# Patient Record
Sex: Female | Born: 1975 | Race: White | Hispanic: No | Marital: Married | Smoking: Never smoker
Health system: Southern US, Community
[De-identification: ages and names within clinical notes are randomized; demographics above are authoritative.]

## PROBLEM LIST (undated history)

## (undated) DIAGNOSIS — K219 Gastro-esophageal reflux disease without esophagitis: Secondary | ICD-10-CM

## (undated) DIAGNOSIS — F329 Major depressive disorder, single episode, unspecified: Secondary | ICD-10-CM

## (undated) DIAGNOSIS — F32A Depression, unspecified: Secondary | ICD-10-CM

---

## 2007-04-09 ENCOUNTER — Other Ambulatory Visit: Admission: RE | Admit: 2007-04-09 | Discharge: 2007-04-09 | Payer: Self-pay | Admitting: Obstetrics and Gynecology

## 2007-07-09 ENCOUNTER — Encounter: Admission: RE | Admit: 2007-07-09 | Discharge: 2007-07-09 | Payer: Self-pay | Admitting: Obstetrics and Gynecology

## 2008-05-15 ENCOUNTER — Other Ambulatory Visit: Admission: RE | Admit: 2008-05-15 | Discharge: 2008-05-15 | Payer: Self-pay | Admitting: Obstetrics and Gynecology

## 2009-05-20 ENCOUNTER — Other Ambulatory Visit: Admission: RE | Admit: 2009-05-20 | Discharge: 2009-05-20 | Payer: Self-pay | Admitting: Obstetrics and Gynecology

## 2010-05-24 ENCOUNTER — Other Ambulatory Visit: Admission: RE | Admit: 2010-05-24 | Discharge: 2010-05-24 | Payer: Self-pay | Admitting: Obstetrics and Gynecology

## 2015-09-01 MED FILL — FETZIMA ER 120 MG CAPSULE: 120 | 30 days supply | Qty: 30 | Fill #2

## 2015-09-01 MED FILL — traZODone HCL 50 MG TABS: 50 | 30 days supply | Qty: 60 | Fill #0

## 2015-09-20 MED FILL — BUPROPION HCL XL 150 MG TAB: 150 | 30 days supply | Qty: 30 | Fill #0

## 2015-10-04 MED FILL — FETZIMA ER 120 MG CAPSULE: 120 | 30 days supply | Qty: 30 | Fill #3

## 2015-10-19 MED FILL — BUPROPION HCL XL 150 MG TAB: 150 | 30 days supply | Qty: 30 | Fill #1

## 2015-10-26 DIAGNOSIS — F331 Major depressive disorder, recurrent, moderate: Secondary | ICD-10-CM | POA: Diagnosis not present

## 2015-10-26 DIAGNOSIS — F900 Attention-deficit hyperactivity disorder, predominantly inattentive type: Secondary | ICD-10-CM | POA: Diagnosis not present

## 2015-10-26 DIAGNOSIS — F422 Mixed obsessional thoughts and acts: Secondary | ICD-10-CM | POA: Diagnosis not present

## 2015-10-29 DIAGNOSIS — E039 Hypothyroidism, unspecified: Secondary | ICD-10-CM | POA: Diagnosis not present

## 2015-11-02 MED FILL — traZODone HCL 50 MG TABS: 50 | 30 days supply | Qty: 60 | Fill #1

## 2015-11-02 MED FILL — FETZIMA ER 120 MG CAPSULE: 120 | 30 days supply | Qty: 30 | Fill #0

## 2015-11-09 DIAGNOSIS — H52223 Regular astigmatism, bilateral: Secondary | ICD-10-CM | POA: Diagnosis not present

## 2015-11-09 DIAGNOSIS — H5211 Myopia, right eye: Secondary | ICD-10-CM | POA: Diagnosis not present

## 2015-11-16 DIAGNOSIS — Z6827 Body mass index (BMI) 27.0-27.9, adult: Secondary | ICD-10-CM | POA: Diagnosis not present

## 2015-11-16 DIAGNOSIS — Z01419 Encounter for gynecological examination (general) (routine) without abnormal findings: Secondary | ICD-10-CM | POA: Diagnosis not present

## 2015-11-17 MED FILL — TRANSDERM-SCOP 1.5 MG/72HR: 1 | 9 days supply | Qty: 3 | Fill #0

## 2015-11-18 MED FILL — BUPROPION HCL XL 150 MG TAB: 150 | 30 days supply | Qty: 30 | Fill #2

## 2015-11-25 DIAGNOSIS — Z1329 Encounter for screening for other suspected endocrine disorder: Secondary | ICD-10-CM | POA: Diagnosis not present

## 2015-11-25 DIAGNOSIS — Z1321 Encounter for screening for nutritional disorder: Secondary | ICD-10-CM | POA: Diagnosis not present

## 2015-11-25 DIAGNOSIS — Z1231 Encounter for screening mammogram for malignant neoplasm of breast: Secondary | ICD-10-CM | POA: Diagnosis not present

## 2015-11-25 DIAGNOSIS — Z131 Encounter for screening for diabetes mellitus: Secondary | ICD-10-CM | POA: Diagnosis not present

## 2015-11-25 DIAGNOSIS — Z13220 Encounter for screening for lipoid disorders: Secondary | ICD-10-CM | POA: Diagnosis not present

## 2015-11-25 MED FILL — FETZIMA ER 120 MG CAPSULE: 120 | 30 days supply | Qty: 30 | Fill #1

## 2015-12-22 DIAGNOSIS — K6289 Other specified diseases of anus and rectum: Secondary | ICD-10-CM | POA: Diagnosis not present

## 2015-12-22 MED FILL — BUPROPION HCL XL 150 MG TAB: 150 | 30 days supply | Qty: 30 | Fill #3

## 2015-12-30 MED FILL — FETZIMA ER 120 MG CAPSULE: 120 | 30 days supply | Qty: 30 | Fill #2

## 2015-12-30 MED FILL — traZODone HCL 50 MG TABS: 50 | 30 days supply | Qty: 60 | Fill #2

## 2016-01-18 DIAGNOSIS — L700 Acne vulgaris: Secondary | ICD-10-CM | POA: Diagnosis not present

## 2016-01-18 DIAGNOSIS — L821 Other seborrheic keratosis: Secondary | ICD-10-CM | POA: Diagnosis not present

## 2016-01-18 MED FILL — metroNIDAZOLE 1 % GEL: 1 | 30 days supply | Qty: 60 | Fill #0

## 2016-01-18 MED FILL — AMPICILLIN TR 500 MG CAP: 500 | 30 days supply | Qty: 60 | Fill #0

## 2016-01-25 MED FILL — BUPROPION HCL XL 150 MG TAB: 150 | 30 days supply | Qty: 30 | Fill #0

## 2016-02-03 DIAGNOSIS — F900 Attention-deficit hyperactivity disorder, predominantly inattentive type: Secondary | ICD-10-CM | POA: Diagnosis not present

## 2016-02-03 DIAGNOSIS — F331 Major depressive disorder, recurrent, moderate: Secondary | ICD-10-CM | POA: Diagnosis not present

## 2016-02-03 DIAGNOSIS — F422 Mixed obsessional thoughts and acts: Secondary | ICD-10-CM | POA: Diagnosis not present

## 2016-02-16 MED FILL — AMPICILLIN TR 500 MG CAP: 500 | 30 days supply | Qty: 60 | Fill #1

## 2016-02-16 MED FILL — traZODone HCL 50 MG TABS: 50 | 30 days supply | Qty: 60 | Fill #3

## 2016-02-23 MED FILL — BUPROPION HCL XL 150 MG TAB: 150 | 30 days supply | Qty: 30 | Fill #1

## 2016-03-23 MED FILL — BUPROPION HCL XL 150 MG TAB: 150 | 30 days supply | Qty: 30 | Fill #2

## 2016-04-18 MED FILL — SCOPOLAMINE 1 MG/3 DAY PATC: 1 | 12 days supply | Qty: 4 | Fill #0

## 2016-04-19 MED FILL — traZODone HCL 50 MG TABS: 50 | 30 days supply | Qty: 60 | Fill #0

## 2016-04-19 MED FILL — BUPROPION HCL XL 150 MG TAB: 150 | 30 days supply | Qty: 30 | Fill #3

## 2016-04-19 MED FILL — FETZIMA ER 120 MG CAPSULE: 120 | 30 days supply | Qty: 30 | Fill #3

## 2016-05-02 DIAGNOSIS — L603 Nail dystrophy: Secondary | ICD-10-CM | POA: Diagnosis not present

## 2016-05-12 MED FILL — DICLOFENAC SODIUM 1% GEL: 1 | 20 days supply | Qty: 100 | Fill #0

## 2016-05-16 DIAGNOSIS — L700 Acne vulgaris: Secondary | ICD-10-CM | POA: Diagnosis not present

## 2016-05-16 DIAGNOSIS — D2371 Other benign neoplasm of skin of right lower limb, including hip: Secondary | ICD-10-CM | POA: Diagnosis not present

## 2016-05-16 DIAGNOSIS — D2372 Other benign neoplasm of skin of left lower limb, including hip: Secondary | ICD-10-CM | POA: Diagnosis not present

## 2016-05-16 DIAGNOSIS — D2261 Melanocytic nevi of right upper limb, including shoulder: Secondary | ICD-10-CM | POA: Diagnosis not present

## 2016-05-16 DIAGNOSIS — D225 Melanocytic nevi of trunk: Secondary | ICD-10-CM | POA: Diagnosis not present

## 2016-05-16 DIAGNOSIS — D2272 Melanocytic nevi of left lower limb, including hip: Secondary | ICD-10-CM | POA: Diagnosis not present

## 2016-05-16 DIAGNOSIS — L812 Freckles: Secondary | ICD-10-CM | POA: Diagnosis not present

## 2016-05-16 DIAGNOSIS — D2361 Other benign neoplasm of skin of right upper limb, including shoulder: Secondary | ICD-10-CM | POA: Diagnosis not present

## 2016-05-16 DIAGNOSIS — D2262 Melanocytic nevi of left upper limb, including shoulder: Secondary | ICD-10-CM | POA: Diagnosis not present

## 2016-05-16 MED FILL — SPIRONOLACTONE 25 MG TABLET: 25 | 37 days supply | Qty: 60 | Fill #0

## 2016-05-17 DIAGNOSIS — F331 Major depressive disorder, recurrent, moderate: Secondary | ICD-10-CM | POA: Diagnosis not present

## 2016-05-17 DIAGNOSIS — F900 Attention-deficit hyperactivity disorder, predominantly inattentive type: Secondary | ICD-10-CM | POA: Diagnosis not present

## 2016-05-17 DIAGNOSIS — F422 Mixed obsessional thoughts and acts: Secondary | ICD-10-CM | POA: Diagnosis not present

## 2016-06-19 MED FILL — traZODone HCL 50 MG TABS: 50 | 30 days supply | Qty: 60 | Fill #1

## 2016-06-19 MED FILL — SPIRONOLACTONE 25 MG TABLET: 25 | 37 days supply | Qty: 60 | Fill #1

## 2016-06-19 MED FILL — BUPROPION HCL XL 150 MG TAB: 150 | 30 days supply | Qty: 30 | Fill #0

## 2016-06-19 MED FILL — FETZIMA ER 120 MG CAPSULE: 120 | 30 days supply | Qty: 30 | Fill #0

## 2016-07-20 DIAGNOSIS — F900 Attention-deficit hyperactivity disorder, predominantly inattentive type: Secondary | ICD-10-CM | POA: Diagnosis not present

## 2016-07-20 DIAGNOSIS — F331 Major depressive disorder, recurrent, moderate: Secondary | ICD-10-CM | POA: Diagnosis not present

## 2016-07-20 DIAGNOSIS — F422 Mixed obsessional thoughts and acts: Secondary | ICD-10-CM | POA: Diagnosis not present

## 2016-07-24 MED FILL — SPIRONOLACTONE 25 MG TABLET: 25 | 37 days supply | Qty: 60 | Fill #2

## 2016-07-24 MED FILL — FETZIMA ER 120 MG CAPSULE: 120 | 30 days supply | Qty: 30 | Fill #1

## 2016-07-24 MED FILL — BUPROPION HCL XL 150 MG TAB: 150 | 30 days supply | Qty: 30 | Fill #1

## 2016-08-24 ENCOUNTER — Ambulatory Visit (INDEPENDENT_AMBULATORY_CARE_PROVIDER_SITE_OTHER): Payer: Self-pay | Admitting: Sports Medicine

## 2016-08-24 ENCOUNTER — Ambulatory Visit (INDEPENDENT_AMBULATORY_CARE_PROVIDER_SITE_OTHER): Payer: Self-pay

## 2016-08-24 ENCOUNTER — Encounter (INDEPENDENT_AMBULATORY_CARE_PROVIDER_SITE_OTHER): Payer: Self-pay

## 2016-08-24 ENCOUNTER — Encounter (INDEPENDENT_AMBULATORY_CARE_PROVIDER_SITE_OTHER): Payer: Self-pay | Admitting: Orthopaedic Surgery

## 2016-08-24 ENCOUNTER — Ambulatory Visit (INDEPENDENT_AMBULATORY_CARE_PROVIDER_SITE_OTHER): Payer: 59 | Admitting: Orthopaedic Surgery

## 2016-08-24 DIAGNOSIS — M79672 Pain in left foot: Secondary | ICD-10-CM | POA: Diagnosis not present

## 2016-08-24 DIAGNOSIS — M25521 Pain in right elbow: Secondary | ICD-10-CM | POA: Diagnosis not present

## 2016-08-24 DIAGNOSIS — M79671 Pain in right foot: Secondary | ICD-10-CM | POA: Diagnosis not present

## 2016-08-24 MED ORDER — LIDOCAINE HCL 1 % IJ SOLN
1.0000 mL | INTRAMUSCULAR | Status: AC | PRN
  Administered 2016-08-24: 1 mL

## 2016-08-24 MED ORDER — DICLOFENAC SODIUM 1 % TD GEL: 2.0000 g | Freq: Four times a day (QID) | TRANSDERMAL | 5 refills | Status: DC

## 2016-08-24 MED ORDER — METHYLPREDNISOLONE ACETATE 40 MG/ML IJ SUSP
40.0000 mg | INTRAMUSCULAR | Status: AC | PRN
  Administered 2016-08-24: 40 mg

## 2016-08-24 MED ORDER — BUPIVACAINE HCL 0.5 % IJ SOLN
1.0000 mL | INTRAMUSCULAR | Status: AC | PRN
  Administered 2016-08-24: 1 mL

## 2016-08-24 MED FILL — BUPROPION HCL XL 150 MG TAB: 150 | 30 days supply | Qty: 30 | Fill #2

## 2016-08-24 MED FILL — traZODone HCL 50 MG TABS: 50 | 30 days supply | Qty: 60 | Fill #2

## 2016-08-24 NOTE — Progress Notes (Signed)
Office Visit Note   Patient: Robin Stevenson           Date of Birth: 05-19-1976           MRN: [id number] Visit Date: 08/24/2016              Requested by: No referring provider defined for this encounter. PCP: Robin Shelling, MD   Assessment & Plan: Visit Diagnoses:  1. Pain in right elbow   2. 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 We did perform an injection today under sterile conditions. I also gave her information to read about the condition. I want her to take 4 weeks off from tennis. Also want her to look into using a smaller grip size with a racquet. Continue with ice. Wears a body helix elbow brace. In terms of her peroneal tubercles I think the only treatment that would be long lasting with be for surgical resection. I think she should try to manage it with shoe modification and with padding which she is more interested in anyways. She does not want to pursue any sort of surgical treatment. voltaren gel was also prescribed for the elbow. I will see her back as needed. Total face to face encounter time was greater than 45 minutes and over half of this time was spent in counseling and/or coordination of care.    Follow-Up Instructions: No Follow-up on file.   Orders:  Orders Placed This Encounter  Procedures  . XR Elbow 2 Views Right   Meds ordered this encounter  Medications  . diclofenac sodium (VOLTAREN) 1 % GEL    Sig: Apply 2 g topically 4 (four) times daily.    Dispense:  1 Tube    Refill:  5      Procedures: Hand/UE Inj Date/Time: 08/24/2016 9:00 PM Performed by: Leandrew Koyanagi Authorized by: Leandrew Koyanagi   Consent Given by:  Patient Timeout: prior to procedure the correct patient, procedure, and site was verified   Indications:  Pain Condition: lateral epicondylitis   Site:  R elbow Prep: patient was prepped and draped in usual sterile fashion   Needle Size:  25 G Medications:   1 mL lidocaine 1 %; 1 mL bupivacaine 0.5 %; 40 mg methylPREDNISolone acetate 40 MG/ML     Clinical Data: No additional findings.   Subjective: Chief Complaint  Patient presents with  . Right Elbow - Pain  . Right Foot - Pain  . Left Foot - Pain    The patient is a 41 year old female who is the wife of Robin Stevenson of cardiology comes in for multiple complaints. First one is right tennis elbow. She has had this problem for 2-3 years and has had recent worsening in the last 3-4 weeks. She is an avid Firefighter. She is had good relief from dry needling with physical therapy  is not had any injections. She states the pain is worse with playing tennis. She did switch to a heavier racquet recently. In terms of her feet she has prominent bilateral peroneal tubercles which do make it irritating to wear shoes. They are sore and they are slightly enlarged. It's worse on the right. The pain does not radiate. Denies any injuries. Denies any ulcers.      Review of Systems Complete review of systems is negative except for history of present illness  Objective: Vital Signs: There were no vitals taken for this  visit.  Physical Exam Well-developed nourished acute distress alert 3 nonlabored breathing normal judgments affect Ortho Exam Exam of her right elbow shows tenderness to palpation over the lateral upper condyle. Negative Tinel at radial tunnel. Radial tunnel is nontender. No focal motor or sensory deficits. Pain with resisted wrist extension. No pain with resisted middle finger extension. No medial sided elbow symptoms. Full range of motion of the elbow. Exam of bilateral feet shows prominent peroneal tubercles. Peroneal tendons are nontender. There are slightly red from mechanical irritation. Specialty Comments:  No specialty comments available.  Imaging: Xr Elbow 2 Views Right  Result Date: 08/24/2016 No acute findings    PMFS History: There are no active problems to display for  this patient.  No past medical history on file.  No family history on file.  No past surgical history on file. Social History   Occupational History  . Not on file.   Social History Main Topics  . Smoking status: Never Smoker  . Smokeless tobacco: Never Used  . Alcohol use Not on file  . Drug use: Unknown  . Sexual activity: Not on file

## 2016-08-25 MED FILL — FETZIMA ER 120 MG CAPSULE: 120 | 30 days supply | Qty: 30 | Fill #2

## 2016-08-31 ENCOUNTER — Telehealth: Payer: Self-pay | Admitting: Gastroenterology

## 2016-08-31 DIAGNOSIS — R112 Nausea with vomiting, unspecified: Secondary | ICD-10-CM

## 2016-08-31 DIAGNOSIS — R1013 Epigastric pain: Secondary | ICD-10-CM

## 2016-08-31 NOTE — Telephone Encounter (Signed)
Eustaquio Maize is a friend of mine.  She had a gastroenteritis, seems viral, last week. Last night colicy epigastric abd pains, radiated to the back, + nausea and some diaphoresis.  Mainly better by this morning but some lingering discomfort. Takes NSAIDs around her period which was about a week ago.    She's going to take H2 blocker tonight at bedtime, PPI in AM.  I'm concerned about possible biliary colic.  Sheri, She's coming to the lab tomorrow AM (friday); can you put her in for cbc, cmet, lipase, amylase.  Also [place] abd (hopefully tomorrow), she's going to remain relatively NPO in case [place] can be tomorrow.  Thanks  Her cell is [phone]

## 2016-09-01 ENCOUNTER — Other Ambulatory Visit (INDEPENDENT_AMBULATORY_CARE_PROVIDER_SITE_OTHER): Payer: 59

## 2016-09-01 ENCOUNTER — Ambulatory Visit (HOSPITAL_COMMUNITY)
Admission: RE | Admit: 2016-09-01 | Discharge: 2016-09-01 | Disposition: A | Discharge status: Home / Self Care | Payer: 59 | Source: Ambulatory Visit | Attending: Gastroenterology | Admitting: Gastroenterology

## 2016-09-01 DIAGNOSIS — R1013 Epigastric pain: Secondary | ICD-10-CM | POA: Diagnosis not present

## 2016-09-01 DIAGNOSIS — R112 Nausea with vomiting, unspecified: Secondary | ICD-10-CM | POA: Diagnosis not present

## 2016-09-01 LAB — CBC WITH DIFFERENTIAL/PLATELET
Basophils Absolute: 0 10*3/uL (ref 0.0–0.1)
Basophils Relative: 0.7 % (ref 0.0–3.0)
Eosinophils Absolute: 0.1 10*3/uL (ref 0.0–0.7)
Eosinophils Relative: 2 % (ref 0.0–5.0)
HCT: 38.9 % (ref 36.0–46.0)
Hemoglobin: 13.5 g/dL (ref 12.0–15.0)
Lymphocytes Relative: 29.2 % (ref 12.0–46.0)
Lymphs Abs: 1.5 10*3/uL (ref 0.7–4.0)
MCHC: 34.6 g/dL (ref 30.0–36.0)
MCV: 90.4 fl (ref 78.0–100.0)
Monocytes Absolute: 0.4 10*3/uL (ref 0.1–1.0)
Monocytes Relative: 7.6 % (ref 3.0–12.0)
Neutro Abs: 3.1 10*3/uL (ref 1.4–7.7)
Neutrophils Relative %: 60.5 % (ref 43.0–77.0)
Platelets: 286 10*3/uL (ref 150.0–400.0)
RBC: 4.3 Mil/uL (ref 3.87–5.11)
RDW: 11.9 % (ref 11.5–15.5)
WBC: 5.1 10*3/uL (ref 4.0–10.5)

## 2016-09-01 LAB — COMPREHENSIVE METABOLIC PANEL
ALT: 70 U/L — ABNORMAL HIGH (ref 0–35)
AST: 33 U/L (ref 0–37)
Albumin: 4.3 g/dL (ref 3.5–5.2)
Alkaline Phosphatase: 66 U/L (ref 39–117)
BUN: 14 mg/dL (ref 6–23)
CO2: 31 mEq/L (ref 19–32)
Calcium: 9.2 mg/dL (ref 8.4–10.5)
Chloride: 104 mEq/L (ref 96–112)
Creatinine, Ser: 0.86 mg/dL (ref 0.40–1.20)
GFR: 77.19 mL/min (ref >60.00)
Glucose, Bld: 90 mg/dL (ref 70–99)
Potassium: 4.4 mEq/L (ref 3.5–5.1)
Sodium: 140 mEq/L (ref 135–145)
Total Bilirubin: 0.7 mg/dL (ref 0.2–1.2)
Total Protein: 7 g/dL (ref 6.0–8.3)

## 2016-09-01 LAB — LIPASE: LIPASE: 57 U/L (ref 11.0–59.0)

## 2016-09-01 LAB — AMYLASE: Amylase: 41 U/L (ref 27–131)

## 2016-09-01 NOTE — Telephone Encounter (Signed)
Patient notified of the [place] scheduled for today at [facility] she is notified to arrive at 11:45 and remain NPO

## 2016-09-08 ENCOUNTER — Telehealth: Payer: Self-pay

## 2016-09-08 NOTE — Telephone Encounter (Signed)
Per Dr. Radford Pax, patient has no OSA but significant snoring.  Patient will be referred to Dr. Toy Cookey to evaluate for oral device.  Referral sent to fax :[phone].

## 2016-09-11 ENCOUNTER — Telehealth: Payer: Self-pay | Admitting: *Deleted

## 2016-09-11 ENCOUNTER — Other Ambulatory Visit: Payer: Self-pay

## 2016-09-11 DIAGNOSIS — R945 Abnormal results of liver function studies: Secondary | ICD-10-CM

## 2016-09-11 DIAGNOSIS — R7989 Other specified abnormal findings of blood chemistry: Secondary | ICD-10-CM

## 2016-09-11 NOTE — Telephone Encounter (Signed)
I called dr Betsey Holiday office and spoke to [place] to check on the referral from dr turner for her oral device, the office informed me they got the referral today and were waiting on the patient to call and make her appointment. 09/11/16

## 2016-09-11 NOTE — Telephone Encounter (Signed)
I called dr Betsey Holiday office and spoke to [place] to check on the referral from dr turner for her oral device, the office informed me they got the referral today and were waiting on the patient to call and make her appointment. 09/11/16

## 2016-09-27 MED FILL — OSELTAMIVIR PHOS 75 MG CAP: 75 | 5 days supply | Qty: 10 | Fill #0

## 2016-09-28 MED FILL — BUPROPION HCL XL 150 MG TAB: 150 | 30 days supply | Qty: 30 | Fill #3

## 2016-09-28 MED FILL — FETZIMA ER 120 MG CAPSULE: 120 | 30 days supply | Qty: 30 | Fill #3

## 2016-10-19 DIAGNOSIS — F422 Mixed obsessional thoughts and acts: Secondary | ICD-10-CM | POA: Diagnosis not present

## 2016-10-19 DIAGNOSIS — F331 Major depressive disorder, recurrent, moderate: Secondary | ICD-10-CM | POA: Diagnosis not present

## 2016-10-19 DIAGNOSIS — F900 Attention-deficit hyperactivity disorder, predominantly inattentive type: Secondary | ICD-10-CM | POA: Diagnosis not present

## 2016-10-26 MED FILL — BUPROPION HCL XL 150 MG TAB: 150 | 30 days supply | Qty: 30 | Fill #0

## 2016-10-26 MED FILL — FETZIMA ER 120 MG CAPSULE: 120 | 30 days supply | Qty: 30 | Fill #0

## 2016-11-02 MED FILL — traZODone HCL 50 MG TABS: 50 | 30 days supply | Qty: 60 | Fill #0

## 2016-11-27 MED FILL — BUPROPION HCL XL 150 MG TAB: 150 | 30 days supply | Qty: 30 | Fill #1

## 2016-11-27 MED FILL — FETZIMA ER 120 MG CAPSULE: 120 | 30 days supply | Qty: 30 | Fill #1

## 2016-11-30 DIAGNOSIS — Z1231 Encounter for screening mammogram for malignant neoplasm of breast: Secondary | ICD-10-CM | POA: Diagnosis not present

## 2016-11-30 DIAGNOSIS — Z6829 Body mass index (BMI) 29.0-29.9, adult: Secondary | ICD-10-CM | POA: Diagnosis not present

## 2016-11-30 DIAGNOSIS — Z01419 Encounter for gynecological examination (general) (routine) without abnormal findings: Secondary | ICD-10-CM | POA: Diagnosis not present

## 2016-11-30 MED FILL — CYCLOBENZAPRINE 5 MG TABLET: 5 | 5 days supply | Qty: 20 | Fill #0

## 2016-12-04 ENCOUNTER — Encounter (INDEPENDENT_AMBULATORY_CARE_PROVIDER_SITE_OTHER): Payer: Self-pay | Admitting: Orthopaedic Surgery

## 2016-12-04 ENCOUNTER — Ambulatory Visit (INDEPENDENT_AMBULATORY_CARE_PROVIDER_SITE_OTHER): Payer: 59 | Admitting: Orthopaedic Surgery

## 2016-12-04 DIAGNOSIS — M7711 Lateral epicondylitis, right elbow: Secondary | ICD-10-CM | POA: Insufficient documentation

## 2016-12-04 MED ORDER — METHOCARBAMOL 500 MG PO TABS
500.0000 mg | ORAL_TABLET | Freq: Four times a day (QID) | ORAL | 2 refills | Status: DC | PRN
Start: 2016-12-04 — End: 2017-04-30

## 2016-12-04 MED ORDER — METHYLPREDNISOLONE ACETATE 40 MG/ML IJ SUSP
40.0000 mg | INTRAMUSCULAR | Status: AC | PRN
  Administered 2016-12-04: 40 mg

## 2016-12-04 MED ORDER — BUPIVACAINE HCL 0.5 % IJ SOLN
1.0000 mL | INTRAMUSCULAR | Status: AC | PRN
  Administered 2016-12-04: 1 mL

## 2016-12-04 MED ORDER — LIDOCAINE HCL 1 % IJ SOLN
1.0000 mL | INTRAMUSCULAR | Status: AC | PRN
  Administered 2016-12-04: 1 mL

## 2016-12-04 MED FILL — METHOCARBAMOL 500 MG TABLET: 500 | 8 days supply | Qty: 30 | Fill #0

## 2016-12-04 NOTE — Progress Notes (Signed)
   Office Visit Note   Patient: Robin Stevenson           Date of Birth: 05/16/1976           MRN: [id number] Visit Date: 12/04/2016              Requested by: Robin Orn, MD [address] [place], [place] [address] PCP: Robin Shelling, MD   Assessment & Plan: Visit Diagnoses:  1. Lateral epicondylitis, right elbow     Plan: Repeat cortisone injection was given into the lateral upper condyle and the extensor tendon attachment. I recommended relative rest. Pennsaid sample was given today. I did give her prescription for Robaxin to see if this will be less sedating for her. Continue ice and NSAIDs especially around the time she is playing tennis. Follow-up with me as needed.  Follow-Up Instructions: Return if symptoms worsen or fail to improve.   Orders:  No orders of the defined types were placed in this encounter.  Meds ordered this encounter  Medications  . methocarbamol (ROBAXIN) 500 MG tablet    Sig: Take 1 tablet (500 mg total) by mouth every 6 (six) hours as needed for muscle spasms.    Dispense:  30 tablet    Refill:  2      Procedures: Hand/UE Inj Date/Time: 12/04/2016 4:58 PM Performed by: Robin Stevenson Authorized by: Robin Stevenson   Consent Given by:  Patient Timeout: prior to procedure the correct patient, procedure, and site was verified   Indications:  Pain Condition: lateral epicondylitis   Site:  R elbow Prep: patient was prepped and draped in usual sterile fashion   Needle Size:  25 G Medications:  1 mL lidocaine 1 %; 1 mL bupivacaine 0.5 %; 40 mg methylPREDNISolone acetate 40 MG/ML     Clinical Data: No additional findings.   Subjective: Chief Complaint  Patient presents with  . Right Elbow - Pain, Follow-up    [name] follows up today for her right elbow pain. She did get an injection about 3 months ago and has helped tremendously. She still complains of just one area of pain directly over the lateral epicondyle. She  denies any pain into her forearm anymore. She wears a body helix elbow brace while playing tennis. She does take anti-inflammatories as needed. She is also complaining of some back pain. Flexeril makes her too drowsy.    Review of Systems   Objective: Vital Signs: There were no vitals taken for this visit.  Physical Exam  Ortho Exam Right elbow exam shows point tenderness over the lateral epicondyle. Radial head is nontender. He has full pronation supination. Collateral ligaments are normal. Specialty Comments:  No specialty comments available.  Imaging: No results found.   PMFS History: Patient Active Problem List   Diagnosis Date Noted  . Lateral epicondylitis, right elbow 12/04/2016   No past medical history on file.  No family history on file.  No past surgical history on file. Social History   Occupational History  . Not on file.   Social History Main Topics  . Smoking status: Never Smoker  . Smokeless tobacco: Never Used  . Alcohol use Not on file  . Drug use: Unknown  . Sexual activity: Not on file

## 2016-12-27 ENCOUNTER — Ambulatory Visit (INDEPENDENT_AMBULATORY_CARE_PROVIDER_SITE_OTHER): Payer: 59

## 2016-12-27 ENCOUNTER — Ambulatory Visit (INDEPENDENT_AMBULATORY_CARE_PROVIDER_SITE_OTHER): Payer: 59 | Admitting: Family

## 2016-12-27 DIAGNOSIS — M25521 Pain in right elbow: Secondary | ICD-10-CM

## 2016-12-27 DIAGNOSIS — M7711 Lateral epicondylitis, right elbow: Secondary | ICD-10-CM | POA: Diagnosis not present

## 2016-12-27 MED FILL — FETZIMA ER 120 MG CAPSULE: 120 | 30 days supply | Qty: 30 | Fill #2

## 2016-12-27 MED FILL — BUPROPION HCL XL 150 MG TAB: 150 | 30 days supply | Qty: 30 | Fill #2

## 2016-12-27 MED FILL — traZODone HCL 50 MG TABS: 50 | 30 days supply | Qty: 60 | Fill #1

## 2016-12-27 NOTE — Progress Notes (Signed)
Office Visit Note   Patient: Robin Stevenson           Date of Birth: [birth year]           MRN: [id number] Visit Date: 12/27/2016              Requested by: Lavone Orn, MD [address] [place], [place] [address] PCP: Lavone Orn, MD  No chief complaint on file.     HPI: The patient is a 41 year old woman who presents today for complaining of right elbow pain. States that she has had lateral epicondylitis, tennis elbow for the last 4 years off and on. Had a steroid injection in January of 1 shoe this provided good interval relief. However did have return of her symptoms. She had a subsequent steroid injection in April. States this did not provide as much relief as the last injection. States yesterday she was playing tennis when she did a backhand. Did use both hands. States this is normally her strongest move. Him felt something pop and heard a pop in her right elbow. Has complained of weakness with weightbearing and extension of her wrist since yesterday. States she was able to finish her match.  Assessment & Plan: Visit Diagnoses:  1. Lateral epicondylitis, right elbow   2. Pain in right elbow     Plan: Him will proceed with MRI of the right shoulder. Radiographs today were reassuring. She will continue with ibuprofen as needed for pain. We will hold off on tennis until follow-up.  Follow-Up Instructions: Return for Post MRI with Dr. Sherrian Divers.   Right Hand Exam   Range of Motion   Wrist  Extension: normal Right wrist extension: resisted extension not painful.   Muscle Strength  Grip: 5/5   Other  Erythema: absent Pulse: present   Right Elbow Exam   Tenderness  The patient is experiencing tenderness in the lateral epicondyle.   Range of Motion  The patient has normal right elbow ROM.  Muscle Strength  The patient has normal right elbow strength.  Other  Erythema: absent  Comments:  No appreciable swelling.      Patient is alert,  oriented, no adenopathy, well-dressed, normal affect, normal respiratory effort.   Imaging: No results found.  Labs: No results found for: HGBA1C, ESRSEDRATE, CRP, LABURIC, REPTSTATUS, GRAMSTAIN, CULT, LABORGA  Orders:  Orders Placed This Encounter  Procedures  . XR Elbow Complete Right (3+View)  . MR Elbow Right w/ contrast   No orders of the defined types were placed in this encounter.    Procedures: No procedures performed  Clinical Data: No additional findings.  ROS:  All other systems negative, except as noted in the HPI. Review of Systems  Constitutional: Negative for chills and fever.  Musculoskeletal: Positive for arthralgias and myalgias.  Neurological: Positive for weakness.    Objective: Vital Signs: There were no vitals taken for this visit.  Specialty Comments:  No specialty comments available.  PMFS History: Patient Active Problem List   Diagnosis Date Noted  . Lateral epicondylitis, right elbow 12/04/2016   No past medical history on file.  No family history on file.  No past surgical history on file. Social History   Occupational History  . Not on file.   Social History Main Topics  . Smoking status: Never Smoker  . Smokeless tobacco: Never Used  . Alcohol use Not on file  . Drug use: Unknown  . Sexual activity: Not on file

## 2017-01-07 ENCOUNTER — Ambulatory Visit
Admission: RE | Admit: 2017-01-07 | Discharge: 2017-01-07 | Disposition: A | Discharge status: Home / Self Care | Payer: 59 | Source: Ambulatory Visit | Attending: Family | Admitting: Family

## 2017-01-07 DIAGNOSIS — M7711 Lateral epicondylitis, right elbow: Secondary | ICD-10-CM

## 2017-01-07 DIAGNOSIS — M63821 Disorders of muscle in diseases classified elsewhere, right upper arm: Secondary | ICD-10-CM | POA: Diagnosis not present

## 2017-01-07 DIAGNOSIS — R600 Localized edema: Secondary | ICD-10-CM | POA: Diagnosis not present

## 2017-01-09 ENCOUNTER — Encounter (INDEPENDENT_AMBULATORY_CARE_PROVIDER_SITE_OTHER): Payer: Self-pay | Admitting: Orthopaedic Surgery

## 2017-01-09 ENCOUNTER — Ambulatory Visit (INDEPENDENT_AMBULATORY_CARE_PROVIDER_SITE_OTHER): Payer: 59 | Admitting: Orthopaedic Surgery

## 2017-01-09 DIAGNOSIS — M7711 Lateral epicondylitis, right elbow: Secondary | ICD-10-CM | POA: Diagnosis not present

## 2017-01-09 MED ORDER — NITROGLYCERIN 0.2 MG/HR TD PT24: MEDICATED_PATCH | TRANSDERMAL | 12 refills | Status: DC

## 2017-01-09 MED FILL — NITROGLYCERIN 0.2 MG/HR PTC: 0.2 | 90 days supply | Qty: 22 | Fill #0

## 2017-01-09 NOTE — Progress Notes (Signed)
Office Visit Note   Patient: Robin Stevenson           Date of Birth: 1975/10/29           MRN: [id number] Visit Date: 01/09/2017              Requested by: Lavone Orn, MD [address] [place], [place] [address] PCP: Lavone Orn, MD   Assessment & Plan: Visit Diagnoses:  1. Lateral epicondylitis, right elbow     Plan: I counseled her about prolonged rest to get this to calm down. Her MRI shows mild tendinopathy with edema tracking along the supinator and the rest of the extensor mobile wad.  Clinically speaking she is doing much better now. We'll try nitroglycerin patches to the area to see if this will speed up healing. I do want her to rest from tennis for a full 12 weeks. She will slowly increase her activity with tennis after this time. Follow-up with me as needed. Russians encouraged and answered.  Follow-Up Instructions: Return if symptoms worsen or fail to improve.   Orders:  No orders of the defined types were placed in this encounter.  Meds ordered this encounter  Medications  . nitroGLYCERIN (NITRODUR - DOSED IN MG/24 HR) 0.2 mg/hr patch    Sig: Place 1/4 patch to affected area for 12 hours then leave off for 12 hours.    Dispense:  30 patch    Refill:  12      Procedures: No procedures performed   Clinical Data: No additional findings.   Subjective: Chief Complaint  Patient presents with  . Right Elbow - Pain    [name] comes back today to review her MRI. She states that she no longer has any pain. She did have intense pain for about 3-4 days after her injury. This is been about 3 weeks now. She is able to do ADLs just fine.    Review of Systems  Constitutional: Negative.   HENT: Negative.   Eyes: Negative.   Respiratory: Negative.   Cardiovascular: Negative.   Endocrine: Negative.   Musculoskeletal: Negative.   Neurological: Negative.   Hematological: Negative.   Psychiatric/Behavioral: Negative.   All other systems  reviewed and are negative.    Objective: Vital Signs: There were no vitals taken for this visit.  Physical Exam  Constitutional: She is oriented to person, place, and time. She appears well-developed and well-nourished.  Pulmonary/Chest: Effort normal.  Neurological: She is alert and oriented to person, place, and time.  Skin: Skin is warm. Capillary refill takes less than 2 seconds.  Psychiatric: She has a normal mood and affect. Her behavior is normal. Judgment and thought content normal.  Nursing note and vitals reviewed.   Ortho Exam Right elbow exam shows no significant tenderness of the lateral epicondyle. She has no discomfort with biceps or brachialis testing. She does not have pain with testing of the ECRB her wrist extension. Specialty Comments:  No specialty comments available.  Imaging: No results found.   PMFS History: Patient Active Problem List   Diagnosis Date Noted  . Lateral epicondylitis, right elbow 12/04/2016   No past medical history on file.  No family history on file.  No past surgical history on file. Social History   Occupational History  . Not on file.   Social History Main Topics  . Smoking status: Never Smoker  . Smokeless tobacco: Never Used  . Alcohol use Not on file  . Drug use: Unknown  .  Sexual activity: Not on file

## 2017-01-11 ENCOUNTER — Ambulatory Visit (INDEPENDENT_AMBULATORY_CARE_PROVIDER_SITE_OTHER): Payer: 59 | Admitting: Sports Medicine

## 2017-01-11 VITALS — BP 124/84 | Ht 66.0 in | Wt 180.0 lb

## 2017-01-11 DIAGNOSIS — M7711 Lateral epicondylitis, right elbow: Secondary | ICD-10-CM

## 2017-01-11 NOTE — Progress Notes (Signed)
Robin Stevenson - 41 y.o. female MRN [id number]  Date of birth: 30-May-1976  SUBJECTIVE:  Including CC & ROS.  CC: right tennis elbow   Robin Stevenson is a 41 yo right-handed female presenting with right lateral elbow pain. She reports that she has had a history of tennis elbow for the past 4-5 years. She plays tennis several times a week. She has mostly managed it with physical therapy, dry needling, and wearing an elbow brace. In January the pain becamse very severe. She saw Dr. Erlinda Hong from [facility], who did 2 cortisone shots. The first one helped relieve pain for 1-2 months, but the second one did not help improve symptoms. On May 8th, she was playing in a Environmental [employer] and felt a pop in her lateral elbow; she finished the match but the pain was excruciating. After this point, she was unable to lift anything secondary to pain. She saw Dr. Erlinda Hong, who obtained an MRI which showed swelling and tendonitis, no tear. He instructed her to rest for 12 weeks and gave her nitroglycerin patches to wear daily. She wore 1/4 nitroglycerin patch yesterday, which caused a severe headache.    ROS: No unexpected weight loss, fever, chills, swelling, instability, muscle pain, numbness/tingling, redness, otherwise see HPI   PMHx - Updated and reviewed.  Contributory factors include: Negative PSHx - Updated and reviewed.  Contributory factors include:  Negative FHx - Updated and reviewed.  Contributory factors include:  Negative Social Hx - Updated and reviewed. Contributory factors include: Negative Medications - reviewed   DATA REVIEWED: MR Elbow from 01/07/17: Mild common extensor tendinopathy without tear. Trace distal biceps tenosynovitis. Subtle edema distally in the brachialis muscle, potentially a mild strain.  PHYSICAL EXAM:  VS: BP:124/84   HT:5\' 6"  (167.6 cm)   WT:180 lb (81.6 kg)  BMI:29.1 PHYSICAL EXAM: Gen: NAD, alert, cooperative with exam, well-appearing HEENT: clear conjunctiva,  CV:   no edema, capillary refill brisk, normal rate Resp: non-labored Skin: no rashes, normal turgor  Neuro: no gross deficits.  Psych:  alert and oriented  MSK:  Right elbow: No effusion, swelling, normal to inspection. Mild TTP over lateral epicondyle. No TTP over medial epicondyle. Full ROM. Good grip strength, 5/5 strength with elbow and wrist flexion, extension. No pain with resisted wrist pronation or supination. No reproducible pain with resisted ECRB testing. Right hand, arm neurovascularly intact.  Limited MSK Ultrasound of R elbow performed. Common extensor tendon well visualized in long axis. Very small, hypoechoic area deep within the tendon along with a small spur off of the lateral epicondyle. Hypoechoic area appears to be edema and not a true tear. Findings consistent with lateral epicondylitis.  ASSESSMENT & PLAN:   Robin Stevenson is a 41 yo female with lateral elbow pain, history of lateral epicondylitis, presenting with acute lateral elbow pain. MRI and ultrasound findings consistent with lateral epicondylitis with no tear noted. Encouraging that her pain has already significantly improved with 2 weeks of rest and that she has no pain with resisted ECRB testing, which also supports the diagnosis of lateral epicondylitis without a tear.   Plan: - continue to rest (plan for 12 weeks total) - use nitroglycerin patches as tolerated (can cut them into 1/8th to see if this improves headache) - given rehabilitation exercises and handout with return to play outline - follow up in 1 month  Sherilyn Banker, MD PGY-1, [facility]  Patient seen and evaluated with the resident. I agree with the above plan of  care. She is already improving. MRI and ultrasound show no definitive tear of the common extensor tendon. Proceed with treatment as outlined above and follow-up with me in one month for reevaluation and repeat ultrasound.

## 2017-01-30 MED FILL — BUPROPION HCL XL 150 MG TAB: 150 | 30 days supply | Qty: 30 | Fill #3

## 2017-01-30 MED FILL — traZODone HCL 50 MG TABS: 50 | 30 days supply | Qty: 60 | Fill #2

## 2017-01-30 MED FILL — FETZIMA ER 120 MG CAPSULE: 120 | 30 days supply | Qty: 30 | Fill #3

## 2017-01-31 DIAGNOSIS — F900 Attention-deficit hyperactivity disorder, predominantly inattentive type: Secondary | ICD-10-CM | POA: Diagnosis not present

## 2017-01-31 DIAGNOSIS — F422 Mixed obsessional thoughts and acts: Secondary | ICD-10-CM | POA: Diagnosis not present

## 2017-01-31 DIAGNOSIS — F331 Major depressive disorder, recurrent, moderate: Secondary | ICD-10-CM | POA: Diagnosis not present

## 2017-02-20 ENCOUNTER — Ambulatory Visit: Payer: 59 | Admitting: Sports Medicine

## 2017-03-02 ENCOUNTER — Ambulatory Visit (INDEPENDENT_AMBULATORY_CARE_PROVIDER_SITE_OTHER): Payer: 59 | Admitting: Internal Medicine

## 2017-03-02 DIAGNOSIS — Z789 Other specified health status: Secondary | ICD-10-CM | POA: Diagnosis not present

## 2017-03-02 DIAGNOSIS — Z23 Encounter for immunization: Secondary | ICD-10-CM | POA: Diagnosis not present

## 2017-03-02 DIAGNOSIS — Z7189 Other specified counseling: Secondary | ICD-10-CM | POA: Diagnosis not present

## 2017-03-02 DIAGNOSIS — Z7184 Encounter for health counseling related to travel: Secondary | ICD-10-CM | POA: Insufficient documentation

## 2017-03-02 DIAGNOSIS — Z9189 Other specified personal risk factors, not elsewhere classified: Secondary | ICD-10-CM

## 2017-03-02 DIAGNOSIS — Z298 Encounter for other specified prophylactic measures: Secondary | ICD-10-CM

## 2017-03-02 MED ORDER — ACETAZOLAMIDE 125 MG PO TABS: 125.0000 mg | ORAL_TABLET | Freq: Two times a day (BID) | ORAL | 0 refills | Status: DC

## 2017-03-02 MED ORDER — CIPROFLOXACIN HCL 500 MG PO TABS: 500.0000 mg | ORAL_TABLET | Freq: Two times a day (BID) | ORAL | 0 refills | Status: DC

## 2017-03-02 MED FILL — BUPROPION HCL XL 150 MG TAB: 150 | 30 days supply | Qty: 30 | Fill #0

## 2017-03-02 MED FILL — FETZIMA ER 120 MG CAPSULE: 120 | 30 days supply | Qty: 30 | Fill #0

## 2017-03-02 MED FILL — acetaZOLAMIDE 125 MG TABS: 125 | 5 days supply | Qty: 10 | Fill #0

## 2017-03-02 MED FILL — CIPROFLOXACIN HCL 500 MG TA: 500 | 4 days supply | Qty: 8 | Fill #0

## 2017-03-02 NOTE — Progress Notes (Signed)
Subjective:   Robin Stevenson is a 41 y.o. female who presents to the Infectious Disease clinic for travel consultation. Planned departure date: March 24, 2017          Planned return date: 10 days Countries of travel: [place] Areas in country: urban   Accommodations: hotel Purpose of travel: vacation Prior travel out of [place]: yes     Objective:   Medications: reviewed    Assessment:   No contraindications to travel. none     Plan:    Issues discussed: environmental concerns, future shots, insect-borne illnesses, malaria, motion sickness, MVA safety, rabies, safe food/water, traveler's diarrhea, website/handouts for more information, what to do if ill upon return, what to do if ill while there and Yellow Fever. Immunizations recommended: Hepatitis A series, Td and Typhoid (parenteral). Malaria prophylaxis: not indicated Traveler's diarrhea prophylaxis: ciprofloxacin. Total duration of visit: 1 Hour. Total time spent on education, counseling, coordination of care: 30 Minutes.

## 2017-03-05 ENCOUNTER — Ambulatory Visit (INDEPENDENT_AMBULATORY_CARE_PROVIDER_SITE_OTHER): Payer: 59 | Admitting: Sports Medicine

## 2017-03-05 DIAGNOSIS — M7711 Lateral epicondylitis, right elbow: Secondary | ICD-10-CM

## 2017-03-05 NOTE — Progress Notes (Signed)
Subjective:    Patient ID: Robin Stevenson , female   DOB: 01-16-1976 , 41 y.o..   MRN: [id number]  HPI  Sherice Ijames is here for:  1. 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 Prior to seeing orthopedics, she had tried managing her symptoms with physical therapy, dry needling, and wearing elbow brace. The pain became so severe that in January 2018 she went to Dr. Erlinda Hong at [facility] and received 2 cortisone injections (One in January 2018 and the second in May 2018) which did not resolve the pain. She again saw Dr. Erlinda Hong and an MRI was performed of her right elbow on 01/07/2017. The MRI showed swelling and tendinitis, no tears. She was instructed to rest for 12 weeks and given nitroglycerin patches to wear daily.  Patient was then seen at our office later on in May and instructed to continue elbow rest, nitroglycerin patches and home rehabilitation exercises.  Since patient was seen last, she states she was pain free until 1 week ago when she went on vacation and was lifting heavy luggage and then the tennis elbow returned. She admits that she has been using a 1/4 or 1/8 Nitropatch every other day (nitroglycerin patches are limited as they cause her headaches sometimes).she has used Advil intermittently without relief. She admits that she has not done any of her home rehabilitation exercises. Patient has been avoiding tennis.    Review of Systems: Per history of present illness   History: Patient Active Problem List   Diagnosis Date Noted  . Travel advice encounter 03/02/2017  . Lateral epicondylitis, right elbow 12/04/2016    Medications: reviewed and updated Current Outpatient Prescriptions  Medication Sig Dispense Refill  . acetaZOLAMIDE (DIAMOX) 125 MG tablet Take 1 tablet (125 mg total) by mouth 2 (two) times daily. Start 2 days prior to ascent above 9,000 feet and continue for 2-3 days until acclimated or at descent 10 tablet 0  .  buPROPion (WELLBUTRIN XL) 150 MG 24 hr tablet   3  . ciprofloxacin (CIPRO) 500 MG tablet Take 1 tablet (500 mg total) by mouth 2 (two) times daily. Take 1-2 days until diarrhea resolves 8 tablet 0  . cyclobenzaprine (FLEXERIL) 5 MG tablet   0  . diclofenac sodium (VOLTAREN) 1 % GEL Apply 2 g topically 4 (four) times daily. 1 Tube 5  . FETZIMA 120 MG CP24   3  . methocarbamol (ROBAXIN) 500 MG tablet Take 1 tablet (500 mg total) by mouth every 6 (six) hours as needed for muscle spasms. 30 tablet 2  . nitroGLYCERIN (NITRODUR - DOSED IN MG/24 HR) 0.2 mg/hr patch Place 1/4 patch to affected area for 12 hours then leave off for 12 hours. 30 patch 12  . omeprazole (PRILOSEC) 20 MG capsule Take 20 mg by mouth daily.    Marland Kitchen spironolactone (ALDACTONE) 25 MG tablet Take 25 mg by mouth daily.    . traZODone (DESYREL) 50 MG tablet Take 50 mg by mouth at bedtime.     No current facility-administered medications for this visit.     Social Hx:  reports that she has never smoked. She has never used smokeless tobacco.   Objective:   BP 120/90   Ht 5\' 6"  (1.676 m)   Wt 180 lb (81.6 kg)   BMI 29.05 kg/m  Physical Exam  Gen: NAD, alert, cooperative with exam, well-appearing  Right Elbow exam:  Inspection; no effusion, swelling, erythema Palpation;  tender to palpation over lateral epicondyle, no tenderness on medial epicondyle Range of motion; full in all directions Strength; 5 out of 5 grip strength, 5 out of 5 extension and flexion of elbow and wrist Neurovascularly intact  Assessment & Plan:  Lateral epicondylitis, right elbow Chronic and uncontrolled. Exam showing tenderness to palpation over right lateral epicondylitis. Patient has tried conservative measures for at least 4-5 years now and continues to be symptomatic. At this time it is reasonable to refer her to orthopedics for possible surgical management. -Refer to [facility] orthopedics -Continue nitroglycerin patches and Tylenol when  necessary -Continue elbow rest and avoiding tennis -Encouraged patient to do her home rehabilitation exercises   Smitty Cords, MD [place], PGY-3  Patient seen and evaluated with the resident. I agree with the above plan of care. In short, this patient has a five-year history of intermittent lateral epicondylitis. Although conservative treatment works for a period of time she always has returning symptoms. Her symptoms are interfering with her quality of life. Although a recent MRI did not show a discrete tear through the common extensor tendon I think the chronicity of her symptoms warrant surgical evaluation for possible debridement. Therefore, I will refer her to Dr. Amedeo Plenty to discuss this in greater detail. Follow-up with me as needed.

## 2017-03-05 NOTE — Assessment & Plan Note (Addendum)
Chronic and uncontrolled. Exam showing tenderness to palpation over right lateral epicondylitis. Patient has tried conservative measures for at least 4-5 years now and continues to be symptomatic. At this time it is reasonable to refer her to orthopedics for possible surgical management. -Refer to [facility] -Continue nitroglycerin patches and Tylenol when necessary -Continue elbow rest and avoiding tennis -Encouraged patient to do her home rehabilitation exercises

## 2017-03-07 ENCOUNTER — Telehealth (INDEPENDENT_AMBULATORY_CARE_PROVIDER_SITE_OTHER): Payer: Self-pay | Admitting: Orthopaedic Surgery

## 2017-03-07 NOTE — Telephone Encounter (Signed)
I received VM from patient requesting her records be faxed to [facility]. I called her back, left msg VM, advised that we need signed release form before we can send records.

## 2017-03-09 ENCOUNTER — Telehealth (INDEPENDENT_AMBULATORY_CARE_PROVIDER_SITE_OTHER): Payer: Self-pay | Admitting: Orthopaedic Surgery

## 2017-03-09 NOTE — Telephone Encounter (Signed)
Records faxed to [facility] [phone]

## 2017-03-23 DIAGNOSIS — M25521 Pain in right elbow: Secondary | ICD-10-CM | POA: Diagnosis not present

## 2017-03-23 DIAGNOSIS — M7711 Lateral epicondylitis, right elbow: Secondary | ICD-10-CM | POA: Diagnosis not present

## 2017-04-04 MED FILL — FETZIMA ER 120 MG CAPSULE: 120 | 30 days supply | Qty: 30 | Fill #1

## 2017-04-04 MED FILL — buPROPion HCL ER (XL) 150 M: 150 | 30 days supply | Qty: 30 | Fill #1

## 2017-04-17 ENCOUNTER — Ambulatory Visit: Payer: Self-pay | Admitting: Orthopedic Surgery

## 2017-04-24 DIAGNOSIS — F331 Major depressive disorder, recurrent, moderate: Secondary | ICD-10-CM | POA: Diagnosis not present

## 2017-04-24 DIAGNOSIS — F422 Mixed obsessional thoughts and acts: Secondary | ICD-10-CM | POA: Diagnosis not present

## 2017-04-24 DIAGNOSIS — F900 Attention-deficit hyperactivity disorder, predominantly inattentive type: Secondary | ICD-10-CM | POA: Diagnosis not present

## 2017-04-24 MED FILL — traZODone HCL 50 MG TABS: 50 | 30 days supply | Qty: 60 | Fill #0

## 2017-04-30 ENCOUNTER — Encounter (HOSPITAL_BASED_OUTPATIENT_CLINIC_OR_DEPARTMENT_OTHER): Payer: Self-pay | Admitting: *Deleted

## 2017-05-01 MED FILL — buPROPion HCL ER (XL) 150 M: 150 | 30 days supply | Qty: 30 | Fill #2

## 2017-05-01 MED FILL — FETZIMA ER 120 MG CAPSULE: 120 | 30 days supply | Qty: 30 | Fill #2

## 2017-05-04 ENCOUNTER — Ambulatory Visit (HOSPITAL_BASED_OUTPATIENT_CLINIC_OR_DEPARTMENT_OTHER)
Admission: RE | Admit: 2017-05-04 | Discharge: 2017-05-04 | Disposition: A | Discharge status: Home / Self Care | Payer: 59 | Source: Ambulatory Visit | Attending: Orthopedic Surgery | Admitting: Orthopedic Surgery

## 2017-05-04 ENCOUNTER — Encounter (HOSPITAL_BASED_OUTPATIENT_CLINIC_OR_DEPARTMENT_OTHER)
Admission: RE | Disposition: A | Discharge status: Home / Self Care | Payer: Self-pay | Source: Ambulatory Visit | Attending: Orthopedic Surgery

## 2017-05-04 ENCOUNTER — Encounter (HOSPITAL_BASED_OUTPATIENT_CLINIC_OR_DEPARTMENT_OTHER): Payer: Self-pay | Admitting: Anesthesiology

## 2017-05-04 ENCOUNTER — Ambulatory Visit (HOSPITAL_BASED_OUTPATIENT_CLINIC_OR_DEPARTMENT_OTHER): Payer: 59 | Admitting: Anesthesiology

## 2017-05-04 DIAGNOSIS — Z79899 Other long term (current) drug therapy: Secondary | ICD-10-CM | POA: Insufficient documentation

## 2017-05-04 DIAGNOSIS — M7711 Lateral epicondylitis, right elbow: Secondary | ICD-10-CM | POA: Diagnosis not present

## 2017-05-04 DIAGNOSIS — M25521 Pain in right elbow: Secondary | ICD-10-CM | POA: Insufficient documentation

## 2017-05-04 DIAGNOSIS — G8929 Other chronic pain: Secondary | ICD-10-CM | POA: Diagnosis not present

## 2017-05-04 DIAGNOSIS — G8918 Other acute postprocedural pain: Secondary | ICD-10-CM | POA: Diagnosis not present

## 2017-05-04 DIAGNOSIS — K219 Gastro-esophageal reflux disease without esophagitis: Secondary | ICD-10-CM | POA: Diagnosis not present

## 2017-05-04 DIAGNOSIS — R52 Pain, unspecified: Secondary | ICD-10-CM | POA: Diagnosis not present

## 2017-05-04 DIAGNOSIS — F329 Major depressive disorder, single episode, unspecified: Secondary | ICD-10-CM | POA: Insufficient documentation

## 2017-05-04 DIAGNOSIS — M6588 Other synovitis and tenosynovitis, other site: Secondary | ICD-10-CM | POA: Diagnosis not present

## 2017-05-04 HISTORY — DX: Gastro-esophageal reflux disease without esophagitis: K21.9

## 2017-05-04 HISTORY — DX: Depression, unspecified: F32.A

## 2017-05-04 HISTORY — PX: TENNIS ELBOW RELEASE/NIRSCHEL PROCEDURE: SHX6651

## 2017-05-04 HISTORY — DX: Major depressive disorder, single episode, unspecified: F32.9

## 2017-05-04 SURGERY — TENNIS ELBOW RELEASE/NIRSCHEL PROCEDURE
Anesthesia: General | Site: Elbow | Laterality: Right

## 2017-05-04 MED ORDER — LACTATED RINGERS IV SOLN
INTRAVENOUS | Status: DC
  Administered 2017-05-04: 07:00:00 via INTRAVENOUS

## 2017-05-04 MED ORDER — PROPOFOL 10 MG/ML IV BOLUS
INTRAVENOUS | Status: DC | PRN
  Administered 2017-05-04: 200 mg via INTRAVENOUS

## 2017-05-04 MED ORDER — ARTIFICIAL TEARS OPHTHALMIC OINT
TOPICAL_OINTMENT | OPHTHALMIC | Status: AC
  Filled 2017-05-04: qty 3.5

## 2017-05-04 MED ORDER — PROPOFOL 10 MG/ML IV BOLUS
INTRAVENOUS | Status: AC
  Filled 2017-05-04: qty 40

## 2017-05-04 MED ORDER — SCOPOLAMINE 1 MG/3DAYS TD PT72
MEDICATED_PATCH | TRANSDERMAL | Status: AC
  Filled 2017-05-04: qty 1

## 2017-05-04 MED ORDER — CEFAZOLIN SODIUM-DEXTROSE 2-4 GM/100ML-% IV SOLN
2.0000 g | INTRAVENOUS | Status: AC
  Administered 2017-05-04: 2 g via INTRAVENOUS

## 2017-05-04 MED ORDER — ONDANSETRON HCL 4 MG/2ML IJ SOLN
INTRAMUSCULAR | Status: AC
  Filled 2017-05-04: qty 2

## 2017-05-04 MED ORDER — LIDOCAINE 2% (20 MG/ML) 5 ML SYRINGE
INTRAMUSCULAR | Status: AC
  Filled 2017-05-04: qty 5

## 2017-05-04 MED ORDER — FENTANYL CITRATE (PF) 100 MCG/2ML IJ SOLN
INTRAMUSCULAR | Status: DC | PRN
  Administered 2017-05-04: 50 ug via INTRAVENOUS

## 2017-05-04 MED ORDER — FENTANYL CITRATE (PF) 100 MCG/2ML IJ SOLN
INTRAMUSCULAR | Status: AC
  Filled 2017-05-04: qty 2

## 2017-05-04 MED ORDER — OXYCODONE HCL 5 MG PO TABS: 10.0000 mg | ORAL_TABLET | ORAL | 0 refills | Status: AC | PRN

## 2017-05-04 MED ORDER — LIDOCAINE 2% (20 MG/ML) 5 ML SYRINGE
INTRAMUSCULAR | Status: DC | PRN
  Administered 2017-05-04: 60 mg via INTRAVENOUS

## 2017-05-04 MED ORDER — METHOCARBAMOL 500 MG PO TABS: 500.0000 mg | ORAL_TABLET | Freq: Four times a day (QID) | ORAL | 0 refills | Status: AC | PRN

## 2017-05-04 MED ORDER — MIDAZOLAM HCL 2 MG/2ML IJ SOLN
INTRAMUSCULAR | Status: AC
  Filled 2017-05-04: qty 2

## 2017-05-04 MED ORDER — BUPIVACAINE HCL (PF) 0.5 % IJ SOLN
INTRAMUSCULAR | Status: AC
  Filled 2017-05-04: qty 30

## 2017-05-04 MED ORDER — SCOPOLAMINE 1 MG/3DAYS TD PT72
1.0000 | MEDICATED_PATCH | Freq: Once | TRANSDERMAL | Status: AC | PRN
  Administered 2017-05-04: 1 via TRANSDERMAL

## 2017-05-04 MED ORDER — MIDAZOLAM HCL 2 MG/2ML IJ SOLN
1.0000 mg | INTRAMUSCULAR | Status: DC | PRN
  Administered 2017-05-04: 2 mg via INTRAVENOUS

## 2017-05-04 MED ORDER — ONDANSETRON HCL 4 MG/2ML IJ SOLN
INTRAMUSCULAR | Status: DC | PRN
  Administered 2017-05-04: 4 mg via INTRAVENOUS

## 2017-05-04 MED ORDER — BUPIVACAINE-EPINEPHRINE (PF) 0.5% -1:200000 IJ SOLN
INTRAMUSCULAR | Status: DC | PRN
  Administered 2017-05-04: 30 mL via PERINEURAL

## 2017-05-04 MED ORDER — DEXAMETHASONE SODIUM PHOSPHATE 10 MG/ML IJ SOLN
INTRAMUSCULAR | Status: AC
  Filled 2017-05-04: qty 1

## 2017-05-04 MED ORDER — FENTANYL CITRATE (PF) 100 MCG/2ML IJ SOLN
50.0000 ug | INTRAMUSCULAR | Status: DC | PRN
  Administered 2017-05-04 (×2): 50 ug via INTRAVENOUS

## 2017-05-04 MED ORDER — DEXAMETHASONE SODIUM PHOSPHATE 4 MG/ML IJ SOLN
INTRAMUSCULAR | Status: DC | PRN
  Administered 2017-05-04: 10 mg via INTRAVENOUS

## 2017-05-04 MED ORDER — CEFAZOLIN SODIUM-DEXTROSE 2-4 GM/100ML-% IV SOLN
INTRAVENOUS | Status: AC
  Filled 2017-05-04: qty 100

## 2017-05-04 MED FILL — oxyCODONE HCL 5 MG TABS: 5 | 5 days supply | Qty: 50 | Fill #0

## 2017-05-04 MED FILL — METHOCARBAMOL 500 MG TABLET: 500 | 12 days supply | Qty: 45 | Fill #0

## 2017-05-04 SURGICAL SUPPLY — 78 items
BANDAGE ACE 3X5.8 VEL STRL LF (GAUZE/BANDAGES/DRESSINGS) ×6 IMPLANT
BANDAGE ACE 4X5 VEL STRL LF (GAUZE/BANDAGES/DRESSINGS) ×3 IMPLANT
BANDAGE ACE 6X5 VEL STRL LF (GAUZE/BANDAGES/DRESSINGS) IMPLANT
BLADE CLIPPER SURG (BLADE) IMPLANT
BLADE SURG 15 STRL LF DISP TIS (BLADE) ×3 IMPLANT
BLADE SURG 15 STRL SS (BLADE) ×6
BNDG CONFORM 3 STRL LF (GAUZE/BANDAGES/DRESSINGS) ×3 IMPLANT
BNDG GAUZE ELAST 4 BULKY (GAUZE/BANDAGES/DRESSINGS) ×3 IMPLANT
BRUSH SCRUB EZ PLAIN DRY (MISCELLANEOUS) ×3 IMPLANT
CANISTER SUCT 1200ML W/VALVE (MISCELLANEOUS) ×3 IMPLANT
CLOSURE WOUND 1/2 X4 (GAUZE/BANDAGES/DRESSINGS)
CORD BIPOLAR FORCEPS 12FT (ELECTRODE) ×3 IMPLANT
COVER BACK TABLE 60X90IN (DRAPES) ×3 IMPLANT
CUFF TOURN SGL LL 18 NRW (TOURNIQUET CUFF) ×6 IMPLANT
CUFF TOURNIQUET SINGLE 18IN (TOURNIQUET CUFF) ×3 IMPLANT
DECANTER SPIKE VIAL GLASS SM (MISCELLANEOUS) IMPLANT
DRAPE EXTREMITY T 121X128X90 (DRAPE) ×3 IMPLANT
DRAPE INCISE IOBAN 66X45 STRL (DRAPES) IMPLANT
DRAPE SURG 17X23 STRL (DRAPES) ×3 IMPLANT
DRSG EMULSION OIL 3X3 NADH (GAUZE/BANDAGES/DRESSINGS) ×3 IMPLANT
EVACUATOR 1/8 PVC DRAIN (DRAIN) IMPLANT
EVACUATOR 3/16  PVC DRAIN (DRAIN)
EVACUATOR 3/16 PVC DRAIN (DRAIN) IMPLANT
EVACUATOR SILICONE 100CC (DRAIN) IMPLANT
GAUZE SPONGE 4X4 12PLY STRL (GAUZE/BANDAGES/DRESSINGS) ×3 IMPLANT
GAUZE SPONGE 4X4 16PLY XRAY LF (GAUZE/BANDAGES/DRESSINGS) ×3 IMPLANT
GAUZE XEROFORM 1X8 LF (GAUZE/BANDAGES/DRESSINGS) IMPLANT
GAUZE XEROFORM 5X9 LF (GAUZE/BANDAGES/DRESSINGS) ×3 IMPLANT
GLOVE BIO SURGEON STRL SZ8 (GLOVE) ×3 IMPLANT
GLOVE BIOGEL M STRL SZ7.5 (GLOVE) IMPLANT
GLOVE SS BIOGEL STRL SZ 8 (GLOVE) ×2 IMPLANT
GLOVE SUPERSENSE BIOGEL SZ 8 (GLOVE) ×4
GOWN STRL REUS W/ TWL LRG LVL3 (GOWN DISPOSABLE) ×1 IMPLANT
GOWN STRL REUS W/ TWL XL LVL3 (GOWN DISPOSABLE) ×1 IMPLANT
GOWN STRL REUS W/TWL LRG LVL3 (GOWN DISPOSABLE) ×2
GOWN STRL REUS W/TWL XL LVL3 (GOWN DISPOSABLE) ×2
LOOP VESSEL MAXI BLUE (MISCELLANEOUS) IMPLANT
NEEDLE HYPO 22GX1.5 SAFETY (NEEDLE) IMPLANT
NEEDLE HYPO 25X1 1.5 SAFETY (NEEDLE) ×3 IMPLANT
NS IRRIG 1000ML POUR BTL (IV SOLUTION) ×3 IMPLANT
PACK BASIN DAY SURGERY FS (CUSTOM PROCEDURE TRAY) ×3 IMPLANT
PAD CAST 3X4 CTTN HI CHSV (CAST SUPPLIES) ×2 IMPLANT
PADDING CAST ABS 4INX4YD NS (CAST SUPPLIES) ×2
PADDING CAST ABS COTTON 4X4 ST (CAST SUPPLIES) ×1 IMPLANT
PADDING CAST COTTON 3X4 STRL (CAST SUPPLIES) ×4
PADDING CAST COTTON 6X4 STRL (CAST SUPPLIES) ×3 IMPLANT
SHEET MEDIUM DRAPE 40X70 STRL (DRAPES) ×3 IMPLANT
SPLINT FAST PLASTER 5X30 (CAST SUPPLIES)
SPLINT FIBERGLASS 4X30 (CAST SUPPLIES) ×3 IMPLANT
SPLINT PLASTER CAST FAST 5X30 (CAST SUPPLIES) IMPLANT
SPLINT PLASTER CAST XFAST 3X15 (CAST SUPPLIES) IMPLANT
SPLINT PLASTER CAST XFAST 4X15 (CAST SUPPLIES) ×1 IMPLANT
SPLINT PLASTER XTRA FAST SET 4 (CAST SUPPLIES) ×2
SPLINT PLASTER XTRA FASTSET 3X (CAST SUPPLIES)
SPONGE LAP 4X18 X RAY DECT (DISPOSABLE) IMPLANT
STOCKINETTE 4X48 STRL (DRAPES) ×3 IMPLANT
STOCKINETTE SYNTHETIC 3 UNSTER (CAST SUPPLIES) ×3 IMPLANT
STRIP CLOSURE SKIN 1/2X4 (GAUZE/BANDAGES/DRESSINGS) IMPLANT
SUCTION FRAZIER HANDLE 10FR (MISCELLANEOUS)
SUCTION TUBE FRAZIER 10FR DISP (MISCELLANEOUS) IMPLANT
SUT BONE WAX W31G (SUTURE) IMPLANT
SUT FIBERWIRE 2-0 18 17.9 3/8 (SUTURE)
SUT FIBERWIRE 3-0 18 TAPR NDL (SUTURE) ×6
SUT PROLENE 4 0 PS 2 18 (SUTURE) ×6 IMPLANT
SUT VIC AB 3-0 FS2 27 (SUTURE) IMPLANT
SUT VIC AB 4-0 P-3 18XBRD (SUTURE) IMPLANT
SUT VIC AB 4-0 P3 18 (SUTURE)
SUTURE FIBERWR 2-0 18 17.9 3/8 (SUTURE) IMPLANT
SUTURE FIBERWR 3-0 18 TAPR NDL (SUTURE) ×2 IMPLANT
SYR BULB 3OZ (MISCELLANEOUS) ×3 IMPLANT
SYR CONTROL 10ML LL (SYRINGE) ×3 IMPLANT
TAPE SURG TRANSPORE 1 IN (GAUZE/BANDAGES/DRESSINGS) ×1 IMPLANT
TAPE SURGICAL TRANSPORE 1 IN (GAUZE/BANDAGES/DRESSINGS) ×2
TOWEL OR 17X24 6PK STRL BLUE (TOWEL DISPOSABLE) ×6 IMPLANT
TOWEL OR NON WOVEN STRL DISP B (DISPOSABLE) ×3 IMPLANT
TUBE CONNECTING 20'X1/4 (TUBING)
TUBE CONNECTING 20X1/4 (TUBING) IMPLANT
UNDERPAD 30X30 (UNDERPADS AND DIAPERS) ×3 IMPLANT

## 2017-05-04 NOTE — Op Note (Signed)
See full dictation [phone]  Status post ECRB debridement with partial ostectomy and arthrotomy synovectomy  We'll see her back in 2 weeks.  Gaile Allmon M.D.

## 2017-05-04 NOTE — Anesthesia Preprocedure Evaluation (Addendum)
Anesthesia Evaluation  Patient identified by MRN, date of birth, ID band Patient awake    Reviewed: Allergy & Precautions, NPO status , Patient's Chart, lab work & pertinent test results  Airway Mallampati: II  TM Distance: >3 FB Neck ROM: Full    Dental  (+) Teeth Intact, Dental Advisory Given   Pulmonary neg pulmonary ROS,    breath sounds clear to auscultation       Cardiovascular negative cardio ROS   Rhythm:Regular Rate:Normal     Neuro/Psych PSYCHIATRIC DISORDERS Depression negative neurological ROS     GI/Hepatic Neg liver ROS, GERD  Medicated,  Endo/Other  negative endocrine ROS  Renal/GU negative Renal ROS     Musculoskeletal  (+) Arthritis ,   Abdominal   Peds  Hematology negative hematology ROS (+)   Anesthesia Other Findings Day of surgery medications reviewed with the patient.  Reproductive/Obstetrics                            Anesthesia Physical Anesthesia Plan  ASA: II  Anesthesia Plan: General   Post-op Pain Management:    Induction: Intravenous  PONV Risk Score and Plan: 4 or greater and Ondansetron, Dexamethasone, Scopolamine patch - Pre-op and Propofol infusion  Airway Management Planned: LMA  Additional Equipment:   Intra-op Plan:   Post-operative Plan: Extubation in OR  Informed Consent: I have reviewed the patients History and Physical, chart, labs and discussed the procedure including the risks, benefits and alternatives for the proposed anesthesia with the patient or authorized representative who has indicated his/her understanding and acceptance.   Dental advisory given  Plan Discussed with: CRNA  Anesthesia Plan Comments:         Anesthesia Quick Evaluation

## 2017-05-04 NOTE — Progress Notes (Signed)
Assisted Dr. Hollis with right, ultrasound guided, supraclavicular block. Side rails up, monitors on throughout procedure. See vital signs in flow sheet. Tolerated Procedure well. 

## 2017-05-04 NOTE — Anesthesia Procedure Notes (Signed)
Procedure Name: LMA Insertion Date/Time: 05/04/2017 8:46 AM Performed by: Suella Broad D Pre-anesthesia Checklist: Patient identified, Emergency Drugs available, Suction available and Patient being monitored Patient Re-evaluated:Patient Re-evaluated prior to induction Oxygen Delivery Method: Circle system utilized Preoxygenation: Pre-oxygenation with 100% oxygen Induction Type: IV induction Ventilation: Mask ventilation without difficulty LMA: LMA inserted LMA Size: 4.0 Number of attempts: 1 Airway Equipment and Method: Bite block Placement Confirmation: positive ETCO2 Tube secured with: Tape Dental Injury: Teeth and Oropharynx as per pre-operative assessment

## 2017-05-04 NOTE — Anesthesia Procedure Notes (Signed)
Anesthesia Regional Block: Supraclavicular block   Pre-Anesthetic Checklist: ,, timeout performed, Correct Patient, Correct Site, Correct Laterality, Correct Procedure, Correct Position, site marked, Risks and benefits discussed,  Surgical consent,  Pre-op evaluation,  At surgeon's request and post-op pain management  Laterality: Right  Prep: chloraprep       Needles:  Injection technique: Single-shot  Needle Type: Echogenic Needle     Needle Length: 9cm  Needle Gauge: 21     Additional Needles:   Procedures:,,,, ultrasound used (permanent image in chart),,,,  Narrative:  Start time: 05/04/2017 8:05 AM End time: 05/04/2017 8:10 AM Injection made incrementally with aspirations every 5 mL.  Performed by: Personally  Anesthesiologist: Suella Broad D  Additional Notes: Tolerated well. No issues.

## 2017-05-04 NOTE — H&P (Signed)
Arieonna Medine is an 41 y.o. female.   Chief Complaint: Right elbow pain HPI: Patient presents with chronic right elbow pain refractory to conservative management. We will plan for ECRB debridement with partial ostectomy.  Past Medical History:  Diagnosis Date  . Depression    takes Welbutrin and Fetzima  . GERD (gastroesophageal reflux disease)    takes nexium    Past Surgical History:  Procedure Laterality Date  . CESAREAN SECTION      History reviewed. No pertinent family history. Social History:  reports that she has never smoked. She has never used smokeless tobacco. She reports that she drinks about 3.0 oz of alcohol per week . She reports that she does not use drugs.  Allergies:  Allergies  Allergen Reactions  . Brewers Yeast Rash    Medications Prior to Admission  Medication Sig Dispense Refill  . buPROPion (WELLBUTRIN XL) 150 MG 24 hr tablet   3  . esomeprazole (NEXIUM) 20 MG capsule Take 20 mg by mouth once.    Marland Kitchen FETZIMA 120 MG CP24   3  . polyethylene glycol (MIRALAX / GLYCOLAX) packet Take 17 g by mouth daily.    . traZODone (DESYREL) 50 MG tablet Take 50 mg by mouth at bedtime.      No results found for this or any previous visit (from the past 48 hour(s)). No results found.  Review of Systems  Respiratory: Negative.   Cardiovascular: Negative.   Gastrointestinal: Negative.   Genitourinary: Negative.     Blood pressure (!) 160/68, pulse 90, temperature 98.4 F (36.9 C), temperature source Oral, resp. rate 18, height 5\' 6"  (1.676 m), weight 85.3 kg (188 lb), last menstrual period 04/19/2017, SpO2 100 %. Physical Exam  Right ECRB chronic pain and difficulty with wrist extension flexion and associated maneuvers. Chest positive palpatory pain response and pain on wrist extension and supination against resistance.  The patient is alert and oriented in no acute distress. The patient complains of pain in the affected upper extremity.  The patient is noted  to have a normal HEENT exam. Lung fields show equal chest expansion and no shortness of breath. Abdomen exam is nontender without distention. Lower extremity examination does not show any fracture dislocation or blood clot symptoms. Pelvis is stable and the neck and back are stable and nontender. Assessment/Plan Patient understands that surgery will proceed accordingly. We are planning surgery for your upper extremity. The risk and benefits of surgery to include risk of bleeding, infection, anesthesia,  damage to normal structures and failure of the surgery to accomplish its intended goals of relieving symptoms and restoring function have been discussed in detail. With this in mind we plan to proceed. I have specifically discussed with the patient the pre-and postoperative regime and the dos and don'ts and risk and benefits in great detail. Risk and benefits of surgery also include risk of dystrophy(CRPS), chronic nerve pain, failure of the healing process to go onto completion and other inherent risks of surgery The relavent the pathophysiology of the disease/injury process, as well as the alternatives for treatment and postoperative course of action has been discussed in great detail with the patient who desires to proceed.  We will do everything in our power to help you (the patient) restore function to the upper extremity. It is a pleasure to see this patient today.  Paulene Floor, MD 05/04/2017, 8:34 AM

## 2017-05-04 NOTE — Discharge Instructions (Signed)
We recommend that you to take vitamin C 1000 mg a day to promote healing. We also recommend that if you require  pain medicine that you take a stool softener to prevent constipation as most pain medicines will have constipation side effects. We recommend either Peri-Colace or Senokot and recommend that you also consider adding MiraLAX as well to prevent the constipation affects from pain medicine if you are required to use them. These medicines are over the counter and may be purchased at a local pharmacy. A cup of yogurt and a probiotic can also be helpful during the recovery process as the medicines can disrupt your intestinal environment. Keep bandage clean and dry.  Call for any problems.  No smoking.  Criteria for driving a car: you should be off your pain medicine for 7-8 hours, able to drive one handed(confident), thinking clearly and feeling able in your judgement to drive. Continue elevation as it will decrease swelling.  If instructed by MD move your fingers within the confines of the bandage/splint.  Use ice if instructed by your MD. Call immediately for any sudden loss of feeling in your hand/arm or change in functional abilities of the extremity.      Post Anesthesia Home Care Instructions  Activity: Get plenty of rest for the remainder of the day. A responsible individual must stay with you for 24 hours following the procedure.  For the next 24 hours, DO NOT: -Drive a car -Paediatric nurse -Drink alcoholic beverages -Take any medication unless instructed by your physician -Make any legal decisions or sign important papers.  Meals: Start with liquid foods such as gelatin or soup. Progress to regular foods as tolerated. Avoid greasy, spicy, heavy foods. If nausea and/or vomiting occur, drink only clear liquids until the nausea and/or vomiting subsides. Call your physician if vomiting continues.  Special Instructions/Symptoms: Your throat may feel dry or sore from the anesthesia  or the breathing tube placed in your throat during surgery. If this causes discomfort, gargle with warm salt water. The discomfort should disappear within 24 hours.  If you had a scopolamine patch placed behind your ear for the management of post- operative nausea and/or vomiting:  1. The medication in the patch is effective for 72 hours, after which it should be removed.  Wrap patch in a tissue and discard in the trash. Wash hands thoroughly with soap and water. 2. You may remove the patch earlier than 72 hours if you experience unpleasant side effects which may include dry mouth, dizziness or visual disturbances. 3. Avoid touching the patch. Wash your hands with soap and water after contact with the patch.        Regional Anesthesia Blocks  1. Numbness or the inability to move the "blocked" extremity may last from 3-48 hours after placement. The length of time depends on the medication injected and your individual response to the medication. If the numbness is not going away after 48 hours, call your surgeon.  2. The extremity that is blocked will need to be protected until the numbness is gone and the  Strength has returned. Because you cannot feel it, you will need to take extra care to avoid injury. Because it may be weak, you may have difficulty moving it or using it. You may not know what position it is in without looking at it while the block is in effect.  3. For blocks in the legs and feet, returning to weight bearing and walking needs to be done carefully. You  will need to wait until the numbness is entirely gone and the strength has returned. You should be able to move your leg and foot normally before you try and bear weight or walk. You will need someone to be with you when you first try to ensure you do not fall and possibly risk injury.  4. Bruising and tenderness at the needle site are common side effects and will resolve in a few days.  5. Persistent numbness or new problems  with movement should be communicated to the surgeon or the [facility] [phone] [place] [phone]).

## 2017-05-04 NOTE — Transfer of Care (Signed)
Last Vitals:  Vitals:   05/04/17 0805 05/04/17 0810  BP: 126/82 (!) 160/68  Pulse: 74 90  Resp: 16 18  Temp:    SpO2: 100% 100%    Last Pain:  Vitals:   05/04/17 0658  TempSrc: Oral         Immediate Anesthesia Transfer of Care Note  Patient: Robin Stevenson  Procedure(s) Performed: Procedure(s) (LRB): Right elbow extensor carpi radialis brevis debridement with repair reconstruction as necessary (Right)  Patient Location: PACU  Anesthesia Type: General  Level of Consciousness: awake, alert  and oriented  Airway & Oxygen Therapy: Patient Spontanous Breathing and Patient connected to face mask oxygen  Post-op Assessment: Report given to PACU RN and Post -op Vital signs reviewed and stable  Post vital signs: Reviewed and stable  Complications: No apparent anesthesia complications

## 2017-05-04 NOTE — Anesthesia Postprocedure Evaluation (Signed)
Anesthesia Post Note  Patient: Robin Stevenson  Procedure(s) Performed: Procedure(s) (LRB): Right elbow extensor carpi radialis brevis debridement with repair reconstruction as necessary (Right)     Patient location during evaluation: PACU Anesthesia Type: General Level of consciousness: awake and alert Pain management: pain level controlled Vital Signs Assessment: post-procedure vital signs reviewed and stable Respiratory status: spontaneous breathing, nonlabored ventilation, respiratory function stable and patient connected to nasal cannula oxygen Cardiovascular status: blood pressure returned to baseline and stable Postop Assessment: no apparent nausea or vomiting Anesthetic complications: no    Last Vitals:  Vitals:   05/04/17 1015 05/04/17 1030  BP: 119/68   Pulse: 97 98  Resp: 16 19  Temp:    SpO2: 93% 96%    Last Pain:  Vitals:   05/04/17 1030  TempSrc:   PainSc: 0-No pain                 Effie Berkshire

## 2017-05-07 ENCOUNTER — Encounter (HOSPITAL_BASED_OUTPATIENT_CLINIC_OR_DEPARTMENT_OTHER): Payer: Self-pay | Admitting: Orthopedic Surgery

## 2017-05-07 NOTE — Op Note (Signed)
NAME:  Robin Stevenson, Robin Stevenson                 ACCOUNT NO.:  MEDICAL RECORD NO.:  [id number]  LOCATION:                                 FACILITY:  PHYSICIAN:  Satira Anis. Varonica Siharath, M.D.     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  PROCEDURES: 1. Right elbow evaluation under anesthesia. 2. Right elbow extensor carpi radialis brevis debridement with partial     ostectomy. 3. Limited radiocapitellar arthrotomy, synovectomy about the right     elbow.  SURGEON:  Satira Anis. Amedeo Plenty, M.D.  ASSISTANT:  None.  COMPLICATIONS:  None.  ANESTHESIA:  General, preoperative block.  TOURNIQUET TIME:  Less than an hour.  DRAINS:  None.  INDICATIONS:  A 41 year old female with refractory pain secondary to ECRB tendinosis.  I have discussed the risks, benefits, options of conservative and surgical avenues of care and she desires to proceed.  OPERATION IN DETAIL:  The patient was seen by myself and Anesthesia, taken to the operative theater and underwent smooth induction of general anesthetic.  Preoperatively, she was counseled and a block was placed. In the operative theater, we performed evaluation under anesthesia.  She had a competent lateral ulnar-humeral ligament.  The patient had a pre-scrub with 2 separate Hibiclens scrubs by myself followed by 10-minute surgical Betadine scrub and paint.  Sterile field was secured.  Following this, tourniquet was inflated.  I performed an additional posterolateral pivot shift test and evaluation under anesthesia, which was stable.  At this time, I made a 1.5-inch incision off the lateral aspect of the elbow with  pre-markings made with marking pen.  Dissection was carried down.  A lateral brachial cutaneous nerve branch was carefully protected and identified at all times.  I dissected down to the interval between the ECRL and the ECU regions.  At this time, the area was opened and the degenerative ECRB tendon was identified and removed.  With sharp scalpel and tangential sweeps with a knife blade, I then very carefully and cautiously removed the portions of the degenerative tissue.  This was done to my satisfaction without difficulty.  I then performed a radiocapitellar arthrotomy.  I evaluated the radiocapitellar joint and performed a limited arthrotomy, synovectomy.  At all times, I protected the lateral ulnar-humeral ligament.  Thus, the ECRB debridement was accomplished.  Following this, I performed a partial ostectomy with AWL technique.  This prepped the bone for later neovascularization of the tissue on closure.  Following this, I irrigated copiously with liter of saline.  Following this, we then performed a pants-over-vest 3-0 FiberWire stitch to stuff the healthy ECRB tendon against the eburnated bone.  All looked well.  I did perform a final posterolateral pivot shift test to ensure the ligamentous architecture was intact and it did look quite well.  Once I had a nice seal tight closure, I then irrigated additionally with tourniquet deflated and closed the wound with  interrupted Prolene. Adaptic, Xeroform, 4 x 4's, and a standard posterior splint was applied. She will see [place] back in 2 weeks for followup.  I prescribed OxyIR p.r.n. pain and Robaxin for muscle spasm.  She will continue regular medicines at home.  Vitamin C and Peri-Colace recommended.  All questions have been encouraged and answered.  It is pleasure to see her today and participate in her care plan.  We will look forward participating in postop recovery.     Satira Anis. Amedeo Plenty, M.D.     [facility]  D:  05/04/2017   T:  05/04/2017  Job:  [id number]

## 2017-05-15 DIAGNOSIS — D2262 Melanocytic nevi of left upper limb, including shoulder: Secondary | ICD-10-CM | POA: Diagnosis not present

## 2017-05-15 DIAGNOSIS — L821 Other seborrheic keratosis: Secondary | ICD-10-CM | POA: Diagnosis not present

## 2017-05-15 DIAGNOSIS — D235 Other benign neoplasm of skin of trunk: Secondary | ICD-10-CM | POA: Diagnosis not present

## 2017-05-15 DIAGNOSIS — L905 Scar conditions and fibrosis of skin: Secondary | ICD-10-CM | POA: Diagnosis not present

## 2017-05-15 DIAGNOSIS — D2372 Other benign neoplasm of skin of left lower limb, including hip: Secondary | ICD-10-CM | POA: Diagnosis not present

## 2017-05-15 DIAGNOSIS — L820 Inflamed seborrheic keratosis: Secondary | ICD-10-CM | POA: Diagnosis not present

## 2017-05-15 DIAGNOSIS — D2272 Melanocytic nevi of left lower limb, including hip: Secondary | ICD-10-CM | POA: Diagnosis not present

## 2017-05-15 DIAGNOSIS — L812 Freckles: Secondary | ICD-10-CM | POA: Diagnosis not present

## 2017-05-18 DIAGNOSIS — M7711 Lateral epicondylitis, right elbow: Secondary | ICD-10-CM | POA: Diagnosis not present

## 2017-05-24 DIAGNOSIS — M7711 Lateral epicondylitis, right elbow: Secondary | ICD-10-CM | POA: Diagnosis not present

## 2017-05-31 DIAGNOSIS — M7711 Lateral epicondylitis, right elbow: Secondary | ICD-10-CM | POA: Diagnosis not present

## 2017-06-01 MED FILL — BUPROPION HCL XL 150 MG TAB: 150 | 30 days supply | Qty: 30 | Fill #3

## 2017-06-01 MED FILL — FETZIMA ER 120 MG CAPSULE: 120 | 30 days supply | Qty: 30 | Fill #3

## 2017-06-07 DIAGNOSIS — M7711 Lateral epicondylitis, right elbow: Secondary | ICD-10-CM | POA: Diagnosis not present

## 2017-06-15 DIAGNOSIS — M7711 Lateral epicondylitis, right elbow: Secondary | ICD-10-CM | POA: Diagnosis not present

## 2017-06-29 DIAGNOSIS — M7711 Lateral epicondylitis, right elbow: Secondary | ICD-10-CM | POA: Diagnosis not present

## 2017-06-29 MED FILL — traZODone HCL 50 MG TABS: 50 | 30 days supply | Qty: 60 | Fill #1

## 2017-07-03 DIAGNOSIS — H52223 Regular astigmatism, bilateral: Secondary | ICD-10-CM | POA: Diagnosis not present

## 2017-07-03 DIAGNOSIS — H5201 Hypermetropia, right eye: Secondary | ICD-10-CM | POA: Diagnosis not present

## 2017-07-04 MED FILL — FETZIMA ER 120 MG CAPSULE: 120 | 30 days supply | Qty: 30 | Fill #0

## 2017-07-04 MED FILL — BUPROPION HCL XL 150 MG TAB: 150 | 30 days supply | Qty: 30 | Fill #0

## 2017-07-17 DIAGNOSIS — M7711 Lateral epicondylitis, right elbow: Secondary | ICD-10-CM | POA: Diagnosis not present

## 2017-07-25 DIAGNOSIS — F900 Attention-deficit hyperactivity disorder, predominantly inattentive type: Secondary | ICD-10-CM | POA: Diagnosis not present

## 2017-07-25 DIAGNOSIS — F422 Mixed obsessional thoughts and acts: Secondary | ICD-10-CM | POA: Diagnosis not present

## 2017-07-25 DIAGNOSIS — F331 Major depressive disorder, recurrent, moderate: Secondary | ICD-10-CM | POA: Diagnosis not present

## 2017-07-27 DIAGNOSIS — M7711 Lateral epicondylitis, right elbow: Secondary | ICD-10-CM | POA: Diagnosis not present

## 2017-08-02 DIAGNOSIS — M7711 Lateral epicondylitis, right elbow: Secondary | ICD-10-CM | POA: Diagnosis not present

## 2017-08-06 MED FILL — FETZIMA ER 120 MG CAPSULE: 120 | 30 days supply | Qty: 30 | Fill #1

## 2017-08-06 MED FILL — traZODone HCL 50 MG TABS: 50 | 30 days supply | Qty: 60 | Fill #2

## 2017-08-06 MED FILL — BUPROPION HCL XL 150 MG TAB: 150 | 30 days supply | Qty: 30 | Fill #1

## 2017-09-04 DIAGNOSIS — H0102B Squamous blepharitis left eye, upper and lower eyelids: Secondary | ICD-10-CM | POA: Diagnosis not present

## 2017-09-04 DIAGNOSIS — H0102A Squamous blepharitis right eye, upper and lower eyelids: Secondary | ICD-10-CM | POA: Diagnosis not present

## 2017-09-04 DIAGNOSIS — H04123 Dry eye syndrome of bilateral lacrimal glands: Secondary | ICD-10-CM | POA: Diagnosis not present

## 2017-09-10 MED FILL — FETZIMA ER 120 MG CAPSULE: 120 | 30 days supply | Qty: 30 | Fill #2

## 2017-09-10 MED FILL — BUPROPION HCL XL 150 MG TAB: 150 | 30 days supply | Qty: 30 | Fill #2

## 2017-09-24 DIAGNOSIS — M25521 Pain in right elbow: Secondary | ICD-10-CM | POA: Diagnosis not present

## 2017-10-10 MED FILL — traZODone HCL 50 MG TABS: 50 | 30 days supply | Qty: 60 | Fill #3

## 2017-10-10 MED FILL — buPROPion HCL ER (XL) 150 M: 150 | 30 days supply | Qty: 30 | Fill #3

## 2017-10-10 MED FILL — FETZIMA ER 120 MG CAPSULE: 120 | 30 days supply | Qty: 30 | Fill #3

## 2017-10-23 DIAGNOSIS — F331 Major depressive disorder, recurrent, moderate: Secondary | ICD-10-CM | POA: Diagnosis not present

## 2017-10-23 DIAGNOSIS — F900 Attention-deficit hyperactivity disorder, predominantly inattentive type: Secondary | ICD-10-CM | POA: Diagnosis not present

## 2017-10-23 DIAGNOSIS — F422 Mixed obsessional thoughts and acts: Secondary | ICD-10-CM | POA: Diagnosis not present

## 2017-10-24 DIAGNOSIS — M25521 Pain in right elbow: Secondary | ICD-10-CM | POA: Diagnosis not present

## 2017-10-31 DIAGNOSIS — M25521 Pain in right elbow: Secondary | ICD-10-CM | POA: Diagnosis not present

## 2017-10-31 DIAGNOSIS — M6580 Other synovitis and tenosynovitis, unspecified site: Secondary | ICD-10-CM | POA: Diagnosis not present

## 2017-10-31 DIAGNOSIS — M7711 Lateral epicondylitis, right elbow: Secondary | ICD-10-CM | POA: Diagnosis not present

## 2017-11-07 MED FILL — FETZIMA ER 120 MG CAPSULE: 120 | 30 days supply | Qty: 30 | Fill #0

## 2017-11-14 DIAGNOSIS — M5412 Radiculopathy, cervical region: Secondary | ICD-10-CM | POA: Diagnosis not present

## 2017-11-27 DIAGNOSIS — M542 Cervicalgia: Secondary | ICD-10-CM | POA: Diagnosis not present

## 2017-11-28 MED FILL — WELLBUTRIN XL 150 MG TABLET: 150 | 30 days supply | Qty: 30 | Fill #0

## 2017-12-07 MED FILL — FETZIMA ER 120 MG CAPSULE: 120 | 30 days supply | Qty: 30 | Fill #1

## 2017-12-27 ENCOUNTER — Other Ambulatory Visit: Payer: Self-pay | Admitting: Obstetrics and Gynecology

## 2017-12-27 DIAGNOSIS — Z6826 Body mass index (BMI) 26.0-26.9, adult: Secondary | ICD-10-CM | POA: Diagnosis not present

## 2017-12-27 DIAGNOSIS — Z01419 Encounter for gynecological examination (general) (routine) without abnormal findings: Secondary | ICD-10-CM | POA: Diagnosis not present

## 2017-12-27 DIAGNOSIS — N644 Mastodynia: Secondary | ICD-10-CM

## 2017-12-27 MED FILL — traZODone HCL 50 MG TABS: 50 | 30 days supply | Qty: 60 | Fill #0

## 2017-12-27 MED FILL — WELLBUTRIN XL 150 MG TABLET: 150 | 30 days supply | Qty: 30 | Fill #1

## 2017-12-31 ENCOUNTER — Ambulatory Visit
Admission: RE | Admit: 2017-12-31 | Discharge: 2017-12-31 | Disposition: A | Discharge status: Home / Self Care | Payer: 59 | Source: Ambulatory Visit | Attending: Obstetrics and Gynecology | Admitting: Obstetrics and Gynecology

## 2017-12-31 ENCOUNTER — Other Ambulatory Visit: Payer: Self-pay | Admitting: Obstetrics and Gynecology

## 2017-12-31 DIAGNOSIS — N644 Mastodynia: Secondary | ICD-10-CM

## 2017-12-31 DIAGNOSIS — R922 Inconclusive mammogram: Secondary | ICD-10-CM | POA: Diagnosis not present

## 2017-12-31 DIAGNOSIS — N6310 Unspecified lump in the right breast, unspecified quadrant: Secondary | ICD-10-CM

## 2017-12-31 DIAGNOSIS — N6489 Other specified disorders of breast: Secondary | ICD-10-CM | POA: Diagnosis not present

## 2018-01-15 DIAGNOSIS — F422 Mixed obsessional thoughts and acts: Secondary | ICD-10-CM | POA: Diagnosis not present

## 2018-01-15 DIAGNOSIS — F331 Major depressive disorder, recurrent, moderate: Secondary | ICD-10-CM | POA: Diagnosis not present

## 2018-01-15 DIAGNOSIS — F900 Attention-deficit hyperactivity disorder, predominantly inattentive type: Secondary | ICD-10-CM | POA: Diagnosis not present

## 2018-01-17 MED FILL — FETZIMA ER 120 MG CAPSULE: 120 | 30 days supply | Qty: 30 | Fill #2

## 2018-01-28 MED FILL — WELLBUTRIN XL 150 MG TABLET: 150 | 30 days supply | Qty: 30 | Fill #2

## 2018-02-13 DIAGNOSIS — Z23 Encounter for immunization: Secondary | ICD-10-CM | POA: Diagnosis not present

## 2018-02-13 DIAGNOSIS — Z0000 Encounter for general adult medical examination without abnormal findings: Secondary | ICD-10-CM | POA: Diagnosis not present

## 2018-02-13 DIAGNOSIS — F325 Major depressive disorder, single episode, in full remission: Secondary | ICD-10-CM | POA: Diagnosis not present

## 2018-02-13 DIAGNOSIS — R945 Abnormal results of liver function studies: Secondary | ICD-10-CM | POA: Diagnosis not present

## 2018-02-15 MED FILL — FETZIMA ER 120 MG CAPSULE: 120 | 30 days supply | Qty: 30 | Fill #3

## 2018-02-15 MED FILL — traZODone HCL 50 MG TABS: 50 | 30 days supply | Qty: 60 | Fill #1

## 2018-03-04 MED FILL — WELLBUTRIN XL 150 MG TABLET: 150 | 30 days supply | Qty: 30 | Fill #3

## 2018-03-13 MED FILL — FETZIMA ER 120 MG CAPSULE: 120 | 30 days supply | Qty: 30 | Fill #0

## 2018-04-03 MED FILL — WELLBUTRIN XL 150 MG TABLET: 150 | 30 days supply | Qty: 30 | Fill #0

## 2018-04-04 MED FILL — traZODone HCL 50 MG TABS: 50 | 30 days supply | Qty: 60 | Fill #2

## 2018-04-23 MED FILL — FETZIMA ER 120 MG CAPSULE: 120 | 30 days supply | Qty: 30 | Fill #1

## 2018-04-24 DIAGNOSIS — F900 Attention-deficit hyperactivity disorder, predominantly inattentive type: Secondary | ICD-10-CM | POA: Diagnosis not present

## 2018-04-24 DIAGNOSIS — F422 Mixed obsessional thoughts and acts: Secondary | ICD-10-CM | POA: Diagnosis not present

## 2018-04-24 DIAGNOSIS — F331 Major depressive disorder, recurrent, moderate: Secondary | ICD-10-CM | POA: Diagnosis not present

## 2018-04-30 DIAGNOSIS — F900 Attention-deficit hyperactivity disorder, predominantly inattentive type: Secondary | ICD-10-CM | POA: Diagnosis not present

## 2018-04-30 DIAGNOSIS — F422 Mixed obsessional thoughts and acts: Secondary | ICD-10-CM | POA: Diagnosis not present

## 2018-04-30 DIAGNOSIS — F331 Major depressive disorder, recurrent, moderate: Secondary | ICD-10-CM | POA: Diagnosis not present

## 2018-05-03 MED FILL — WELLBUTRIN XL 150 MG TABLET: 150 | 30 days supply | Qty: 30 | Fill #1

## 2018-05-14 DIAGNOSIS — F422 Mixed obsessional thoughts and acts: Secondary | ICD-10-CM | POA: Diagnosis not present

## 2018-05-14 DIAGNOSIS — F900 Attention-deficit hyperactivity disorder, predominantly inattentive type: Secondary | ICD-10-CM | POA: Diagnosis not present

## 2018-05-14 DIAGNOSIS — F331 Major depressive disorder, recurrent, moderate: Secondary | ICD-10-CM | POA: Diagnosis not present

## 2018-05-16 DIAGNOSIS — L812 Freckles: Secondary | ICD-10-CM | POA: Diagnosis not present

## 2018-05-16 DIAGNOSIS — D2371 Other benign neoplasm of skin of right lower limb, including hip: Secondary | ICD-10-CM | POA: Diagnosis not present

## 2018-05-16 DIAGNOSIS — L813 Cafe au lait spots: Secondary | ICD-10-CM | POA: Diagnosis not present

## 2018-05-16 DIAGNOSIS — D2272 Melanocytic nevi of left lower limb, including hip: Secondary | ICD-10-CM | POA: Diagnosis not present

## 2018-05-16 DIAGNOSIS — D2372 Other benign neoplasm of skin of left lower limb, including hip: Secondary | ICD-10-CM | POA: Diagnosis not present

## 2018-05-16 DIAGNOSIS — D225 Melanocytic nevi of trunk: Secondary | ICD-10-CM | POA: Diagnosis not present

## 2018-05-16 DIAGNOSIS — D2271 Melanocytic nevi of right lower limb, including hip: Secondary | ICD-10-CM | POA: Diagnosis not present

## 2018-05-16 DIAGNOSIS — D2261 Melanocytic nevi of right upper limb, including shoulder: Secondary | ICD-10-CM | POA: Diagnosis not present

## 2018-05-16 DIAGNOSIS — D2262 Melanocytic nevi of left upper limb, including shoulder: Secondary | ICD-10-CM | POA: Diagnosis not present

## 2018-05-16 MED FILL — FLUOCINONIDE 0.05 % SOLN: 0.05 | 30 days supply | Qty: 60 | Fill #0

## 2018-05-21 MED FILL — FETZIMA ER 120 MG CAPSULE: 120 | 30 days supply | Qty: 30 | Fill #2

## 2018-05-22 ENCOUNTER — Ambulatory Visit: Payer: Self-pay | Admitting: Family

## 2018-05-22 DIAGNOSIS — Z23 Encounter for immunization: Secondary | ICD-10-CM

## 2018-05-22 NOTE — Progress Notes (Signed)
Pt presents here today for visit to receive in right deltoid vaccine. Allergies reviewed, vaccine given, vaccine information statement provided, tolerated well.

## 2018-05-23 DIAGNOSIS — F422 Mixed obsessional thoughts and acts: Secondary | ICD-10-CM | POA: Diagnosis not present

## 2018-05-23 DIAGNOSIS — F331 Major depressive disorder, recurrent, moderate: Secondary | ICD-10-CM | POA: Diagnosis not present

## 2018-05-23 DIAGNOSIS — F900 Attention-deficit hyperactivity disorder, predominantly inattentive type: Secondary | ICD-10-CM | POA: Diagnosis not present

## 2018-05-31 MED FILL — WELLBUTRIN XL 150 MG TABLET: 150 | 30 days supply | Qty: 30 | Fill #2

## 2018-06-05 DIAGNOSIS — F900 Attention-deficit hyperactivity disorder, predominantly inattentive type: Secondary | ICD-10-CM | POA: Diagnosis not present

## 2018-06-05 DIAGNOSIS — F331 Major depressive disorder, recurrent, moderate: Secondary | ICD-10-CM | POA: Diagnosis not present

## 2018-06-05 DIAGNOSIS — F422 Mixed obsessional thoughts and acts: Secondary | ICD-10-CM | POA: Diagnosis not present

## 2018-06-11 DIAGNOSIS — F422 Mixed obsessional thoughts and acts: Secondary | ICD-10-CM | POA: Diagnosis not present

## 2018-06-11 DIAGNOSIS — F900 Attention-deficit hyperactivity disorder, predominantly inattentive type: Secondary | ICD-10-CM | POA: Diagnosis not present

## 2018-06-11 DIAGNOSIS — F331 Major depressive disorder, recurrent, moderate: Secondary | ICD-10-CM | POA: Diagnosis not present

## 2018-06-12 DIAGNOSIS — F422 Mixed obsessional thoughts and acts: Secondary | ICD-10-CM | POA: Diagnosis not present

## 2018-06-12 DIAGNOSIS — F331 Major depressive disorder, recurrent, moderate: Secondary | ICD-10-CM | POA: Diagnosis not present

## 2018-06-12 DIAGNOSIS — F900 Attention-deficit hyperactivity disorder, predominantly inattentive type: Secondary | ICD-10-CM | POA: Diagnosis not present

## 2018-06-18 DIAGNOSIS — F422 Mixed obsessional thoughts and acts: Secondary | ICD-10-CM | POA: Diagnosis not present

## 2018-06-18 DIAGNOSIS — F331 Major depressive disorder, recurrent, moderate: Secondary | ICD-10-CM | POA: Diagnosis not present

## 2018-06-18 DIAGNOSIS — F900 Attention-deficit hyperactivity disorder, predominantly inattentive type: Secondary | ICD-10-CM | POA: Diagnosis not present

## 2018-06-18 MED FILL — FETZIMA ER 120 MG CAPSULE: 120 | 30 days supply | Qty: 30 | Fill #0

## 2018-06-20 MED FILL — traZODone HCL 50 MG TABS: 50 | 30 days supply | Qty: 60 | Fill #0

## 2018-06-25 DIAGNOSIS — F331 Major depressive disorder, recurrent, moderate: Secondary | ICD-10-CM | POA: Diagnosis not present

## 2018-06-25 DIAGNOSIS — F422 Mixed obsessional thoughts and acts: Secondary | ICD-10-CM | POA: Diagnosis not present

## 2018-06-25 DIAGNOSIS — F900 Attention-deficit hyperactivity disorder, predominantly inattentive type: Secondary | ICD-10-CM | POA: Diagnosis not present

## 2018-07-09 DIAGNOSIS — F331 Major depressive disorder, recurrent, moderate: Secondary | ICD-10-CM | POA: Diagnosis not present

## 2018-07-09 DIAGNOSIS — F900 Attention-deficit hyperactivity disorder, predominantly inattentive type: Secondary | ICD-10-CM | POA: Diagnosis not present

## 2018-07-09 DIAGNOSIS — F422 Mixed obsessional thoughts and acts: Secondary | ICD-10-CM | POA: Diagnosis not present

## 2018-07-22 MED FILL — FETZIMA ER 120 MG CAPSULE: 120 | 30 days supply | Qty: 30 | Fill #1

## 2018-07-23 DIAGNOSIS — F331 Major depressive disorder, recurrent, moderate: Secondary | ICD-10-CM | POA: Diagnosis not present

## 2018-07-23 DIAGNOSIS — F422 Mixed obsessional thoughts and acts: Secondary | ICD-10-CM | POA: Diagnosis not present

## 2018-07-23 DIAGNOSIS — F900 Attention-deficit hyperactivity disorder, predominantly inattentive type: Secondary | ICD-10-CM | POA: Diagnosis not present

## 2018-07-25 DIAGNOSIS — F422 Mixed obsessional thoughts and acts: Secondary | ICD-10-CM | POA: Diagnosis not present

## 2018-07-25 DIAGNOSIS — F331 Major depressive disorder, recurrent, moderate: Secondary | ICD-10-CM | POA: Diagnosis not present

## 2018-07-25 DIAGNOSIS — F900 Attention-deficit hyperactivity disorder, predominantly inattentive type: Secondary | ICD-10-CM | POA: Diagnosis not present

## 2018-08-06 DIAGNOSIS — F900 Attention-deficit hyperactivity disorder, predominantly inattentive type: Secondary | ICD-10-CM | POA: Diagnosis not present

## 2018-08-06 DIAGNOSIS — F422 Mixed obsessional thoughts and acts: Secondary | ICD-10-CM | POA: Diagnosis not present

## 2018-08-06 DIAGNOSIS — F331 Major depressive disorder, recurrent, moderate: Secondary | ICD-10-CM | POA: Diagnosis not present

## 2018-08-20 MED FILL — FETZIMA ER 120 MG CAPSULE: 120 | 30 days supply | Qty: 30 | Fill #2

## 2018-08-20 MED FILL — traZODone HCL 50 MG TABS: 50 | 30 days supply | Qty: 60 | Fill #1

## 2018-09-03 DIAGNOSIS — F422 Mixed obsessional thoughts and acts: Secondary | ICD-10-CM | POA: Diagnosis not present

## 2018-09-03 DIAGNOSIS — F331 Major depressive disorder, recurrent, moderate: Secondary | ICD-10-CM | POA: Diagnosis not present

## 2018-09-03 DIAGNOSIS — F900 Attention-deficit hyperactivity disorder, predominantly inattentive type: Secondary | ICD-10-CM | POA: Diagnosis not present

## 2018-09-17 DIAGNOSIS — F422 Mixed obsessional thoughts and acts: Secondary | ICD-10-CM | POA: Diagnosis not present

## 2018-09-17 DIAGNOSIS — F331 Major depressive disorder, recurrent, moderate: Secondary | ICD-10-CM | POA: Diagnosis not present

## 2018-09-17 DIAGNOSIS — F900 Attention-deficit hyperactivity disorder, predominantly inattentive type: Secondary | ICD-10-CM | POA: Diagnosis not present

## 2018-09-23 MED FILL — FETZIMA ER 120 MG CAPSULE: 120 | 30 days supply | Qty: 30 | Fill #3

## 2018-10-01 DIAGNOSIS — F422 Mixed obsessional thoughts and acts: Secondary | ICD-10-CM | POA: Diagnosis not present

## 2018-10-01 DIAGNOSIS — F331 Major depressive disorder, recurrent, moderate: Secondary | ICD-10-CM | POA: Diagnosis not present

## 2018-10-01 DIAGNOSIS — F900 Attention-deficit hyperactivity disorder, predominantly inattentive type: Secondary | ICD-10-CM | POA: Diagnosis not present

## 2018-10-17 MED FILL — traZODone HCL 50 MG TABS: 50 | 30 days supply | Qty: 60 | Fill #0 | Status: TO

## 2018-10-24 MED FILL — FETZIMA ER 120 MG CAPSULE: 120 | 30 days supply | Qty: 30 | Fill #0 | Status: TO

## 2018-10-30 DIAGNOSIS — F900 Attention-deficit hyperactivity disorder, predominantly inattentive type: Secondary | ICD-10-CM | POA: Diagnosis not present

## 2018-10-30 DIAGNOSIS — F422 Mixed obsessional thoughts and acts: Secondary | ICD-10-CM | POA: Diagnosis not present

## 2018-10-30 DIAGNOSIS — F331 Major depressive disorder, recurrent, moderate: Secondary | ICD-10-CM | POA: Diagnosis not present

## 2018-11-04 MED FILL — APLENZIN ER 174 MG TABLET: 174 | 30 days supply | Qty: 30 | Fill #0 | Status: TO

## 2018-11-12 MED FILL — traZODone HCL 50 MG TABS: 50 | 30 days supply | Qty: 60 | Fill #0

## 2018-11-20 MED FILL — FETZIMA ER 120 MG CAPSULE: 120 | 30 days supply | Qty: 30 | Fill #0

## 2018-12-10 DIAGNOSIS — F422 Mixed obsessional thoughts and acts: Secondary | ICD-10-CM | POA: Diagnosis not present

## 2018-12-10 DIAGNOSIS — F331 Major depressive disorder, recurrent, moderate: Secondary | ICD-10-CM | POA: Diagnosis not present

## 2018-12-10 DIAGNOSIS — F900 Attention-deficit hyperactivity disorder, predominantly inattentive type: Secondary | ICD-10-CM | POA: Diagnosis not present

## 2018-12-11 MED FILL — APLENZIN ER 174 MG TABLET: 174 | 30 days supply | Qty: 30 | Fill #0

## 2018-12-19 MED FILL — FETZIMA ER 120 MG CAPSULE: 120 | 30 days supply | Qty: 30 | Fill #1 | Status: TO

## 2019-01-09 MED FILL — APLENZIN ER 174 MG TABLET: 174 | 30 days supply | Qty: 30 | Fill #1 | Status: TO

## 2019-01-21 DIAGNOSIS — F422 Mixed obsessional thoughts and acts: Secondary | ICD-10-CM | POA: Diagnosis not present

## 2019-01-21 DIAGNOSIS — F900 Attention-deficit hyperactivity disorder, predominantly inattentive type: Secondary | ICD-10-CM | POA: Diagnosis not present

## 2019-01-21 DIAGNOSIS — F331 Major depressive disorder, recurrent, moderate: Secondary | ICD-10-CM | POA: Diagnosis not present

## 2019-01-21 MED FILL — FETZIMA ER 120 MG CAPSULE: 120 | 30 days supply | Qty: 30 | Fill #0

## 2019-01-28 MED FILL — traZODone HCL 50 MG TABS: 50 | 30 days supply | Qty: 60 | Fill #0

## 2019-02-11 MED FILL — APLENZIN ER 174 MG TABLET: 174 | 30 days supply | Qty: 30 | Fill #0

## 2019-02-13 DIAGNOSIS — F331 Major depressive disorder, recurrent, moderate: Secondary | ICD-10-CM | POA: Diagnosis not present

## 2019-02-13 DIAGNOSIS — F422 Mixed obsessional thoughts and acts: Secondary | ICD-10-CM | POA: Diagnosis not present

## 2019-02-13 DIAGNOSIS — F900 Attention-deficit hyperactivity disorder, predominantly inattentive type: Secondary | ICD-10-CM | POA: Diagnosis not present

## 2019-02-19 MED FILL — FETZIMA ER 120 MG CAPSULE: 120 | 30 days supply | Qty: 30 | Fill #1

## 2019-03-11 MED FILL — APLENZIN ER 174 MG TABLET: 174 | 30 days supply | Qty: 30 | Fill #0

## 2019-03-19 MED FILL — FETZIMA ER 120 MG CAPSULE: 120 | 30 days supply | Qty: 30 | Fill #2

## 2019-03-19 MED FILL — traZODone HCL 50 MG TABS: 50 | 30 days supply | Qty: 60 | Fill #1

## 2019-03-23 DIAGNOSIS — Z20828 Contact with and (suspected) exposure to other viral communicable diseases: Secondary | ICD-10-CM | POA: Diagnosis not present

## 2019-03-23 DIAGNOSIS — R51 Headache: Secondary | ICD-10-CM | POA: Diagnosis not present

## 2019-03-23 DIAGNOSIS — B349 Viral infection, unspecified: Secondary | ICD-10-CM | POA: Diagnosis not present

## 2019-03-23 DIAGNOSIS — R52 Pain, unspecified: Secondary | ICD-10-CM | POA: Diagnosis not present

## 2019-04-10 MED FILL — APLENZIN ER 174 MG TABLET: 174 | 30 days supply | Qty: 30 | Fill #1

## 2019-04-16 MED FILL — FETZIMA ER 120 MG CAPSULE: 120 | 30 days supply | Qty: 30 | Fill #0

## 2019-05-08 DIAGNOSIS — R87610 Atypical squamous cells of undetermined significance on cytologic smear of cervix (ASC-US): Secondary | ICD-10-CM | POA: Diagnosis not present

## 2019-05-08 DIAGNOSIS — R61 Generalized hyperhidrosis: Secondary | ICD-10-CM | POA: Diagnosis not present

## 2019-05-08 DIAGNOSIS — Z01419 Encounter for gynecological examination (general) (routine) without abnormal findings: Secondary | ICD-10-CM | POA: Diagnosis not present

## 2019-05-08 DIAGNOSIS — Z1231 Encounter for screening mammogram for malignant neoplasm of breast: Secondary | ICD-10-CM | POA: Diagnosis not present

## 2019-05-08 DIAGNOSIS — Z6830 Body mass index (BMI) 30.0-30.9, adult: Secondary | ICD-10-CM | POA: Diagnosis not present

## 2019-05-08 DIAGNOSIS — Z3040 Encounter for surveillance of contraceptives, unspecified: Secondary | ICD-10-CM | POA: Diagnosis not present

## 2019-05-12 MED FILL — APLENZIN ER 174 MG TABLET: 174 | 30 days supply | Qty: 30 | Fill #2

## 2019-05-14 DIAGNOSIS — F900 Attention-deficit hyperactivity disorder, predominantly inattentive type: Secondary | ICD-10-CM | POA: Diagnosis not present

## 2019-05-14 DIAGNOSIS — F422 Mixed obsessional thoughts and acts: Secondary | ICD-10-CM | POA: Diagnosis not present

## 2019-05-14 DIAGNOSIS — F331 Major depressive disorder, recurrent, moderate: Secondary | ICD-10-CM | POA: Diagnosis not present

## 2019-05-22 DIAGNOSIS — L720 Epidermal cyst: Secondary | ICD-10-CM | POA: Diagnosis not present

## 2019-05-22 DIAGNOSIS — L812 Freckles: Secondary | ICD-10-CM | POA: Diagnosis not present

## 2019-05-22 DIAGNOSIS — D2262 Melanocytic nevi of left upper limb, including shoulder: Secondary | ICD-10-CM | POA: Diagnosis not present

## 2019-05-22 DIAGNOSIS — D2261 Melanocytic nevi of right upper limb, including shoulder: Secondary | ICD-10-CM | POA: Diagnosis not present

## 2019-05-22 DIAGNOSIS — D225 Melanocytic nevi of trunk: Secondary | ICD-10-CM | POA: Diagnosis not present

## 2019-05-22 DIAGNOSIS — D2361 Other benign neoplasm of skin of right upper limb, including shoulder: Secondary | ICD-10-CM | POA: Diagnosis not present

## 2019-05-26 MED FILL — FETZIMA ER 120 MG CAPSULE: 120 | 30 days supply | Qty: 30 | Fill #1

## 2019-06-09 MED FILL — APLENZIN ER 174 MG TABLET: 174 | 30 days supply | Qty: 30 | Fill #3

## 2019-06-17 MED FILL — traZODone HCL 50 MG TABS: 50 | 30 days supply | Qty: 60 | Fill #0

## 2019-06-24 MED FILL — FETZIMA ER 120 MG CAPSULE: 120 | 30 days supply | Qty: 30 | Fill #2

## 2019-07-23 DIAGNOSIS — F900 Attention-deficit hyperactivity disorder, predominantly inattentive type: Secondary | ICD-10-CM | POA: Diagnosis not present

## 2019-07-23 DIAGNOSIS — F422 Mixed obsessional thoughts and acts: Secondary | ICD-10-CM | POA: Diagnosis not present

## 2019-07-23 DIAGNOSIS — F331 Major depressive disorder, recurrent, moderate: Secondary | ICD-10-CM | POA: Diagnosis not present

## 2019-07-30 MED FILL — APLENZIN ER 174 MG TABLET: 174 | 30 days supply | Qty: 30 | Fill #0

## 2019-07-31 MED FILL — FETZIMA ER 120 MG CAPSULE: 120 | 30 days supply | Qty: 30 | Fill #0

## 2019-08-06 DIAGNOSIS — F422 Mixed obsessional thoughts and acts: Secondary | ICD-10-CM | POA: Diagnosis not present

## 2019-08-06 DIAGNOSIS — F900 Attention-deficit hyperactivity disorder, predominantly inattentive type: Secondary | ICD-10-CM | POA: Diagnosis not present

## 2019-08-06 DIAGNOSIS — F331 Major depressive disorder, recurrent, moderate: Secondary | ICD-10-CM | POA: Diagnosis not present

## 2019-08-12 MED FILL — traZODone HCL 50 MG TABS: 50 | 30 days supply | Qty: 60 | Fill #1

## 2019-08-25 MED FILL — FETZIMA ER 120 MG CAPSULE: 120 | 30 days supply | Qty: 30 | Fill #1

## 2019-09-22 MED FILL — FETZIMA ER 120 MG CAPSULE: 120 | 30 days supply | Qty: 30 | Fill #2

## 2019-10-07 DIAGNOSIS — F422 Mixed obsessional thoughts and acts: Secondary | ICD-10-CM | POA: Diagnosis not present

## 2019-10-07 DIAGNOSIS — F331 Major depressive disorder, recurrent, moderate: Secondary | ICD-10-CM | POA: Diagnosis not present

## 2019-10-07 DIAGNOSIS — F900 Attention-deficit hyperactivity disorder, predominantly inattentive type: Secondary | ICD-10-CM | POA: Diagnosis not present

## 2019-10-08 MED FILL — traZODone HCL 50 MG TABS: 50 | 30 days supply | Qty: 60 | Fill #2

## 2019-10-24 MED FILL — FETZIMA ER 120 MG CAPSULE: 120 | 30 days supply | Qty: 30 | Fill #3

## 2019-10-25 ENCOUNTER — Ambulatory Visit: Payer: 59

## 2019-10-26 ENCOUNTER — Ambulatory Visit: Payer: 59

## 2019-10-27 MED FILL — FETZIMA ER 120 MG CAPSULE: 120 | 30 days supply | Qty: 30 | Fill #3

## 2019-11-27 MED FILL — FETZIMA ER 120 MG CAPSULE: 120 | 30 days supply | Qty: 30 | Fill #0

## 2019-12-11 ENCOUNTER — Other Ambulatory Visit (HOSPITAL_COMMUNITY): Payer: Self-pay | Admitting: Adult Health Nurse Practitioner

## 2019-12-15 MED FILL — traZODone HCL 50 MG TABS: 50 | 90 days supply | Qty: 180 | Fill #0

## 2019-12-22 DIAGNOSIS — H93A1 Pulsatile tinnitus, right ear: Secondary | ICD-10-CM | POA: Diagnosis not present

## 2019-12-26 MED FILL — FETZIMA ER 120 MG CAPSULE: 120 | 30 days supply | Qty: 30 | Fill #1

## 2020-01-15 DIAGNOSIS — M25551 Pain in right hip: Secondary | ICD-10-CM | POA: Diagnosis not present

## 2020-01-20 DIAGNOSIS — M25551 Pain in right hip: Secondary | ICD-10-CM | POA: Diagnosis not present

## 2020-01-28 DIAGNOSIS — M25551 Pain in right hip: Secondary | ICD-10-CM | POA: Diagnosis not present

## 2020-02-06 DIAGNOSIS — M25551 Pain in right hip: Secondary | ICD-10-CM | POA: Diagnosis not present

## 2020-02-24 DIAGNOSIS — F900 Attention-deficit hyperactivity disorder, predominantly inattentive type: Secondary | ICD-10-CM | POA: Diagnosis not present

## 2020-02-24 DIAGNOSIS — F422 Mixed obsessional thoughts and acts: Secondary | ICD-10-CM | POA: Diagnosis not present

## 2020-02-24 DIAGNOSIS — F331 Major depressive disorder, recurrent, moderate: Secondary | ICD-10-CM | POA: Diagnosis not present

## 2020-02-24 MED FILL — FETZIMA ER 120 MG CAPSULE: 120 | 30 days supply | Qty: 30 | Fill #1

## 2020-03-03 ENCOUNTER — Other Ambulatory Visit: Payer: Self-pay | Admitting: Physician Assistant

## 2020-03-03 DIAGNOSIS — H903 Sensorineural hearing loss, bilateral: Secondary | ICD-10-CM | POA: Diagnosis not present

## 2020-03-03 DIAGNOSIS — H93A1 Pulsatile tinnitus, right ear: Secondary | ICD-10-CM

## 2020-03-16 ENCOUNTER — Other Ambulatory Visit: Payer: Self-pay

## 2020-03-16 ENCOUNTER — Ambulatory Visit (HOSPITAL_COMMUNITY)
Admission: RE | Admit: 2020-03-16 | Discharge: 2020-03-16 | Disposition: A | Discharge status: Home / Self Care | Payer: 59 | Source: Ambulatory Visit | Attending: Physician Assistant | Admitting: Physician Assistant

## 2020-03-16 DIAGNOSIS — H93A1 Pulsatile tinnitus, right ear: Secondary | ICD-10-CM | POA: Diagnosis not present

## 2020-03-17 ENCOUNTER — Other Ambulatory Visit (HOSPITAL_COMMUNITY): Payer: Self-pay | Admitting: Physician Assistant

## 2020-03-17 DIAGNOSIS — H93A1 Pulsatile tinnitus, right ear: Secondary | ICD-10-CM

## 2020-03-24 ENCOUNTER — Ambulatory Visit (HOSPITAL_COMMUNITY): Payer: 59

## 2020-03-24 MED FILL — FETZIMA ER 120 MG CAPSULE: 120 | 30 days supply | Qty: 30 | Fill #2

## 2020-04-23 DIAGNOSIS — Z20828 Contact with and (suspected) exposure to other viral communicable diseases: Secondary | ICD-10-CM | POA: Diagnosis not present

## 2020-04-28 ENCOUNTER — Other Ambulatory Visit (HOSPITAL_COMMUNITY): Payer: Self-pay | Admitting: Adult Health Nurse Practitioner

## 2020-04-29 MED FILL — FETZIMA ER 120 MG CAPSULE: 120 | 30 days supply | Qty: 30 | Fill #0

## 2020-05-11 DIAGNOSIS — F900 Attention-deficit hyperactivity disorder, predominantly inattentive type: Secondary | ICD-10-CM | POA: Diagnosis not present

## 2020-05-11 DIAGNOSIS — F422 Mixed obsessional thoughts and acts: Secondary | ICD-10-CM | POA: Diagnosis not present

## 2020-05-11 DIAGNOSIS — F331 Major depressive disorder, recurrent, moderate: Secondary | ICD-10-CM | POA: Diagnosis not present

## 2020-05-24 DIAGNOSIS — L720 Epidermal cyst: Secondary | ICD-10-CM | POA: Diagnosis not present

## 2020-05-24 DIAGNOSIS — L812 Freckles: Secondary | ICD-10-CM | POA: Diagnosis not present

## 2020-05-24 DIAGNOSIS — D2339 Other benign neoplasm of skin of other parts of face: Secondary | ICD-10-CM | POA: Diagnosis not present

## 2020-05-24 DIAGNOSIS — D2262 Melanocytic nevi of left upper limb, including shoulder: Secondary | ICD-10-CM | POA: Diagnosis not present

## 2020-05-24 DIAGNOSIS — D2261 Melanocytic nevi of right upper limb, including shoulder: Secondary | ICD-10-CM | POA: Diagnosis not present

## 2020-05-24 DIAGNOSIS — L813 Cafe au lait spots: Secondary | ICD-10-CM | POA: Diagnosis not present

## 2020-05-24 DIAGNOSIS — D225 Melanocytic nevi of trunk: Secondary | ICD-10-CM | POA: Diagnosis not present

## 2020-05-24 DIAGNOSIS — D2272 Melanocytic nevi of left lower limb, including hip: Secondary | ICD-10-CM | POA: Diagnosis not present

## 2020-05-24 DIAGNOSIS — D2371 Other benign neoplasm of skin of right lower limb, including hip: Secondary | ICD-10-CM | POA: Diagnosis not present

## 2020-05-24 DIAGNOSIS — D2271 Melanocytic nevi of right lower limb, including hip: Secondary | ICD-10-CM | POA: Diagnosis not present

## 2020-05-24 DIAGNOSIS — D2239 Melanocytic nevi of other parts of face: Secondary | ICD-10-CM | POA: Diagnosis not present

## 2020-05-24 MED FILL — FETZIMA ER 120 MG CAPSULE: 120 | 30 days supply | Qty: 30 | Fill #1

## 2020-06-03 MED FILL — traZODone HCL 50 MG TABS: 50 | 90 days supply | Qty: 180 | Fill #1

## 2020-06-21 MED FILL — FETZIMA ER 120 MG CAPSULE: 120 | 30 days supply | Qty: 30 | Fill #2

## 2020-07-01 DIAGNOSIS — L720 Epidermal cyst: Secondary | ICD-10-CM | POA: Diagnosis not present

## 2020-07-23 ENCOUNTER — Ambulatory Visit: Payer: 59 | Attending: Internal Medicine

## 2020-07-23 DIAGNOSIS — Z23 Encounter for immunization: Secondary | ICD-10-CM

## 2020-07-23 NOTE — Progress Notes (Signed)
° °  Covid-19 Vaccination Clinic  Name:  JACKELINE GUTKNECHT    MRN: [id number] DOB: 19-Dec-1975  07/23/2020  Ms. Brining was observed post Covid-19 immunization for 15 minutes without incident. She was provided with Vaccine Information Sheet and instruction to access the V-Safe system.   Ms. Gayheart was instructed to call 911 with any severe reactions post vaccine:  Difficulty breathing   Swelling of face and throat   A fast heartbeat   A bad rash all over body   Dizziness and weakness   Immunizations Administered    Name Date Dose VIS Date Route   Pfizer COVID-19 Vaccine 07/23/2020  2:30 PM 0.3 mL 06/09/2020 Intramuscular   Manufacturer: [place]   Lot: [id number]   [place]: [id number]

## 2020-07-28 MED FILL — FETZIMA ER 120 MG CAPSULE: 120 | 30 days supply | Qty: 30 | Fill #0

## 2020-08-05 DIAGNOSIS — F331 Major depressive disorder, recurrent, moderate: Secondary | ICD-10-CM | POA: Diagnosis not present

## 2020-08-05 DIAGNOSIS — F422 Mixed obsessional thoughts and acts: Secondary | ICD-10-CM | POA: Diagnosis not present

## 2020-08-05 DIAGNOSIS — F900 Attention-deficit hyperactivity disorder, predominantly inattentive type: Secondary | ICD-10-CM | POA: Diagnosis not present

## 2020-08-23 MED FILL — FETZIMA ER 120 MG CAPSULE: 120 | 30 days supply | Qty: 30 | Fill #1

## 2020-09-13 ENCOUNTER — Other Ambulatory Visit (HOSPITAL_COMMUNITY): Payer: Self-pay | Admitting: Internal Medicine

## 2020-09-13 MED FILL — SUMAtriptan SUCCINATE 50 MG: 50 | 30 days supply | Qty: 10 | Fill #0

## 2020-09-20 MED FILL — FETZIMA ER 120 MG CAPSULE: 120 | 30 days supply | Qty: 30 | Fill #2

## 2020-10-20 MED FILL — FETZIMA ER 120 MG CAPSULE: 120 | 30 days supply | Qty: 30 | Fill #3

## 2020-11-17 ENCOUNTER — Other Ambulatory Visit (HOSPITAL_COMMUNITY): Payer: Self-pay | Admitting: Adult Health Nurse Practitioner

## 2020-11-17 MED FILL — FETZIMA ER 120 MG CAPSULE: 120 | 30 days supply | Qty: 30 | Fill #0

## 2020-11-19 DIAGNOSIS — Z01419 Encounter for gynecological examination (general) (routine) without abnormal findings: Secondary | ICD-10-CM | POA: Diagnosis not present

## 2020-11-19 DIAGNOSIS — Z6829 Body mass index (BMI) 29.0-29.9, adult: Secondary | ICD-10-CM | POA: Diagnosis not present

## 2020-11-19 DIAGNOSIS — Z1231 Encounter for screening mammogram for malignant neoplasm of breast: Secondary | ICD-10-CM | POA: Diagnosis not present

## 2020-11-29 ENCOUNTER — Other Ambulatory Visit (HOSPITAL_COMMUNITY): Payer: Self-pay

## 2020-12-01 ENCOUNTER — Other Ambulatory Visit (HOSPITAL_COMMUNITY): Payer: Self-pay

## 2020-12-02 ENCOUNTER — Other Ambulatory Visit (HOSPITAL_COMMUNITY): Payer: Self-pay

## 2020-12-02 MED ORDER — TRAZODONE HCL 50 MG PO TABS
50.0000 mg | ORAL_TABLET | Freq: Every day | ORAL | 1 refills | Status: AC
  Filled 2020-12-02 – 2020-12-14 (×2): qty 180, 90d supply, fill #0
  Filled 2021-06-06: qty 180, 90d supply, fill #1

## 2020-12-03 ENCOUNTER — Other Ambulatory Visit (HOSPITAL_COMMUNITY): Payer: Self-pay

## 2020-12-13 ENCOUNTER — Other Ambulatory Visit (HOSPITAL_COMMUNITY): Payer: Self-pay

## 2020-12-14 ENCOUNTER — Other Ambulatory Visit (HOSPITAL_COMMUNITY): Payer: Self-pay

## 2020-12-15 DIAGNOSIS — F331 Major depressive disorder, recurrent, moderate: Secondary | ICD-10-CM | POA: Diagnosis not present

## 2020-12-15 DIAGNOSIS — F422 Mixed obsessional thoughts and acts: Secondary | ICD-10-CM | POA: Diagnosis not present

## 2020-12-15 DIAGNOSIS — F900 Attention-deficit hyperactivity disorder, predominantly inattentive type: Secondary | ICD-10-CM | POA: Diagnosis not present

## 2020-12-20 ENCOUNTER — Other Ambulatory Visit (HOSPITAL_COMMUNITY): Payer: Self-pay

## 2020-12-20 MED FILL — Levomilnacipran HCl Cap ER 24HR 120 MG (Base Equivalent): ORAL | 30 days supply | Qty: 30 | Fill #0 | Status: AC

## 2020-12-21 ENCOUNTER — Other Ambulatory Visit (HOSPITAL_COMMUNITY): Payer: Self-pay

## 2020-12-21 MED ORDER — AZITHROMYCIN 500 MG PO TABS
ORAL_TABLET | Freq: Every day | ORAL | 0 refills | Status: AC
  Filled 2020-12-21: qty 4, 3d supply, fill #0

## 2020-12-21 MED ORDER — ONDANSETRON HCL 4 MG PO TABS
4.0000 mg | ORAL_TABLET | Freq: Four times a day (QID) | ORAL | 0 refills | Status: AC | PRN
  Filled 2020-12-21: qty 15, 4d supply, fill #0

## 2020-12-21 MED ORDER — ATOVAQUONE-PROGUANIL HCL 250-100 MG PO TABS
ORAL_TABLET | Freq: Every day | ORAL | 0 refills | Status: AC
  Filled 2020-12-21: qty 24, 24d supply, fill #0

## 2020-12-22 ENCOUNTER — Other Ambulatory Visit (HOSPITAL_COMMUNITY): Payer: Self-pay

## 2021-01-12 ENCOUNTER — Other Ambulatory Visit (HOSPITAL_COMMUNITY): Payer: Self-pay

## 2021-01-12 MED FILL — Sumatriptan Succinate Tab 50 MG: ORAL | 30 days supply | Qty: 10 | Fill #0 | Status: AC

## 2021-01-14 DIAGNOSIS — Z23 Encounter for immunization: Secondary | ICD-10-CM | POA: Diagnosis not present

## 2021-01-17 MED FILL — Levomilnacipran HCl Cap ER 24HR 120 MG (Base Equivalent): ORAL | 30 days supply | Qty: 30 | Fill #1 | Status: AC

## 2021-01-18 ENCOUNTER — Other Ambulatory Visit (HOSPITAL_COMMUNITY): Payer: Self-pay

## 2021-02-01 IMAGING — CT CT TEMPORAL BONES W/O CM
2 of 6 series · 12 of 40 positions shown, 15 images · non-contrast
Comparison: No pertinent prior exams are available for comparison.

CLINICAL DATA: Pulsatile tinnitus of right ear. Additional history
provided by scanning technologist: Patient reports pulsatile
tinnitus right ear for several years.

EXAM:
CT TEMPORAL BONES WITHOUT CONTRAST
TECHNIQUE: Axial and coronal plane CT imaging of the petrous temporal bones was
performed with thin-collimation image reconstruction. No intravenous
contrast was administered. Multiplanar CT image reconstructions were
also generated.

[Series 7: ax mag right · axial · 0.20mm/px · z∈[-125,-70]mm · 10 of 111 slices shown, 13 images]
[im 10/111  brain]
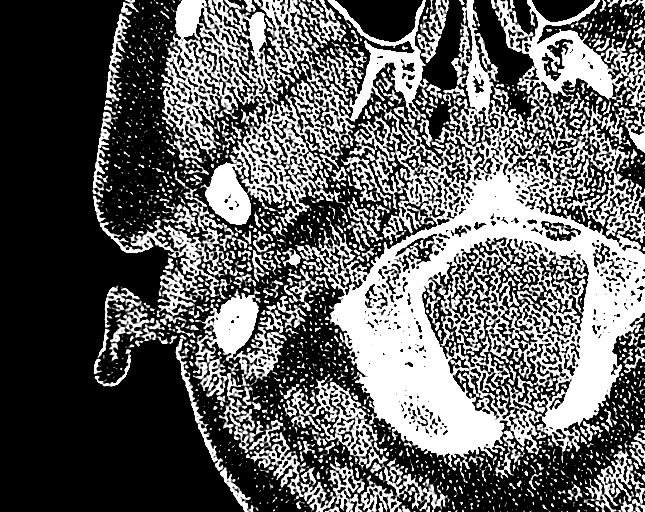
[im 10/111  bone]
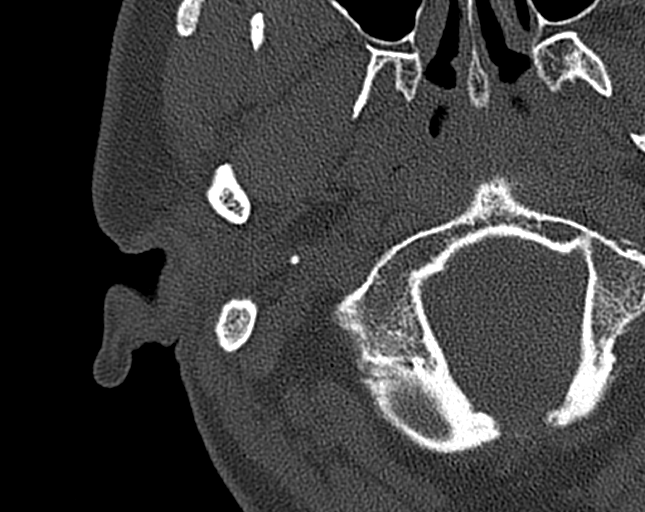
[im 19/111  bone]
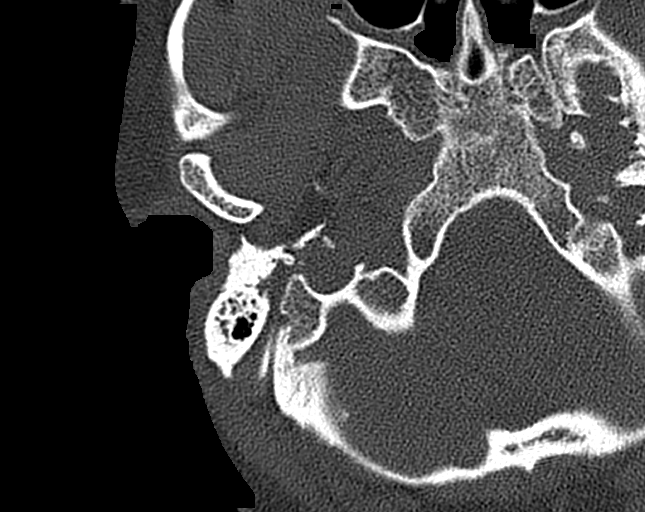
[im 28/111  bone]
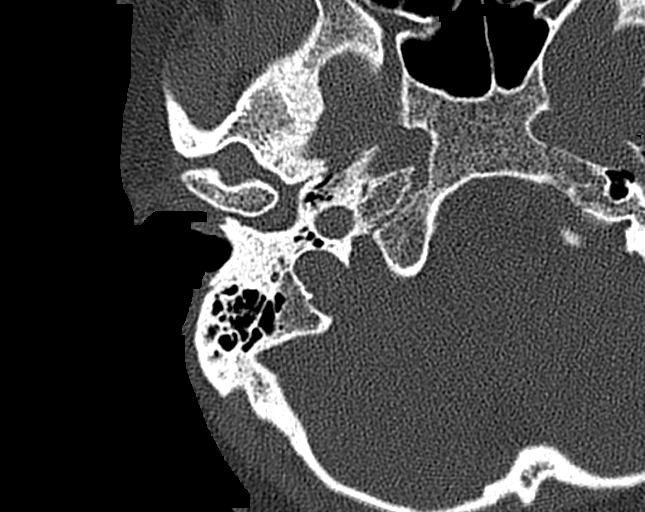
[im 37/111  bone]
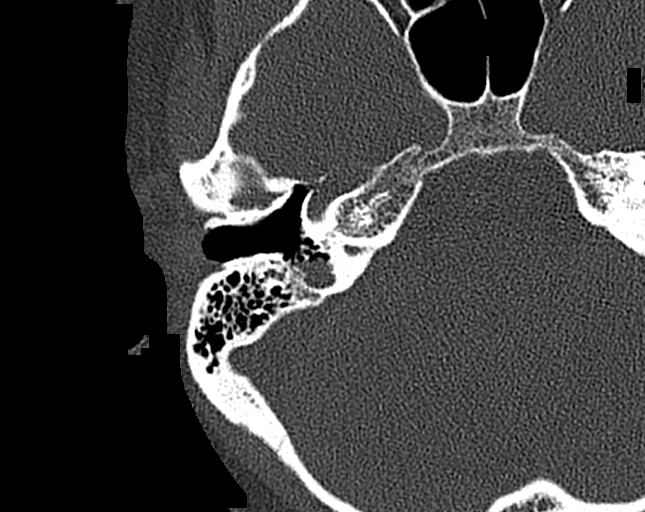
[im 46/111  brain]
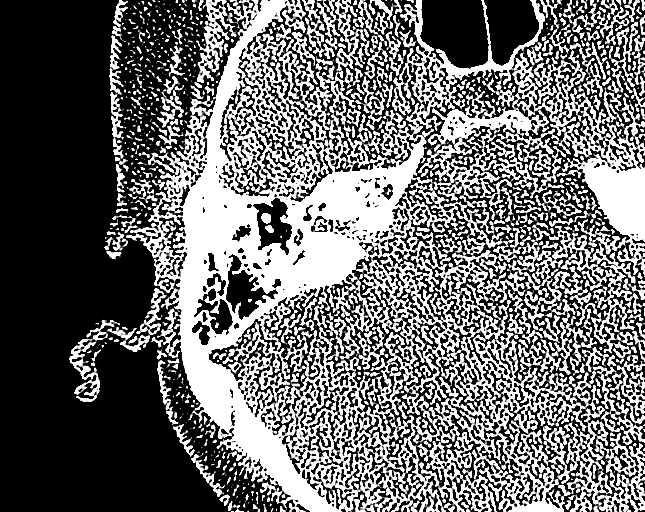
[im 46/111  bone]
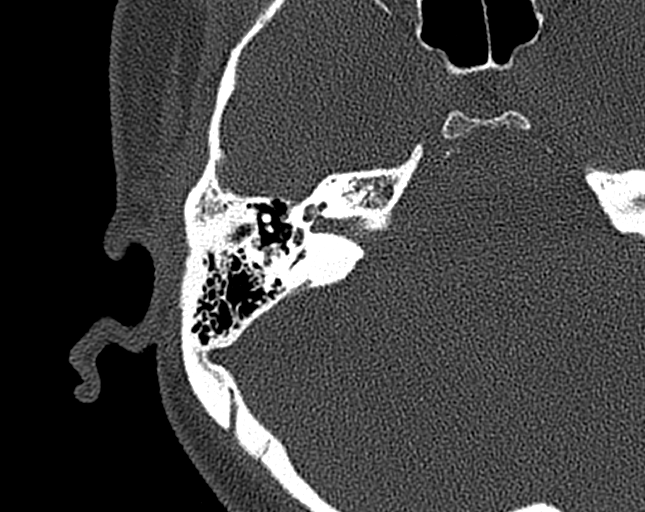
[im 65/111  bone]
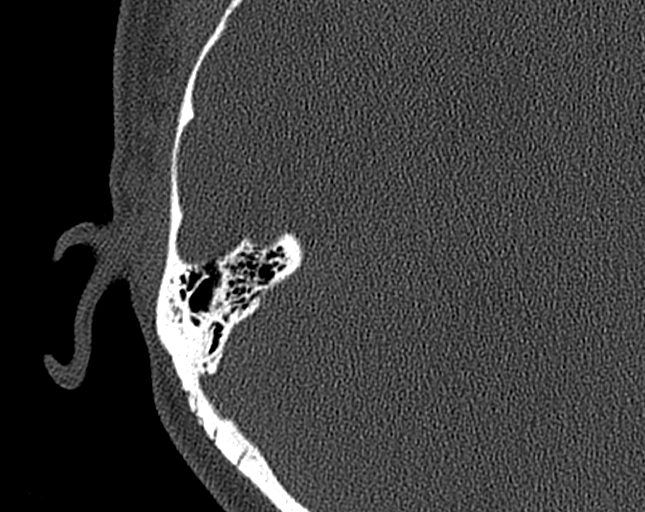
[im 74/111  bone]
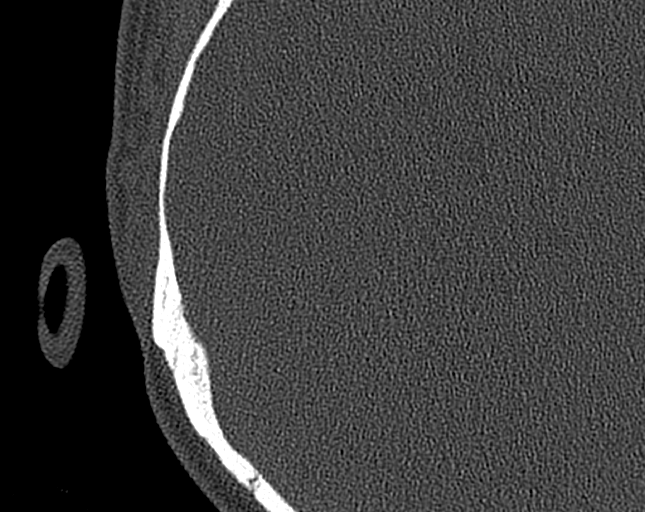
[im 83/111  bone]
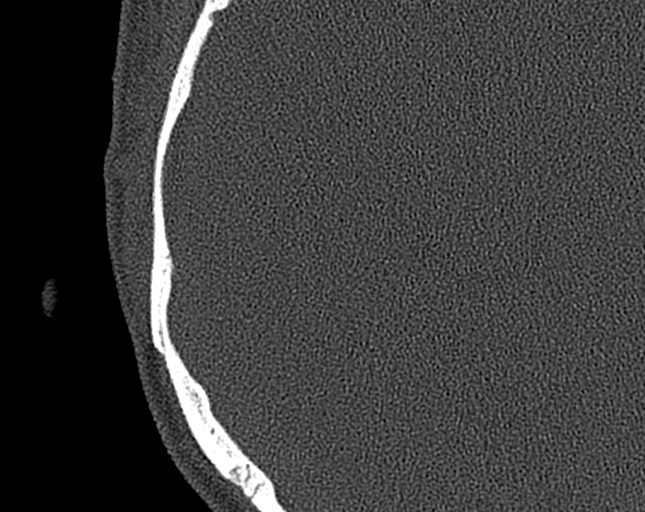
[im 92/111  brain]
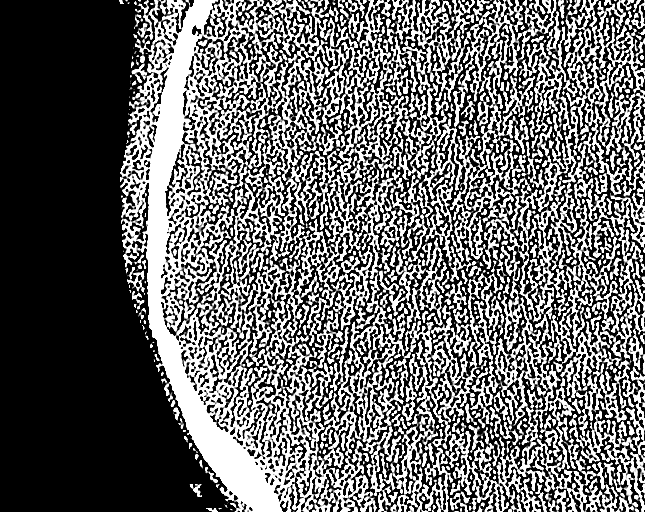
[im 92/111  bone]
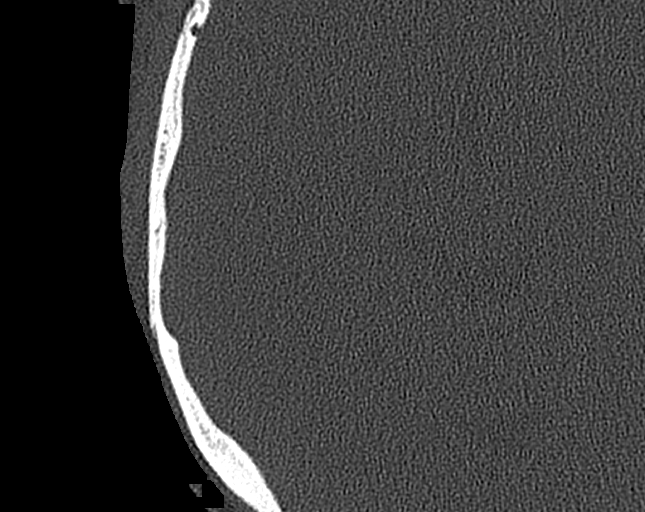
[im 101/111  bone]
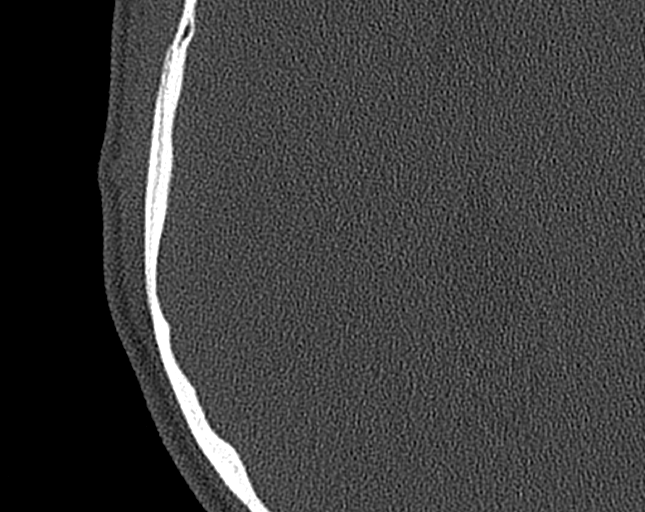

[Series 9: cor mag right · coronal · 0.16mm/px · 2 of 167 slices shown]
[im 56/167  bone]
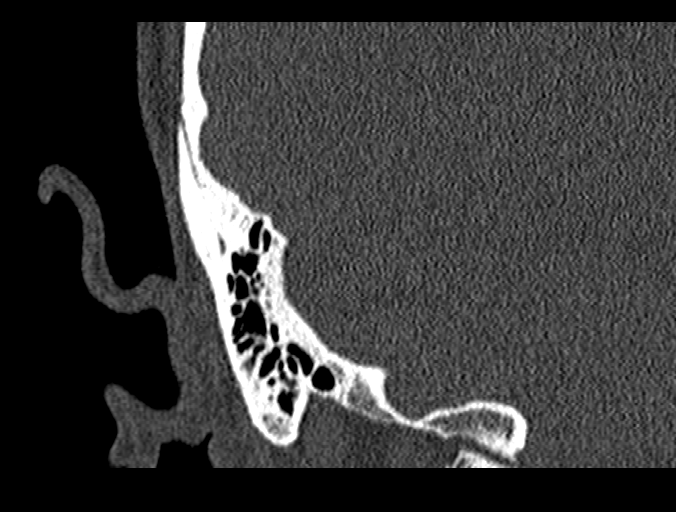
[im 111/167  bone]
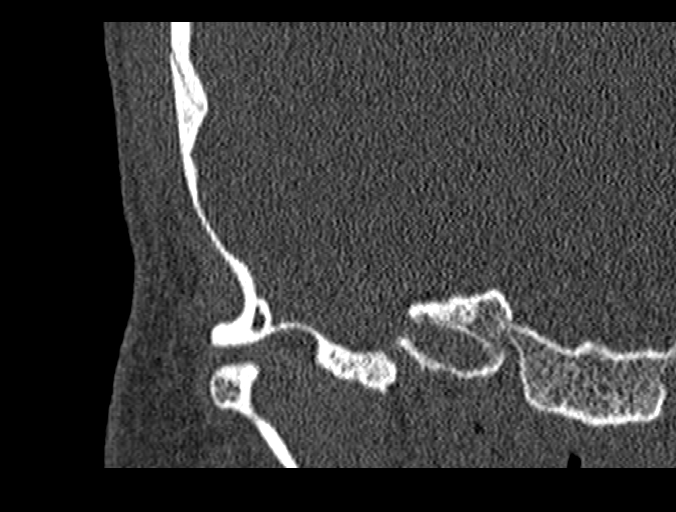

[12 of 40 positions shown; findings below may reference images not displayed]

FINDINGS: RIGHT:The external auditory canal is patent.The middle ear cavity is
well-aerated.The ossicles are unremarkable.The inner ear structures,
internal auditory and facial nerve canals are normal.The mastoid air
cells are well-aerated. The right jugular bulb is asymmetrically
high in position, extending nearly to the level of the floor of the
right IAC.

LEFT:There is a small amount of debris abutting the tympanic
membrane. The external auditory canal is otherwise patent.The middle
ear cavity is well-aerated.The ossicles are unremarkable.The inner
ear structures, internal auditory and facial nerve canals are
normal.The mastoid air cells are well-aerated.

The imaged paranasal sinuses are well aerated.
IMPRESSION: The right jugular bulb is asymmetrically higher in position,
extending nearly to the level of the floor of the internal auditory
canal.

Small amount of debris within the left external auditory canal.

Otherwise unremarkable noncontrast CT of the temporal bones, as
outlined.

## 2021-02-02 ENCOUNTER — Other Ambulatory Visit (HOSPITAL_COMMUNITY): Payer: Self-pay

## 2021-02-02 MED FILL — Levomilnacipran HCl Cap ER 24HR 120 MG (Base Equivalent): ORAL | 30 days supply | Qty: 30 | Fill #2 | Status: AC

## 2021-03-10 ENCOUNTER — Other Ambulatory Visit (HOSPITAL_COMMUNITY): Payer: Self-pay

## 2021-03-10 MED FILL — Sumatriptan Succinate Tab 50 MG: ORAL | 30 days supply | Qty: 10 | Fill #1 | Status: AC

## 2021-03-15 ENCOUNTER — Other Ambulatory Visit (HOSPITAL_COMMUNITY): Payer: Self-pay

## 2021-03-16 ENCOUNTER — Other Ambulatory Visit (HOSPITAL_COMMUNITY): Payer: Self-pay

## 2021-03-16 DIAGNOSIS — F331 Major depressive disorder, recurrent, moderate: Secondary | ICD-10-CM | POA: Diagnosis not present

## 2021-03-16 DIAGNOSIS — F422 Mixed obsessional thoughts and acts: Secondary | ICD-10-CM | POA: Diagnosis not present

## 2021-03-16 DIAGNOSIS — F900 Attention-deficit hyperactivity disorder, predominantly inattentive type: Secondary | ICD-10-CM | POA: Diagnosis not present

## 2021-03-16 MED ORDER — LEVOMILNACIPRAN HCL ER 120 MG PO CP24
1.0000 | ORAL_CAPSULE | Freq: Every day | ORAL | 1 refills | Status: DC
  Filled 2021-03-16: qty 90, 90d supply, fill #0
  Filled 2021-06-15: qty 60, 60d supply, fill #1
  Filled 2021-08-22: qty 60, 60d supply, fill #2
  Filled 2021-08-23: qty 30, 30d supply, fill #2

## 2021-04-04 ENCOUNTER — Other Ambulatory Visit (HOSPITAL_COMMUNITY): Payer: Self-pay

## 2021-04-04 DIAGNOSIS — L245 Irritant contact dermatitis due to other chemical products: Secondary | ICD-10-CM | POA: Diagnosis not present

## 2021-04-04 MED ORDER — FLUOCINONIDE 0.05 % EX OINT
TOPICAL_OINTMENT | Freq: Two times a day (BID) | CUTANEOUS | 0 refills | Status: DC
  Filled 2021-04-04: qty 30, 14d supply, fill #0

## 2021-04-27 ENCOUNTER — Other Ambulatory Visit (HOSPITAL_COMMUNITY): Payer: Self-pay

## 2021-04-27 MED FILL — Sumatriptan Succinate Tab 50 MG: ORAL | 30 days supply | Qty: 10 | Fill #2 | Status: AC

## 2021-05-17 ENCOUNTER — Other Ambulatory Visit (HOSPITAL_COMMUNITY): Payer: Self-pay

## 2021-05-17 DIAGNOSIS — R21 Rash and other nonspecific skin eruption: Secondary | ICD-10-CM | POA: Diagnosis not present

## 2021-05-17 DIAGNOSIS — B353 Tinea pedis: Secondary | ICD-10-CM | POA: Diagnosis not present

## 2021-05-17 MED ORDER — TERBINAFINE HCL 250 MG PO TABS
250.0000 mg | ORAL_TABLET | Freq: Every day | ORAL | 0 refills | Status: DC
  Filled 2021-05-17: qty 14, 14d supply, fill #0

## 2021-05-30 DIAGNOSIS — B353 Tinea pedis: Secondary | ICD-10-CM | POA: Diagnosis not present

## 2021-05-30 DIAGNOSIS — D2261 Melanocytic nevi of right upper limb, including shoulder: Secondary | ICD-10-CM | POA: Diagnosis not present

## 2021-05-30 DIAGNOSIS — D225 Melanocytic nevi of trunk: Secondary | ICD-10-CM | POA: Diagnosis not present

## 2021-05-30 DIAGNOSIS — D1801 Hemangioma of skin and subcutaneous tissue: Secondary | ICD-10-CM | POA: Diagnosis not present

## 2021-05-30 DIAGNOSIS — L812 Freckles: Secondary | ICD-10-CM | POA: Diagnosis not present

## 2021-05-30 DIAGNOSIS — D2271 Melanocytic nevi of right lower limb, including hip: Secondary | ICD-10-CM | POA: Diagnosis not present

## 2021-05-30 DIAGNOSIS — D2262 Melanocytic nevi of left upper limb, including shoulder: Secondary | ICD-10-CM | POA: Diagnosis not present

## 2021-05-30 DIAGNOSIS — D2272 Melanocytic nevi of left lower limb, including hip: Secondary | ICD-10-CM | POA: Diagnosis not present

## 2021-05-30 DIAGNOSIS — L910 Hypertrophic scar: Secondary | ICD-10-CM | POA: Diagnosis not present

## 2021-06-06 ENCOUNTER — Other Ambulatory Visit (HOSPITAL_COMMUNITY): Payer: Self-pay

## 2021-06-15 ENCOUNTER — Other Ambulatory Visit (HOSPITAL_COMMUNITY): Payer: Self-pay

## 2021-06-15 DIAGNOSIS — F331 Major depressive disorder, recurrent, moderate: Secondary | ICD-10-CM | POA: Diagnosis not present

## 2021-06-15 DIAGNOSIS — F900 Attention-deficit hyperactivity disorder, predominantly inattentive type: Secondary | ICD-10-CM | POA: Diagnosis not present

## 2021-06-15 DIAGNOSIS — F422 Mixed obsessional thoughts and acts: Secondary | ICD-10-CM | POA: Diagnosis not present

## 2021-06-15 MED FILL — Sumatriptan Succinate Tab 50 MG: ORAL | 30 days supply | Qty: 10 | Fill #3 | Status: AC

## 2021-06-16 ENCOUNTER — Other Ambulatory Visit (HOSPITAL_COMMUNITY): Payer: Self-pay

## 2021-06-17 ENCOUNTER — Other Ambulatory Visit (HOSPITAL_COMMUNITY): Payer: Self-pay

## 2021-06-17 MED ORDER — FLUOCINONIDE 0.05 % EX OINT
TOPICAL_OINTMENT | CUTANEOUS | 0 refills | Status: AC
  Filled 2021-06-17: qty 30, 14d supply, fill #0

## 2021-06-20 ENCOUNTER — Other Ambulatory Visit (HOSPITAL_COMMUNITY): Payer: Self-pay

## 2021-06-21 HISTORY — PX: REDUCTION MAMMAPLASTY: SUR839

## 2021-06-23 ENCOUNTER — Other Ambulatory Visit (HOSPITAL_COMMUNITY): Payer: Self-pay

## 2021-06-23 MED ORDER — ONDANSETRON HCL 4 MG PO TABS
4.0000 mg | ORAL_TABLET | Freq: Four times a day (QID) | ORAL | 0 refills | Status: AC | PRN
  Filled 2021-06-23: qty 30, 5d supply, fill #0

## 2021-06-23 MED ORDER — OXYCODONE HCL 5 MG PO TABS
5.0000 mg | ORAL_TABLET | ORAL | 0 refills | Status: AC
  Filled 2021-06-23: qty 30, 5d supply, fill #0

## 2021-06-23 MED ORDER — CELECOXIB 200 MG PO CAPS
ORAL_CAPSULE | ORAL | 0 refills | Status: AC
  Filled 2021-06-23: qty 1, 1d supply, fill #0

## 2021-06-23 MED ORDER — SULFAMETHOXAZOLE-TRIMETHOPRIM 800-160 MG PO TABS
1.0000 | ORAL_TABLET | Freq: Two times a day (BID) | ORAL | 0 refills | Status: AC
  Filled 2021-06-23: qty 10, 5d supply, fill #0

## 2021-06-23 MED ORDER — GABAPENTIN 300 MG PO CAPS
300.0000 mg | ORAL_CAPSULE | Freq: Three times a day (TID) | ORAL | 0 refills | Status: AC
  Filled 2021-06-23: qty 21, 7d supply, fill #0

## 2021-07-08 DIAGNOSIS — N6092 Unspecified benign mammary dysplasia of left breast: Secondary | ICD-10-CM | POA: Diagnosis not present

## 2021-07-08 DIAGNOSIS — N6031 Fibrosclerosis of right breast: Secondary | ICD-10-CM | POA: Diagnosis not present

## 2021-07-08 DIAGNOSIS — N6082 Other benign mammary dysplasias of left breast: Secondary | ICD-10-CM | POA: Diagnosis not present

## 2021-07-08 DIAGNOSIS — N6032 Fibrosclerosis of left breast: Secondary | ICD-10-CM | POA: Diagnosis not present

## 2021-07-24 MED FILL — Sumatriptan Succinate Tab 50 MG: ORAL | 30 days supply | Qty: 10 | Fill #4 | Status: AC

## 2021-07-25 ENCOUNTER — Other Ambulatory Visit (HOSPITAL_COMMUNITY): Payer: Self-pay

## 2021-07-28 ENCOUNTER — Other Ambulatory Visit (HOSPITAL_COMMUNITY): Payer: Self-pay

## 2021-07-28 MED ORDER — TERBINAFINE HCL 250 MG PO TABS
250.0000 mg | ORAL_TABLET | Freq: Every day | ORAL | 0 refills | Status: AC
  Filled 2021-07-28: qty 14, 14d supply, fill #0

## 2021-08-23 ENCOUNTER — Other Ambulatory Visit (HOSPITAL_COMMUNITY): Payer: Self-pay

## 2021-09-07 ENCOUNTER — Other Ambulatory Visit (HOSPITAL_COMMUNITY): Payer: Self-pay

## 2021-09-07 DIAGNOSIS — B353 Tinea pedis: Secondary | ICD-10-CM | POA: Diagnosis not present

## 2021-09-07 MED ORDER — KETOCONAZOLE 2 % EX CREA
TOPICAL_CREAM | CUTANEOUS | 0 refills | Status: AC
  Filled 2021-09-07: qty 30, 30d supply, fill #0

## 2021-09-20 DIAGNOSIS — F422 Mixed obsessional thoughts and acts: Secondary | ICD-10-CM | POA: Diagnosis not present

## 2021-09-20 DIAGNOSIS — F900 Attention-deficit hyperactivity disorder, predominantly inattentive type: Secondary | ICD-10-CM | POA: Diagnosis not present

## 2021-09-20 DIAGNOSIS — F331 Major depressive disorder, recurrent, moderate: Secondary | ICD-10-CM | POA: Diagnosis not present

## 2021-09-21 ENCOUNTER — Other Ambulatory Visit (HOSPITAL_COMMUNITY): Payer: Self-pay

## 2021-09-22 ENCOUNTER — Other Ambulatory Visit (HOSPITAL_COMMUNITY): Payer: Self-pay

## 2021-09-22 MED ORDER — FETZIMA 120 MG PO CP24
1.0000 | ORAL_CAPSULE | Freq: Every day | ORAL | 2 refills | Status: DC
  Filled 2021-09-22: qty 90, 90d supply, fill #0
  Filled 2021-12-21: qty 90, 90d supply, fill #1
  Filled 2022-03-22: qty 90, 90d supply, fill #2

## 2021-09-23 ENCOUNTER — Other Ambulatory Visit: Payer: Self-pay | Admitting: Internal Medicine

## 2021-09-23 ENCOUNTER — Other Ambulatory Visit (HOSPITAL_COMMUNITY): Payer: Self-pay

## 2021-09-23 DIAGNOSIS — F325 Major depressive disorder, single episode, in full remission: Secondary | ICD-10-CM | POA: Diagnosis not present

## 2021-09-23 DIAGNOSIS — Z6829 Body mass index (BMI) 29.0-29.9, adult: Secondary | ICD-10-CM | POA: Diagnosis not present

## 2021-09-23 DIAGNOSIS — Z8249 Family history of ischemic heart disease and other diseases of the circulatory system: Secondary | ICD-10-CM | POA: Diagnosis not present

## 2021-09-23 DIAGNOSIS — E785 Hyperlipidemia, unspecified: Secondary | ICD-10-CM | POA: Diagnosis not present

## 2021-09-23 MED ORDER — ATORVASTATIN CALCIUM 20 MG PO TABS
20.0000 mg | ORAL_TABLET | Freq: Every day | ORAL | 1 refills | Status: DC
  Filled 2021-09-23: qty 30, 30d supply, fill #0
  Filled 2021-10-18: qty 30, 30d supply, fill #1

## 2021-09-30 ENCOUNTER — Other Ambulatory Visit (HOSPITAL_COMMUNITY): Payer: Self-pay

## 2021-09-30 MED ORDER — WEGOVY 0.25 MG/0.5ML ~~LOC~~ SOAJ
0.2500 mg | SUBCUTANEOUS | 0 refills | Status: DC
  Filled 2021-09-30: qty 2, 28d supply, fill #0

## 2021-10-03 ENCOUNTER — Other Ambulatory Visit (HOSPITAL_COMMUNITY): Payer: Self-pay

## 2021-10-05 ENCOUNTER — Other Ambulatory Visit (HOSPITAL_COMMUNITY): Payer: Self-pay

## 2021-10-05 MED ORDER — SUMATRIPTAN SUCCINATE 50 MG PO TABS
ORAL_TABLET | ORAL | 5 refills | Status: AC
  Filled 2021-10-05: qty 10, 30d supply, fill #0

## 2021-10-13 ENCOUNTER — Other Ambulatory Visit (HOSPITAL_COMMUNITY): Payer: Self-pay

## 2021-10-18 ENCOUNTER — Other Ambulatory Visit (HOSPITAL_COMMUNITY): Payer: Self-pay

## 2021-10-19 ENCOUNTER — Other Ambulatory Visit (HOSPITAL_COMMUNITY): Payer: Self-pay

## 2021-10-19 MED ORDER — SUMATRIPTAN SUCCINATE 50 MG PO TABS
50.0000 mg | ORAL_TABLET | ORAL | 5 refills | Status: AC
  Filled 2021-10-19: qty 10, 30d supply, fill #0
  Filled 2021-11-20: qty 10, 30d supply, fill #1

## 2021-10-24 ENCOUNTER — Other Ambulatory Visit (HOSPITAL_COMMUNITY): Payer: Self-pay

## 2021-10-25 ENCOUNTER — Other Ambulatory Visit (HOSPITAL_COMMUNITY): Payer: Self-pay

## 2021-10-25 MED ORDER — WEGOVY 0.5 MG/0.5ML ~~LOC~~ SOAJ
0.5000 mg | SUBCUTANEOUS | 0 refills | Status: DC
  Filled 2021-10-25: qty 2, 28d supply, fill #0

## 2021-10-31 ENCOUNTER — Ambulatory Visit
Admission: RE | Admit: 2021-10-31 | Discharge: 2021-10-31 | Disposition: A | Discharge status: Home / Self Care | Payer: No Typology Code available for payment source | Source: Ambulatory Visit | Attending: Internal Medicine | Admitting: Internal Medicine

## 2021-10-31 DIAGNOSIS — E785 Hyperlipidemia, unspecified: Secondary | ICD-10-CM

## 2021-11-16 ENCOUNTER — Other Ambulatory Visit (HOSPITAL_COMMUNITY): Payer: Self-pay

## 2021-11-16 MED ORDER — WEGOVY 0.5 MG/0.5ML ~~LOC~~ SOAJ
0.5000 mg | SUBCUTANEOUS | 0 refills | Status: AC
  Filled 2021-11-16: qty 2, 28d supply, fill #0

## 2021-11-17 ENCOUNTER — Other Ambulatory Visit (HOSPITAL_COMMUNITY): Payer: Self-pay

## 2021-11-17 MED ORDER — ATORVASTATIN CALCIUM 20 MG PO TABS
20.0000 mg | ORAL_TABLET | Freq: Every day | ORAL | 0 refills | Status: DC
  Filled 2021-11-17: qty 30, 30d supply, fill #0

## 2021-11-21 ENCOUNTER — Other Ambulatory Visit (HOSPITAL_COMMUNITY): Payer: Self-pay

## 2021-12-06 DIAGNOSIS — F331 Major depressive disorder, recurrent, moderate: Secondary | ICD-10-CM | POA: Diagnosis not present

## 2021-12-06 DIAGNOSIS — F422 Mixed obsessional thoughts and acts: Secondary | ICD-10-CM | POA: Diagnosis not present

## 2021-12-06 DIAGNOSIS — F900 Attention-deficit hyperactivity disorder, predominantly inattentive type: Secondary | ICD-10-CM | POA: Diagnosis not present

## 2021-12-13 ENCOUNTER — Other Ambulatory Visit (HOSPITAL_COMMUNITY): Payer: Self-pay

## 2021-12-13 MED ORDER — TRAZODONE HCL 50 MG PO TABS
50.0000 mg | ORAL_TABLET | Freq: Every day | ORAL | 1 refills | Status: DC
  Filled 2021-12-13: qty 180, 90d supply, fill #0
  Filled 2022-05-14: qty 180, 90d supply, fill #1

## 2021-12-13 MED ORDER — WEGOVY 1 MG/0.5ML ~~LOC~~ SOAJ
1.0000 mg | SUBCUTANEOUS | 0 refills | Status: AC
  Filled 2021-12-13: qty 2, 28d supply, fill #0

## 2021-12-14 ENCOUNTER — Other Ambulatory Visit (HOSPITAL_COMMUNITY): Payer: Self-pay

## 2021-12-15 ENCOUNTER — Other Ambulatory Visit (HOSPITAL_COMMUNITY): Payer: Self-pay

## 2021-12-20 ENCOUNTER — Other Ambulatory Visit (HOSPITAL_COMMUNITY): Payer: Self-pay

## 2021-12-20 DIAGNOSIS — Z1231 Encounter for screening mammogram for malignant neoplasm of breast: Secondary | ICD-10-CM | POA: Diagnosis not present

## 2021-12-20 DIAGNOSIS — F325 Major depressive disorder, single episode, in full remission: Secondary | ICD-10-CM | POA: Diagnosis not present

## 2021-12-20 DIAGNOSIS — Z1211 Encounter for screening for malignant neoplasm of colon: Secondary | ICD-10-CM | POA: Diagnosis not present

## 2021-12-20 DIAGNOSIS — G43909 Migraine, unspecified, not intractable, without status migrainosus: Secondary | ICD-10-CM | POA: Diagnosis not present

## 2021-12-20 DIAGNOSIS — Z0000 Encounter for general adult medical examination without abnormal findings: Secondary | ICD-10-CM | POA: Diagnosis not present

## 2021-12-20 DIAGNOSIS — E785 Hyperlipidemia, unspecified: Secondary | ICD-10-CM | POA: Diagnosis not present

## 2021-12-20 MED ORDER — SUMATRIPTAN SUCCINATE 50 MG PO TABS
50.0000 mg | ORAL_TABLET | ORAL | 6 refills | Status: DC
  Filled 2021-12-20: qty 9, 30d supply, fill #0
  Filled 2022-02-22: qty 9, 30d supply, fill #1
  Filled 2022-03-20: qty 9, 30d supply, fill #2
  Filled 2022-04-05 – 2022-05-14 (×2): qty 9, 30d supply, fill #3
  Filled 2022-07-07: qty 9, 30d supply, fill #4
  Filled 2022-08-29: qty 9, 30d supply, fill #5
  Filled 2022-10-02: qty 9, 30d supply, fill #6

## 2021-12-20 MED ORDER — ATORVASTATIN CALCIUM 20 MG PO TABS
20.0000 mg | ORAL_TABLET | Freq: Every day | ORAL | 5 refills | Status: DC
  Filled 2021-12-20: qty 30, 30d supply, fill #0
  Filled 2022-02-05: qty 30, 30d supply, fill #1
  Filled 2022-03-06: qty 30, 30d supply, fill #2
  Filled 2022-04-05: qty 30, 30d supply, fill #3
  Filled 2022-05-03: qty 30, 30d supply, fill #4
  Filled 2022-06-05: qty 30, 30d supply, fill #5

## 2021-12-21 ENCOUNTER — Other Ambulatory Visit (HOSPITAL_COMMUNITY): Payer: Self-pay

## 2022-01-06 DIAGNOSIS — Z1231 Encounter for screening mammogram for malignant neoplasm of breast: Secondary | ICD-10-CM | POA: Diagnosis not present

## 2022-01-06 DIAGNOSIS — Z01419 Encounter for gynecological examination (general) (routine) without abnormal findings: Secondary | ICD-10-CM | POA: Diagnosis not present

## 2022-01-06 DIAGNOSIS — Z6827 Body mass index (BMI) 27.0-27.9, adult: Secondary | ICD-10-CM | POA: Diagnosis not present

## 2022-01-11 ENCOUNTER — Other Ambulatory Visit (HOSPITAL_COMMUNITY): Payer: Self-pay

## 2022-01-11 MED ORDER — WEGOVY 1.7 MG/0.75ML ~~LOC~~ SOAJ
1.7000 mg | SUBCUTANEOUS | 0 refills | Status: DC
  Filled 2022-01-11 – 2022-01-18 (×2): qty 3, 28d supply, fill #0

## 2022-01-18 ENCOUNTER — Other Ambulatory Visit (HOSPITAL_COMMUNITY): Payer: Self-pay

## 2022-02-06 ENCOUNTER — Other Ambulatory Visit (HOSPITAL_COMMUNITY): Payer: Self-pay

## 2022-02-07 ENCOUNTER — Other Ambulatory Visit (HOSPITAL_COMMUNITY): Payer: Self-pay

## 2022-02-13 ENCOUNTER — Other Ambulatory Visit (HOSPITAL_COMMUNITY): Payer: Self-pay

## 2022-02-14 ENCOUNTER — Other Ambulatory Visit (HOSPITAL_COMMUNITY): Payer: Self-pay

## 2022-02-14 MED ORDER — WEGOVY 2.4 MG/0.75ML ~~LOC~~ SOAJ
2.4000 mg | SUBCUTANEOUS | 0 refills | Status: DC
  Filled 2022-02-14: qty 3, 28d supply, fill #0

## 2022-02-22 ENCOUNTER — Other Ambulatory Visit (HOSPITAL_COMMUNITY): Payer: Self-pay

## 2022-03-06 ENCOUNTER — Other Ambulatory Visit (HOSPITAL_COMMUNITY): Payer: Self-pay

## 2022-03-13 ENCOUNTER — Other Ambulatory Visit (HOSPITAL_COMMUNITY): Payer: Self-pay

## 2022-03-13 MED ORDER — WEGOVY 2.4 MG/0.75ML ~~LOC~~ SOAJ
2.4000 mg | SUBCUTANEOUS | 0 refills | Status: DC
  Filled 2022-03-13: qty 3, 28d supply, fill #0

## 2022-03-20 ENCOUNTER — Other Ambulatory Visit (HOSPITAL_COMMUNITY): Payer: Self-pay

## 2022-03-23 ENCOUNTER — Other Ambulatory Visit (HOSPITAL_COMMUNITY): Payer: Self-pay

## 2022-03-28 DIAGNOSIS — F900 Attention-deficit hyperactivity disorder, predominantly inattentive type: Secondary | ICD-10-CM | POA: Diagnosis not present

## 2022-03-28 DIAGNOSIS — F422 Mixed obsessional thoughts and acts: Secondary | ICD-10-CM | POA: Diagnosis not present

## 2022-03-28 DIAGNOSIS — F331 Major depressive disorder, recurrent, moderate: Secondary | ICD-10-CM | POA: Diagnosis not present

## 2022-04-05 ENCOUNTER — Other Ambulatory Visit (HOSPITAL_COMMUNITY): Payer: Self-pay

## 2022-04-06 ENCOUNTER — Other Ambulatory Visit (HOSPITAL_COMMUNITY): Payer: Self-pay

## 2022-04-06 MED ORDER — WEGOVY 2.4 MG/0.75ML ~~LOC~~ SOAJ
2.4000 mg | SUBCUTANEOUS | 0 refills | Status: DC
  Filled 2022-04-06: qty 3, 28d supply, fill #0

## 2022-05-03 ENCOUNTER — Other Ambulatory Visit (HOSPITAL_COMMUNITY): Payer: Self-pay

## 2022-05-04 ENCOUNTER — Other Ambulatory Visit (HOSPITAL_COMMUNITY): Payer: Self-pay

## 2022-05-04 MED ORDER — WEGOVY 2.4 MG/0.75ML ~~LOC~~ SOAJ
2.4000 mg | SUBCUTANEOUS | 0 refills | Status: AC
  Filled 2022-05-04: qty 3, 28d supply, fill #0

## 2022-05-10 ENCOUNTER — Other Ambulatory Visit (HOSPITAL_COMMUNITY): Payer: Self-pay

## 2022-05-15 ENCOUNTER — Other Ambulatory Visit (HOSPITAL_COMMUNITY): Payer: Self-pay

## 2022-05-30 DIAGNOSIS — D2262 Melanocytic nevi of left upper limb, including shoulder: Secondary | ICD-10-CM | POA: Diagnosis not present

## 2022-05-30 DIAGNOSIS — D225 Melanocytic nevi of trunk: Secondary | ICD-10-CM | POA: Diagnosis not present

## 2022-05-30 DIAGNOSIS — D2372 Other benign neoplasm of skin of left lower limb, including hip: Secondary | ICD-10-CM | POA: Diagnosis not present

## 2022-05-30 DIAGNOSIS — D485 Neoplasm of uncertain behavior of skin: Secondary | ICD-10-CM | POA: Diagnosis not present

## 2022-05-30 DIAGNOSIS — D2261 Melanocytic nevi of right upper limb, including shoulder: Secondary | ICD-10-CM | POA: Diagnosis not present

## 2022-05-30 DIAGNOSIS — L812 Freckles: Secondary | ICD-10-CM | POA: Diagnosis not present

## 2022-05-30 DIAGNOSIS — L720 Epidermal cyst: Secondary | ICD-10-CM | POA: Diagnosis not present

## 2022-06-01 ENCOUNTER — Other Ambulatory Visit (HOSPITAL_COMMUNITY): Payer: Self-pay

## 2022-06-01 MED ORDER — WEGOVY 2.4 MG/0.75ML ~~LOC~~ SOAJ
2.4000 mg | SUBCUTANEOUS | 0 refills | Status: AC
  Filled 2022-06-01 – 2022-06-07 (×2): qty 3, 28d supply, fill #0

## 2022-06-05 ENCOUNTER — Other Ambulatory Visit (HOSPITAL_COMMUNITY): Payer: Self-pay

## 2022-06-07 ENCOUNTER — Other Ambulatory Visit (HOSPITAL_COMMUNITY): Payer: Self-pay

## 2022-06-21 ENCOUNTER — Other Ambulatory Visit (HOSPITAL_COMMUNITY): Payer: Self-pay

## 2022-06-21 MED ORDER — LEVOMILNACIPRAN HCL ER 120 MG PO CP24
1.0000 | ORAL_CAPSULE | Freq: Every day | ORAL | 1 refills | Status: DC
  Filled 2022-06-21: qty 90, 90d supply, fill #0
  Filled 2022-09-15 – 2022-09-19 (×2): qty 90, 90d supply, fill #1

## 2022-06-23 ENCOUNTER — Other Ambulatory Visit (HOSPITAL_COMMUNITY): Payer: Self-pay

## 2022-06-28 ENCOUNTER — Other Ambulatory Visit (HOSPITAL_COMMUNITY): Payer: Self-pay

## 2022-06-28 MED ORDER — WEGOVY 2.4 MG/0.75ML ~~LOC~~ SOAJ
2.4000 mg | SUBCUTANEOUS | 0 refills | Status: AC
  Filled 2022-06-28 – 2022-07-04 (×2): qty 3, 28d supply, fill #0

## 2022-07-02 ENCOUNTER — Other Ambulatory Visit (HOSPITAL_COMMUNITY): Payer: Self-pay

## 2022-07-03 ENCOUNTER — Other Ambulatory Visit (HOSPITAL_COMMUNITY): Payer: Self-pay

## 2022-07-03 MED ORDER — ATORVASTATIN CALCIUM 20 MG PO TABS
20.0000 mg | ORAL_TABLET | Freq: Every day | ORAL | 5 refills | Status: DC
  Filled 2022-07-03: qty 30, 30d supply, fill #0
  Filled 2022-08-03: qty 30, 30d supply, fill #1
  Filled 2022-09-01: qty 30, 30d supply, fill #2
  Filled 2022-10-02: qty 30, 30d supply, fill #3
  Filled 2022-11-07: qty 30, 30d supply, fill #4
  Filled 2022-12-12: qty 30, 30d supply, fill #5

## 2022-07-04 ENCOUNTER — Other Ambulatory Visit (HOSPITAL_COMMUNITY): Payer: Self-pay

## 2022-07-06 DIAGNOSIS — F900 Attention-deficit hyperactivity disorder, predominantly inattentive type: Secondary | ICD-10-CM | POA: Diagnosis not present

## 2022-07-06 DIAGNOSIS — F331 Major depressive disorder, recurrent, moderate: Secondary | ICD-10-CM | POA: Diagnosis not present

## 2022-07-06 DIAGNOSIS — F422 Mixed obsessional thoughts and acts: Secondary | ICD-10-CM | POA: Diagnosis not present

## 2022-07-07 ENCOUNTER — Other Ambulatory Visit (HOSPITAL_COMMUNITY): Payer: Self-pay

## 2022-07-31 ENCOUNTER — Other Ambulatory Visit (HOSPITAL_COMMUNITY): Payer: Self-pay

## 2022-07-31 MED ORDER — WEGOVY 2.4 MG/0.75ML ~~LOC~~ SOAJ
2.4000 mg | SUBCUTANEOUS | 0 refills | Status: AC
  Filled 2022-07-31: qty 3, 28d supply, fill #0

## 2022-08-04 ENCOUNTER — Other Ambulatory Visit (HOSPITAL_COMMUNITY): Payer: Self-pay

## 2022-08-22 ENCOUNTER — Other Ambulatory Visit (HOSPITAL_COMMUNITY): Payer: Self-pay

## 2022-08-22 MED ORDER — ALPRAZOLAM 0.5 MG PO TABS
ORAL_TABLET | ORAL | 0 refills | Status: DC
  Filled 2022-08-22: qty 3, 2d supply, fill #0

## 2022-08-24 ENCOUNTER — Other Ambulatory Visit (HOSPITAL_COMMUNITY): Payer: Self-pay

## 2022-08-24 MED ORDER — METFORMIN HCL ER 500 MG PO TB24
500.0000 mg | ORAL_TABLET | Freq: Every day | ORAL | 0 refills | Status: AC
  Filled 2022-08-24: qty 30, 30d supply, fill #0

## 2022-08-25 ENCOUNTER — Other Ambulatory Visit (HOSPITAL_COMMUNITY): Payer: Self-pay

## 2022-08-28 ENCOUNTER — Other Ambulatory Visit (HOSPITAL_COMMUNITY): Payer: Self-pay

## 2022-08-28 MED ORDER — WEGOVY 2.4 MG/0.75ML ~~LOC~~ SOAJ
2.4000 mg | SUBCUTANEOUS | 0 refills | Status: AC
  Filled 2022-08-28: qty 3, 28d supply, fill #0

## 2022-08-29 ENCOUNTER — Other Ambulatory Visit: Payer: Self-pay

## 2022-08-29 ENCOUNTER — Other Ambulatory Visit (HOSPITAL_COMMUNITY): Payer: Self-pay

## 2022-09-01 ENCOUNTER — Other Ambulatory Visit (HOSPITAL_COMMUNITY): Payer: Self-pay

## 2022-09-15 ENCOUNTER — Other Ambulatory Visit (HOSPITAL_COMMUNITY): Payer: Self-pay

## 2022-09-18 ENCOUNTER — Other Ambulatory Visit (HOSPITAL_COMMUNITY): Payer: Self-pay

## 2022-09-18 MED ORDER — PEG 3350-KCL-NA BICARB-NACL 420 G PO SOLR
420.0000 g | Freq: Every evening | ORAL | 0 refills | Status: AC
  Filled 2022-09-18: qty 4000, 1d supply, fill #0

## 2022-09-19 ENCOUNTER — Other Ambulatory Visit (HOSPITAL_COMMUNITY): Payer: Self-pay

## 2022-09-21 DIAGNOSIS — K644 Residual hemorrhoidal skin tags: Secondary | ICD-10-CM | POA: Diagnosis not present

## 2022-09-21 DIAGNOSIS — K648 Other hemorrhoids: Secondary | ICD-10-CM | POA: Diagnosis not present

## 2022-09-21 DIAGNOSIS — K635 Polyp of colon: Secondary | ICD-10-CM | POA: Diagnosis not present

## 2022-09-21 DIAGNOSIS — K5730 Diverticulosis of large intestine without perforation or abscess without bleeding: Secondary | ICD-10-CM | POA: Diagnosis not present

## 2022-09-21 DIAGNOSIS — Z1211 Encounter for screening for malignant neoplasm of colon: Secondary | ICD-10-CM | POA: Diagnosis not present

## 2022-09-25 ENCOUNTER — Other Ambulatory Visit (HOSPITAL_COMMUNITY): Payer: Self-pay

## 2022-09-25 MED ORDER — WEGOVY 2.4 MG/0.75ML ~~LOC~~ SOAJ
2.4000 mg | SUBCUTANEOUS | 0 refills | Status: AC
  Filled 2022-09-25: qty 3, 28d supply, fill #0

## 2022-10-11 DIAGNOSIS — F422 Mixed obsessional thoughts and acts: Secondary | ICD-10-CM | POA: Diagnosis not present

## 2022-10-11 DIAGNOSIS — F900 Attention-deficit hyperactivity disorder, predominantly inattentive type: Secondary | ICD-10-CM | POA: Diagnosis not present

## 2022-10-11 DIAGNOSIS — F331 Major depressive disorder, recurrent, moderate: Secondary | ICD-10-CM | POA: Diagnosis not present

## 2022-10-19 ENCOUNTER — Other Ambulatory Visit: Payer: Self-pay

## 2022-10-19 ENCOUNTER — Other Ambulatory Visit (HOSPITAL_COMMUNITY): Payer: Self-pay

## 2022-10-19 MED ORDER — APLENZIN 174 MG PO TB24
174.0000 mg | ORAL_TABLET | Freq: Every day | ORAL | 3 refills | Status: DC
  Filled 2022-10-19: qty 30, 30d supply, fill #0

## 2022-10-20 ENCOUNTER — Other Ambulatory Visit (HOSPITAL_COMMUNITY): Payer: Self-pay

## 2022-10-23 ENCOUNTER — Other Ambulatory Visit (HOSPITAL_COMMUNITY): Payer: Self-pay

## 2022-10-23 MED ORDER — WEGOVY 2.4 MG/0.75ML ~~LOC~~ SOAJ
2.4000 mg | SUBCUTANEOUS | 0 refills | Status: AC
  Filled 2022-10-23: qty 3, 28d supply, fill #0

## 2022-10-25 ENCOUNTER — Other Ambulatory Visit (HOSPITAL_COMMUNITY): Payer: Self-pay

## 2022-10-26 ENCOUNTER — Other Ambulatory Visit (HOSPITAL_COMMUNITY): Payer: Self-pay

## 2022-10-26 MED ORDER — BUPROPION HCL ER (XL) 150 MG PO TB24
150.0000 mg | ORAL_TABLET | ORAL | 3 refills | Status: DC
  Filled 2022-10-26: qty 30, 30d supply, fill #0
  Filled 2022-11-29: qty 30, 30d supply, fill #1
  Filled 2022-12-27: qty 30, 30d supply, fill #2
  Filled 2023-02-03: qty 30, 30d supply, fill #3

## 2022-11-07 ENCOUNTER — Other Ambulatory Visit: Payer: Self-pay

## 2022-11-07 ENCOUNTER — Other Ambulatory Visit (HOSPITAL_COMMUNITY): Payer: Self-pay

## 2022-11-08 ENCOUNTER — Other Ambulatory Visit (HOSPITAL_COMMUNITY): Payer: Self-pay

## 2022-11-08 MED ORDER — SUMATRIPTAN SUCCINATE 50 MG PO TABS
50.0000 mg | ORAL_TABLET | ORAL | 6 refills | Status: AC
  Filled 2022-11-08: qty 9, 30d supply, fill #0
  Filled 2023-01-09: qty 9, 30d supply, fill #1
  Filled 2023-02-09: qty 9, 30d supply, fill #2
  Filled 2023-05-13: qty 9, 30d supply, fill #3
  Filled 2023-07-11: qty 9, 30d supply, fill #4
  Filled 2023-09-05: qty 9, 30d supply, fill #5

## 2022-11-22 ENCOUNTER — Other Ambulatory Visit (HOSPITAL_COMMUNITY): Payer: Self-pay

## 2022-11-22 MED ORDER — METFORMIN HCL 500 MG PO TABS
500.0000 mg | ORAL_TABLET | Freq: Every day | ORAL | 1 refills | Status: AC
  Filled 2022-11-22: qty 90, 90d supply, fill #0

## 2022-12-11 ENCOUNTER — Other Ambulatory Visit (HOSPITAL_COMMUNITY): Payer: Self-pay

## 2022-12-12 ENCOUNTER — Other Ambulatory Visit (HOSPITAL_COMMUNITY): Payer: Self-pay

## 2022-12-12 ENCOUNTER — Other Ambulatory Visit: Payer: Self-pay

## 2022-12-14 ENCOUNTER — Other Ambulatory Visit (HOSPITAL_COMMUNITY): Payer: Self-pay

## 2022-12-14 MED ORDER — TRAZODONE HCL 50 MG PO TABS
50.0000 mg | ORAL_TABLET | Freq: Every day | ORAL | 2 refills | Status: DC
  Filled 2022-12-14: qty 180, 90d supply, fill #0
  Filled 2023-06-11: qty 180, 90d supply, fill #1
  Filled 2023-12-13: qty 180, 90d supply, fill #2

## 2022-12-21 ENCOUNTER — Other Ambulatory Visit (HOSPITAL_COMMUNITY): Payer: Self-pay

## 2022-12-26 ENCOUNTER — Other Ambulatory Visit (HOSPITAL_COMMUNITY): Payer: Self-pay

## 2022-12-27 ENCOUNTER — Other Ambulatory Visit (HOSPITAL_COMMUNITY): Payer: Self-pay

## 2022-12-27 MED ORDER — FETZIMA 120 MG PO CP24
1.0000 | ORAL_CAPSULE | Freq: Every day | ORAL | 3 refills | Status: DC
  Filled 2022-12-27: qty 90, 90d supply, fill #0
  Filled 2023-03-20: qty 90, 90d supply, fill #1
  Filled 2023-06-20 – 2023-06-22 (×2): qty 90, 90d supply, fill #2
  Filled 2023-09-11: qty 90, 90d supply, fill #3

## 2023-01-04 ENCOUNTER — Other Ambulatory Visit (HOSPITAL_COMMUNITY): Payer: Self-pay

## 2023-01-04 MED ORDER — ATORVASTATIN CALCIUM 20 MG PO TABS
20.0000 mg | ORAL_TABLET | Freq: Every day | ORAL | 5 refills | Status: DC
  Filled 2023-01-04 – 2023-01-09 (×2): qty 30, 30d supply, fill #0
  Filled 2023-02-09: qty 30, 30d supply, fill #1
  Filled 2023-03-12: qty 30, 30d supply, fill #2
  Filled 2023-04-11: qty 30, 30d supply, fill #3
  Filled 2023-05-13: qty 30, 30d supply, fill #4
  Filled 2023-06-20: qty 30, 30d supply, fill #5

## 2023-01-09 ENCOUNTER — Other Ambulatory Visit (HOSPITAL_COMMUNITY): Payer: Self-pay

## 2023-01-19 ENCOUNTER — Encounter: Payer: Self-pay | Admitting: Adult Health

## 2023-01-19 ENCOUNTER — Ambulatory Visit (INDEPENDENT_AMBULATORY_CARE_PROVIDER_SITE_OTHER): Payer: Commercial Managed Care - PPO | Admitting: Adult Health

## 2023-01-19 VITALS — BP 143/90 | HR 90 | Ht 66.0 in | Wt 153.0 lb

## 2023-01-19 DIAGNOSIS — F331 Major depressive disorder, recurrent, moderate: Secondary | ICD-10-CM

## 2023-01-19 DIAGNOSIS — F411 Generalized anxiety disorder: Secondary | ICD-10-CM

## 2023-01-19 NOTE — Progress Notes (Signed)
[facility] MD/PA/NP Initial Note  01/19/2023 5:11 PM Robin Stevenson  MRN:  [id number]  Chief Complaint:   HPI:   Patient seen today for initial psychiatric evaluation.   Describes mood today as "ok". Pleasant. Denies tearfulness. Mood symptoms - reports depression and irritability - "feeling blah". Denies anxiety. Denies panic attacks. Reports worry, rumination, and over thinking. Denies obsessive thoughts and acts. Denies irrational thoughts and fears. Reports feeling a little lost with her daughter leaving for college in the fall. Mood is lower. Stating "I would like to feel better". Previously seen at [facility] for the past 15 years - [name]. Has been on current medication for 3 years and feels like it has been somewhat helpful, but would like to consider other options with current mood symptoms.  Stable interest and motivation. Taking medications as prescribed. Reports some concerns about daughter leaving for college in the fall.  Energy levels stable. Active, walking 2 to 3 miles a day. Enjoys some usual interests and activities. Married. Lives with husband of 23 years - daughter. Spending time with family. Appetite adequate. Weight loss - 30 pounds with wegovy. Sleeps well most nights. Averages 6 hours - 5 solid then wakes up for 2 hours and then falls back to sleep for an 1 hour. Focus and concentration stable. Completing tasks. Managing aspects of household.  Denies SI or HI.  Denies AH or VH. Denies self harm. Denies substance use.  Previous medication trials:  Prozac, Lexapro, Zoloft, Wellbutrin, Pristiq, Fetzima x 3 years, and  Aplenzin,   Visit Diagnosis:    ICD-10-CM   1. Major depressive disorder, recurrent episode, moderate (HCC)  F33.1     2. Generalized anxiety disorder  F41.1       Past Psychiatric History: Denies psychiatric hospitalization. Reports ECT 10 years.   Past Medical History:  Past Medical History:  Diagnosis Date    Depression    takes Welbutrin and Fetzima   GERD (gastroesophageal reflux disease)    takes nexium    Past Surgical History:  Procedure Laterality Date   CESAREAN SECTION     TENNIS ELBOW RELEASE/NIRSCHEL PROCEDURE Right 05/04/2017   Procedure: Right elbow extensor carpi radialis brevis debridement with repair reconstruction as necessary;  Surgeon: Dominica Severin, MD;  Location: [facility];  Service: Orthopedics;  Laterality: Right;  90 mins    Family Psychiatric History: Reports family history of mental illness - mother.   Family History: No family history on file.  Social History:  Social History   Socioeconomic History   Marital status: Married    Spouse name: Not on file   Number of children: Not on file   Years of education: Not on file   Highest education level: Not on file  Occupational History   Not on file  Tobacco Use   Smoking status: Never   Smokeless tobacco: Never  Substance and Sexual Activity   Alcohol use: Yes    Alcohol/week: 5.0 standard drinks of alcohol    Types: 5 Glasses of wine per week   Drug use: No   Sexual activity: Yes  Other Topics Concern   Not on file  Social History Narrative   Not on file   Social Determinants of Health   Financial Resource Strain: Not on file  Food Insecurity: Not on file  Transportation Needs: Not on file  Physical Activity: Not on file  Stress: Not on file  Social Connections: Not on file    Allergies:  Allergies  Allergen Reactions   Brewers Yeast Rash    Metabolic Disorder Labs: No results found for: "HGBA1C", "MPG" No results found for: "PROLACTIN" No results found for: "CHOL", "TRIG", "HDL", "CHOLHDL", "VLDL", "LDLCALC" No results found for: "TSH"  Therapeutic Level Labs: No results found for: "LITHIUM" No results found for: "VALPROATE" No results found for: "CBMZ"  Current Medications: Current Outpatient Medications  Medication Sig Dispense Refill   ALPRAZolam (XANAX) 0.5  MG tablet Take 1 tablet at bedtime the night before procedure. Take 1 tablet 1 hour before arrival. Bring third tablet with you to procedure. 3 tablet 0   atorvastatin (LIPITOR) 20 MG tablet Take 1 tablet (20 mg total) by mouth daily. 30 tablet 5   atovaquone-proguanil (MALARONE) 250-100 MG TABS tablet Take 1 tablet by mouth once daily begin 2 days prearrival,take daily during stay & continue for 7 days posttravel for malaria prevention 24 tablet 0   azithromycin (ZITHROMAX) 500 MG tablet For non bloody diarrhea,take 2 tabs on day 1, if resolved,stop med,If diarrhea persists 1 tab on day 2 & 3. For bloody diarrhea,2 tabs on day 1 & 1 tab on day 2 & 3 4 tablet 0   buPROPion (WELLBUTRIN XL) 150 MG 24 hr tablet   3   buPROPion (WELLBUTRIN XL) 150 MG 24 hr tablet Take 1 tablet (150 mg total) by mouth every morning. 30 tablet 3   BuPROPion HBr (APLENZIN) 174 MG TB24 Take 1 tablet (174 mg total) by mouth daily. 30 tablet 3   celecoxib (CELEBREX) 200 MG capsule Take 1 capsule by mouth single dose as directed. Take 1 hour prior to arrival for surgery 1 capsule 0   esomeprazole (NEXIUM) 20 MG capsule Take 20 mg by mouth once.     FETZIMA 120 MG CP24   3   fluocinonide ointment (LIDEX) 0.05 % Apply as directed to skin twice a day 30 g 0   gabapentin (NEURONTIN) 300 MG capsule Take 1 capsule (300 mg total) by mouth 3 (three) times daily. Take first dose 1 hour before arrival for surgery. 21 capsule 0   ketoconazole (NIZORAL) 2 % cream Apply a small amount to affected area on left foot/toe once a day 30 g 0   Levomilnacipran HCl ER (FETZIMA) 120 MG CP24 Take 1 capsule by mouth daily. 90 capsule 3   metFORMIN (GLUCOPHAGE) 500 MG tablet Take 1 tablet (500 mg total) by mouth daily. 90 tablet 1   metFORMIN (GLUCOPHAGE-XR) 500 MG 24 hr tablet Take 1 tablet (500 mg total) by mouth daily. Please do not fill if not picked up within 30 days. 30 tablet 0   methocarbamol (ROBAXIN) 500 MG tablet Take 1 tablet (500 mg total)  by mouth every 6 (six) hours as needed for muscle spasms. (Patient not taking: Reported on 05/22/2018) 45 tablet 0   ondansetron (ZOFRAN) 4 MG tablet Take 1 tablet (4 mg total) by mouth every 6 (six) hours for nausea. TAKE 4 tablets 15 minutes prior to arrival for surgery 30 tablet 0   ondansetron (ZOFRAN) 4 MG tablet Take 1 tablet  by mouth every 6 hours as needed for nausea 15 tablet 0   oxyCODONE (OXY IR/ROXICODONE) 5 MG immediate release tablet Take 1-2 tablet by mouth every six to eight hours as needed for Post Op Pain. DO NOT EXCEED  6 tablets in 24 hours. 30 tablet 0   polyethylene glycol (MIRALAX / GLYCOLAX) packet Take 17 g by mouth daily.     polyethylene glycol-electrolytes (NULYTELY)  420 g solution Take 4,000 mLs by mouth Nightly. 4000 mL 0   Semaglutide-Weight Management (WEGOVY) 2.4 MG/0.75ML SOAJ Inject 2.4 mg into the skin once a week for weight loss. 3 mL 0   sulfamethoxazole-trimethoprim (BACTRIM DS) 800-160 MG tablet Take 1 tablet by mouth 2 (two) times daily as directed. Start the day after surgery. 10 tablet 0   SUMAtriptan (IMITREX) 50 MG tablet TAKE 1 TABLET BY MOUTH AT ONSET OF HEADACHE, MAY REPEAT 1 DOSE AFTER 2 HOURS IF NEEDED 10 tablet 5   SUMAtriptan (IMITREX) 50 MG tablet Take 1 tablet by mouth at onset of headache, may repeat same dose once 2 hours later if needed 10 tablet 5   SUMAtriptan (IMITREX) 50 MG tablet Take 1 tablet (50 mg total) by mouth at the onset of headache. May repeat 1 dose after 2 hours if needed. 10 tablet 5   SUMAtriptan (IMITREX) 50 MG tablet Take 1 tablet (50 mg total) by mouth at onset of headache,may repeat 1 dose after 2 hours if needed, 2 to 3 times a week 9 tablet 6   terbinafine (LAMISIL) 250 MG tablet Take 1 tablet (250 mg total) by mouth daily for Tinea 14 tablet 0   traZODone (DESYREL) 50 MG tablet Take 50 mg by mouth at bedtime.     traZODone (DESYREL) 50 MG tablet TAKE 1 TO 2 TABLETS BY MOUTH AT BEDTIME 180 tablet 1   traZODone (DESYREL)  50 MG tablet Take 1-2 tablets (50-100 mg total) by mouth at bedtime. 180 tablet 1   traZODone (DESYREL) 50 MG tablet Take 1-2 tablets (50-100 mg total) by mouth at bedtime. 180 tablet 2   WEGOVY 0.5 MG/0.5ML SOAJ Inject 0.5 mg into the skin once a week for weight loss. 2 mL 0   WEGOVY 1 MG/0.5ML SOAJ Inject 1 mg into the skin once a week for weight loss 2 mL 0   WEGOVY 2.4 MG/0.75ML SOAJ Inject 2.4 mg into the skin once a week for weight loss. 3 mL 0   WEGOVY 2.4 MG/0.75ML SOAJ Inject 2.4 mg into the skin once a week fro weight loss 3 mL 0   WEGOVY 2.4 MG/0.75ML SOAJ Inject 2.4 mg into the skin once a week for weight loss 3 mL 0   WEGOVY 2.4 MG/0.75ML SOAJ Inject 2.4 mg,sub-q, into the skin once a week for weight loss 3 mL 0   WEGOVY 2.4 MG/0.75ML SOAJ Inject 2.4 mg into the skin once a week for weight loss. 3 mL 0   WEGOVY 2.4 MG/0.75ML SOAJ Inject 2.4 mg into the skin once a week for weight loss. 3 mL 0   WEGOVY 2.4 MG/0.75ML SOAJ Inject 2.4 mg into the skin once a week for weight loss 3 mL 0   No current facility-administered medications for this visit.    Medication Side Effects: none  Orders placed this visit:  No orders of the defined types were placed in this encounter.   Psychiatric Specialty Exam:  Review of Systems  Musculoskeletal:  Negative for gait problem.  Neurological:  Negative for tremors.  Psychiatric/Behavioral:         Please refer to HPI    There were no vitals taken for this visit.There is no height or weight on file to calculate BMI.  General Appearance: Casual and Neat  Eye Contact:  Good  Speech:  Clear and Coherent and Normal Rate  Volume:  Normal  Mood:  Depressed  Affect:  Appropriate and Congruent  Thought Process:  Coherent and Descriptions of Associations: Loose  Orientation:  Full (Time, Place, and Person)  Thought Content: Logical   Suicidal Thoughts:  No  Homicidal Thoughts:  No  Memory:  WNL  Judgement:  Good  Insight:  Good  Psychomotor  Activity:  Normal  Concentration:  Concentration: Good and Attention Span: Good  Recall:  Good  Fund of Knowledge: Good  Language: Good  Assets:  Communication Skills Desire for Improvement Financial Resources/Insurance Housing Intimacy Leisure Time Physical Health Resilience Social Support Talents/Skills Transportation Vocational/Educational  ADL's:  Intact  Cognition: WNL  Prognosis:  Good   Screenings:   Receiving Psychotherapy: No   Treatment Plan/Recommendations:  Plan:  Discussed Gene Sight testing and will initiate process before making further medication changes.    Wellbutrin XL 150mg  every morning Fetzima 120mg  daily  Patient seen for 30 minutes and time spent discussing treatment options.  RTC 2 weeks  Patient advised to contact office with any questions, adverse effects, or acute worsening in signs and symptoms.   Dorothyann Gibbs, NP

## 2023-01-24 ENCOUNTER — Other Ambulatory Visit (HOSPITAL_COMMUNITY): Payer: Self-pay

## 2023-01-24 MED ORDER — METFORMIN HCL 500 MG PO TABS
500.0000 mg | ORAL_TABLET | Freq: Two times a day (BID) | ORAL | 1 refills | Status: AC
  Filled 2023-01-24 – 2023-01-26 (×2): qty 180, 90d supply, fill #0
  Filled 2023-04-21: qty 180, 90d supply, fill #1

## 2023-01-25 ENCOUNTER — Other Ambulatory Visit (HOSPITAL_COMMUNITY): Payer: Self-pay

## 2023-01-26 ENCOUNTER — Other Ambulatory Visit (HOSPITAL_COMMUNITY): Payer: Self-pay

## 2023-02-05 ENCOUNTER — Encounter: Payer: Self-pay | Admitting: Adult Health

## 2023-02-05 ENCOUNTER — Ambulatory Visit (INDEPENDENT_AMBULATORY_CARE_PROVIDER_SITE_OTHER): Payer: Commercial Managed Care - PPO | Admitting: Adult Health

## 2023-02-05 DIAGNOSIS — F331 Major depressive disorder, recurrent, moderate: Secondary | ICD-10-CM | POA: Diagnosis not present

## 2023-02-05 DIAGNOSIS — F411 Generalized anxiety disorder: Secondary | ICD-10-CM

## 2023-02-05 MED ORDER — CARIPRAZINE HCL 1.5 MG PO CAPS: 1.5000 mg | ORAL_CAPSULE | Freq: Every day | ORAL | 0 refills | Status: AC

## 2023-02-05 NOTE — Progress Notes (Signed)
Robin Stevenson [id number] [birth year] 47 y.o.  Subjective:   Patient ID:  Robin Stevenson is a 47 y.o. (DOB [birth year]) female.  Chief Complaint: No chief complaint on file.   HPI [name] presents to the office today for follow-up of MDD and GAD.  Previously seen at [facility] for the past 15 years - [name].    Describes mood today as "ok". Pleasant. Denies tearfulness. Mood symptoms - reports depression and irritability. Reports anxiety. Denies panic attacks. Reports worry, rumination, and over thinking. Denies obsessive thoughts and acts. Denies irrational thoughts and fears. Mood is lower. Stating "I'm feeling about the same". Willing to consider other options with recent gene sight testing. Varying interest and motivation. Taking other medications as prescribed.   Energy levels stable. Active, walking 2 to 3 miles a day. Enjoys some usual interests and activities. Married. Lives with husband of 23 years - daughter. Spending time with family. Appetite adequate. Weight stable - 30 pounds with wegovy. Sleeps well most nights. Averages 6 hours - 5 solid then wakes up for 2 hours and then falls back to sleep for an 1 hour. Focus and concentration stable. Completing tasks. Managing aspects of household.  Denies SI or HI.  Denies AH or VH. Denies self harm. Denies substance use.  Previous medication trials:  Prozac, Lexapro, Zoloft, Wellbutrin, Pristiq, Fetzima x 3 years, and  Aplenzin,     Review of Systems:  Review of Systems  Musculoskeletal:  Negative for gait problem.  Neurological:  Negative for tremors.  Psychiatric/Behavioral:         Please refer to HPI    Medications: I have reviewed the patient's current medications.  Current Outpatient Medications  Medication Sig Dispense Refill   cariprazine (VRAYLAR) 1.5 MG capsule Take 1 capsule (1.5 mg total) by mouth daily. 28 capsule 0   ALPRAZolam (XANAX) 0.5 MG tablet Take 1 tablet at  bedtime the night before procedure. Take 1 tablet 1 hour before arrival. Bring third tablet with you to procedure. 3 tablet 0   atorvastatin (LIPITOR) 20 MG tablet Take 1 tablet (20 mg total) by mouth daily. 30 tablet 5   atovaquone-proguanil (MALARONE) 250-100 MG TABS tablet Take 1 tablet by mouth once daily begin 2 days prearrival,take daily during stay & continue for 7 days posttravel for malaria prevention 24 tablet 0   azithromycin (ZITHROMAX) 500 MG tablet For non bloody diarrhea,take 2 tabs on day 1, if resolved,stop med,If diarrhea persists 1 tab on day 2 & 3. For bloody diarrhea,2 tabs on day 1 & 1 tab on day 2 & 3 4 tablet 0   buPROPion (WELLBUTRIN XL) 150 MG 24 hr tablet   3   buPROPion (WELLBUTRIN XL) 150 MG 24 hr tablet Take 1 tablet (150 mg total) by mouth every morning. 30 tablet 3   celecoxib (CELEBREX) 200 MG capsule Take 1 capsule by mouth single dose as directed. Take 1 hour prior to arrival for surgery 1 capsule 0   esomeprazole (NEXIUM) 20 MG capsule Take 20 mg by mouth once.     FETZIMA 120 MG CP24   3   fluocinonide ointment (LIDEX) 0.05 % Apply as directed to skin twice a day 30 g 0   gabapentin (NEURONTIN) 300 MG capsule Take 1 capsule (300 mg total) by mouth 3 (three) times daily. Take first dose 1 hour before arrival for surgery. 21 capsule 0   ketoconazole (NIZORAL) 2 % cream Apply a small amount to affected area  on left foot/toe once a day 30 g 0   Levomilnacipran HCl ER (FETZIMA) 120 MG CP24 Take 1 capsule by mouth daily. 90 capsule 3   metFORMIN (GLUCOPHAGE) 500 MG tablet Take 1 tablet (500 mg total) by mouth daily. 90 tablet 1   metFORMIN (GLUCOPHAGE) 500 MG tablet Take 1 tablet (500 mg total) by mouth 2 (two) times daily. 180 tablet 1   metFORMIN (GLUCOPHAGE-XR) 500 MG 24 hr tablet Take 1 tablet (500 mg total) by mouth daily. Please do not fill if not picked up within 30 days. 30 tablet 0   methocarbamol (ROBAXIN) 500 MG tablet Take 1 tablet (500 mg total) by mouth  every 6 (six) hours as needed for muscle spasms. (Patient not taking: Reported on 05/22/2018) 45 tablet 0   ondansetron (ZOFRAN) 4 MG tablet Take 1 tablet (4 mg total) by mouth every 6 (six) hours for nausea. TAKE 4 tablets 15 minutes prior to arrival for surgery 30 tablet 0   ondansetron (ZOFRAN) 4 MG tablet Take 1 tablet  by mouth every 6 hours as needed for nausea 15 tablet 0   oxyCODONE (OXY IR/ROXICODONE) 5 MG immediate release tablet Take 1-2 tablet by mouth every six to eight hours as needed for Post Op Pain. DO NOT EXCEED  6 tablets in 24 hours. 30 tablet 0   polyethylene glycol (MIRALAX / GLYCOLAX) packet Take 17 g by mouth daily.     polyethylene glycol-electrolytes (NULYTELY) 420 g solution Take 4,000 mLs by mouth Nightly. 4000 mL 0   Semaglutide-Weight Management (WEGOVY) 2.4 MG/0.75ML SOAJ Inject 2.4 mg into the skin once a week for weight loss. 3 mL 0   sulfamethoxazole-trimethoprim (BACTRIM DS) 800-160 MG tablet Take 1 tablet by mouth 2 (two) times daily as directed. Start the day after surgery. 10 tablet 0   SUMAtriptan (IMITREX) 50 MG tablet TAKE 1 TABLET BY MOUTH AT ONSET OF HEADACHE, MAY REPEAT 1 DOSE AFTER 2 HOURS IF NEEDED 10 tablet 5   SUMAtriptan (IMITREX) 50 MG tablet Take 1 tablet by mouth at onset of headache, may repeat same dose once 2 hours later if needed 10 tablet 5   SUMAtriptan (IMITREX) 50 MG tablet Take 1 tablet (50 mg total) by mouth at the onset of headache. May repeat 1 dose after 2 hours if needed. 10 tablet 5   SUMAtriptan (IMITREX) 50 MG tablet Take 1 tablet (50 mg total) by mouth at onset of headache,may repeat 1 dose after 2 hours if needed, 2 to 3 times a week 9 tablet 6   terbinafine (LAMISIL) 250 MG tablet Take 1 tablet (250 mg total) by mouth daily for Tinea 14 tablet 0   traZODone (DESYREL) 50 MG tablet Take 50 mg by mouth at bedtime.     traZODone (DESYREL) 50 MG tablet TAKE 1 TO 2 TABLETS BY MOUTH AT BEDTIME 180 tablet 1   traZODone (DESYREL) 50 MG  tablet Take 1-2 tablets (50-100 mg total) by mouth at bedtime. 180 tablet 1   traZODone (DESYREL) 50 MG tablet Take 1-2 tablets (50-100 mg total) by mouth at bedtime. 180 tablet 2   WEGOVY 0.5 MG/0.5ML SOAJ Inject 0.5 mg into the skin once a week for weight loss. 2 mL 0   WEGOVY 1 MG/0.5ML SOAJ Inject 1 mg into the skin once a week for weight loss 2 mL 0   WEGOVY 2.4 MG/0.75ML SOAJ Inject 2.4 mg into the skin once a week for weight loss. 3 mL 0   WEGOVY  2.4 MG/0.75ML SOAJ Inject 2.4 mg into the skin once a week fro weight loss 3 mL 0   WEGOVY 2.4 MG/0.75ML SOAJ Inject 2.4 mg into the skin once a week for weight loss 3 mL 0   WEGOVY 2.4 MG/0.75ML SOAJ Inject 2.4 mg,sub-q, into the skin once a week for weight loss 3 mL 0   WEGOVY 2.4 MG/0.75ML SOAJ Inject 2.4 mg into the skin once a week for weight loss. 3 mL 0   WEGOVY 2.4 MG/0.75ML SOAJ Inject 2.4 mg into the skin once a week for weight loss. 3 mL 0   WEGOVY 2.4 MG/0.75ML SOAJ Inject 2.4 mg into the skin once a week for weight loss 3 mL 0   No current facility-administered medications for this visit.    Medication Side Effects: None  Allergies:  Allergies  Allergen Reactions   Brewers Yeast Rash    Past Medical History:  Diagnosis Date   Depression    takes Welbutrin and Fetzima   GERD (gastroesophageal reflux disease)    takes nexium    Past Medical History, Surgical history, Social history, and Family history were reviewed and updated as appropriate.   Please see review of systems for further details on the patient's review from today.   Objective:   Physical Exam:  There were no vitals taken for this visit.  Physical Exam Constitutional:      General: She is not in acute distress. Musculoskeletal:        General: No deformity.  Neurological:     Mental Status: She is alert and oriented to person, place, and time.     Coordination: Coordination normal.  Psychiatric:        Attention and Perception: Attention and  perception normal. She does not perceive auditory or visual hallucinations.        Mood and Affect: Mood normal. Mood is not anxious or depressed. Affect is not labile, blunt, angry or inappropriate.        Speech: Speech normal.        Behavior: Behavior normal.        Thought Content: Thought content normal. Thought content is not paranoid or delusional. Thought content does not include homicidal or suicidal ideation. Thought content does not include homicidal or suicidal plan.        Cognition and Memory: Cognition and memory normal.        Judgment: Judgment normal.     Comments: Insight intact     Lab Review:     Component Value Date/Time   NA 140 09/01/2016 0852   K 4.4 09/01/2016 0852   CL 104 09/01/2016 0852   CO2 31 09/01/2016 0852   GLUCOSE 90 09/01/2016 0852   BUN 14 09/01/2016 0852   CREATININE 0.86 09/01/2016 0852   CALCIUM 9.2 09/01/2016 0852   PROT 7.0 09/01/2016 0852   ALBUMIN 4.3 09/01/2016 0852   AST 33 09/01/2016 0852   ALT 70 (H) 09/01/2016 0852   ALKPHOS 66 09/01/2016 0852   BILITOT 0.7 09/01/2016 0852       Component Value Date/Time   WBC 5.1 09/01/2016 0852   RBC 4.30 09/01/2016 0852   HGB 13.5 09/01/2016 0852   HCT 38.9 09/01/2016 0852   PLT 286.0 09/01/2016 0852   MCV 90.4 09/01/2016 0852   MCHC 34.6 09/01/2016 0852   RDW 11.9 09/01/2016 0852   LYMPHSABS 1.5 09/01/2016 0852   MONOABS 0.4 09/01/2016 0852   EOSABS 0.1 09/01/2016 0852   BASOSABS 0.0  09/01/2016 0852    No results found for: "POCLITH", "LITHIUM"   No results found for: "PHENYTOIN", "PHENOBARB", "VALPROATE", "CBMZ"   .res Assessment: Plan:    Plan:  Discussed Gene Sight testing findings - copy given to patient.   Wellbutrin XL 150mg  every morning Fetzima 120mg  daily  Add Vraylar 1.5mg  daily for depression - samples given.   RTC 4 weeks  Patient advised to contact office with any questions, adverse effects, or acute worsening in signs and symptoms.   Diagnoses and  all orders for this visit:  Major depressive disorder, recurrent episode, moderate (HCC) -     cariprazine (VRAYLAR) 1.5 MG capsule; Take 1 capsule (1.5 mg total) by mouth daily.  Generalized anxiety disorder     Please see After Visit Summary for patient specific instructions.  Future Appointments  Date Time Provider Department Center  03/05/2023  1:20 PM Allanah Mcfarland, Thereasa Solo, NP [facility] None    No orders of the defined types were placed in this encounter.   -------------------------------

## 2023-03-05 ENCOUNTER — Encounter: Payer: Self-pay | Admitting: Adult Health

## 2023-03-05 ENCOUNTER — Ambulatory Visit (INDEPENDENT_AMBULATORY_CARE_PROVIDER_SITE_OTHER): Payer: Commercial Managed Care - PPO | Admitting: Adult Health

## 2023-03-05 ENCOUNTER — Other Ambulatory Visit: Payer: Self-pay | Admitting: Adult Health

## 2023-03-05 ENCOUNTER — Other Ambulatory Visit: Payer: Self-pay

## 2023-03-05 ENCOUNTER — Other Ambulatory Visit (HOSPITAL_COMMUNITY): Payer: Self-pay

## 2023-03-05 DIAGNOSIS — F411 Generalized anxiety disorder: Secondary | ICD-10-CM

## 2023-03-05 DIAGNOSIS — F331 Major depressive disorder, recurrent, moderate: Secondary | ICD-10-CM | POA: Diagnosis not present

## 2023-03-05 MED ORDER — BUPROPION HCL ER (XL) 150 MG PO TB24
150.0000 mg | ORAL_TABLET | ORAL | 0 refills | Status: DC
Start: 2023-03-05 — End: 2023-08-29
  Filled 2023-05-30: qty 90, 90d supply, fill #0

## 2023-03-05 MED ORDER — BUPROPION HCL ER (XL) 150 MG PO TB24
150.0000 mg | ORAL_TABLET | ORAL | 0 refills | Status: DC
Start: 2023-03-05 — End: 2023-03-05
  Filled 2023-03-05: qty 90, 90d supply, fill #0

## 2023-03-05 MED ORDER — BUPROPION HCL ER (XL) 150 MG PO TB24
150.0000 mg | ORAL_TABLET | ORAL | 0 refills | Status: DC
Start: 2023-03-05 — End: 2023-03-05

## 2023-03-05 NOTE — Progress Notes (Signed)
[name] [id number] 1975-11-16 47 y.o.  Subjective:   Patient ID:  Robin Stevenson is a 48 y.o. (DOB 1976-04-18) female.  Chief Complaint: No chief complaint on file.   HPI Robin Stevenson presents to the office today for follow-up of MDD and GAD.  Previously seen at [facility] for the past 15 years - Robin Stevenson.    Describes mood today as "ok". Pleasant. Denies tearfulness. Mood symptoms - reports depression, anxiety and irritability. Denies panic attacks. Reports worry, rumination, and over thinking. Denies obsessive thoughts and acts. Denies irrational thoughts and fears. Mood is lower. Stating "I'm not feeling any better". Willing to consider other options with recent gene sight testing. Varying interest and motivation. Taking other medications as prescribed.   Energy levels stable. Active, walking 2 to 3 miles a day. Enjoys some usual interests and activities. Married. Lives with husband of 23 years - daughter. Spending time with family. Appetite adequate. Weight stable - 30 pounds with wegovy. Sleeps well most nights. Averages 6 to 8 hours. Focus and concentration stable. Completing tasks. Managing aspects of household.  Denies SI or HI.  Denies AH or VH. Denies self harm. Denies substance use.  Previous medication trials:  Prozac, Lexapro, Zoloft, Wellbutrin, Pristiq, Fetzima x 3 years, and  Aplenzin,    Review of Systems:  Review of Systems  Musculoskeletal:  Negative for gait problem.  Neurological:  Negative for tremors.  Psychiatric/Behavioral:         Please refer to HPI    Medications: I have reviewed the patient's current medications.  Current Outpatient Medications  Medication Sig Dispense Refill   ALPRAZolam (XANAX) 0.5 MG tablet Take 1 tablet at bedtime the night before procedure. Take 1 tablet 1 hour before arrival. Bring third tablet with you to procedure. 3 tablet 0   atorvastatin (LIPITOR) 20 MG tablet Take 1 tablet (20  mg total) by mouth daily. 30 tablet 5   atovaquone-proguanil (MALARONE) 250-100 MG TABS tablet Take 1 tablet by mouth once daily begin 2 days prearrival,take daily during stay & continue for 7 days posttravel for malaria prevention 24 tablet 0   azithromycin (ZITHROMAX) 500 MG tablet For non bloody diarrhea,take 2 tabs on day 1, if resolved,stop med,If diarrhea persists 1 tab on day 2 & 3. For bloody diarrhea,2 tabs on day 1 & 1 tab on day 2 & 3 4 tablet 0   buPROPion (WELLBUTRIN XL) 150 MG 24 hr tablet   3   buPROPion (WELLBUTRIN XL) 150 MG 24 hr tablet Take 1 tablet (150 mg total) by mouth every morning. 90 tablet 0   cariprazine (VRAYLAR) 1.5 MG capsule Take 1 capsule (1.5 mg total) by mouth daily. 28 capsule 0   celecoxib (CELEBREX) 200 MG capsule Take 1 capsule by mouth single dose as directed. Take 1 hour prior to arrival for surgery 1 capsule 0   esomeprazole (NEXIUM) 20 MG capsule Take 20 mg by mouth once.     FETZIMA 120 MG CP24   3   fluocinonide ointment (LIDEX) 0.05 % Apply as directed to skin twice a day 30 g 0   gabapentin (NEURONTIN) 300 MG capsule Take 1 capsule (300 mg total) by mouth 3 (three) times daily. Take first dose 1 hour before arrival for surgery. 21 capsule 0   ketoconazole (NIZORAL) 2 % cream Apply a small amount to affected area on left foot/toe once a day 30 g 0   Levomilnacipran HCl ER (FETZIMA) 120 MG CP24 Take  1 capsule by mouth daily. 90 capsule 3   metFORMIN (GLUCOPHAGE) 500 MG tablet Take 1 tablet (500 mg total) by mouth daily. 90 tablet 1   metFORMIN (GLUCOPHAGE) 500 MG tablet Take 1 tablet (500 mg total) by mouth 2 (two) times daily. 180 tablet 1   metFORMIN (GLUCOPHAGE-XR) 500 MG 24 hr tablet Take 1 tablet (500 mg total) by mouth daily. Please do not fill if not picked up within 30 days. 30 tablet 0   methocarbamol (ROBAXIN) 500 MG tablet Take 1 tablet (500 mg total) by mouth every 6 (six) hours as needed for muscle spasms. (Patient not taking: Reported on  05/22/2018) 45 tablet 0   ondansetron (ZOFRAN) 4 MG tablet Take 1 tablet (4 mg total) by mouth every 6 (six) hours for nausea. TAKE 4 tablets 15 minutes prior to arrival for surgery 30 tablet 0   ondansetron (ZOFRAN) 4 MG tablet Take 1 tablet  by mouth every 6 hours as needed for nausea 15 tablet 0   oxyCODONE (OXY IR/ROXICODONE) 5 MG immediate release tablet Take 1-2 tablet by mouth every six to eight hours as needed for Post Op Pain. DO NOT EXCEED  6 tablets in 24 hours. 30 tablet 0   polyethylene glycol (MIRALAX / GLYCOLAX) packet Take 17 g by mouth daily.     polyethylene glycol-electrolytes (NULYTELY) 420 g solution Take 4,000 mLs by mouth Nightly. 4000 mL 0   Semaglutide-Weight Management (WEGOVY) 2.4 MG/0.75ML SOAJ Inject 2.4 mg into the skin once a week for weight loss. 3 mL 0   sulfamethoxazole-trimethoprim (BACTRIM DS) 800-160 MG tablet Take 1 tablet by mouth 2 (two) times daily as directed. Start the day after surgery. 10 tablet 0   SUMAtriptan (IMITREX) 50 MG tablet TAKE 1 TABLET BY MOUTH AT ONSET OF HEADACHE, MAY REPEAT 1 DOSE AFTER 2 HOURS IF NEEDED 10 tablet 5   SUMAtriptan (IMITREX) 50 MG tablet Take 1 tablet by mouth at onset of headache, may repeat same dose once 2 hours later if needed 10 tablet 5   SUMAtriptan (IMITREX) 50 MG tablet Take 1 tablet (50 mg total) by mouth at the onset of headache. May repeat 1 dose after 2 hours if needed. 10 tablet 5   SUMAtriptan (IMITREX) 50 MG tablet Take 1 tablet (50 mg total) by mouth at onset of headache,may repeat 1 dose after 2 hours if needed, 2 to 3 times a week 9 tablet 6   terbinafine (LAMISIL) 250 MG tablet Take 1 tablet (250 mg total) by mouth daily for Tinea 14 tablet 0   traZODone (DESYREL) 50 MG tablet Take 50 mg by mouth at bedtime.     traZODone (DESYREL) 50 MG tablet TAKE 1 TO 2 TABLETS BY MOUTH AT BEDTIME 180 tablet 1   traZODone (DESYREL) 50 MG tablet Take 1-2 tablets (50-100 mg total) by mouth at bedtime. 180 tablet 1    traZODone (DESYREL) 50 MG tablet Take 1-2 tablets (50-100 mg total) by mouth at bedtime. 180 tablet 2   WEGOVY 0.5 MG/0.5ML SOAJ Inject 0.5 mg into the skin once a week for weight loss. 2 mL 0   WEGOVY 1 MG/0.5ML SOAJ Inject 1 mg into the skin once a week for weight loss 2 mL 0   WEGOVY 2.4 MG/0.75ML SOAJ Inject 2.4 mg into the skin once a week for weight loss. 3 mL 0   WEGOVY 2.4 MG/0.75ML SOAJ Inject 2.4 mg into the skin once a week fro weight loss 3 mL 0  WEGOVY 2.4 MG/0.75ML SOAJ Inject 2.4 mg into the skin once a week for weight loss 3 mL 0   WEGOVY 2.4 MG/0.75ML SOAJ Inject 2.4 mg,sub-q, into the skin once a week for weight loss 3 mL 0   WEGOVY 2.4 MG/0.75ML SOAJ Inject 2.4 mg into the skin once a week for weight loss. 3 mL 0   WEGOVY 2.4 MG/0.75ML SOAJ Inject 2.4 mg into the skin once a week for weight loss. 3 mL 0   WEGOVY 2.4 MG/0.75ML SOAJ Inject 2.4 mg into the skin once a week for weight loss 3 mL 0   No current facility-administered medications for this visit.    Medication Side Effects: None  Allergies:  Allergies  Allergen Reactions   Brewers Yeast Rash    Past Medical History:  Diagnosis Date   Depression    takes Welbutrin and Fetzima   GERD (gastroesophageal reflux disease)    takes nexium    Past Medical History, Surgical history, Social history, and Family history were reviewed and updated as appropriate.   Please see review of systems for further details on the patient's review from today.   Objective:   Physical Exam:  There were no vitals taken for this visit.  Physical Exam Constitutional:      General: She is not in acute distress. Musculoskeletal:        General: No deformity.  Neurological:     Mental Status: She is alert and oriented to person, place, and time.     Coordination: Coordination normal.  Psychiatric:        Attention and Perception: Attention and perception normal. She does not perceive auditory or visual hallucinations.         Mood and Affect: Affect is not labile, blunt, angry or inappropriate.        Speech: Speech normal.        Behavior: Behavior normal.        Thought Content: Thought content normal. Thought content is not paranoid or delusional. Thought content does not include homicidal or suicidal ideation. Thought content does not include homicidal or suicidal plan.        Cognition and Memory: Cognition and memory normal.        Judgment: Judgment normal.     Comments: Insight intact     Lab Review:     Component Value Date/Time   NA 140 09/01/2016 0852   K 4.4 09/01/2016 0852   CL 104 09/01/2016 0852   CO2 31 09/01/2016 0852   GLUCOSE 90 09/01/2016 0852   BUN 14 09/01/2016 0852   CREATININE 0.86 09/01/2016 0852   CALCIUM 9.2 09/01/2016 0852   PROT 7.0 09/01/2016 0852   ALBUMIN 4.3 09/01/2016 0852   AST 33 09/01/2016 0852   ALT 70 (H) 09/01/2016 0852   ALKPHOS 66 09/01/2016 0852   BILITOT 0.7 09/01/2016 0852       Component Value Date/Time   WBC 5.1 09/01/2016 0852   RBC 4.30 09/01/2016 0852   HGB 13.5 09/01/2016 0852   HCT 38.9 09/01/2016 0852   PLT 286.0 09/01/2016 0852   MCV 90.4 09/01/2016 0852   MCHC 34.6 09/01/2016 0852   RDW 11.9 09/01/2016 0852   LYMPHSABS 1.5 09/01/2016 0852   MONOABS 0.4 09/01/2016 0852   EOSABS 0.1 09/01/2016 0852   BASOSABS 0.0 09/01/2016 0852    No results found for: "POCLITH", "LITHIUM"   No results found for: "PHENYTOIN", "PHENOBARB", "VALPROATE", "CBMZ"   .res Assessment: Plan:  Plan:  Discussed Gene Sight testing findings - copy given to patient.   Wellbutrin XL 150mg  every morning Fetzima 120mg  daily  D/C Vraylar 1.5mg  daily for depression - samples given.   Add Rexulti 0.5mg  daily - samples given.  RTC 4 weeks  Patient advised to contact office with any questions, adverse effects, or acute worsening in signs and symptoms.  Discussed potential metabolic side effects associated with atypical antipsychotics, as well as  potential risk for movement side effects. Advised pt to contact office if movement side effects occur.    Diagnoses and all orders for this visit:  Major depressive disorder, recurrent episode, moderate (HCC) -     buPROPion (WELLBUTRIN XL) 150 MG 24 hr tablet; Take 1 tablet (150 mg total) by mouth every morning.     Please see After Visit Summary for patient specific instructions.  Future Appointments  Date Time Provider Department Center  04/02/2023 10:40 AM Deliliah Spranger, Thereasa Solo, NP [facility] None    No orders of the defined types were placed in this encounter.   -------------------------------

## 2023-03-20 ENCOUNTER — Other Ambulatory Visit (HOSPITAL_COMMUNITY): Payer: Self-pay

## 2023-03-20 DIAGNOSIS — Z01419 Encounter for gynecological examination (general) (routine) without abnormal findings: Secondary | ICD-10-CM | POA: Diagnosis not present

## 2023-03-20 DIAGNOSIS — Z6825 Body mass index (BMI) 25.0-25.9, adult: Secondary | ICD-10-CM | POA: Diagnosis not present

## 2023-03-20 DIAGNOSIS — Z1231 Encounter for screening mammogram for malignant neoplasm of breast: Secondary | ICD-10-CM | POA: Diagnosis not present

## 2023-03-26 ENCOUNTER — Other Ambulatory Visit: Payer: Self-pay | Admitting: Obstetrics and Gynecology

## 2023-03-26 DIAGNOSIS — R928 Other abnormal and inconclusive findings on diagnostic imaging of breast: Secondary | ICD-10-CM

## 2023-04-02 ENCOUNTER — Ambulatory Visit (INDEPENDENT_AMBULATORY_CARE_PROVIDER_SITE_OTHER): Payer: Commercial Managed Care - PPO | Admitting: Adult Health

## 2023-04-02 ENCOUNTER — Encounter: Payer: Self-pay | Admitting: Adult Health

## 2023-04-02 DIAGNOSIS — F339 Major depressive disorder, recurrent, unspecified: Secondary | ICD-10-CM | POA: Diagnosis not present

## 2023-04-02 NOTE — Progress Notes (Signed)
Robin Stevenson [id number] [birth year] 47 y.o.  Subjective:   Patient ID:  Robin Stevenson is a 48 y.o. (DOB [birth year]) female.  Chief Complaint: No chief complaint on file.   HPI Robin Stevenson presents to the office today for follow-up of MDD and GAD.  Previously seen at [facility] for the past 15 years - Robin Stevenson.    Describes mood today as "ok". Pleasant. Denies tearfulness. Mood symptoms - reports depression, anxiety and irritability. Denies panic attacks. Reports worry, rumination, and over thinking. Denies obsessive thoughts and acts. Denies irrational thoughts and fears. Mood remains lower. Stating "I'm about the same". Does not feel like the addition of Rexulti was helpful for mood symptoms. Willing to consider other options. Varying interest and motivation. Taking other medications as prescribed.   Energy levels stable. Active, walking 2 to 3 miles a day. Enjoys some usual interests and activities. Married. Lives with husband of 23 years - daughter. Daughter attending [employer] this fall. Spending time with family. Appetite adequate. Weight gain. Sleeps well most nights. Averages 6 hours. Focus and concentration stable. Completing tasks. Managing aspects of household.  Denies SI or HI.  Denies AH or VH. Denies self harm. Denies substance use.  Previous medication trials:  Prozac, Lexapro, Zoloft, Wellbutrin, Pristiq, Fetzima x 3 years, and  Aplenzin,    Review of Systems:  Review of Systems  Musculoskeletal:  Negative for gait problem.  Neurological:  Negative for tremors.  Psychiatric/Behavioral:         Please refer to HPI    Medications: I have reviewed the patient's current medications.  Current Outpatient Medications  Medication Sig Dispense Refill   ALPRAZolam (XANAX) 0.5 MG tablet Take 1 tablet at bedtime the night before procedure. Take 1 tablet 1 hour before arrival. Bring third tablet with you to procedure. 3 tablet 0   atorvastatin  (LIPITOR) 20 MG tablet Take 1 tablet (20 mg total) by mouth daily. 30 tablet 5   atovaquone-proguanil (MALARONE) 250-100 MG TABS tablet Take 1 tablet by mouth once daily begin 2 days prearrival,take daily during stay & continue for 7 days posttravel for malaria prevention 24 tablet 0   azithromycin (ZITHROMAX) 500 MG tablet For non bloody diarrhea,take 2 tabs on day 1, if resolved,stop med,If diarrhea persists 1 tab on day 2 & 3. For bloody diarrhea,2 tabs on day 1 & 1 tab on day 2 & 3 4 tablet 0   buPROPion (WELLBUTRIN XL) 150 MG 24 hr tablet   3   buPROPion (WELLBUTRIN XL) 150 MG 24 hr tablet Take 1 tablet (150 mg total) by mouth every morning. 90 tablet 0   cariprazine (VRAYLAR) 1.5 MG capsule Take 1 capsule (1.5 mg total) by mouth daily. 28 capsule 0   celecoxib (CELEBREX) 200 MG capsule Take 1 capsule by mouth single dose as directed. Take 1 hour prior to arrival for surgery 1 capsule 0   esomeprazole (NEXIUM) 20 MG capsule Take 20 mg by mouth once.     FETZIMA 120 MG CP24   3   fluocinonide ointment (LIDEX) 0.05 % Apply as directed to skin twice a day 30 g 0   gabapentin (NEURONTIN) 300 MG capsule Take 1 capsule (300 mg total) by mouth 3 (three) times daily. Take first dose 1 hour before arrival for surgery. 21 capsule 0   ketoconazole (NIZORAL) 2 % cream Apply a small amount to affected area on left foot/toe once a day 30 g 0   Levomilnacipran HCl ER (  FETZIMA) 120 MG CP24 Take 1 capsule by mouth daily. 90 capsule 3   metFORMIN (GLUCOPHAGE) 500 MG tablet Take 1 tablet (500 mg total) by mouth daily. 90 tablet 1   metFORMIN (GLUCOPHAGE) 500 MG tablet Take 1 tablet (500 mg total) by mouth 2 (two) times daily. 180 tablet 1   metFORMIN (GLUCOPHAGE-XR) 500 MG 24 hr tablet Take 1 tablet (500 mg total) by mouth daily. Please do not fill if not picked up within 30 days. 30 tablet 0   methocarbamol (ROBAXIN) 500 MG tablet Take 1 tablet (500 mg total) by mouth every 6 (six) hours as needed for muscle  spasms. (Patient not taking: Reported on 05/22/2018) 45 tablet 0   ondansetron (ZOFRAN) 4 MG tablet Take 1 tablet (4 mg total) by mouth every 6 (six) hours for nausea. TAKE 4 tablets 15 minutes prior to arrival for surgery 30 tablet 0   ondansetron (ZOFRAN) 4 MG tablet Take 1 tablet  by mouth every 6 hours as needed for nausea 15 tablet 0   oxyCODONE (OXY IR/ROXICODONE) 5 MG immediate release tablet Take 1-2 tablet by mouth every six to eight hours as needed for Post Op Pain. DO NOT EXCEED  6 tablets in 24 hours. 30 tablet 0   polyethylene glycol (MIRALAX / GLYCOLAX) packet Take 17 g by mouth daily.     polyethylene glycol-electrolytes (NULYTELY) 420 g solution Take 4,000 mLs by mouth Nightly. 4000 mL 0   Semaglutide-Weight Management (WEGOVY) 2.4 MG/0.75ML SOAJ Inject 2.4 mg into the skin once a week for weight loss. 3 mL 0   sulfamethoxazole-trimethoprim (BACTRIM DS) 800-160 MG tablet Take 1 tablet by mouth 2 (two) times daily as directed. Start the day after surgery. 10 tablet 0   SUMAtriptan (IMITREX) 50 MG tablet TAKE 1 TABLET BY MOUTH AT ONSET OF HEADACHE, MAY REPEAT 1 DOSE AFTER 2 HOURS IF NEEDED 10 tablet 5   SUMAtriptan (IMITREX) 50 MG tablet Take 1 tablet by mouth at onset of headache, may repeat same dose once 2 hours later if needed 10 tablet 5   SUMAtriptan (IMITREX) 50 MG tablet Take 1 tablet (50 mg total) by mouth at the onset of headache. May repeat 1 dose after 2 hours if needed. 10 tablet 5   SUMAtriptan (IMITREX) 50 MG tablet Take 1 tablet (50 mg total) by mouth at onset of headache,may repeat 1 dose after 2 hours if needed, 2 to 3 times a week 9 tablet 6   terbinafine (LAMISIL) 250 MG tablet Take 1 tablet (250 mg total) by mouth daily for Tinea 14 tablet 0   traZODone (DESYREL) 50 MG tablet Take 50 mg by mouth at bedtime.     traZODone (DESYREL) 50 MG tablet TAKE 1 TO 2 TABLETS BY MOUTH AT BEDTIME 180 tablet 1   traZODone (DESYREL) 50 MG tablet Take 1-2 tablets (50-100 mg total) by  mouth at bedtime. 180 tablet 1   traZODone (DESYREL) 50 MG tablet Take 1-2 tablets (50-100 mg total) by mouth at bedtime. 180 tablet 2   WEGOVY 0.5 MG/0.5ML SOAJ Inject 0.5 mg into the skin once a week for weight loss. 2 mL 0   WEGOVY 1 MG/0.5ML SOAJ Inject 1 mg into the skin once a week for weight loss 2 mL 0   WEGOVY 2.4 MG/0.75ML SOAJ Inject 2.4 mg into the skin once a week for weight loss. 3 mL 0   WEGOVY 2.4 MG/0.75ML SOAJ Inject 2.4 mg into the skin once a week fro weight  loss 3 mL 0   WEGOVY 2.4 MG/0.75ML SOAJ Inject 2.4 mg into the skin once a week for weight loss 3 mL 0   WEGOVY 2.4 MG/0.75ML SOAJ Inject 2.4 mg,sub-q, into the skin once a week for weight loss 3 mL 0   WEGOVY 2.4 MG/0.75ML SOAJ Inject 2.4 mg into the skin once a week for weight loss. 3 mL 0   WEGOVY 2.4 MG/0.75ML SOAJ Inject 2.4 mg into the skin once a week for weight loss. 3 mL 0   WEGOVY 2.4 MG/0.75ML SOAJ Inject 2.4 mg into the skin once a week for weight loss 3 mL 0   No current facility-administered medications for this visit.    Medication Side Effects: None  Allergies:  Allergies  Allergen Reactions   Brewers Yeast Rash    Past Medical History:  Diagnosis Date   Depression    takes Welbutrin and Fetzima   GERD (gastroesophageal reflux disease)    takes nexium    Past Medical History, Surgical history, Social history, and Family history were reviewed and updated as appropriate.   Please see review of systems for further details on the patient's review from today.   Objective:   Physical Exam:  There were no vitals taken for this visit.  Physical Exam Constitutional:      General: She is not in acute distress. Musculoskeletal:        General: No deformity.  Neurological:     Mental Status: She is alert and oriented to person, place, and time.     Coordination: Coordination normal.  Psychiatric:        Attention and Perception: Attention and perception normal. She does not perceive  auditory or visual hallucinations.        Mood and Affect: Mood is depressed. Affect is not labile, blunt, angry or inappropriate.        Speech: Speech normal.        Behavior: Behavior normal.        Thought Content: Thought content normal. Thought content is not paranoid or delusional. Thought content does not include homicidal or suicidal ideation. Thought content does not include homicidal or suicidal plan.        Cognition and Memory: Cognition and memory normal.        Judgment: Judgment normal.     Comments: Insight intact     Lab Review:     Component Value Date/Time   NA 140 09/01/2016 0852   K 4.4 09/01/2016 0852   CL 104 09/01/2016 0852   CO2 31 09/01/2016 0852   GLUCOSE 90 09/01/2016 0852   BUN 14 09/01/2016 0852   CREATININE 0.86 09/01/2016 0852   CALCIUM 9.2 09/01/2016 0852   PROT 7.0 09/01/2016 0852   ALBUMIN 4.3 09/01/2016 0852   AST 33 09/01/2016 0852   ALT 70 (H) 09/01/2016 0852   ALKPHOS 66 09/01/2016 0852   BILITOT 0.7 09/01/2016 0852       Component Value Date/Time   WBC 5.1 09/01/2016 0852   RBC 4.30 09/01/2016 0852   HGB 13.5 09/01/2016 0852   HCT 38.9 09/01/2016 0852   PLT 286.0 09/01/2016 0852   MCV 90.4 09/01/2016 0852   MCHC 34.6 09/01/2016 0852   RDW 11.9 09/01/2016 0852   LYMPHSABS 1.5 09/01/2016 0852   MONOABS 0.4 09/01/2016 0852   EOSABS 0.1 09/01/2016 0852   BASOSABS 0.0 09/01/2016 0852    No results found for: "POCLITH", "LITHIUM"   No results found for: "PHENYTOIN", "PHENOBARB", "  VALPROATE", "CBMZ"   .res Assessment: Plan:    Plan:  Discussed Gene Sight testing findings - copy given to patient.   Wellbutrin XL 150mg  every morning Fetzima 120mg  daily  D/C Rexulti 0.5mg  daily   Consider Spravato - information given - [name] Jones to call with follow up.  RTC 4 weeks  Patient advised to contact office with any questions, adverse effects, or acute worsening in signs and symptoms.  Discussed potential metabolic side  effects associated with atypical antipsychotics, as well as potential risk for movement side effects. Advised pt to contact office if movement side effects occur.    There are no diagnoses linked to this encounter.   Please see After Visit Summary for patient specific instructions.  Future Appointments  Date Time Provider Department Center  04/02/2023 10:40 AM Leo Fray, Thereasa Solo, NP CP-[facility] None  04/04/2023  1:30 PM [facility]    No orders of the defined types were placed in this encounter.   -------------------------------

## 2023-04-04 ENCOUNTER — Other Ambulatory Visit: Payer: Self-pay | Admitting: Obstetrics and Gynecology

## 2023-04-04 ENCOUNTER — Ambulatory Visit
Admission: RE | Admit: 2023-04-04 | Discharge: 2023-04-04 | Disposition: A | Discharge status: Home / Self Care | Payer: Commercial Managed Care - PPO | Source: Ambulatory Visit | Attending: Obstetrics and Gynecology | Admitting: Obstetrics and Gynecology

## 2023-04-04 DIAGNOSIS — R928 Other abnormal and inconclusive findings on diagnostic imaging of breast: Secondary | ICD-10-CM

## 2023-04-04 DIAGNOSIS — R921 Mammographic calcification found on diagnostic imaging of breast: Secondary | ICD-10-CM | POA: Diagnosis not present

## 2023-04-16 DIAGNOSIS — E785 Hyperlipidemia, unspecified: Secondary | ICD-10-CM | POA: Diagnosis not present

## 2023-04-16 DIAGNOSIS — Z23 Encounter for immunization: Secondary | ICD-10-CM | POA: Diagnosis not present

## 2023-04-16 DIAGNOSIS — Z1231 Encounter for screening mammogram for malignant neoplasm of breast: Secondary | ICD-10-CM | POA: Diagnosis not present

## 2023-04-16 DIAGNOSIS — F325 Major depressive disorder, single episode, in full remission: Secondary | ICD-10-CM | POA: Diagnosis not present

## 2023-04-16 DIAGNOSIS — Z0000 Encounter for general adult medical examination without abnormal findings: Secondary | ICD-10-CM | POA: Diagnosis not present

## 2023-04-16 DIAGNOSIS — M259 Joint disorder, unspecified: Secondary | ICD-10-CM | POA: Diagnosis not present

## 2023-04-16 DIAGNOSIS — G43909 Migraine, unspecified, not intractable, without status migrainosus: Secondary | ICD-10-CM | POA: Diagnosis not present

## 2023-04-16 DIAGNOSIS — Z1211 Encounter for screening for malignant neoplasm of colon: Secondary | ICD-10-CM | POA: Diagnosis not present

## 2023-04-17 ENCOUNTER — Ambulatory Visit
Admission: RE | Admit: 2023-04-17 | Discharge: 2023-04-17 | Disposition: A | Discharge status: Home / Self Care | Payer: Commercial Managed Care - PPO | Source: Ambulatory Visit | Attending: Family Medicine | Admitting: Family Medicine

## 2023-04-17 ENCOUNTER — Other Ambulatory Visit: Payer: Self-pay | Admitting: Family Medicine

## 2023-04-17 DIAGNOSIS — M799 Soft tissue disorder, unspecified: Secondary | ICD-10-CM | POA: Diagnosis not present

## 2023-04-17 DIAGNOSIS — M259 Joint disorder, unspecified: Secondary | ICD-10-CM

## 2023-04-26 DIAGNOSIS — E785 Hyperlipidemia, unspecified: Secondary | ICD-10-CM | POA: Diagnosis not present

## 2023-04-26 DIAGNOSIS — Z0000 Encounter for general adult medical examination without abnormal findings: Secondary | ICD-10-CM | POA: Diagnosis not present

## 2023-05-30 ENCOUNTER — Other Ambulatory Visit (HOSPITAL_COMMUNITY): Payer: Self-pay

## 2023-06-11 ENCOUNTER — Telehealth: Payer: Self-pay | Admitting: Adult Health

## 2023-06-11 NOTE — Telephone Encounter (Signed)
Pt called and said that she was getting trazadone from a different doctor. Now she wants  to get it from [facility]. It is 50 mg. [facility]

## 2023-06-11 NOTE — Telephone Encounter (Signed)
From what I see in Epic patient has a RF available. She will contact the pharmacy.

## 2023-06-14 ENCOUNTER — Other Ambulatory Visit: Payer: Self-pay

## 2023-06-14 DIAGNOSIS — F339 Major depressive disorder, recurrent, unspecified: Secondary | ICD-10-CM

## 2023-06-14 MED ORDER — SPRAVATO (56 MG DOSE) 28 MG/DEVICE NA SOPK: 56.0000 mg | PACK | Freq: Once | NASAL | 0 refills | Status: AC

## 2023-06-14 NOTE — Telephone Encounter (Signed)
Pt has completed paperwork for Spravato treatment, will initiate a prior authorization for Spravato 56 mg and 84 mg with [employer]. Order will be sent to [facility] to process. Pending review

## 2023-06-15 ENCOUNTER — Other Ambulatory Visit: Payer: Self-pay

## 2023-06-17 ENCOUNTER — Telehealth: Payer: Self-pay

## 2023-06-17 NOTE — Telephone Encounter (Signed)
Prior Authorization initiated for Spravato 56 mg with [employer], approval received effective 06/16/2023-07/16/2023 (12 sprays per 28 days effective 07/14/2023-09/12/2023)   Will submit a PA for 84 mg to make sure that dose is included in this approval.

## 2023-06-18 ENCOUNTER — Other Ambulatory Visit (HOSPITAL_COMMUNITY): Payer: Self-pay

## 2023-06-18 ENCOUNTER — Other Ambulatory Visit: Payer: Self-pay

## 2023-06-18 MED ORDER — SPRAVATO (56 MG DOSE) 28 MG/DEVICE NA SOPK
PACK | NASAL | 0 refills | Status: DC
Start: 2023-06-18 — End: 2023-06-25
  Filled 2023-06-18: qty 2, fill #0
  Filled 2023-06-19: qty 2, 2d supply, fill #0

## 2023-06-18 NOTE — Progress Notes (Signed)
Enrollment encounter after receipt of new Spravato Rx. I will reach out to provider office once medication is on hand to schedule initial fill and contact patient for copayment card.

## 2023-06-18 NOTE — Telephone Encounter (Signed)
Rx was sent to [facility] but they were waiting for [facility] to release the medication so they are unable to fill Rx for Spravato until released.  [facility] Pharmacist contacted nurse about Spravato Rx and they now can order Spravato, I tried explaining the regimen of treatments and the importance of not missing doses and how the medication would be delivered to the office. She asked for the first Spravato 56 mg Rx be sent to [facility]. Rx sent per Almira Coaster.

## 2023-06-19 ENCOUNTER — Other Ambulatory Visit: Payer: Self-pay

## 2023-06-19 ENCOUNTER — Other Ambulatory Visit (HOSPITAL_COMMUNITY): Payer: Self-pay

## 2023-06-19 MED ORDER — SPRAVATO (84 MG DOSE) 28 MG/DEVICE NA SOPK
PACK | NASAL | 5 refills | Status: DC
Start: 2023-06-19 — End: 2023-07-10
  Filled 2023-06-19: qty 3, fill #0
  Filled 2023-06-19: qty 3, 3d supply, fill #0

## 2023-06-19 NOTE — Progress Notes (Signed)
Pharmacy Patient Advocate Encounter   Received notification from Patient Pharmacy that prior authorization for Spravato is required/requested.   Insurance verification completed.   The patient is insured through [facility] .   Per test claim: PA required; PA submitted to [facility] via CoverMyMeds Key/confirmation #/EOC [id number] Status is pending

## 2023-06-20 ENCOUNTER — Other Ambulatory Visit: Payer: Self-pay

## 2023-06-21 ENCOUNTER — Other Ambulatory Visit: Payer: Self-pay

## 2023-06-21 ENCOUNTER — Other Ambulatory Visit (HOSPITAL_COMMUNITY): Payer: Self-pay

## 2023-06-21 NOTE — Progress Notes (Signed)
Specialty Pharmacy Initial Fill Coordination Note  Robin Stevenson is a 47 y.o. female contacted today regarding refills of specialty medication(s) Esketamine Hcl   Patient requested Courier to Provider Office   Delivery date: 06/21/23   Verified address: [facility] ATTN [name] [address] [place], [place] [address]   Medication will be filled on 06/21/23.   Patient is aware of $20 copayment.    Healthcare setting REMS ID: [id number]  06/21/23 dispense authorization code [facility]

## 2023-06-22 ENCOUNTER — Other Ambulatory Visit (HOSPITAL_COMMUNITY): Payer: Self-pay

## 2023-06-22 ENCOUNTER — Encounter: Payer: Self-pay | Admitting: Adult Health

## 2023-06-22 ENCOUNTER — Other Ambulatory Visit: Payer: Self-pay

## 2023-06-25 ENCOUNTER — Other Ambulatory Visit: Payer: Self-pay

## 2023-06-25 ENCOUNTER — Ambulatory Visit: Payer: Commercial Managed Care - PPO | Admitting: Adult Health

## 2023-06-25 ENCOUNTER — Other Ambulatory Visit (HOSPITAL_COMMUNITY): Payer: Self-pay

## 2023-06-25 ENCOUNTER — Ambulatory Visit: Payer: Commercial Managed Care - PPO

## 2023-06-25 VITALS — BP 134/88 | HR 63

## 2023-06-25 DIAGNOSIS — F339 Major depressive disorder, recurrent, unspecified: Secondary | ICD-10-CM

## 2023-06-25 MED ORDER — SPRAVATO (56 MG DOSE) 28 MG/DEVICE NA SOPK
PACK | NASAL | 0 refills | Status: DC
Start: 2023-06-25 — End: 2023-07-03
  Filled 2023-06-25: qty 2, fill #0
  Filled 2023-06-29: qty 2, 2d supply, fill #0

## 2023-06-25 NOTE — Progress Notes (Signed)
Robin Stevenson [id number] 1976-02-04 47 y.o.  Subjective:   Patient ID:  Robin Stevenson is a 47 y.o. (DOB 1976-03-14) female.  Chief Complaint: No chief complaint on file.   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   Spravato treatment    Patient was administered Spravato 56 mg intranasally today. Patient was observed by provider throughout [facility] treatment. The patient experienced the typical dissociation which gradually resolved over the 2-hour period of observation. There were minor untoward symptoms reported to include nausea and vomiting. She did not feel sleepy, decreased feeling of sensitivity (numbness), lack of energy, increased blood pressure, feeling happy or very excited, or headache. She denied feeling disconnected from themself, their thoughts, feelings and things around them.  Blood pressures remained within normal ranges at the 40-minute and 2-hour follow-up intervals. By the time the 2-hour observation period was met the patient was alert and oriented and able to exit without assistance. Patient willing to continue Spravato administration for the treatment of resistant depression. See nursing note for further details.   Review of Systems:  Review of Systems  Musculoskeletal:  Negative for gait problem.  Neurological:  Negative for tremors.  Psychiatric/Behavioral:         Please refer to HPI    Medications: I have reviewed the patient's current medications.  Current Outpatient Medications  Medication Sig Dispense Refill   ALPRAZolam (XANAX) 0.5 MG tablet Take 1 tablet at bedtime the night before procedure. Take 1 tablet 1 hour before arrival. Bring third tablet with you to procedure. 3 tablet 0   atorvastatin (LIPITOR) 20 MG tablet Take 1 tablet (20 mg total) by mouth daily. 30 tablet 5   atovaquone-proguanil (MALARONE) 250-100 MG TABS tablet Take 1 tablet by mouth once daily begin 2 days  prearrival,take daily during stay & continue for 7 days posttravel for malaria prevention 24 tablet 0   azithromycin (ZITHROMAX) 500 MG tablet For non bloody diarrhea,take 2 tabs on day 1, if resolved,stop med,If diarrhea persists 1 tab on day 2 & 3. For bloody diarrhea,2 tabs on day 1 & 1 tab on day 2 & 3 4 tablet 0   buPROPion (WELLBUTRIN XL) 150 MG 24 hr tablet   3   buPROPion (WELLBUTRIN XL) 150 MG 24 hr tablet Take 1 tablet (150 mg total) by mouth every morning. 90 tablet 0   cariprazine (VRAYLAR) 1.5 MG capsule Take 1 capsule (1.5 mg total) by mouth daily. 28 capsule 0   celecoxib (CELEBREX) 200 MG capsule Take 1 capsule by mouth single dose as directed. Take 1 hour prior to arrival for surgery 1 capsule 0   Esketamine HCl, 56 MG Dose, (SPRAVATO, 56 MG DOSE,) 28 MG/DEVICE SOPK Dispense X 2 (28mg ) devices, administer on day 1 intranasally as directed 2 each 0   Esketamine HCl, 84 MG Dose, (SPRAVATO, 84 MG DOSE,) 28 MG/DEVICE SOPK Dispense X 3 (28 mg) devices for treatments twice per week administer intranasally 3 each 5   esomeprazole (NEXIUM) 20 MG capsule Take 20 mg by mouth once.     FETZIMA 120 MG CP24   3   fluocinonide ointment (LIDEX) 0.05 % Apply as directed to skin twice a day 30 g 0   gabapentin (NEURONTIN) 300 MG capsule Take 1 capsule (300 mg total) by mouth 3 (three) times daily. Take first dose 1 hour before arrival for surgery. 21 capsule 0   ketoconazole (NIZORAL) 2 % cream Apply  a small amount to affected area on left foot/toe once a day 30 g 0   Levomilnacipran HCl ER (FETZIMA) 120 MG CP24 Take 1 capsule by mouth daily. 90 capsule 3   metFORMIN (GLUCOPHAGE) 500 MG tablet Take 1 tablet (500 mg total) by mouth daily. 90 tablet 1   metFORMIN (GLUCOPHAGE) 500 MG tablet Take 1 tablet (500 mg total) by mouth 2 (two) times daily. 180 tablet 1   metFORMIN (GLUCOPHAGE-XR) 500 MG 24 hr tablet Take 1 tablet (500 mg total) by mouth daily. Please do not fill if not picked up within 30 days.  30 tablet 0   methocarbamol (ROBAXIN) 500 MG tablet Take 1 tablet (500 mg total) by mouth every 6 (six) hours as needed for muscle spasms. (Patient not taking: Reported on 05/22/2018) 45 tablet 0   ondansetron (ZOFRAN) 4 MG tablet Take 1 tablet (4 mg total) by mouth every 6 (six) hours for nausea. TAKE 4 tablets 15 minutes prior to arrival for surgery 30 tablet 0   ondansetron (ZOFRAN) 4 MG tablet Take 1 tablet  by mouth every 6 hours as needed for nausea 15 tablet 0   oxyCODONE (OXY IR/ROXICODONE) 5 MG immediate release tablet Take 1-2 tablet by mouth every six to eight hours as needed for Post Op Pain. DO NOT EXCEED  6 tablets in 24 hours. 30 tablet 0   polyethylene glycol (MIRALAX / GLYCOLAX) packet Take 17 g by mouth daily.     polyethylene glycol-electrolytes (NULYTELY) 420 g solution Take 4,000 mLs by mouth Nightly. 4000 mL 0   Semaglutide-Weight Management (WEGOVY) 2.4 MG/0.75ML SOAJ Inject 2.4 mg into the skin once a week for weight loss. 3 mL 0   sulfamethoxazole-trimethoprim (BACTRIM DS) 800-160 MG tablet Take 1 tablet by mouth 2 (two) times daily as directed. Start the day after surgery. 10 tablet 0   SUMAtriptan (IMITREX) 50 MG tablet TAKE 1 TABLET BY MOUTH AT ONSET OF HEADACHE, MAY REPEAT 1 DOSE AFTER 2 HOURS IF NEEDED 10 tablet 5   SUMAtriptan (IMITREX) 50 MG tablet Take 1 tablet by mouth at onset of headache, may repeat same dose once 2 hours later if needed 10 tablet 5   SUMAtriptan (IMITREX) 50 MG tablet Take 1 tablet (50 mg total) by mouth at the onset of headache. May repeat 1 dose after 2 hours if needed. 10 tablet 5   SUMAtriptan (IMITREX) 50 MG tablet Take 1 tablet (50 mg total) by mouth at onset of headache,may repeat 1 dose after 2 hours if needed, 2 to 3 times a week 9 tablet 6   terbinafine (LAMISIL) 250 MG tablet Take 1 tablet (250 mg total) by mouth daily for Tinea 14 tablet 0   traZODone (DESYREL) 50 MG tablet Take 50 mg by mouth at bedtime.     traZODone (DESYREL) 50 MG  tablet TAKE 1 TO 2 TABLETS BY MOUTH AT BEDTIME 180 tablet 1   traZODone (DESYREL) 50 MG tablet Take 1-2 tablets (50-100 mg total) by mouth at bedtime. 180 tablet 1   traZODone (DESYREL) 50 MG tablet Take 1-2 tablets (50-100 mg total) by mouth at bedtime. 180 tablet 2   WEGOVY 0.5 MG/0.5ML SOAJ Inject 0.5 mg into the skin once a week for weight loss. 2 mL 0   WEGOVY 1 MG/0.5ML SOAJ Inject 1 mg into the skin once a week for weight loss 2 mL 0   WEGOVY 2.4 MG/0.75ML SOAJ Inject 2.4 mg into the skin once a week for weight loss.  3 mL 0   WEGOVY 2.4 MG/0.75ML SOAJ Inject 2.4 mg into the skin once a week fro weight loss 3 mL 0   WEGOVY 2.4 MG/0.75ML SOAJ Inject 2.4 mg into the skin once a week for weight loss 3 mL 0   WEGOVY 2.4 MG/0.75ML SOAJ Inject 2.4 mg,sub-q, into the skin once a week for weight loss 3 mL 0   WEGOVY 2.4 MG/0.75ML SOAJ Inject 2.4 mg into the skin once a week for weight loss. 3 mL 0   WEGOVY 2.4 MG/0.75ML SOAJ Inject 2.4 mg into the skin once a week for weight loss. 3 mL 0   WEGOVY 2.4 MG/0.75ML SOAJ Inject 2.4 mg into the skin once a week for weight loss 3 mL 0   No current facility-administered medications for this visit.    Medication Side Effects: None  Allergies:  Allergies  Allergen Reactions   Brewers Yeast Rash    Past Medical History:  Diagnosis Date   Depression    takes Welbutrin and Fetzima   GERD (gastroesophageal reflux disease)    takes nexium    Past Medical History, Surgical history, Social history, and Family history were reviewed and updated as appropriate.   Please see review of systems for further details on the patient's review from today.   Objective:   Physical Exam:  There were no vitals taken for this visit.  Physical Exam Constitutional:      General: She is not in acute distress. Musculoskeletal:        General: No deformity.  Neurological:     Mental Status: She is alert and oriented to person, place, and time.      Coordination: Coordination normal.  Psychiatric:        Attention and Perception: Attention and perception normal. She does not perceive auditory or visual hallucinations.        Mood and Affect: Affect is not labile, blunt, angry or inappropriate.        Speech: Speech normal.        Behavior: Behavior normal.        Thought Content: Thought content normal. Thought content is not paranoid or delusional. Thought content does not include homicidal or suicidal ideation. Thought content does not include homicidal or suicidal plan.        Cognition and Memory: Cognition and memory normal.        Judgment: Judgment normal.     Comments: Insight intact     Lab Review:     Component Value Date/Time   NA 140 09/01/2016 0852   K 4.4 09/01/2016 0852   CL 104 09/01/2016 0852   CO2 31 09/01/2016 0852   GLUCOSE 90 09/01/2016 0852   BUN 14 09/01/2016 0852   CREATININE 0.86 09/01/2016 0852   CALCIUM 9.2 09/01/2016 0852   PROT 7.0 09/01/2016 0852   ALBUMIN 4.3 09/01/2016 0852   AST 33 09/01/2016 0852   ALT 70 (H) 09/01/2016 0852   ALKPHOS 66 09/01/2016 0852   BILITOT 0.7 09/01/2016 0852       Component Value Date/Time   WBC 5.1 09/01/2016 0852   RBC 4.30 09/01/2016 0852   HGB 13.5 09/01/2016 0852   HCT 38.9 09/01/2016 0852   PLT 286.0 09/01/2016 0852   MCV 90.4 09/01/2016 0852   MCHC 34.6 09/01/2016 0852   RDW 11.9 09/01/2016 0852   LYMPHSABS 1.5 09/01/2016 0852   MONOABS 0.4 09/01/2016 0852   EOSABS 0.1 09/01/2016 0852   BASOSABS 0.0 09/01/2016 2130  No results found for: "POCLITH", "LITHIUM"   No results found for: "PHENYTOIN", "PHENOBARB", "VALPROATE", "CBMZ"   .res Assessment: Plan:    Recommendations/Plan   Continue Spravato treatment.  RTC in 2 days.   Patient advised to contact office with any questions, adverse effects, or acute worsening in signs and symptoms.  Diagnoses and all orders for this visit:  Recurrent major depression resistant to treatment  [facility])     Please see After Visit Summary for patient specific instructions.  Future Appointments  Date Time Provider Department Center  10/08/2023  1:50 PM [facility]    No orders of the defined types were placed in this encounter.   -------------------------------

## 2023-06-26 ENCOUNTER — Other Ambulatory Visit: Payer: Self-pay

## 2023-06-26 NOTE — Progress Notes (Signed)
Clinical Intervention Note  Clinical Intervention Notes: [name] reached out to pharmacy to advise that patient had potential adverse reaction to first 56mg  dose. Patient experienced nausea/vomiting which may or may not have been in relation to medication as per [name] this is not a side effect that they normally experience. Patient will not proceed with 84mg  dose on 11/6 as scheduled but will instead be receiving 56mg  dose again on 11/7 and will pre-medicate with ondansetron and meclizine. [name] and I discussed using 56mg  dose out of the additional 84mg  pack that the office has on hand to minimize potential waste if the patient does not tolerate next dose. She will contact me after Thursday's appointment to discuss how dose was tolerated and coordinate plan for following week if patient intends to continue.   Clinical Intervention Outcomes: Prevention of an adverse drug event   Otto Herb Specialty Pharmacist

## 2023-06-26 NOTE — Progress Notes (Signed)
NURSES NOTE:   Pt arrived for her 1st Spravato Treatment for treatment resistant depression, the starting dose is 56 mg (2 of the 28 mg nasal sprays) as tolerated and no side effects or complaints her dose will increase to 84 mg at next treatment. Pt sees [name], NP and she will follow her throughout her treatments and follow ups. Explained to pt and her sister how the treatments would be scheduled and answered any questions she had today. She was given a practice nasal trainer to try a few times before given the one with medication. She verbalized understanding. Pt's Spravato is ordered through [facility].  Spravato medication is stored at treatment center per REMS/FDA guidelines. The medication is required to be locked behind two doors per REMS/FDA protocol. Medication is also disposed of properly after each use per regulations. All documentation for REMS is completed and submitted per FDA/REMS requirements.          Began taking patient's vital signs at 10:30 AM 128/87, pulse 70, Pulse Ox 99%. Instructed patient to blow her nose if needed then recline back to a 45 degree angle if that was more comfortable for her. Gave patient first dose 28 mg nasal spray, administered in each nostril as directed and observed by nurse, waited 5 more minutes for the second dose. After both doses given pt did not complain of any nausea/vomiting, pt did have a drink to help with the taste of Spravato it gives the metallic taste. Prior to her 40 minute check her sister came out of the room to report pt was nauseated and vomiting, in room pt standing up holding trash can and vomiting quite a bit. Pt then sat down, recommend she try a Zofran 4 mg which she did and it did help. Vital Signs at 11:15 AM, 138/90, pulse 62. Pulse ox 96%. Pt reports doing fine, no complaints voiced  Explained she would be monitored for a total time of 120 minutes. Pt had her sister in the room most of the  treatment except at the end. Almira Coaster was aware of pt's N/V and given Zofran. After discussion about next treatment, it was decided pt would take a Meclizine 25 mg and Zofran 4 mg prior to coming to her treatment to see if that would help. Pt will also be getting another 56 mg Spravato since it was not tolerated too well the first day. Discharge vitals were taken at 12:16 PM 134/88, P 63, Pulse Ox 96%. Recommend she go home and sleep or just relax on the couch. No driving, no intense activities. Verbalized understanding. Pt. will be receiving 2 treatments per week for 4 weeks as recommended. Nurse was with pt a total of 60 minutes for clinical assessment. Pt is scheduled this Thursday, November 7th. Pt instructed to call office with any problems or questions prior to next treatment.      LOT 23MG 500 FEB 2027

## 2023-06-27 ENCOUNTER — Other Ambulatory Visit: Payer: Self-pay

## 2023-06-28 ENCOUNTER — Ambulatory Visit: Payer: Commercial Managed Care - PPO

## 2023-06-28 ENCOUNTER — Ambulatory Visit (INDEPENDENT_AMBULATORY_CARE_PROVIDER_SITE_OTHER): Payer: Commercial Managed Care - PPO | Admitting: Adult Health

## 2023-06-28 ENCOUNTER — Other Ambulatory Visit: Payer: Self-pay

## 2023-06-28 ENCOUNTER — Other Ambulatory Visit (HOSPITAL_COMMUNITY): Payer: Self-pay

## 2023-06-28 VITALS — BP 133/82 | HR 71

## 2023-06-28 DIAGNOSIS — F339 Major depressive disorder, recurrent, unspecified: Secondary | ICD-10-CM

## 2023-06-28 MED ORDER — ONDANSETRON 4 MG PO TBDP
4.0000 mg | ORAL_TABLET | Freq: Three times a day (TID) | ORAL | 1 refills | Status: DC | PRN
  Filled 2023-06-28: qty 20, 7d supply, fill #0
  Filled 2023-09-11: qty 20, 7d supply, fill #1

## 2023-06-28 MED ORDER — MECLIZINE HCL 25 MG PO TABS
25.0000 mg | ORAL_TABLET | ORAL | 0 refills | Status: DC
  Filled 2023-06-28: qty 60, 60d supply, fill #0
  Filled 2023-06-28: qty 30, fill #0

## 2023-06-28 NOTE — Progress Notes (Signed)
NURSES NOTE:   Pt arrived for her #2 Spravato Treatment for treatment resistant depression, the starting dose is 56 mg (2 of the 28 mg nasal sprays) as tolerated due to pt getting sick for the first treatment today's dose will be 56 mg again. I reached out to [facility] and they instructed me to use the box already sent over of the 84 mg but only use 2 devices then we would see how she does and proceed from there.  Pt sees [name], NP and she will follow her throughout her treatments and follow ups. Pt voiced no complaints or concerns after leaving on Monday after her treatment, the Zofran helped her symptoms and she felt fine later in the day. Pt's Spravato is ordered through [facility].  Spravato medication is stored at treatment center per REMS/FDA guidelines. The medication is required to be locked behind two doors per REMS/FDA protocol. Medication is also disposed of properly after each use per regulations. All documentation for REMS is completed and submitted per FDA/REMS requirements.          Pt was brought today by her husband, Loraine Leriche. Pt did take Zofran 4 mg and Meclizine 25 mg prior to arriving at office. Began taking patient's vital signs at 11:50 AM 130/79, pulse 70, Pulse Ox 99%. Instructed patient to blow her nose if needed then recline back to a 45 degree angle if that was more comfortable for her. Gave patient first dose 28 mg nasal spray, administered in each nostril as directed and observed by nurse, waited 5 more minutes for the second dose. After both doses given pt did not complain of any nausea/vomiting, pt did have a drink to help with the taste of Spravato it gives the metallic taste. Checked on pt and she reports feeling okay so far, her 40 minute check was at 12:30 PM, Vital signs were 136/85, pulse 69. Pulse ox 96%. Pt reports doing fine, no complaints voiced  Explained she would be monitored for a total time of 120 minutes.   Discharge vitals were taken at 2:00 PM 133/82, P 71, Pulse Ox 96%. Pt met with Rene Kocher to discuss her treatment, it was decided we would continue 56 mg for now. I will update [facility]. Recommend she go home and sleep or just relax on the couch. No driving, no intense activities. Verbalized understanding. Pt did much better today, taking the Meclizine and Zofran an hour prior to her arrival helped. Pt. will be receiving 2 treatments per week for 4 weeks as recommended. Nurse was with pt a total of 60 minutes for clinical assessment. Pt is scheduled this coming Tuesday, November 12th. Pt instructed to call office with any problems or questions prior to next treatment.      LOT [id number] FEB 2027

## 2023-06-28 NOTE — Progress Notes (Signed)
Robin Stevenson [id number] 22-Aug-1975 47 y.o.  Subjective:   Patient ID:  Robin Stevenson is a 47 y.o. (DOB Jul 17, 1976) female.  Chief Complaint: No chief complaint on file.   HPI Robin Stevenson presents to the office today for follow-up of treatment resistant depression (TRD).  Spravato treatment    Patient was administered Spravato 56 mg intranasally today. Patient was observed by provider throughout [facility] treatment. The patient experienced the typical dissociation which gradually resolved over the 2-hour period of observation. There were no complications. Specifically, the patient did not have any untoward side effects - feeling disconnected from themself, their thoughts, feelings and things around them, dizziness, nausea, feeling sleepy, decreased feeling of sensitivity (numbness) spinning sensation, feeling anxious, lack of energy, increased blood pressure, feeling happy or very excited, or headache. Blood pressures remained within normal ranges at the 40-minute and 2-hour follow-up intervals. By the time the 2-hour observation period was met the patient was alert and oriented and able to exit without assistance. Patient willing to continue Spravato administration for the treatment of resistant depression. See nursing note for further details.  Review of Systems:  Review of Systems  Musculoskeletal:  Negative for gait problem.  Neurological:  Negative for tremors.  Psychiatric/Behavioral:         Please refer to HPI    Medications: I have reviewed the patient's current medications.  Current Outpatient Medications  Medication Sig Dispense Refill   ALPRAZolam (XANAX) 0.5 MG tablet Take 1 tablet at bedtime the night before procedure. Take 1 tablet 1 hour before arrival. Bring third tablet with you to procedure. 3 tablet 0   atorvastatin (LIPITOR) 20 MG tablet Take 1 tablet (20 mg total) by mouth daily. 30 tablet 5   atovaquone-proguanil (MALARONE) 250-100 MG TABS tablet  Take 1 tablet by mouth once daily begin 2 days prearrival,take daily during stay & continue for 7 days posttravel for malaria prevention 24 tablet 0   azithromycin (ZITHROMAX) 500 MG tablet For non bloody diarrhea,take 2 tabs on day 1, if resolved,stop med,If diarrhea persists 1 tab on day 2 & 3. For bloody diarrhea,2 tabs on day 1 & 1 tab on day 2 & 3 4 tablet 0   buPROPion (WELLBUTRIN XL) 150 MG 24 hr tablet   3   buPROPion (WELLBUTRIN XL) 150 MG 24 hr tablet Take 1 tablet (150 mg total) by mouth every morning. 90 tablet 0   cariprazine (VRAYLAR) 1.5 MG capsule Take 1 capsule (1.5 mg total) by mouth daily. 28 capsule 0   celecoxib (CELEBREX) 200 MG capsule Take 1 capsule by mouth single dose as directed. Take 1 hour prior to arrival for surgery 1 capsule 0   Esketamine HCl, 56 MG Dose, (SPRAVATO, 56 MG DOSE,) 28 MG/DEVICE SOPK Dispense X 2 (28mg ) devices, administer on day 1 intranasally as directed 2 each 0   Esketamine HCl, 84 MG Dose, (SPRAVATO, 84 MG DOSE,) 28 MG/DEVICE SOPK Dispense X 3 (28 mg) devices for treatments twice per week administer intranasally 3 each 5   esomeprazole (NEXIUM) 20 MG capsule Take 20 mg by mouth once.     FETZIMA 120 MG CP24   3   fluocinonide ointment (LIDEX) 0.05 % Apply as directed to skin twice a day 30 g 0   gabapentin (NEURONTIN) 300 MG capsule Take 1 capsule (300 mg total) by mouth 3 (three) times daily. Take first dose 1 hour before arrival for surgery. 21 capsule 0   ketoconazole (NIZORAL) 2 % cream  Apply a small amount to affected area on left foot/toe once a day 30 g 0   Levomilnacipran HCl ER (FETZIMA) 120 MG CP24 Take 1 capsule by mouth daily. 90 capsule 3   meclizine (ANTIVERT) 25 MG tablet Take 1 tablet (25 mg total) by mouth as directed prior to treatment. 60 tablet 0   metFORMIN (GLUCOPHAGE) 500 MG tablet Take 1 tablet (500 mg total) by mouth daily. 90 tablet 1   metFORMIN (GLUCOPHAGE) 500 MG tablet Take 1 tablet (500 mg total) by mouth 2 (two) times  daily. 180 tablet 1   metFORMIN (GLUCOPHAGE-XR) 500 MG 24 hr tablet Take 1 tablet (500 mg total) by mouth daily. Please do not fill if not picked up within 30 days. 30 tablet 0   methocarbamol (ROBAXIN) 500 MG tablet Take 1 tablet (500 mg total) by mouth every 6 (six) hours as needed for muscle spasms. (Patient not taking: Reported on 05/22/2018) 45 tablet 0   ondansetron (ZOFRAN) 4 MG tablet Take 1 tablet (4 mg total) by mouth every 6 (six) hours for nausea. TAKE 4 tablets 15 minutes prior to arrival for surgery 30 tablet 0   ondansetron (ZOFRAN) 4 MG tablet Take 1 tablet  by mouth every 6 hours as needed for nausea 15 tablet 0   ondansetron (ZOFRAN-ODT) 4 MG disintegrating tablet Dissolve 1 tablet (4 mg total) by mouth every 8 (eight) hours as needed for nausea or vomiting. 20 tablet 1   oxyCODONE (OXY IR/ROXICODONE) 5 MG immediate release tablet Take 1-2 tablet by mouth every six to eight hours as needed for Post Op Pain. DO NOT EXCEED  6 tablets in 24 hours. 30 tablet 0   polyethylene glycol (MIRALAX / GLYCOLAX) packet Take 17 g by mouth daily.     polyethylene glycol-electrolytes (NULYTELY) 420 g solution Take 4,000 mLs by mouth Nightly. 4000 mL 0   Semaglutide-Weight Management (WEGOVY) 2.4 MG/0.75ML SOAJ Inject 2.4 mg into the skin once a week for weight loss. 3 mL 0   sulfamethoxazole-trimethoprim (BACTRIM DS) 800-160 MG tablet Take 1 tablet by mouth 2 (two) times daily as directed. Start the day after surgery. 10 tablet 0   SUMAtriptan (IMITREX) 50 MG tablet TAKE 1 TABLET BY MOUTH AT ONSET OF HEADACHE, MAY REPEAT 1 DOSE AFTER 2 HOURS IF NEEDED 10 tablet 5   SUMAtriptan (IMITREX) 50 MG tablet Take 1 tablet by mouth at onset of headache, may repeat same dose once 2 hours later if needed 10 tablet 5   SUMAtriptan (IMITREX) 50 MG tablet Take 1 tablet (50 mg total) by mouth at the onset of headache. May repeat 1 dose after 2 hours if needed. 10 tablet 5   SUMAtriptan (IMITREX) 50 MG tablet Take 1  tablet (50 mg total) by mouth at onset of headache,may repeat 1 dose after 2 hours if needed, 2 to 3 times a week 9 tablet 6   terbinafine (LAMISIL) 250 MG tablet Take 1 tablet (250 mg total) by mouth daily for Tinea 14 tablet 0   traZODone (DESYREL) 50 MG tablet Take 50 mg by mouth at bedtime.     traZODone (DESYREL) 50 MG tablet TAKE 1 TO 2 TABLETS BY MOUTH AT BEDTIME 180 tablet 1   traZODone (DESYREL) 50 MG tablet Take 1-2 tablets (50-100 mg total) by mouth at bedtime. 180 tablet 1   traZODone (DESYREL) 50 MG tablet Take 1-2 tablets (50-100 mg total) by mouth at bedtime. 180 tablet 2   WEGOVY 0.5 MG/0.5ML SOAJ Inject  0.5 mg into the skin once a week for weight loss. 2 mL 0   WEGOVY 1 MG/0.5ML SOAJ Inject 1 mg into the skin once a week for weight loss 2 mL 0   WEGOVY 2.4 MG/0.75ML SOAJ Inject 2.4 mg into the skin once a week for weight loss. 3 mL 0   WEGOVY 2.4 MG/0.75ML SOAJ Inject 2.4 mg into the skin once a week fro weight loss 3 mL 0   WEGOVY 2.4 MG/0.75ML SOAJ Inject 2.4 mg into the skin once a week for weight loss 3 mL 0   WEGOVY 2.4 MG/0.75ML SOAJ Inject 2.4 mg,sub-q, into the skin once a week for weight loss 3 mL 0   WEGOVY 2.4 MG/0.75ML SOAJ Inject 2.4 mg into the skin once a week for weight loss. 3 mL 0   WEGOVY 2.4 MG/0.75ML SOAJ Inject 2.4 mg into the skin once a week for weight loss. 3 mL 0   WEGOVY 2.4 MG/0.75ML SOAJ Inject 2.4 mg into the skin once a week for weight loss 3 mL 0   No current facility-administered medications for this visit.    Medication Side Effects: None  Allergies:  Allergies  Allergen Reactions   Brewers Yeast Rash    Past Medical History:  Diagnosis Date   Depression    takes Welbutrin and Fetzima   GERD (gastroesophageal reflux disease)    takes nexium    Past Medical History, Surgical history, Social history, and Family history were reviewed and updated as appropriate.   Please see review of systems for further details on the patient's  review from today.   Objective:   Physical Exam:  There were no vitals taken for this visit.  Physical Exam Constitutional:      General: She is not in acute distress. Musculoskeletal:        General: No deformity.  Neurological:     Mental Status: She is alert and oriented to person, place, and time.     Coordination: Coordination normal.  Psychiatric:        Attention and Perception: Attention and perception normal. She does not perceive auditory or visual hallucinations.        Mood and Affect: Affect is not labile, blunt, angry or inappropriate.        Speech: Speech normal.        Behavior: Behavior normal.        Thought Content: Thought content normal. Thought content is not paranoid or delusional. Thought content does not include homicidal or suicidal ideation. Thought content does not include homicidal or suicidal plan.        Cognition and Memory: Cognition and memory normal.        Judgment: Judgment normal.     Comments: Insight intact     Lab Review:     Component Value Date/Time   NA 140 09/01/2016 0852   K 4.4 09/01/2016 0852   CL 104 09/01/2016 0852   CO2 31 09/01/2016 0852   GLUCOSE 90 09/01/2016 0852   BUN 14 09/01/2016 0852   CREATININE 0.86 09/01/2016 0852   CALCIUM 9.2 09/01/2016 0852   PROT 7.0 09/01/2016 0852   ALBUMIN 4.3 09/01/2016 0852   AST 33 09/01/2016 0852   ALT 70 (H) 09/01/2016 0852   ALKPHOS 66 09/01/2016 0852   BILITOT 0.7 09/01/2016 0852       Component Value Date/Time   WBC 5.1 09/01/2016 0852   RBC 4.30 09/01/2016 0852   HGB 13.5 09/01/2016 4098  HCT 38.9 09/01/2016 0852   PLT 286.0 09/01/2016 0852   MCV 90.4 09/01/2016 0852   MCHC 34.6 09/01/2016 0852   RDW 11.9 09/01/2016 0852   LYMPHSABS 1.5 09/01/2016 0852   MONOABS 0.4 09/01/2016 0852   EOSABS 0.1 09/01/2016 0852   BASOSABS 0.0 09/01/2016 0852    No results found for: "POCLITH", "LITHIUM"   No results found for: "PHENYTOIN", "PHENOBARB", "VALPROATE", "CBMZ"    .res Assessment: Plan:   Recommendations/Plan   Continue Spravato treatment.   Patient advised to contact office with any questions, adverse effects, or acute worsening in signs and symptoms. There are no diagnoses linked to this encounter.   Please see After Visit Summary for patient specific instructions.  Future Appointments  Date Time Provider Department Center  10/08/2023  1:50 PM [facility]    No orders of the defined types were placed in this encounter.   -------------------------------

## 2023-06-29 ENCOUNTER — Encounter: Payer: Self-pay | Admitting: Adult Health

## 2023-06-29 ENCOUNTER — Other Ambulatory Visit: Payer: Self-pay

## 2023-06-29 NOTE — Progress Notes (Signed)
Specialty Pharmacy Refill Coordination Note  Robin Stevenson is a 47 y.o. female contacted today regarding refills of specialty medication(s) Esketamine Hcl   Patient requested Courier to Provider Office   Delivery date: 07/02/23   Verified address: [facility] ATTN [name] [address] [place], [place] [address]   Medication will be filled on 07/02/23.   Spoke with [name] who states patient tolerated 56mg  dose on 11/7 with the anti-nausea regimen. She will try 56mg  again on 11/12 and [name] will follow up with me to let me know what to send for Thursday 11/14 appt.

## 2023-07-02 ENCOUNTER — Other Ambulatory Visit: Payer: Self-pay

## 2023-07-02 DIAGNOSIS — D2372 Other benign neoplasm of skin of left lower limb, including hip: Secondary | ICD-10-CM | POA: Diagnosis not present

## 2023-07-02 DIAGNOSIS — L2989 Other pruritus: Secondary | ICD-10-CM | POA: Diagnosis not present

## 2023-07-02 DIAGNOSIS — D2272 Melanocytic nevi of left lower limb, including hip: Secondary | ICD-10-CM | POA: Diagnosis not present

## 2023-07-02 DIAGNOSIS — D225 Melanocytic nevi of trunk: Secondary | ICD-10-CM | POA: Diagnosis not present

## 2023-07-02 DIAGNOSIS — D2262 Melanocytic nevi of left upper limb, including shoulder: Secondary | ICD-10-CM | POA: Diagnosis not present

## 2023-07-02 DIAGNOSIS — D2261 Melanocytic nevi of right upper limb, including shoulder: Secondary | ICD-10-CM | POA: Diagnosis not present

## 2023-07-02 DIAGNOSIS — L820 Inflamed seborrheic keratosis: Secondary | ICD-10-CM | POA: Diagnosis not present

## 2023-07-02 DIAGNOSIS — L821 Other seborrheic keratosis: Secondary | ICD-10-CM | POA: Diagnosis not present

## 2023-07-02 NOTE — Progress Notes (Signed)
07/02/23 [employer] Authorization Code [id number]

## 2023-07-03 ENCOUNTER — Other Ambulatory Visit: Payer: Self-pay

## 2023-07-03 ENCOUNTER — Ambulatory Visit (INDEPENDENT_AMBULATORY_CARE_PROVIDER_SITE_OTHER): Payer: Commercial Managed Care - PPO | Admitting: Adult Health

## 2023-07-03 ENCOUNTER — Encounter: Payer: Self-pay | Admitting: Adult Health

## 2023-07-03 ENCOUNTER — Other Ambulatory Visit (HOSPITAL_COMMUNITY): Payer: Self-pay

## 2023-07-03 ENCOUNTER — Ambulatory Visit: Payer: Commercial Managed Care - PPO

## 2023-07-03 VITALS — BP 138/86 | HR 76

## 2023-07-03 DIAGNOSIS — F339 Major depressive disorder, recurrent, unspecified: Secondary | ICD-10-CM | POA: Diagnosis not present

## 2023-07-03 MED ORDER — SPRAVATO (56 MG DOSE) 28 MG/DEVICE NA SOPK
PACK | NASAL | 0 refills | Status: DC
Start: 2023-07-03 — End: 2024-04-29
  Filled 2023-07-03: qty 4, 4d supply, fill #0

## 2023-07-03 MED ORDER — SPRAVATO (56 MG DOSE) 28 MG/DEVICE NA SOPK
PACK | NASAL | 1 refills | Status: DC
  Filled 2023-07-03: qty 2, fill #0

## 2023-07-03 NOTE — Progress Notes (Signed)
Robin Stevenson [id number] 07-17-1976 47 y.o.  Subjective:   Patient ID:  Robin Stevenson is a 47 y.o. (DOB 1976/04/07) female.  Chief Complaint: No chief complaint on file.   HPI Robin Stevenson presents to the office today for follow-up of treatment resistant depression (TRD).  Spravato treatment    Patient was administered Spravato 56 mg intranasally today. Patient was observed by provider throughout [facility] treatment. The patient experienced the typical dissociation which gradually resolved over the 2-hour period of observation. There were no complications. Specifically, the patient did not have any untoward side effects - feeling disconnected from themself, their thoughts, feelings and things around them, dizziness, nausea, feeling sleepy, decreased feeling of sensitivity (numbness) spinning sensation, feeling anxious, lack of energy, increased blood pressure, feeling happy or very excited, or headache. Blood pressures remained within normal ranges at the 40-minute and 2-hour follow-up intervals. By the time the 2-hour observation period was met the patient was alert and oriented and able to exit without assistance. Patient willing to continue Spravato administration for the treatment of resistant depression. See nursing note for further details.      Review of Systems:  Review of Systems  Musculoskeletal:  Negative for gait problem.  Neurological:  Negative for tremors.  Psychiatric/Behavioral:         Please refer to HPI    Medications: I have reviewed the patient's current medications.  Current Outpatient Medications  Medication Sig Dispense Refill   ALPRAZolam (XANAX) 0.5 MG tablet Take 1 tablet at bedtime the night before procedure. Take 1 tablet 1 hour before arrival. Bring third tablet with you to procedure. 3 tablet 0   atorvastatin (LIPITOR) 20 MG tablet Take 1 tablet (20 mg total) by mouth daily. 30 tablet 5   atovaquone-proguanil (MALARONE) 250-100 MG TABS  tablet Take 1 tablet by mouth once daily begin 2 days prearrival,take daily during stay & continue for 7 days posttravel for malaria prevention 24 tablet 0   azithromycin (ZITHROMAX) 500 MG tablet For non bloody diarrhea,take 2 tabs on day 1, if resolved,stop med,If diarrhea persists 1 tab on day 2 & 3. For bloody diarrhea,2 tabs on day 1 & 1 tab on day 2 & 3 4 tablet 0   buPROPion (WELLBUTRIN XL) 150 MG 24 hr tablet   3   buPROPion (WELLBUTRIN XL) 150 MG 24 hr tablet Take 1 tablet (150 mg total) by mouth every morning. 90 tablet 0   cariprazine (VRAYLAR) 1.5 MG capsule Take 1 capsule (1.5 mg total) by mouth daily. 28 capsule 0   celecoxib (CELEBREX) 200 MG capsule Take 1 capsule by mouth single dose as directed. Take 1 hour prior to arrival for surgery 1 capsule 0   Esketamine HCl, 56 MG Dose, (SPRAVATO, 56 MG DOSE,) 28 MG/DEVICE SOPK Dispense X 2 (28mg ) devices, administer on day 1 intranasally as directed 2 each 0   Esketamine HCl, 84 MG Dose, (SPRAVATO, 84 MG DOSE,) 28 MG/DEVICE SOPK Dispense X 3 (28 mg) devices for treatments twice per week administer intranasally 3 each 5   esomeprazole (NEXIUM) 20 MG capsule Take 20 mg by mouth once.     FETZIMA 120 MG CP24   3   fluocinonide ointment (LIDEX) 0.05 % Apply as directed to skin twice a day 30 g 0   gabapentin (NEURONTIN) 300 MG capsule Take 1 capsule (300 mg total) by mouth 3 (three) times daily. Take first dose 1 hour before arrival for surgery. 21 capsule 0   ketoconazole (  NIZORAL) 2 % cream Apply a small amount to affected area on left foot/toe once a day 30 g 0   Levomilnacipran HCl ER (FETZIMA) 120 MG CP24 Take 1 capsule by mouth daily. 90 capsule 3   meclizine (ANTIVERT) 25 MG tablet Take 1 tablet (25 mg total) by mouth as directed prior to treatment. 60 tablet 0   metFORMIN (GLUCOPHAGE) 500 MG tablet Take 1 tablet (500 mg total) by mouth daily. 90 tablet 1   metFORMIN (GLUCOPHAGE) 500 MG tablet Take 1 tablet (500 mg total) by mouth 2 (two)  times daily. 180 tablet 1   metFORMIN (GLUCOPHAGE-XR) 500 MG 24 hr tablet Take 1 tablet (500 mg total) by mouth daily. Please do not fill if not picked up within 30 days. 30 tablet 0   methocarbamol (ROBAXIN) 500 MG tablet Take 1 tablet (500 mg total) by mouth every 6 (six) hours as needed for muscle spasms. (Patient not taking: Reported on 05/22/2018) 45 tablet 0   ondansetron (ZOFRAN) 4 MG tablet Take 1 tablet (4 mg total) by mouth every 6 (six) hours for nausea. TAKE 4 tablets 15 minutes prior to arrival for surgery 30 tablet 0   ondansetron (ZOFRAN) 4 MG tablet Take 1 tablet  by mouth every 6 hours as needed for nausea 15 tablet 0   ondansetron (ZOFRAN-ODT) 4 MG disintegrating tablet Dissolve 1 tablet (4 mg total) by mouth every 8 (eight) hours as needed for nausea or vomiting. 20 tablet 1   oxyCODONE (OXY IR/ROXICODONE) 5 MG immediate release tablet Take 1-2 tablet by mouth every six to eight hours as needed for Post Op Pain. DO NOT EXCEED  6 tablets in 24 hours. 30 tablet 0   polyethylene glycol (MIRALAX / GLYCOLAX) packet Take 17 g by mouth daily.     polyethylene glycol-electrolytes (NULYTELY) 420 g solution Take 4,000 mLs by mouth Nightly. 4000 mL 0   Semaglutide-Weight Management (WEGOVY) 2.4 MG/0.75ML SOAJ Inject 2.4 mg into the skin once a week for weight loss. 3 mL 0   sulfamethoxazole-trimethoprim (BACTRIM DS) 800-160 MG tablet Take 1 tablet by mouth 2 (two) times daily as directed. Start the day after surgery. 10 tablet 0   SUMAtriptan (IMITREX) 50 MG tablet TAKE 1 TABLET BY MOUTH AT ONSET OF HEADACHE, MAY REPEAT 1 DOSE AFTER 2 HOURS IF NEEDED 10 tablet 5   SUMAtriptan (IMITREX) 50 MG tablet Take 1 tablet by mouth at onset of headache, may repeat same dose once 2 hours later if needed 10 tablet 5   SUMAtriptan (IMITREX) 50 MG tablet Take 1 tablet (50 mg total) by mouth at the onset of headache. May repeat 1 dose after 2 hours if needed. 10 tablet 5   SUMAtriptan (IMITREX) 50 MG tablet  Take 1 tablet (50 mg total) by mouth at onset of headache,may repeat 1 dose after 2 hours if needed, 2 to 3 times a week 9 tablet 6   terbinafine (LAMISIL) 250 MG tablet Take 1 tablet (250 mg total) by mouth daily for Tinea 14 tablet 0   traZODone (DESYREL) 50 MG tablet Take 50 mg by mouth at bedtime.     traZODone (DESYREL) 50 MG tablet TAKE 1 TO 2 TABLETS BY MOUTH AT BEDTIME 180 tablet 1   traZODone (DESYREL) 50 MG tablet Take 1-2 tablets (50-100 mg total) by mouth at bedtime. 180 tablet 1   traZODone (DESYREL) 50 MG tablet Take 1-2 tablets (50-100 mg total) by mouth at bedtime. 180 tablet 2   WEGOVY  0.5 MG/0.5ML SOAJ Inject 0.5 mg into the skin once a week for weight loss. 2 mL 0   WEGOVY 1 MG/0.5ML SOAJ Inject 1 mg into the skin once a week for weight loss 2 mL 0   WEGOVY 2.4 MG/0.75ML SOAJ Inject 2.4 mg into the skin once a week for weight loss. 3 mL 0   WEGOVY 2.4 MG/0.75ML SOAJ Inject 2.4 mg into the skin once a week fro weight loss 3 mL 0   WEGOVY 2.4 MG/0.75ML SOAJ Inject 2.4 mg into the skin once a week for weight loss 3 mL 0   WEGOVY 2.4 MG/0.75ML SOAJ Inject 2.4 mg,sub-q, into the skin once a week for weight loss 3 mL 0   WEGOVY 2.4 MG/0.75ML SOAJ Inject 2.4 mg into the skin once a week for weight loss. 3 mL 0   WEGOVY 2.4 MG/0.75ML SOAJ Inject 2.4 mg into the skin once a week for weight loss. 3 mL 0   WEGOVY 2.4 MG/0.75ML SOAJ Inject 2.4 mg into the skin once a week for weight loss 3 mL 0   No current facility-administered medications for this visit.    Medication Side Effects: None  Allergies:  Allergies  Allergen Reactions   Brewers Yeast Rash    Past Medical History:  Diagnosis Date   Depression    takes Welbutrin and Fetzima   GERD (gastroesophageal reflux disease)    takes nexium    Past Medical History, Surgical history, Social history, and Family history were reviewed and updated as appropriate.   Please see review of systems for further details on the  patient's review from today.   Objective:   Physical Exam:  There were no vitals taken for this visit.  Physical Exam Constitutional:      General: She is not in acute distress. Musculoskeletal:        General: No deformity.  Neurological:     Mental Status: She is alert and oriented to person, place, and time.     Coordination: Coordination normal.  Psychiatric:        Attention and Perception: Attention and perception normal. She does not perceive auditory or visual hallucinations.        Mood and Affect: Mood is depressed. Affect is not labile, blunt, angry or inappropriate.        Speech: Speech normal.        Behavior: Behavior normal.        Thought Content: Thought content normal. Thought content is not paranoid or delusional. Thought content does not include homicidal or suicidal ideation. Thought content does not include homicidal or suicidal plan.        Cognition and Memory: Cognition and memory normal.        Judgment: Judgment normal.     Comments: Insight intact     Lab Review:     Component Value Date/Time   NA 140 09/01/2016 0852   K 4.4 09/01/2016 0852   CL 104 09/01/2016 0852   CO2 31 09/01/2016 0852   GLUCOSE 90 09/01/2016 0852   BUN 14 09/01/2016 0852   CREATININE 0.86 09/01/2016 0852   CALCIUM 9.2 09/01/2016 0852   PROT 7.0 09/01/2016 0852   ALBUMIN 4.3 09/01/2016 0852   AST 33 09/01/2016 0852   ALT 70 (H) 09/01/2016 0852   ALKPHOS 66 09/01/2016 0852   BILITOT 0.7 09/01/2016 0852       Component Value Date/Time   WBC 5.1 09/01/2016 0852   RBC 4.30 09/01/2016 0865  HGB 13.5 09/01/2016 0852   HCT 38.9 09/01/2016 0852   PLT 286.0 09/01/2016 0852   MCV 90.4 09/01/2016 0852   MCHC 34.6 09/01/2016 0852   RDW 11.9 09/01/2016 0852   LYMPHSABS 1.5 09/01/2016 0852   MONOABS 0.4 09/01/2016 0852   EOSABS 0.1 09/01/2016 0852   BASOSABS 0.0 09/01/2016 0852    No results found for: "POCLITH", "LITHIUM"   No results found for: "PHENYTOIN",  "PHENOBARB", "VALPROATE", "CBMZ"   .res Assessment: Plan:    Recommendations/Plan   Continue Spravato treatment.   Patient advised to contact office with any questions, adverse effects, or acute worsening in signs and symptoms.  Diagnoses and all orders for this visit:  Recurrent major depression resistant to treatment [facility])     Please see After Visit Summary for patient specific instructions.  Future Appointments  Date Time Provider Department Center  07/05/2023  1:00 PM CP-NURSE [facility] None  07/05/2023  1:00 PM Donasia Wimes, Thereasa Solo, NP [facility] None  10/08/2023  1:50 PM [facility] 2 [facility]    No orders of the defined types were placed in this encounter.   -------------------------------

## 2023-07-03 NOTE — Progress Notes (Signed)
NURSES NOTE:   Pt arrived for her #3 Spravato Treatment for treatment resistant depression, the starting dose is 56 mg (2 of the 28 mg nasal sprays) as tolerated due to pt getting sick for the first treatment today's dose will be 56 mg again. I reached out to [facility] and they instructed me to use the box already opened that had 1 device still in there. I used that and a new box of 56 mg Spravato again only using 1 device. Pt sees [name], NP and she will follow her throughout her treatments and follow ups. Pt voiced no complaints or concerns after leaving on Monday after her treatment, the Zofran helped her symptoms and she felt fine later in the day. Pt's Spravato is ordered through [facility].  Spravato medication is stored at treatment center per REMS/FDA guidelines. The medication is required to be locked behind two doors per REMS/FDA protocol. Medication is also disposed of properly after each use per regulations. All documentation for REMS is completed and submitted per FDA/REMS requirements.          Pt directed to treatment room, started vitals at 9:00 AM, 124/76, pulse 80. Pt did take Zofran 4 mg and Meclizine 25 mg prior to arriving at office. Instructed patient to blow her nose if needed then recline back to a 45 degree angle if that was more comfortable for her. Gave patient first dose 28 mg nasal spray, administered in each nostril as directed and observed by nurse, waited 5 more minutes for the second dose. After both doses given pt did not complain of any nausea/vomiting, pt did have a drink to help with the taste of Spravato it gives the metallic taste. Checked on pt and she reports feeling okay so far, her 40 minute check was at 9:48 AM, Vital signs were 132/84, pulse 74. Pulse ox 96%. Pt reports doing fine, no complaints voiced  Explained she would be monitored for a total time of 120 minutes.  Discharge vitals were taken at 11:15 AM  138/86, P 76, Pulse Ox 96%. Pt met with Rene Kocher to discuss her treatment, it was decided we would continue 56 mg for now. I will update [facility]. Recommend she go home and sleep or just relax on the couch. No driving, no intense activities. Verbalized understanding. Pt did much better today as well as her 2nd treatment, taking the Meclizine and Zofran an hour prior to her arrival helped. Pt. will be receiving 2 treatments per week for 4 weeks as recommended. Nurse was with pt a total of 60 minutes for clinical assessment. Pt is scheduled this coming Thursday, November 14th. Pt instructed to call office with any problems or questions prior to next treatment.      LOT [id number] one device from the 84 mg kit/box FEB 2027  LOT [id number] one device from the 56 mg kit/box FEB 2027

## 2023-07-03 NOTE — Progress Notes (Signed)
Specialty Pharmacy Refill Coordination Note  Robin Stevenson is a 47 y.o. female assessed today regarding refills of clinic administered specialty medication(s) Esketamine Hcl   Clinic requested Courier to Provider Office   Delivery date: 07/04/23   Verified address: [facility] ATTN [name] [address] [place], [place] [address]   Medication will be filled on 07/04/23.   Spoke with [name] and patient tolerated dose of 56mg  again today. Plan is to administer 56mg  again on Thursday 11/14 and then increase to 84mg  as tolerated next week. Will send 2 - 56mg  kits tomorrow to use one on 11/14 and the other with the remaining 28mg  they have on hand for total 84mg  next week. [name] will let us know how patient tolerates 84mg  and how to proceed.

## 2023-07-04 ENCOUNTER — Other Ambulatory Visit: Payer: Self-pay

## 2023-07-04 NOTE — Progress Notes (Signed)
07/04/23 fill REMS authorization code: [id number]

## 2023-07-05 ENCOUNTER — Ambulatory Visit: Payer: Commercial Managed Care - PPO | Admitting: Adult Health

## 2023-07-05 ENCOUNTER — Ambulatory Visit: Payer: Commercial Managed Care - PPO

## 2023-07-05 VITALS — BP 136/90 | HR 71

## 2023-07-05 DIAGNOSIS — F339 Major depressive disorder, recurrent, unspecified: Secondary | ICD-10-CM

## 2023-07-06 ENCOUNTER — Encounter: Payer: Self-pay | Admitting: Adult Health

## 2023-07-06 NOTE — Progress Notes (Signed)
Robin Stevenson [id number] Mar 03, 1976 47 y.o.  Subjective:   Patient ID:  Robin Stevenson is a 47 y.o. (DOB [birth year]) female.  Chief Complaint: No chief complaint on file.   HPI Robin Stevenson presents to the office today for follow-up of treatment resistant depression (TRD).  Spravato treatment    Patient was administered Spravato 56 mg intranasally today. Patient was observed by provider throughout [facility] treatment. The patient experienced the typical dissociation which gradually resolved over the 2-hour period of observation. There were no complications. Specifically, the patient did not have any untoward side effects - feeling disconnected from themself, their thoughts, feelings and things around them, dizziness, nausea, feeling sleepy, decreased feeling of sensitivity (numbness) spinning sensation, feeling anxious, lack of energy, increased blood pressure, feeling happy or very excited, or headache. Blood pressures remained within normal ranges at the 40-minute and 2-hour follow-up intervals. By the time the 2-hour observation period was met the patient was alert and oriented and able to exit without assistance. Patient willing to continue Spravato administration for the treatment of resistant depression. See nursing note for further details.      Review of Systems:  Review of Systems  Musculoskeletal:  Negative for gait problem.  Neurological:  Negative for tremors.  Psychiatric/Behavioral:         Please refer to HPI    Medications: I have reviewed the patient's current medications.  Current Outpatient Medications  Medication Sig Dispense Refill   ALPRAZolam (XANAX) 0.5 MG tablet Take 1 tablet at bedtime the night before procedure. Take 1 tablet 1 hour before arrival. Bring third tablet with you to procedure. 3 tablet 0   atorvastatin (LIPITOR) 20 MG tablet Take 1 tablet (20 mg total) by mouth daily. 30 tablet 5   atovaquone-proguanil (MALARONE) 250-100 MG TABS  tablet Take 1 tablet by mouth once daily begin 2 days prearrival,take daily during stay & continue for 7 days posttravel for malaria prevention 24 tablet 0   azithromycin (ZITHROMAX) 500 MG tablet For non bloody diarrhea,take 2 tabs on day 1, if resolved,stop med,If diarrhea persists 1 tab on day 2 & 3. For bloody diarrhea,2 tabs on day 1 & 1 tab on day 2 & 3 4 tablet 0   buPROPion (WELLBUTRIN XL) 150 MG 24 hr tablet   3   buPROPion (WELLBUTRIN XL) 150 MG 24 hr tablet Take 1 tablet (150 mg total) by mouth every morning. 90 tablet 0   cariprazine (VRAYLAR) 1.5 MG capsule Take 1 capsule (1.5 mg total) by mouth daily. 28 capsule 0   celecoxib (CELEBREX) 200 MG capsule Take 1 capsule by mouth single dose as directed. Take 1 hour prior to arrival for surgery 1 capsule 0   Esketamine HCl, 56 MG Dose, (SPRAVATO, 56 MG DOSE,) 28 MG/DEVICE SOPK Dispense X 2 (28mg ) devices, administer on day 1 intranasally as directed 4 each 0   Esketamine HCl, 84 MG Dose, (SPRAVATO, 84 MG DOSE,) 28 MG/DEVICE SOPK Dispense X 3 (28 mg) devices for treatments twice per week administer intranasally 3 each 5   esomeprazole (NEXIUM) 20 MG capsule Take 20 mg by mouth once.     FETZIMA 120 MG CP24   3   fluocinonide ointment (LIDEX) 0.05 % Apply as directed to skin twice a day 30 g 0   gabapentin (NEURONTIN) 300 MG capsule Take 1 capsule (300 mg total) by mouth 3 (three) times daily. Take first dose 1 hour before arrival for surgery. 21 capsule 0   ketoconazole (  NIZORAL) 2 % cream Apply a small amount to affected area on left foot/toe once a day 30 g 0   Levomilnacipran HCl ER (FETZIMA) 120 MG CP24 Take 1 capsule by mouth daily. 90 capsule 3   meclizine (ANTIVERT) 25 MG tablet Take 1 tablet (25 mg total) by mouth as directed prior to treatment. 60 tablet 0   metFORMIN (GLUCOPHAGE) 500 MG tablet Take 1 tablet (500 mg total) by mouth daily. 90 tablet 1   metFORMIN (GLUCOPHAGE) 500 MG tablet Take 1 tablet (500 mg total) by mouth 2 (two)  times daily. 180 tablet 1   metFORMIN (GLUCOPHAGE-XR) 500 MG 24 hr tablet Take 1 tablet (500 mg total) by mouth daily. Please do not fill if not picked up within 30 days. 30 tablet 0   methocarbamol (ROBAXIN) 500 MG tablet Take 1 tablet (500 mg total) by mouth every 6 (six) hours as needed for muscle spasms. (Patient not taking: Reported on 05/22/2018) 45 tablet 0   ondansetron (ZOFRAN) 4 MG tablet Take 1 tablet (4 mg total) by mouth every 6 (six) hours for nausea. TAKE 4 tablets 15 minutes prior to arrival for surgery 30 tablet 0   ondansetron (ZOFRAN) 4 MG tablet Take 1 tablet  by mouth every 6 hours as needed for nausea 15 tablet 0   ondansetron (ZOFRAN-ODT) 4 MG disintegrating tablet Dissolve 1 tablet (4 mg total) by mouth every 8 (eight) hours as needed for nausea or vomiting. 20 tablet 1   oxyCODONE (OXY IR/ROXICODONE) 5 MG immediate release tablet Take 1-2 tablet by mouth every six to eight hours as needed for Post Op Pain. DO NOT EXCEED  6 tablets in 24 hours. 30 tablet 0   polyethylene glycol (MIRALAX / GLYCOLAX) packet Take 17 g by mouth daily.     polyethylene glycol-electrolytes (NULYTELY) 420 g solution Take 4,000 mLs by mouth Nightly. 4000 mL 0   Semaglutide-Weight Management (WEGOVY) 2.4 MG/0.75ML SOAJ Inject 2.4 mg into the skin once a week for weight loss. 3 mL 0   sulfamethoxazole-trimethoprim (BACTRIM DS) 800-160 MG tablet Take 1 tablet by mouth 2 (two) times daily as directed. Start the day after surgery. 10 tablet 0   SUMAtriptan (IMITREX) 50 MG tablet TAKE 1 TABLET BY MOUTH AT ONSET OF HEADACHE, MAY REPEAT 1 DOSE AFTER 2 HOURS IF NEEDED 10 tablet 5   SUMAtriptan (IMITREX) 50 MG tablet Take 1 tablet by mouth at onset of headache, may repeat same dose once 2 hours later if needed 10 tablet 5   SUMAtriptan (IMITREX) 50 MG tablet Take 1 tablet (50 mg total) by mouth at the onset of headache. May repeat 1 dose after 2 hours if needed. 10 tablet 5   SUMAtriptan (IMITREX) 50 MG tablet  Take 1 tablet (50 mg total) by mouth at onset of headache,may repeat 1 dose after 2 hours if needed, 2 to 3 times a week 9 tablet 6   terbinafine (LAMISIL) 250 MG tablet Take 1 tablet (250 mg total) by mouth daily for Tinea 14 tablet 0   traZODone (DESYREL) 50 MG tablet Take 50 mg by mouth at bedtime.     traZODone (DESYREL) 50 MG tablet TAKE 1 TO 2 TABLETS BY MOUTH AT BEDTIME 180 tablet 1   traZODone (DESYREL) 50 MG tablet Take 1-2 tablets (50-100 mg total) by mouth at bedtime. 180 tablet 1   traZODone (DESYREL) 50 MG tablet Take 1-2 tablets (50-100 mg total) by mouth at bedtime. 180 tablet 2   WEGOVY  0.5 MG/0.5ML SOAJ Inject 0.5 mg into the skin once a week for weight loss. 2 mL 0   WEGOVY 1 MG/0.5ML SOAJ Inject 1 mg into the skin once a week for weight loss 2 mL 0   WEGOVY 2.4 MG/0.75ML SOAJ Inject 2.4 mg into the skin once a week for weight loss. 3 mL 0   WEGOVY 2.4 MG/0.75ML SOAJ Inject 2.4 mg into the skin once a week fro weight loss 3 mL 0   WEGOVY 2.4 MG/0.75ML SOAJ Inject 2.4 mg into the skin once a week for weight loss 3 mL 0   WEGOVY 2.4 MG/0.75ML SOAJ Inject 2.4 mg,sub-q, into the skin once a week for weight loss 3 mL 0   WEGOVY 2.4 MG/0.75ML SOAJ Inject 2.4 mg into the skin once a week for weight loss. 3 mL 0   WEGOVY 2.4 MG/0.75ML SOAJ Inject 2.4 mg into the skin once a week for weight loss. 3 mL 0   WEGOVY 2.4 MG/0.75ML SOAJ Inject 2.4 mg into the skin once a week for weight loss 3 mL 0   No current facility-administered medications for this visit.    Medication Side Effects: None  Allergies:  Allergies  Allergen Reactions   Brewers Yeast Rash    Past Medical History:  Diagnosis Date   Depression    takes Welbutrin and Fetzima   GERD (gastroesophageal reflux disease)    takes nexium    Past Medical History, Surgical history, Social history, and Family history were reviewed and updated as appropriate.   Please see review of systems for further details on the  patient's review from today.   Objective:   Physical Exam:  There were no vitals taken for this visit.  Physical Exam Constitutional:      General: She is not in acute distress. Musculoskeletal:        General: No deformity.  Neurological:     Mental Status: She is alert and oriented to person, place, and time.     Coordination: Coordination normal.  Psychiatric:        Attention and Perception: Attention and perception normal. She does not perceive auditory or visual hallucinations.        Mood and Affect: Mood is depressed. Affect is not labile, blunt, angry or inappropriate.        Speech: Speech normal.        Behavior: Behavior normal.        Thought Content: Thought content normal. Thought content is not paranoid or delusional. Thought content does not include homicidal or suicidal ideation. Thought content does not include homicidal or suicidal plan.        Cognition and Memory: Cognition and memory normal.        Judgment: Judgment normal.     Comments: Insight intact     Lab Review:     Component Value Date/Time   NA 140 09/01/2016 0852   K 4.4 09/01/2016 0852   CL 104 09/01/2016 0852   CO2 31 09/01/2016 0852   GLUCOSE 90 09/01/2016 0852   BUN 14 09/01/2016 0852   CREATININE 0.86 09/01/2016 0852   CALCIUM 9.2 09/01/2016 0852   PROT 7.0 09/01/2016 0852   ALBUMIN 4.3 09/01/2016 0852   AST 33 09/01/2016 0852   ALT 70 (H) 09/01/2016 0852   ALKPHOS 66 09/01/2016 0852   BILITOT 0.7 09/01/2016 0852       Component Value Date/Time   WBC 5.1 09/01/2016 0852   RBC 4.30 09/01/2016 7829  HGB 13.5 09/01/2016 0852   HCT 38.9 09/01/2016 0852   PLT 286.0 09/01/2016 0852   MCV 90.4 09/01/2016 0852   MCHC 34.6 09/01/2016 0852   RDW 11.9 09/01/2016 0852   LYMPHSABS 1.5 09/01/2016 0852   MONOABS 0.4 09/01/2016 0852   EOSABS 0.1 09/01/2016 0852   BASOSABS 0.0 09/01/2016 0852    No results found for: "POCLITH", "LITHIUM"   No results found for: "PHENYTOIN",  "PHENOBARB", "VALPROATE", "CBMZ"   .res Assessment: Plan:    Recommendations/Plan   Continue Spravato treatment.   Patient advised to contact office with any questions, adverse effects, or acute worsening in signs and symptoms.  There are no diagnoses linked to this encounter.   Please see After Visit Summary for patient specific instructions.  Future Appointments  Date Time Provider Department Center  10/08/2023  1:50 PM [facility] 2 [facility]    No orders of the defined types were placed in this encounter.   -------------------------------

## 2023-07-09 NOTE — Progress Notes (Signed)
NURSES NOTE:   Pt arrived for her #4 Spravato Treatment for treatment resistant depression, the starting dose is 56 mg (2 of the 28 mg nasal sprays) as tolerated due to pt getting sick for the first treatment today's dose will be 56 mg again. I reached out to [facility] and they instructed me to use the box already opened that had 1 device still in there. I used that and a new box of 56 mg Spravato again only using 1 device. Pt sees [name], NP and she will follow her throughout her treatments and follow ups. Pt voiced no complaints or concerns after leaving on Monday after her treatment, the Zofran helped her symptoms and she felt fine later in the day. Pt's Spravato is ordered through [facility].  Spravato medication is stored at treatment center per REMS/FDA guidelines. The medication is required to be locked behind two doors per REMS/FDA protocol. Medication is also disposed of properly after each use per regulations. All documentation for REMS is completed and submitted per FDA/REMS requirements.          Pt directed to treatment room, started vitals at 1:12 PM, 130/84, pulse 76. Pt did take Zofran 4 mg and Meclizine 25 mg prior to arriving at office. Instructed patient to blow her nose if needed then recline back to a 45 degree angle if that was more comfortable for her. Gave patient first dose 28 mg nasal spray, administered in each nostril as directed and observed by nurse, waited 5 more minutes for the second dose. After both doses given pt did not complain of any nausea/vomiting, pt did have a drink to help with the taste of Spravato it gives the metallic taste. Checked on pt and she reports feeling okay so far, her 40 minute check was at 1:50 PM, Vital signs were 134/91, pulse 78. Pulse ox 96%. Pt reports doing fine, no complaints voiced  Explained she would be monitored for a total time of 120 minutes.  Discharge vitals were taken at 3:05 PM  136/90, P 71, Pulse Ox 96%. Pt met with Rene Kocher to discuss her treatment, it was decided we would increase her next treatment to 84 mg next week. I will update [facility]. Recommend she go home and sleep or just relax on the couch. No driving, no intense activities. Verbalized understanding. Pt did much better today as well as her 2nd treatment, taking the Meclizine and Zofran an hour prior to her arrival helped. Pt. will be receiving 2 treatments per week for 4 weeks as recommended. Nurse was with pt a total of 60 minutes for clinical assessment. Pt is scheduled next Tuesday, November 19th. Pt instructed to call office with any problems or questions prior to next treatment.      LOT [id number] FEB 2027

## 2023-07-10 ENCOUNTER — Ambulatory Visit: Payer: Commercial Managed Care - PPO

## 2023-07-10 ENCOUNTER — Other Ambulatory Visit: Payer: Self-pay

## 2023-07-10 ENCOUNTER — Encounter: Payer: Self-pay | Admitting: Adult Health

## 2023-07-10 ENCOUNTER — Ambulatory Visit: Payer: Commercial Managed Care - PPO | Admitting: Adult Health

## 2023-07-10 VITALS — BP 135/88 | HR 79

## 2023-07-10 DIAGNOSIS — F339 Major depressive disorder, recurrent, unspecified: Secondary | ICD-10-CM

## 2023-07-10 MED ORDER — SPRAVATO (84 MG DOSE) 28 MG/DEVICE NA SOPK
PACK | NASAL | 0 refills | Status: DC
Start: 2023-07-10 — End: 2023-07-18
  Filled 2023-07-10: qty 9, 9d supply, fill #0

## 2023-07-10 NOTE — Progress Notes (Signed)
Specialty Pharmacy Refill Coordination Note  Robin Stevenson is a 47 y.o. female contacted today regarding refills of specialty medication(s) Esketamine Hcl   Patient requested Courier to Provider Office   Delivery date: 07/11/23   Verified address: [facility] ATTN [name] [address] [place], [place] [address]   Medication will be filled on 07/11/23.   Per [name], patient now tolerating 84mg . She will be receiving 84mg  on Thursday 11/21 and next week appointments are on Monday 11/25 and Wednesday 11/27. Will send 3 kits on 11/20 to cover doses before holiday. Gloris [facility] will be in touch on Wednesday 11/27 with plan for following week as their office is closed on Friday 11/29.

## 2023-07-10 NOTE — Progress Notes (Signed)
NURSES NOTE:   Pt arrived for her #5 Spravato Treatment for treatment resistant depression, the starting dose was 56 mg (2 of the 28 mg nasal sprays) pt stayed at that dose for the first 4 treatments and also takes Zofran and Meclizine prior to arrival at office. Pt received 84 mg (3 of the 28 mg nasal sprays) total today for the first time and did also premedicate. [facility] had been advised on her treatments and doses needed. After today's treatment will contact speciality pharmacy again on how to proceed after today. Pt sees [name], NP and she will follow her throughout her treatments and follow ups.  Pt's Spravato is ordered through [facility].  Spravato medication is stored at treatment center per REMS/FDA guidelines. The medication is required to be locked behind two doors per REMS/FDA protocol. Medication is also disposed of properly after each use per regulations. All documentation for REMS is completed and submitted per FDA/REMS requirements.          Pt directed to treatment room, started vitals at 9:00 AM, 130/72, pulse 82. Pt did take Zofran 4 mg and Meclizine 25 mg prior to arriving at office. Instructed patient to blow her nose if needed then recline back to a 45 degree angle if that was more comfortable for her. Gave patient first dose 28 mg nasal spray, administered in each nostril as directed and observed by nurse, waited 5 more minutes for the second and third doses. After all doses given pt did not complain of any nausea/vomiting, pt did have a drink and some mints to help with the taste of Spravato it gives the metallic taste. Checked on pt and she reports feeling okay so far, her 40 minute check was at 9:55 AM, Vital signs were 132/84, pulse 75. Pulse ox 96%. Pt reports doing fine, no complaints voiced  Explained she would be monitored for a total time of 120 minutes.  Discharge vitals were taken at 11:00 AM 135/88, P 79, Pulse Ox 96%.  Pt met with Rene Kocher to discuss her treatment, it was decided we would continue the maintenance dose of 84mg  for the next treatment and there after unless any issues arise. I will update [facility]. Recommend she go home and sleep or just relax on the couch. No driving, no intense activities. Verbalized understanding. Pt reports she didn't feel overly medicated by adding the extra dose, no dissociation noted today. Pt. will be receiving 2 treatments per week for 4 weeks as recommended. Nurse was with pt a total of 60 minutes for clinical assessment. Pt is scheduled Thursday, November 21st. Pt instructed to call office with any problems or questions prior to next treatment.      LOT [id number] FEB 2027

## 2023-07-10 NOTE — Progress Notes (Signed)
Robin Stevenson [id number] March 01, 1976 47 y.o.  Subjective:   Patient ID:  Robin Stevenson is a 47 y.o. (DOB 02/09/1976) female.  Chief Complaint: No chief complaint on file.   HPI Robin Stevenson presents to the office today for follow-up of treatment resistant depression (TRD).  Spravato treatment    Patient was administered Spravato 84 mg intranasally today. Patient was observed by provider throughout [facility] treatment. The patient experienced the typical dissociation which gradually resolved over the 2-hour period of observation. There were no complications. Specifically, the patient did not have any untoward side effects - feeling disconnected from themself, their thoughts, feelings and things around them, dizziness, nausea, feeling sleepy, decreased feeling of sensitivity (numbness) spinning sensation, feeling anxious, lack of energy, increased blood pressure, feeling happy or very excited, or headache. Blood pressures remained within normal ranges at the 40-minute and 2-hour follow-up intervals. By the time the 2-hour observation period was met the patient was alert and oriented and able to exit without assistance. Patient willing to continue Spravato administration for the treatment of resistant depression. See nursing note for further details.       Review of Systems:  Review of Systems  Musculoskeletal:  Negative for gait problem.  Neurological:  Negative for tremors.  Psychiatric/Behavioral:         Please refer to HPI    Medications: I have reviewed the patient's current medications.  Current Outpatient Medications  Medication Sig Dispense Refill   ALPRAZolam (XANAX) 0.5 MG tablet Take 1 tablet at bedtime the night before procedure. Take 1 tablet 1 hour before arrival. Bring third tablet with you to procedure. 3 tablet 0   atorvastatin (LIPITOR) 20 MG tablet Take 1 tablet (20 mg total) by mouth daily. 30 tablet 5   atovaquone-proguanil (MALARONE) 250-100 MG  TABS tablet Take 1 tablet by mouth once daily begin 2 days prearrival,take daily during stay & continue for 7 days posttravel for malaria prevention 24 tablet 0   azithromycin (ZITHROMAX) 500 MG tablet For non bloody diarrhea,take 2 tabs on day 1, if resolved,stop med,If diarrhea persists 1 tab on day 2 & 3. For bloody diarrhea,2 tabs on day 1 & 1 tab on day 2 & 3 4 tablet 0   buPROPion (WELLBUTRIN XL) 150 MG 24 hr tablet   3   buPROPion (WELLBUTRIN XL) 150 MG 24 hr tablet Take 1 tablet (150 mg total) by mouth every morning. 90 tablet 0   cariprazine (VRAYLAR) 1.5 MG capsule Take 1 capsule (1.5 mg total) by mouth daily. 28 capsule 0   celecoxib (CELEBREX) 200 MG capsule Take 1 capsule by mouth single dose as directed. Take 1 hour prior to arrival for surgery 1 capsule 0   Esketamine HCl, 56 MG Dose, (SPRAVATO, 56 MG DOSE,) 28 MG/DEVICE SOPK Dispense X 2 (28mg ) devices, administer on day 1 intranasally as directed 4 each 0   Esketamine HCl, 84 MG Dose, (SPRAVATO, 84 MG DOSE,) 28 MG/DEVICE SOPK Dispense X 3 (28 mg) devices for treatments twice per week administer intranasally 3 each 5   esomeprazole (NEXIUM) 20 MG capsule Take 20 mg by mouth once.     FETZIMA 120 MG CP24   3   fluocinonide ointment (LIDEX) 0.05 % Apply as directed to skin twice a day 30 g 0   gabapentin (NEURONTIN) 300 MG capsule Take 1 capsule (300 mg total) by mouth 3 (three) times daily. Take first dose 1 hour before arrival for surgery. 21 capsule 0  ketoconazole (NIZORAL) 2 % cream Apply a small amount to affected area on left foot/toe once a day 30 g 0   Levomilnacipran HCl ER (FETZIMA) 120 MG CP24 Take 1 capsule by mouth daily. 90 capsule 3   meclizine (ANTIVERT) 25 MG tablet Take 1 tablet (25 mg total) by mouth as directed prior to treatment. 60 tablet 0   metFORMIN (GLUCOPHAGE) 500 MG tablet Take 1 tablet (500 mg total) by mouth daily. 90 tablet 1   metFORMIN (GLUCOPHAGE) 500 MG tablet Take 1 tablet (500 mg total) by mouth 2  (two) times daily. 180 tablet 1   metFORMIN (GLUCOPHAGE-XR) 500 MG 24 hr tablet Take 1 tablet (500 mg total) by mouth daily. Please do not fill if not picked up within 30 days. 30 tablet 0   methocarbamol (ROBAXIN) 500 MG tablet Take 1 tablet (500 mg total) by mouth every 6 (six) hours as needed for muscle spasms. (Patient not taking: Reported on 05/22/2018) 45 tablet 0   ondansetron (ZOFRAN) 4 MG tablet Take 1 tablet (4 mg total) by mouth every 6 (six) hours for nausea. TAKE 4 tablets 15 minutes prior to arrival for surgery 30 tablet 0   ondansetron (ZOFRAN) 4 MG tablet Take 1 tablet  by mouth every 6 hours as needed for nausea 15 tablet 0   ondansetron (ZOFRAN-ODT) 4 MG disintegrating tablet Dissolve 1 tablet (4 mg total) by mouth every 8 (eight) hours as needed for nausea or vomiting. 20 tablet 1   oxyCODONE (OXY IR/ROXICODONE) 5 MG immediate release tablet Take 1-2 tablet by mouth every six to eight hours as needed for Post Op Pain. DO NOT EXCEED  6 tablets in 24 hours. 30 tablet 0   polyethylene glycol (MIRALAX / GLYCOLAX) packet Take 17 g by mouth daily.     polyethylene glycol-electrolytes (NULYTELY) 420 g solution Take 4,000 mLs by mouth Nightly. 4000 mL 0   Semaglutide-Weight Management (WEGOVY) 2.4 MG/0.75ML SOAJ Inject 2.4 mg into the skin once a week for weight loss. 3 mL 0   sulfamethoxazole-trimethoprim (BACTRIM DS) 800-160 MG tablet Take 1 tablet by mouth 2 (two) times daily as directed. Start the day after surgery. 10 tablet 0   SUMAtriptan (IMITREX) 50 MG tablet TAKE 1 TABLET BY MOUTH AT ONSET OF HEADACHE, MAY REPEAT 1 DOSE AFTER 2 HOURS IF NEEDED 10 tablet 5   SUMAtriptan (IMITREX) 50 MG tablet Take 1 tablet by mouth at onset of headache, may repeat same dose once 2 hours later if needed 10 tablet 5   SUMAtriptan (IMITREX) 50 MG tablet Take 1 tablet (50 mg total) by mouth at the onset of headache. May repeat 1 dose after 2 hours if needed. 10 tablet 5   SUMAtriptan (IMITREX) 50 MG  tablet Take 1 tablet (50 mg total) by mouth at onset of headache,may repeat 1 dose after 2 hours if needed, 2 to 3 times a week 9 tablet 6   terbinafine (LAMISIL) 250 MG tablet Take 1 tablet (250 mg total) by mouth daily for Tinea 14 tablet 0   traZODone (DESYREL) 50 MG tablet Take 50 mg by mouth at bedtime.     traZODone (DESYREL) 50 MG tablet TAKE 1 TO 2 TABLETS BY MOUTH AT BEDTIME 180 tablet 1   traZODone (DESYREL) 50 MG tablet Take 1-2 tablets (50-100 mg total) by mouth at bedtime. 180 tablet 1   traZODone (DESYREL) 50 MG tablet Take 1-2 tablets (50-100 mg total) by mouth at bedtime. 180 tablet 2  WEGOVY 0.5 MG/0.5ML SOAJ Inject 0.5 mg into the skin once a week for weight loss. 2 mL 0   WEGOVY 1 MG/0.5ML SOAJ Inject 1 mg into the skin once a week for weight loss 2 mL 0   WEGOVY 2.4 MG/0.75ML SOAJ Inject 2.4 mg into the skin once a week for weight loss. 3 mL 0   WEGOVY 2.4 MG/0.75ML SOAJ Inject 2.4 mg into the skin once a week fro weight loss 3 mL 0   WEGOVY 2.4 MG/0.75ML SOAJ Inject 2.4 mg into the skin once a week for weight loss 3 mL 0   WEGOVY 2.4 MG/0.75ML SOAJ Inject 2.4 mg,sub-q, into the skin once a week for weight loss 3 mL 0   WEGOVY 2.4 MG/0.75ML SOAJ Inject 2.4 mg into the skin once a week for weight loss. 3 mL 0   WEGOVY 2.4 MG/0.75ML SOAJ Inject 2.4 mg into the skin once a week for weight loss. 3 mL 0   WEGOVY 2.4 MG/0.75ML SOAJ Inject 2.4 mg into the skin once a week for weight loss 3 mL 0   No current facility-administered medications for this visit.    Medication Side Effects: None  Allergies:  Allergies  Allergen Reactions   Brewers Yeast Rash    Past Medical History:  Diagnosis Date   Depression    takes Welbutrin and Fetzima   GERD (gastroesophageal reflux disease)    takes nexium    Past Medical History, Surgical history, Social history, and Family history were reviewed and updated as appropriate.   Please see review of systems for further details on the  patient's review from today.   Objective:   Physical Exam:  There were no vitals taken for this visit.  Physical Exam Constitutional:      General: She is not in acute distress. Musculoskeletal:        General: No deformity.  Neurological:     Mental Status: She is alert and oriented to person, place, and time.     Coordination: Coordination normal.  Psychiatric:        Attention and Perception: Attention and perception normal. She does not perceive auditory or visual hallucinations.        Mood and Affect: Mood is depressed. Affect is not labile, blunt, angry or inappropriate.        Speech: Speech normal.        Behavior: Behavior normal.        Thought Content: Thought content normal. Thought content is not paranoid or delusional. Thought content does not include homicidal or suicidal ideation. Thought content does not include homicidal or suicidal plan.        Cognition and Memory: Cognition and memory normal.        Judgment: Judgment normal.     Comments: Insight intact     Lab Review:     Component Value Date/Time   NA 140 09/01/2016 0852   K 4.4 09/01/2016 0852   CL 104 09/01/2016 0852   CO2 31 09/01/2016 0852   GLUCOSE 90 09/01/2016 0852   BUN 14 09/01/2016 0852   CREATININE 0.86 09/01/2016 0852   CALCIUM 9.2 09/01/2016 0852   PROT 7.0 09/01/2016 0852   ALBUMIN 4.3 09/01/2016 0852   AST 33 09/01/2016 0852   ALT 70 (H) 09/01/2016 0852   ALKPHOS 66 09/01/2016 0852   BILITOT 0.7 09/01/2016 0852       Component Value Date/Time   WBC 5.1 09/01/2016 0852   RBC 4.30 09/01/2016  0852   HGB 13.5 09/01/2016 0852   HCT 38.9 09/01/2016 0852   PLT 286.0 09/01/2016 0852   MCV 90.4 09/01/2016 0852   MCHC 34.6 09/01/2016 0852   RDW 11.9 09/01/2016 0852   LYMPHSABS 1.5 09/01/2016 0852   MONOABS 0.4 09/01/2016 0852   EOSABS 0.1 09/01/2016 0852   BASOSABS 0.0 09/01/2016 0852    No results found for: "POCLITH", "LITHIUM"   No results found for: "PHENYTOIN",  "PHENOBARB", "VALPROATE", "CBMZ"   .res Assessment: Plan:    Recommendations/Plan   Continue Spravato treatment.   Patient advised to contact office with any questions, adverse effects, or acute worsening in signs and symptoms.  Diagnoses and all orders for this visit:  Recurrent major depression resistant to treatment [facility])     Please see After Visit Summary for patient specific instructions.  Future Appointments  Date Time Provider Department Center  07/12/2023  9:00 AM CP-NURSE CP-[facility] None  07/12/2023  9:00 AM Gulianna Hornsby, Thereasa Solo, NP CP-CP None  10/08/2023  1:50 PM [facility]    No orders of the defined types were placed in this encounter.   -------------------------------

## 2023-07-11 ENCOUNTER — Other Ambulatory Visit (HOSPITAL_COMMUNITY): Payer: Self-pay

## 2023-07-11 ENCOUNTER — Other Ambulatory Visit: Payer: Self-pay

## 2023-07-11 NOTE — Progress Notes (Signed)
Spravato prescription filled 07/11/23 to be same-day couriered and signed upon delivery. Spravato REMS completed. Activation Code: [email or web address].

## 2023-07-12 ENCOUNTER — Ambulatory Visit (INDEPENDENT_AMBULATORY_CARE_PROVIDER_SITE_OTHER): Payer: Commercial Managed Care - PPO | Admitting: Adult Health

## 2023-07-12 ENCOUNTER — Ambulatory Visit: Payer: Commercial Managed Care - PPO

## 2023-07-12 ENCOUNTER — Encounter: Payer: Self-pay | Admitting: Adult Health

## 2023-07-12 VITALS — BP 129/84 | HR 76

## 2023-07-12 DIAGNOSIS — F339 Major depressive disorder, recurrent, unspecified: Secondary | ICD-10-CM

## 2023-07-12 NOTE — Progress Notes (Signed)
NURSES NOTE:   Pt arrived for her #6 Spravato Treatment for treatment resistant depression, the starting dose was 56 mg (2 of the 28 mg nasal sprays) pt stayed at that dose for the first 4 treatments and also takes Zofran and Meclizine prior to arrival at office. Pt received 84 mg (3 of the 28 mg nasal sprays) total again today and did also premedicate. [facility] had been advised on her treatments and doses needed. Spoke with pt's pharmacy after her treatment on Tuesday and 3 84 mg dose boxes were delivered yesterday. Pt sees [name], NP and she will follow her throughout her treatments and follow ups.  Pt's Spravato is ordered through [facility].  Spravato medication is stored at treatment center per REMS/FDA guidelines. The medication is required to be locked behind two doors per REMS/FDA protocol. Medication is also disposed of properly after each use per regulations. All documentation for REMS is completed and submitted per FDA/REMS requirements.          Pt directed to treatment room, started vitals at 9:00 AM, 132/78, pulse 83. Pt did take Zofran 4 mg and Meclizine 25 mg prior to arriving at office. Instructed patient to blow her nose if needed then recline back to a 45 degree angle if that was more comfortable for her. Gave patient first dose 28 mg nasal spray, administered in each nostril as directed and observed by nurse, waited 5 more minutes for the second and third doses. After all doses given pt did not complain of any nausea/vomiting, pt did have a drink and some mints to help with the taste of Spravato it gives the metallic taste. Checked on pt and she reports feeling okay so far, her 40 minute check was at 9:45 AM, Vital signs were 139/88, pulse 78. Pulse ox 96%. Pt reports doing fine, no complaints voiced  Explained she would be monitored for a total time of 120 minutes.  Discharge vitals were taken at 11:00 AM 135/88, P 79, Pulse Ox 96%.  Pt met with Rene Kocher to discuss her treatment, it was decided we would continue the maintenance dose of 84mg  for the next treatment and there after unless any issues arise. Pt advised to relax the rest of the day. No driving, no intense activities. Verbalized understanding. Pt reports she did fine today, no dissociation noted today. Pt. will be receiving 2 treatments per week for 4 weeks as recommended. Nurse was with pt a total of 60 minutes for clinical assessment. Pt is scheduled next Monday and Wednesday due to the Thanksgiving Holiday.  Pt instructed to call office with any problems or questions prior to next treatment.      LOT [id number] APR 2027

## 2023-07-12 NOTE — Progress Notes (Signed)
Robin Stevenson [id number] [birth year] 47 y.o.  Subjective:   Patient ID:  Robin Stevenson is a 47 y.o. (DOB 1976-03-21) female.  Chief Complaint: No chief complaint on file.   HPI Robin Stevenson presents to the office today for follow-up of treatment resistant depression (TRD).  Spravato treatment    Patient was administered Spravato 84 mg intranasally today. Patient was observed by provider throughout [facility] treatment. The patient experienced the typical dissociation which gradually resolved over the 2-hour period of observation. There were no complications. Specifically, the patient did not have any untoward side effects - feeling disconnected from themself, their thoughts, feelings and things around them, dizziness, nausea, feeling sleepy, decreased feeling of sensitivity (numbness) spinning sensation, feeling anxious, lack of energy, increased blood pressure, feeling happy or very excited, or headache. Blood pressures remained within normal ranges at the 40-minute and 2-hour follow-up intervals. By the time the 2-hour observation period was met the patient was alert and oriented and able to exit without assistance. Patient willing to continue Spravato administration for the treatment of resistant depression. See nursing note for further details.       Review of Systems:  Review of Systems  Musculoskeletal:  Negative for gait problem.  Neurological:  Negative for tremors.  Psychiatric/Behavioral:         Please refer to HPI    Medications: I have reviewed the patient's current medications.  Current Outpatient Medications  Medication Sig Dispense Refill   ALPRAZolam (XANAX) 0.5 MG tablet Take 1 tablet at bedtime the night before procedure. Take 1 tablet 1 hour before arrival. Bring third tablet with you to procedure. 3 tablet 0   atorvastatin (LIPITOR) 20 MG tablet Take 1 tablet (20 mg total) by mouth daily. 30 tablet 5   atovaquone-proguanil (MALARONE) 250-100 MG  TABS tablet Take 1 tablet by mouth once daily begin 2 days prearrival,take daily during stay & continue for 7 days posttravel for malaria prevention 24 tablet 0   azithromycin (ZITHROMAX) 500 MG tablet For non bloody diarrhea,take 2 tabs on day 1, if resolved,stop med,If diarrhea persists 1 tab on day 2 & 3. For bloody diarrhea,2 tabs on day 1 & 1 tab on day 2 & 3 4 tablet 0   buPROPion (WELLBUTRIN XL) 150 MG 24 hr tablet   3   buPROPion (WELLBUTRIN XL) 150 MG 24 hr tablet Take 1 tablet (150 mg total) by mouth every morning. 90 tablet 0   cariprazine (VRAYLAR) 1.5 MG capsule Take 1 capsule (1.5 mg total) by mouth daily. 28 capsule 0   celecoxib (CELEBREX) 200 MG capsule Take 1 capsule by mouth single dose as directed. Take 1 hour prior to arrival for surgery 1 capsule 0   Esketamine HCl, 56 MG Dose, (SPRAVATO, 56 MG DOSE,) 28 MG/DEVICE SOPK Dispense X 2 (28mg ) devices, administer on day 1 intranasally as directed 4 each 0   Esketamine HCl, 84 MG Dose, (SPRAVATO, 84 MG DOSE,) 28 MG/DEVICE SOPK Dispense X 3 (28 mg) devices for treatments twice per week administer intranasally 9 each 0   esomeprazole (NEXIUM) 20 MG capsule Take 20 mg by mouth once.     FETZIMA 120 MG CP24   3   fluocinonide ointment (LIDEX) 0.05 % Apply as directed to skin twice a day 30 g 0   gabapentin (NEURONTIN) 300 MG capsule Take 1 capsule (300 mg total) by mouth 3 (three) times daily. Take first dose 1 hour before arrival for surgery. 21 capsule 0  ketoconazole (NIZORAL) 2 % cream Apply a small amount to affected area on left foot/toe once a day 30 g 0   Levomilnacipran HCl ER (FETZIMA) 120 MG CP24 Take 1 capsule by mouth daily. 90 capsule 3   meclizine (ANTIVERT) 25 MG tablet Take 1 tablet (25 mg total) by mouth as directed prior to treatment. 60 tablet 0   metFORMIN (GLUCOPHAGE) 500 MG tablet Take 1 tablet (500 mg total) by mouth daily. 90 tablet 1   metFORMIN (GLUCOPHAGE) 500 MG tablet Take 1 tablet (500 mg total) by mouth 2  (two) times daily. 180 tablet 1   metFORMIN (GLUCOPHAGE-XR) 500 MG 24 hr tablet Take 1 tablet (500 mg total) by mouth daily. Please do not fill if not picked up within 30 days. 30 tablet 0   methocarbamol (ROBAXIN) 500 MG tablet Take 1 tablet (500 mg total) by mouth every 6 (six) hours as needed for muscle spasms. (Patient not taking: Reported on 05/22/2018) 45 tablet 0   ondansetron (ZOFRAN) 4 MG tablet Take 1 tablet (4 mg total) by mouth every 6 (six) hours for nausea. TAKE 4 tablets 15 minutes prior to arrival for surgery 30 tablet 0   ondansetron (ZOFRAN) 4 MG tablet Take 1 tablet  by mouth every 6 hours as needed for nausea 15 tablet 0   ondansetron (ZOFRAN-ODT) 4 MG disintegrating tablet Dissolve 1 tablet (4 mg total) by mouth every 8 (eight) hours as needed for nausea or vomiting. 20 tablet 1   oxyCODONE (OXY IR/ROXICODONE) 5 MG immediate release tablet Take 1-2 tablet by mouth every six to eight hours as needed for Post Op Pain. DO NOT EXCEED  6 tablets in 24 hours. 30 tablet 0   polyethylene glycol (MIRALAX / GLYCOLAX) packet Take 17 g by mouth daily.     polyethylene glycol-electrolytes (NULYTELY) 420 g solution Take 4,000 mLs by mouth Nightly. 4000 mL 0   Semaglutide-Weight Management (WEGOVY) 2.4 MG/0.75ML SOAJ Inject 2.4 mg into the skin once a week for weight loss. 3 mL 0   sulfamethoxazole-trimethoprim (BACTRIM DS) 800-160 MG tablet Take 1 tablet by mouth 2 (two) times daily as directed. Start the day after surgery. 10 tablet 0   SUMAtriptan (IMITREX) 50 MG tablet TAKE 1 TABLET BY MOUTH AT ONSET OF HEADACHE, MAY REPEAT 1 DOSE AFTER 2 HOURS IF NEEDED 10 tablet 5   SUMAtriptan (IMITREX) 50 MG tablet Take 1 tablet by mouth at onset of headache, may repeat same dose once 2 hours later if needed 10 tablet 5   SUMAtriptan (IMITREX) 50 MG tablet Take 1 tablet (50 mg total) by mouth at the onset of headache. May repeat 1 dose after 2 hours if needed. 10 tablet 5   SUMAtriptan (IMITREX) 50 MG  tablet Take 1 tablet (50 mg total) by mouth at onset of headache,may repeat 1 dose after 2 hours if needed, 2 to 3 times a week 9 tablet 6   terbinafine (LAMISIL) 250 MG tablet Take 1 tablet (250 mg total) by mouth daily for Tinea 14 tablet 0   traZODone (DESYREL) 50 MG tablet Take 50 mg by mouth at bedtime.     traZODone (DESYREL) 50 MG tablet TAKE 1 TO 2 TABLETS BY MOUTH AT BEDTIME 180 tablet 1   traZODone (DESYREL) 50 MG tablet Take 1-2 tablets (50-100 mg total) by mouth at bedtime. 180 tablet 1   traZODone (DESYREL) 50 MG tablet Take 1-2 tablets (50-100 mg total) by mouth at bedtime. 180 tablet 2  WEGOVY 0.5 MG/0.5ML SOAJ Inject 0.5 mg into the skin once a week for weight loss. 2 mL 0   WEGOVY 1 MG/0.5ML SOAJ Inject 1 mg into the skin once a week for weight loss 2 mL 0   WEGOVY 2.4 MG/0.75ML SOAJ Inject 2.4 mg into the skin once a week for weight loss. 3 mL 0   WEGOVY 2.4 MG/0.75ML SOAJ Inject 2.4 mg into the skin once a week fro weight loss 3 mL 0   WEGOVY 2.4 MG/0.75ML SOAJ Inject 2.4 mg into the skin once a week for weight loss 3 mL 0   WEGOVY 2.4 MG/0.75ML SOAJ Inject 2.4 mg,sub-q, into the skin once a week for weight loss 3 mL 0   WEGOVY 2.4 MG/0.75ML SOAJ Inject 2.4 mg into the skin once a week for weight loss. 3 mL 0   WEGOVY 2.4 MG/0.75ML SOAJ Inject 2.4 mg into the skin once a week for weight loss. 3 mL 0   WEGOVY 2.4 MG/0.75ML SOAJ Inject 2.4 mg into the skin once a week for weight loss 3 mL 0   No current facility-administered medications for this visit.    Medication Side Effects: None  Allergies:  Allergies  Allergen Reactions   Brewers Yeast Rash    Past Medical History:  Diagnosis Date   Depression    takes Welbutrin and Fetzima   GERD (gastroesophageal reflux disease)    takes nexium    Past Medical History, Surgical history, Social history, and Family history were reviewed and updated as appropriate.   Please see review of systems for further details on the  patient's review from today.   Objective:   Physical Exam:  There were no vitals taken for this visit.  Physical Exam Constitutional:      General: She is not in acute distress. Musculoskeletal:        General: No deformity.  Neurological:     Mental Status: She is alert and oriented to person, place, and time.     Coordination: Coordination normal.  Psychiatric:        Attention and Perception: Attention and perception normal. She does not perceive auditory or visual hallucinations.        Mood and Affect: Affect is not labile, blunt, angry or inappropriate.        Speech: Speech normal.        Behavior: Behavior normal.        Thought Content: Thought content normal. Thought content is not paranoid or delusional. Thought content does not include homicidal or suicidal ideation. Thought content does not include homicidal or suicidal plan.        Cognition and Memory: Cognition and memory normal.        Judgment: Judgment normal.     Comments: Insight intact     Lab Review:     Component Value Date/Time   NA 140 09/01/2016 0852   K 4.4 09/01/2016 0852   CL 104 09/01/2016 0852   CO2 31 09/01/2016 0852   GLUCOSE 90 09/01/2016 0852   BUN 14 09/01/2016 0852   CREATININE 0.86 09/01/2016 0852   CALCIUM 9.2 09/01/2016 0852   PROT 7.0 09/01/2016 0852   ALBUMIN 4.3 09/01/2016 0852   AST 33 09/01/2016 0852   ALT 70 (H) 09/01/2016 0852   ALKPHOS 66 09/01/2016 0852   BILITOT 0.7 09/01/2016 0852       Component Value Date/Time   WBC 5.1 09/01/2016 0852   RBC 4.30 09/01/2016 4098  HGB 13.5 09/01/2016 0852   HCT 38.9 09/01/2016 0852   PLT 286.0 09/01/2016 0852   MCV 90.4 09/01/2016 0852   MCHC 34.6 09/01/2016 0852   RDW 11.9 09/01/2016 0852   LYMPHSABS 1.5 09/01/2016 0852   MONOABS 0.4 09/01/2016 0852   EOSABS 0.1 09/01/2016 0852   BASOSABS 0.0 09/01/2016 0852    No results found for: "POCLITH", "LITHIUM"   No results found for: "PHENYTOIN", "PHENOBARB", "VALPROATE",  "CBMZ"   .res Assessment: Plan:    Recommendations/Plan   Continue Spravato treatment.   Patient advised to contact office with any questions, adverse effects, or acute worsening in signs and symptoms.  There are no diagnoses linked to this encounter.   Please see After Visit Summary for patient specific instructions.  Future Appointments  Date Time Provider Department Center  07/16/2023  9:00 AM CP-NURSE CP-CP None  07/16/2023  9:00 AM Julyana Woolverton, Thereasa Solo, NP CP-CP None  07/18/2023 10:00 AM CP-NURSE CP-CP None  07/18/2023 10:00 AM Prosperity Darrough, Thereasa Solo, NP CP-CP None  10/08/2023  1:50 PM [facility] DI[facility] TOMO 2 [facility]    No orders of the defined types were placed in this encounter.   -------------------------------

## 2023-07-13 ENCOUNTER — Other Ambulatory Visit (HOSPITAL_COMMUNITY): Payer: Self-pay

## 2023-07-16 ENCOUNTER — Encounter: Payer: Self-pay | Admitting: Adult Health

## 2023-07-16 ENCOUNTER — Ambulatory Visit: Payer: Commercial Managed Care - PPO | Admitting: Adult Health

## 2023-07-16 ENCOUNTER — Other Ambulatory Visit (HOSPITAL_COMMUNITY): Payer: Self-pay

## 2023-07-16 ENCOUNTER — Ambulatory Visit: Payer: Commercial Managed Care - PPO

## 2023-07-16 VITALS — BP 133/72 | HR 69

## 2023-07-16 DIAGNOSIS — F339 Major depressive disorder, recurrent, unspecified: Secondary | ICD-10-CM

## 2023-07-16 MED ORDER — ATORVASTATIN CALCIUM 20 MG PO TABS
20.0000 mg | ORAL_TABLET | Freq: Every day | ORAL | 3 refills | Status: DC
  Filled 2023-07-16: qty 90, 90d supply, fill #0
  Filled 2023-10-25: qty 90, 90d supply, fill #1
  Filled 2024-01-22: qty 90, 90d supply, fill #2
  Filled 2024-04-22: qty 90, 90d supply, fill #3

## 2023-07-16 NOTE — Progress Notes (Signed)
Robin Stevenson [id number] 07-26-1976 47 y.o.  Subjective:   Patient ID:  Robin Stevenson is a 47 y.o. (DOB 1975/12/06) female.  Chief Complaint: No chief complaint on file.   HPI Robin Stevenson presents to the office today for follow-up of treatment resistant depression (TRD).  Spravato treatment    Patient was administered Spravato 84 mg intranasally today. Patient was observed by provider throughout [facility] treatment. The patient experienced the typical dissociation which gradually resolved over the 2-hour period of observation. There were no complications. Specifically, the patient did not have any untoward side effects - feeling disconnected from themself, their thoughts, feelings and things around them, dizziness, nausea, feeling sleepy, decreased feeling of sensitivity (numbness) spinning sensation, feeling anxious, lack of energy, increased blood pressure, feeling happy or very excited, or headache. Blood pressures remained within normal ranges at the 40-minute and 2-hour follow-up intervals. By the time the 2-hour observation period was met the patient was alert and oriented and able to exit without assistance. Patient willing to continue Spravato administration for the treatment of resistant depression. See nursing note for further details.       Review of Systems:  Review of Systems  Musculoskeletal:  Negative for gait problem.  Neurological:  Negative for tremors.  Psychiatric/Behavioral:         Please refer to HPI    Medications: I have reviewed the patient's current medications.  Current Outpatient Medications  Medication Sig Dispense Refill   ALPRAZolam (XANAX) 0.5 MG tablet Take 1 tablet at bedtime the night before procedure. Take 1 tablet 1 hour before arrival. Bring third tablet with you to procedure. 3 tablet 0   atorvastatin (LIPITOR) 20 MG tablet Take 1 tablet (20 mg total) by mouth daily. 90 tablet 3   atovaquone-proguanil (MALARONE) 250-100 MG  TABS tablet Take 1 tablet by mouth once daily begin 2 days prearrival,take daily during stay & continue for 7 days posttravel for malaria prevention 24 tablet 0   azithromycin (ZITHROMAX) 500 MG tablet For non bloody diarrhea,take 2 tabs on day 1, if resolved,stop med,If diarrhea persists 1 tab on day 2 & 3. For bloody diarrhea,2 tabs on day 1 & 1 tab on day 2 & 3 4 tablet 0   buPROPion (WELLBUTRIN XL) 150 MG 24 hr tablet   3   buPROPion (WELLBUTRIN XL) 150 MG 24 hr tablet Take 1 tablet (150 mg total) by mouth every morning. 90 tablet 0   cariprazine (VRAYLAR) 1.5 MG capsule Take 1 capsule (1.5 mg total) by mouth daily. 28 capsule 0   celecoxib (CELEBREX) 200 MG capsule Take 1 capsule by mouth single dose as directed. Take 1 hour prior to arrival for surgery 1 capsule 0   Esketamine HCl, 56 MG Dose, (SPRAVATO, 56 MG DOSE,) 28 MG/DEVICE SOPK Dispense X 2 (28mg ) devices, administer on day 1 intranasally as directed 4 each 0   Esketamine HCl, 84 MG Dose, (SPRAVATO, 84 MG DOSE,) 28 MG/DEVICE SOPK Dispense X 3 (28 mg) devices for treatments twice per week administer intranasally 9 each 0   esomeprazole (NEXIUM) 20 MG capsule Take 20 mg by mouth once.     FETZIMA 120 MG CP24   3   fluocinonide ointment (LIDEX) 0.05 % Apply as directed to skin twice a day 30 g 0   gabapentin (NEURONTIN) 300 MG capsule Take 1 capsule (300 mg total) by mouth 3 (three) times daily. Take first dose 1 hour before arrival for surgery. 21 capsule 0  ketoconazole (NIZORAL) 2 % cream Apply a small amount to affected area on left foot/toe once a day 30 g 0   Levomilnacipran HCl ER (FETZIMA) 120 MG CP24 Take 1 capsule by mouth daily. 90 capsule 3   meclizine (ANTIVERT) 25 MG tablet Take 1 tablet (25 mg total) by mouth as directed prior to treatment. 60 tablet 0   metFORMIN (GLUCOPHAGE) 500 MG tablet Take 1 tablet (500 mg total) by mouth daily. 90 tablet 1   metFORMIN (GLUCOPHAGE) 500 MG tablet Take 1 tablet (500 mg total) by mouth 2  (two) times daily. 180 tablet 1   metFORMIN (GLUCOPHAGE-XR) 500 MG 24 hr tablet Take 1 tablet (500 mg total) by mouth daily. Please do not fill if not picked up within 30 days. 30 tablet 0   methocarbamol (ROBAXIN) 500 MG tablet Take 1 tablet (500 mg total) by mouth every 6 (six) hours as needed for muscle spasms. (Patient not taking: Reported on 05/22/2018) 45 tablet 0   ondansetron (ZOFRAN) 4 MG tablet Take 1 tablet (4 mg total) by mouth every 6 (six) hours for nausea. TAKE 4 tablets 15 minutes prior to arrival for surgery 30 tablet 0   ondansetron (ZOFRAN) 4 MG tablet Take 1 tablet  by mouth every 6 hours as needed for nausea 15 tablet 0   ondansetron (ZOFRAN-ODT) 4 MG disintegrating tablet Dissolve 1 tablet (4 mg total) by mouth every 8 (eight) hours as needed for nausea or vomiting. 20 tablet 1   oxyCODONE (OXY IR/ROXICODONE) 5 MG immediate release tablet Take 1-2 tablet by mouth every six to eight hours as needed for Post Op Pain. DO NOT EXCEED  6 tablets in 24 hours. 30 tablet 0   polyethylene glycol (MIRALAX / GLYCOLAX) packet Take 17 g by mouth daily.     polyethylene glycol-electrolytes (NULYTELY) 420 g solution Take 4,000 mLs by mouth Nightly. 4000 mL 0   Semaglutide-Weight Management (WEGOVY) 2.4 MG/0.75ML SOAJ Inject 2.4 mg into the skin once a week for weight loss. 3 mL 0   sulfamethoxazole-trimethoprim (BACTRIM DS) 800-160 MG tablet Take 1 tablet by mouth 2 (two) times daily as directed. Start the day after surgery. 10 tablet 0   SUMAtriptan (IMITREX) 50 MG tablet TAKE 1 TABLET BY MOUTH AT ONSET OF HEADACHE, MAY REPEAT 1 DOSE AFTER 2 HOURS IF NEEDED 10 tablet 5   SUMAtriptan (IMITREX) 50 MG tablet Take 1 tablet by mouth at onset of headache, may repeat same dose once 2 hours later if needed 10 tablet 5   SUMAtriptan (IMITREX) 50 MG tablet Take 1 tablet (50 mg total) by mouth at the onset of headache. May repeat 1 dose after 2 hours if needed. 10 tablet 5   SUMAtriptan (IMITREX) 50 MG  tablet Take 1 tablet (50 mg total) by mouth at onset of headache,may repeat 1 dose after 2 hours if needed, 2 to 3 times a week 9 tablet 6   terbinafine (LAMISIL) 250 MG tablet Take 1 tablet (250 mg total) by mouth daily for Tinea 14 tablet 0   traZODone (DESYREL) 50 MG tablet Take 50 mg by mouth at bedtime.     traZODone (DESYREL) 50 MG tablet TAKE 1 TO 2 TABLETS BY MOUTH AT BEDTIME 180 tablet 1   traZODone (DESYREL) 50 MG tablet Take 1-2 tablets (50-100 mg total) by mouth at bedtime. 180 tablet 1   traZODone (DESYREL) 50 MG tablet Take 1-2 tablets (50-100 mg total) by mouth at bedtime. 180 tablet 2  WEGOVY 0.5 MG/0.5ML SOAJ Inject 0.5 mg into the skin once a week for weight loss. 2 mL 0   WEGOVY 1 MG/0.5ML SOAJ Inject 1 mg into the skin once a week for weight loss 2 mL 0   WEGOVY 2.4 MG/0.75ML SOAJ Inject 2.4 mg into the skin once a week for weight loss. 3 mL 0   WEGOVY 2.4 MG/0.75ML SOAJ Inject 2.4 mg into the skin once a week fro weight loss 3 mL 0   WEGOVY 2.4 MG/0.75ML SOAJ Inject 2.4 mg into the skin once a week for weight loss 3 mL 0   WEGOVY 2.4 MG/0.75ML SOAJ Inject 2.4 mg,sub-q, into the skin once a week for weight loss 3 mL 0   WEGOVY 2.4 MG/0.75ML SOAJ Inject 2.4 mg into the skin once a week for weight loss. 3 mL 0   WEGOVY 2.4 MG/0.75ML SOAJ Inject 2.4 mg into the skin once a week for weight loss. 3 mL 0   WEGOVY 2.4 MG/0.75ML SOAJ Inject 2.4 mg into the skin once a week for weight loss 3 mL 0   No current facility-administered medications for this visit.    Medication Side Effects: None  Allergies:  Allergies  Allergen Reactions   Brewers Yeast Rash    Past Medical History:  Diagnosis Date   Depression    takes Welbutrin and Fetzima   GERD (gastroesophageal reflux disease)    takes nexium    Past Medical History, Surgical history, Social history, and Family history were reviewed and updated as appropriate.   Please see review of systems for further details on the  patient's review from today.   Objective:   Physical Exam:  There were no vitals taken for this visit.  Physical Exam Constitutional:      General: She is not in acute distress. Musculoskeletal:        General: No deformity.  Neurological:     Mental Status: She is alert and oriented to person, place, and time.     Coordination: Coordination normal.  Psychiatric:        Attention and Perception: Attention and perception normal. She does not perceive auditory or visual hallucinations.        Mood and Affect: Affect is not labile, blunt, angry or inappropriate.        Speech: Speech normal.        Behavior: Behavior normal.        Thought Content: Thought content normal. Thought content is not paranoid or delusional. Thought content does not include homicidal or suicidal ideation. Thought content does not include homicidal or suicidal plan.        Cognition and Memory: Cognition and memory normal.        Judgment: Judgment normal.     Comments: Insight intact     Lab Review:     Component Value Date/Time   NA 140 09/01/2016 0852   K 4.4 09/01/2016 0852   CL 104 09/01/2016 0852   CO2 31 09/01/2016 0852   GLUCOSE 90 09/01/2016 0852   BUN 14 09/01/2016 0852   CREATININE 0.86 09/01/2016 0852   CALCIUM 9.2 09/01/2016 0852   PROT 7.0 09/01/2016 0852   ALBUMIN 4.3 09/01/2016 0852   AST 33 09/01/2016 0852   ALT 70 (H) 09/01/2016 0852   ALKPHOS 66 09/01/2016 0852   BILITOT 0.7 09/01/2016 0852       Component Value Date/Time   WBC 5.1 09/01/2016 0852   RBC 4.30 09/01/2016 3664  HGB 13.5 09/01/2016 0852   HCT 38.9 09/01/2016 0852   PLT 286.0 09/01/2016 0852   MCV 90.4 09/01/2016 0852   MCHC 34.6 09/01/2016 0852   RDW 11.9 09/01/2016 0852   LYMPHSABS 1.5 09/01/2016 0852   MONOABS 0.4 09/01/2016 0852   EOSABS 0.1 09/01/2016 0852   BASOSABS 0.0 09/01/2016 0852    No results found for: "POCLITH", "LITHIUM"   No results found for: "PHENYTOIN", "PHENOBARB", "VALPROATE",  "CBMZ"   .res Assessment: Plan:    Recommendations/Plan   Continue Spravato treatment.   Patient advised to contact office with any questions, adverse effects, or acute worsening in signs and symptoms. Diagnoses and all orders for this visit:  Recurrent major depression resistant to treatment [facility])     Please see After Visit Summary for patient specific instructions.  Future Appointments  Date Time Provider Department Center  07/18/2023 10:00 AM CP-NURSE [facility] None  07/18/2023 10:00 AM Braylee Lal, Thereasa Solo, NP CP-[facility] None  10/08/2023  1:50 PM [facility]    No orders of the defined types were placed in this encounter.   -------------------------------

## 2023-07-16 NOTE — Progress Notes (Signed)
NURSES NOTE:   Pt arrived for her #7 Spravato Treatment for treatment resistant depression, the starting dose was 56 mg (2 of the 28 mg nasal sprays) pt stayed at that dose for the first 4 treatments and also takes Zofran and Meclizine prior to arrival at office. Pt received 84 mg (3 of the 28 mg nasal sprays) total again today and did also premedicate. [facility] had been advised on her treatments and doses needed. Spoke with pt's pharmacy after her treatment on Tuesday and 3 84 mg dose boxes were delivered yesterday. Pt sees [name], NP and she will follow her throughout her treatments and follow ups.  Pt's Spravato is ordered through [facility].  Spravato medication is stored at treatment center per REMS/FDA guidelines. The medication is required to be locked behind two doors per REMS/FDA protocol. Medication is also disposed of properly after each use per regulations. All documentation for REMS is completed and submitted per FDA/REMS requirements.          Pt directed to treatment room, started vitals at 9:012 AM, 137/78, pulse 74. Pt did take Zofran 4 mg and Meclizine 25 mg prior to arriving at office. Instructed patient to blow her nose if needed then recline back to a 45 degree angle if that was more comfortable for her. Gave patient first dose 28 mg nasal spray, administered in each nostril as directed and observed by nurse, waited 5 more minutes for the second and third doses. After all doses given pt did not complain of any nausea/vomiting, pt did have a drink and some mints to help with the taste of Spravato it gives the metallic taste. Checked on pt and she reports feeling okay so far, her 40 minute check was at 10:05 AM, Vital signs were 127/80, pulse 71. Pulse ox 96%. Pt reports doing fine, no complaints voiced  Explained she would be monitored for a total time of 120 minutes.  Discharge vitals were taken at 11:06 AM 133/72, P 69, Pulse Ox 96%.  Pt met with Rene Kocher to discuss her treatment, it was decided we would continue the maintenance dose of 84mg  for the next treatment and there after unless any issues arise. Pt advised to relax the rest of the day. No driving, no intense activities. Verbalized understanding. Pt reports she did fine today, no dissociation noted today. Pt. will be receiving 2 treatments per week for 4 weeks as recommended. Nurse was with pt a total of 60 minutes for clinical assessment. Pt is scheduled again on Wednesday due to the Thanksgiving Holiday.  Pt instructed to call office with any problems or questions prior to next treatment.      LOT [id number] APR 2027

## 2023-07-17 ENCOUNTER — Other Ambulatory Visit: Payer: Self-pay

## 2023-07-18 ENCOUNTER — Other Ambulatory Visit: Payer: Self-pay

## 2023-07-18 ENCOUNTER — Encounter: Payer: Self-pay | Admitting: Adult Health

## 2023-07-18 ENCOUNTER — Ambulatory Visit: Payer: Commercial Managed Care - PPO | Admitting: Adult Health

## 2023-07-18 ENCOUNTER — Ambulatory Visit: Payer: Commercial Managed Care - PPO

## 2023-07-18 VITALS — BP 130/82 | HR 71

## 2023-07-18 DIAGNOSIS — F339 Major depressive disorder, recurrent, unspecified: Secondary | ICD-10-CM

## 2023-07-18 MED ORDER — SPRAVATO (84 MG DOSE) 28 MG/DEVICE NA SOPK
PACK | NASAL | 0 refills | Status: DC
Start: 2023-07-18 — End: 2023-07-27
  Filled 2023-07-18: qty 6, 7d supply, fill #0

## 2023-07-18 NOTE — Progress Notes (Signed)
Robin Stevenson [id number] 11/04/1975 47 y.o.  Subjective:   Patient ID:  Robin Stevenson is a 47 y.o. (DOB [birth year]) female.  Chief Complaint: No chief complaint on file.   HPI Robin Stevenson presents to the office today for follow-up of treatment resistant depression (TRD).  Spravato treatment    Patient was administered Spravato 84 mg intranasally today. Patient was observed by provider throughout [facility] treatment. The patient experienced the typical dissociation which gradually resolved over the 2-hour period of observation. There were no complications. Specifically, the patient did not have any untoward side effects - feeling disconnected from themself, their thoughts, feelings and things around them, dizziness, nausea, feeling sleepy, decreased feeling of sensitivity (numbness) spinning sensation, feeling anxious, lack of energy, increased blood pressure, feeling happy or very excited, or headache. Blood pressures remained within normal ranges at the 40-minute and 2-hour follow-up intervals. By the time the 2-hour observation period was met the patient was alert and oriented and able to exit without assistance. Patient willing to continue Spravato administration for the treatment of resistant depression. See nursing note for further details.       Review of Systems:  Review of Systems  Musculoskeletal:  Negative for gait problem.  Neurological:  Negative for tremors.  Psychiatric/Behavioral:         Please refer to HPI    Medications: I have reviewed the patient's current medications.  Current Outpatient Medications  Medication Sig Dispense Refill   ALPRAZolam (XANAX) 0.5 MG tablet Take 1 tablet at bedtime the night before procedure. Take 1 tablet 1 hour before arrival. Bring third tablet with you to procedure. 3 tablet 0   atorvastatin (LIPITOR) 20 MG tablet Take 1 tablet (20 mg total) by mouth daily. 90 tablet 3   atovaquone-proguanil (MALARONE) 250-100 MG  TABS tablet Take 1 tablet by mouth once daily begin 2 days prearrival,take daily during stay & continue for 7 days posttravel for malaria prevention 24 tablet 0   azithromycin (ZITHROMAX) 500 MG tablet For non bloody diarrhea,take 2 tabs on day 1, if resolved,stop med,If diarrhea persists 1 tab on day 2 & 3. For bloody diarrhea,2 tabs on day 1 & 1 tab on day 2 & 3 4 tablet 0   buPROPion (WELLBUTRIN XL) 150 MG 24 hr tablet   3   buPROPion (WELLBUTRIN XL) 150 MG 24 hr tablet Take 1 tablet (150 mg total) by mouth every morning. 90 tablet 0   cariprazine (VRAYLAR) 1.5 MG capsule Take 1 capsule (1.5 mg total) by mouth daily. 28 capsule 0   celecoxib (CELEBREX) 200 MG capsule Take 1 capsule by mouth single dose as directed. Take 1 hour prior to arrival for surgery 1 capsule 0   Esketamine HCl, 56 MG Dose, (SPRAVATO, 56 MG DOSE,) 28 MG/DEVICE SOPK Dispense X 2 (28mg ) devices, administer on day 1 intranasally as directed 4 each 0   Esketamine HCl, 84 MG Dose, (SPRAVATO, 84 MG DOSE,) 28 MG/DEVICE SOPK Dispense X 3 (28 mg) devices for treatments twice per week administer intranasally 9 each 0   esomeprazole (NEXIUM) 20 MG capsule Take 20 mg by mouth once.     FETZIMA 120 MG CP24   3   fluocinonide ointment (LIDEX) 0.05 % Apply as directed to skin twice a day 30 g 0   gabapentin (NEURONTIN) 300 MG capsule Take 1 capsule (300 mg total) by mouth 3 (three) times daily. Take first dose 1 hour before arrival for surgery. 21 capsule 0  ketoconazole (NIZORAL) 2 % cream Apply a small amount to affected area on left foot/toe once a day 30 g 0   Levomilnacipran HCl ER (FETZIMA) 120 MG CP24 Take 1 capsule by mouth daily. 90 capsule 3   meclizine (ANTIVERT) 25 MG tablet Take 1 tablet (25 mg total) by mouth as directed prior to treatment. 60 tablet 0   metFORMIN (GLUCOPHAGE) 500 MG tablet Take 1 tablet (500 mg total) by mouth daily. 90 tablet 1   metFORMIN (GLUCOPHAGE) 500 MG tablet Take 1 tablet (500 mg total) by mouth 2  (two) times daily. 180 tablet 1   metFORMIN (GLUCOPHAGE-XR) 500 MG 24 hr tablet Take 1 tablet (500 mg total) by mouth daily. Please do not fill if not picked up within 30 days. 30 tablet 0   methocarbamol (ROBAXIN) 500 MG tablet Take 1 tablet (500 mg total) by mouth every 6 (six) hours as needed for muscle spasms. (Patient not taking: Reported on 05/22/2018) 45 tablet 0   ondansetron (ZOFRAN) 4 MG tablet Take 1 tablet (4 mg total) by mouth every 6 (six) hours for nausea. TAKE 4 tablets 15 minutes prior to arrival for surgery 30 tablet 0   ondansetron (ZOFRAN) 4 MG tablet Take 1 tablet  by mouth every 6 hours as needed for nausea 15 tablet 0   ondansetron (ZOFRAN-ODT) 4 MG disintegrating tablet Dissolve 1 tablet (4 mg total) by mouth every 8 (eight) hours as needed for nausea or vomiting. 20 tablet 1   oxyCODONE (OXY IR/ROXICODONE) 5 MG immediate release tablet Take 1-2 tablet by mouth every six to eight hours as needed for Post Op Pain. DO NOT EXCEED  6 tablets in 24 hours. 30 tablet 0   polyethylene glycol (MIRALAX / GLYCOLAX) packet Take 17 g by mouth daily.     polyethylene glycol-electrolytes (NULYTELY) 420 g solution Take 4,000 mLs by mouth Nightly. 4000 mL 0   Semaglutide-Weight Management (WEGOVY) 2.4 MG/0.75ML SOAJ Inject 2.4 mg into the skin once a week for weight loss. 3 mL 0   sulfamethoxazole-trimethoprim (BACTRIM DS) 800-160 MG tablet Take 1 tablet by mouth 2 (two) times daily as directed. Start the day after surgery. 10 tablet 0   SUMAtriptan (IMITREX) 50 MG tablet TAKE 1 TABLET BY MOUTH AT ONSET OF HEADACHE, MAY REPEAT 1 DOSE AFTER 2 HOURS IF NEEDED 10 tablet 5   SUMAtriptan (IMITREX) 50 MG tablet Take 1 tablet by mouth at onset of headache, may repeat same dose once 2 hours later if needed 10 tablet 5   SUMAtriptan (IMITREX) 50 MG tablet Take 1 tablet (50 mg total) by mouth at the onset of headache. May repeat 1 dose after 2 hours if needed. 10 tablet 5   SUMAtriptan (IMITREX) 50 MG  tablet Take 1 tablet (50 mg total) by mouth at onset of headache,may repeat 1 dose after 2 hours if needed, 2 to 3 times a week 9 tablet 6   terbinafine (LAMISIL) 250 MG tablet Take 1 tablet (250 mg total) by mouth daily for Tinea 14 tablet 0   traZODone (DESYREL) 50 MG tablet Take 50 mg by mouth at bedtime.     traZODone (DESYREL) 50 MG tablet TAKE 1 TO 2 TABLETS BY MOUTH AT BEDTIME 180 tablet 1   traZODone (DESYREL) 50 MG tablet Take 1-2 tablets (50-100 mg total) by mouth at bedtime. 180 tablet 1   traZODone (DESYREL) 50 MG tablet Take 1-2 tablets (50-100 mg total) by mouth at bedtime. 180 tablet 2  WEGOVY 0.5 MG/0.5ML SOAJ Inject 0.5 mg into the skin once a week for weight loss. 2 mL 0   WEGOVY 1 MG/0.5ML SOAJ Inject 1 mg into the skin once a week for weight loss 2 mL 0   WEGOVY 2.4 MG/0.75ML SOAJ Inject 2.4 mg into the skin once a week for weight loss. 3 mL 0   WEGOVY 2.4 MG/0.75ML SOAJ Inject 2.4 mg into the skin once a week fro weight loss 3 mL 0   WEGOVY 2.4 MG/0.75ML SOAJ Inject 2.4 mg into the skin once a week for weight loss 3 mL 0   WEGOVY 2.4 MG/0.75ML SOAJ Inject 2.4 mg,sub-q, into the skin once a week for weight loss 3 mL 0   WEGOVY 2.4 MG/0.75ML SOAJ Inject 2.4 mg into the skin once a week for weight loss. 3 mL 0   WEGOVY 2.4 MG/0.75ML SOAJ Inject 2.4 mg into the skin once a week for weight loss. 3 mL 0   WEGOVY 2.4 MG/0.75ML SOAJ Inject 2.4 mg into the skin once a week for weight loss 3 mL 0   No current facility-administered medications for this visit.    Medication Side Effects: None  Allergies:  Allergies  Allergen Reactions   Brewers Yeast Rash    Past Medical History:  Diagnosis Date   Depression    takes Welbutrin and Fetzima   GERD (gastroesophageal reflux disease)    takes nexium    Past Medical History, Surgical history, Social history, and Family history were reviewed and updated as appropriate.   Please see review of systems for further details on the  patient's review from today.   Objective:   Physical Exam:  There were no vitals taken for this visit.  Physical Exam Constitutional:      General: She is not in acute distress. Musculoskeletal:        General: No deformity.  Neurological:     Mental Status: She is alert and oriented to person, place, and time.     Coordination: Coordination normal.  Psychiatric:        Attention and Perception: Attention and perception normal. She does not perceive auditory or visual hallucinations.        Mood and Affect: Affect is not labile, blunt, angry or inappropriate.        Speech: Speech normal.        Behavior: Behavior normal.        Thought Content: Thought content normal. Thought content is not paranoid or delusional. Thought content does not include homicidal or suicidal ideation. Thought content does not include homicidal or suicidal plan.        Cognition and Memory: Cognition and memory normal.        Judgment: Judgment normal.     Comments: Insight intact     Lab Review:     Component Value Date/Time   NA 140 09/01/2016 0852   K 4.4 09/01/2016 0852   CL 104 09/01/2016 0852   CO2 31 09/01/2016 0852   GLUCOSE 90 09/01/2016 0852   BUN 14 09/01/2016 0852   CREATININE 0.86 09/01/2016 0852   CALCIUM 9.2 09/01/2016 0852   PROT 7.0 09/01/2016 0852   ALBUMIN 4.3 09/01/2016 0852   AST 33 09/01/2016 0852   ALT 70 (H) 09/01/2016 0852   ALKPHOS 66 09/01/2016 0852   BILITOT 0.7 09/01/2016 0852       Component Value Date/Time   WBC 5.1 09/01/2016 0852   RBC 4.30 09/01/2016 8295  HGB 13.5 09/01/2016 0852   HCT 38.9 09/01/2016 0852   PLT 286.0 09/01/2016 0852   MCV 90.4 09/01/2016 0852   MCHC 34.6 09/01/2016 0852   RDW 11.9 09/01/2016 0852   LYMPHSABS 1.5 09/01/2016 0852   MONOABS 0.4 09/01/2016 0852   EOSABS 0.1 09/01/2016 0852   BASOSABS 0.0 09/01/2016 0852    No results found for: "POCLITH", "LITHIUM"   No results found for: "PHENYTOIN", "PHENOBARB", "VALPROATE",  "CBMZ"   .res Assessment: Plan:    Recommendations/Plan   Continue Spravato treatment.   Patient advised to contact office with any questions, adverse effects, or acute worsening in signs and symptoms.  There are no diagnoses linked to this encounter.   Please see After Visit Summary for patient specific instructions.  Future Appointments  Date Time Provider Department Center  10/08/2023  1:50 PM [facility]    No orders of the defined types were placed in this encounter.   -------------------------------

## 2023-07-18 NOTE — Progress Notes (Signed)
NURSES NOTE:   Pt arrived for her #8 Spravato Treatment for treatment resistant depression, the starting dose was 56 mg (2 of the 28 mg nasal sprays) pt stayed at that dose for the first 4 treatments and also takes Zofran and Meclizine prior to arrival at office. Pt received 84 mg (3 of the 28 mg nasal sprays) total again today and did also premedicate. [facility] had been advised on her treatments and doses needed. Pt sees [name], NP and she will follow her throughout her treatments and follow ups.  Pt's Spravato is ordered through [facility].  Spravato medication is stored at treatment center per REMS/FDA guidelines. The medication is required to be locked behind two doors per REMS/FDA protocol. Medication is also disposed of properly after each use per regulations. All documentation for REMS is completed and submitted per FDA/REMS requirements.          Pt directed to treatment room, started vitals at 10:08 AM, 133/74, pulse 82. Pt did take Zofran 4 mg and Meclizine 25 mg prior to arriving at office. Instructed patient to blow her nose if needed then recline back to a 45 degree angle if that was more comfortable for her. Gave patient first dose 28 mg nasal spray, administered in each nostril as directed and observed by nurse, waited 5 more minutes for the second and third doses. After all doses given pt did not complain of any nausea/vomiting, pt did have a drink and some mints to help with the taste of Spravato it gives the metallic taste. Checked on pt and she was sitting up in the chair with the trash can next to her and reports she started vomiting. She reports getting really hot and took off her blanket and jacket and then got sick, asked if she wanted more Zofran but she thinks it's passed now and will be okay. Pt reports thinking about her parents and worried about them then suddenly this occurred. Her 40 minute check was at 10:55 AM, Vital signs  were 137/88, pulse 71. Pulse ox 96%. Checked on pt again and she reports she is doing better. [name], NP was notified of her getting sick.  Pt aware she would be monitored for a total time of 120 minutes.  Discharge vitals were taken at 12:00 PM 130/82, P 71, Pulse Ox 96%. Pt met with Rene Kocher to discuss her treatment, it was decided we would continue the maintenance dose of 84mg  for the next treatment and there after unless any issues arise. Pt advised to relax the rest of the day. No driving, no intense activities. Verbalized understanding. Pt reports she did fine today, no dissociation noted today. Pt. will be receiving 2 treatments per week for 4 weeks as recommended. Nurse was with pt a total of 60 minutes for clinical assessment. Pt is scheduled again next Tuesday and Wednesday, the first week of December.  Pt instructed to call office with any problems or questions prior to next treatment.      LOT [id number] APR 2027

## 2023-07-18 NOTE — Progress Notes (Signed)
Specialty Pharmacy Refill Coordination Note  Robin Stevenson is a 46 y.o. female who's nurse, Gloris Manchester was contacted today regarding refills of specialty medication(s) Esketamine Hcl   Patient requested Courier to Provider Office   Delivery date: 07/23/23   Verified address: [facility]   ATTN [name]  [address]   Medication will be filled on 07/23/23.    Per Gloris Manchester, Patient completed Spravato 84mg  on 11/21, 11/25, & 11/27 with no complications. [name] reports patient premedicates with Zofran & Meclizine. [name] reported patient did vomit on 11/27, however, feels it was unrelated to Spravato. Patient reported feeling hot prior to getting sick.   Patient has appointments for Spravato 84mg  on Tuesday, 12/3 and Thursday, 12/5. Spravato prescription written o n 07/18/23 to complete 7 day therapy. Spravato will be couriered same day from [facility] Pharm[facility] on Monday, 07/23/23 with Signature Required. Gloris Manchester will call pharmacy on Thursday 12/5 to report how patient's therapy is going to plan for the following week.   Refill Coordination Outreach pended for 12/5 for follow up with Specialty Pharmacy.

## 2023-07-20 ENCOUNTER — Other Ambulatory Visit: Payer: Self-pay

## 2023-07-21 ENCOUNTER — Other Ambulatory Visit (HOSPITAL_COMMUNITY): Payer: Self-pay

## 2023-07-23 ENCOUNTER — Other Ambulatory Visit: Payer: Self-pay

## 2023-07-23 NOTE — Progress Notes (Signed)
07/23/23 REMS Authorization code: [id number]

## 2023-07-24 ENCOUNTER — Ambulatory Visit: Payer: Commercial Managed Care - PPO

## 2023-07-24 ENCOUNTER — Ambulatory Visit: Payer: Commercial Managed Care - PPO | Admitting: Adult Health

## 2023-07-24 ENCOUNTER — Encounter: Payer: Self-pay | Admitting: Adult Health

## 2023-07-24 VITALS — BP 137/81 | HR 71

## 2023-07-24 DIAGNOSIS — F339 Major depressive disorder, recurrent, unspecified: Secondary | ICD-10-CM

## 2023-07-24 NOTE — Progress Notes (Signed)
Robin Stevenson [id number] 01-20-1976 47 y.o.  Subjective:   Patient ID:  Robin Stevenson is a 46 y.o. (DOB 07-21-1976) female.  Chief Complaint: No chief complaint on file.   HPI Robin Stevenson presents to the office today for follow-up of treatment resistant depression (TRD).  Spravato treatment    Patient was administered Spravato 84 mg intranasally today. Patient was observed by provider throughout [facility] treatment. The patient experienced the typical dissociation which gradually resolved over the 2-hour period of observation. There were no complications. Specifically, the patient did not have any untoward side effects - feeling disconnected from themself, their thoughts, feelings and things around them, dizziness, nausea, feeling sleepy, decreased feeling of sensitivity (numbness) spinning sensation, feeling anxious, lack of energy, increased blood pressure, feeling happy or very excited, or headache. Blood pressures remained within normal ranges at the 40-minute and 2-hour follow-up intervals. By the time the 2-hour observation period was met the patient was alert and oriented and able to exit without assistance. Patient willing to continue Spravato administration for the treatment of resistant depression. See nursing note for further details.        Review of Systems:  Review of Systems  Musculoskeletal:  Negative for gait problem.  Neurological:  Negative for tremors.  Psychiatric/Behavioral:         Please refer to HPI    Medications: I have reviewed the patient's current medications.  Current Outpatient Medications  Medication Sig Dispense Refill   ALPRAZolam (XANAX) 0.5 MG tablet Take 1 tablet at bedtime the night before procedure. Take 1 tablet 1 hour before arrival. Bring third tablet with you to procedure. 3 tablet 0   atorvastatin (LIPITOR) 20 MG tablet Take 1 tablet (20 mg total) by mouth daily. 90 tablet 3   atovaquone-proguanil (MALARONE) 250-100 MG  TABS tablet Take 1 tablet by mouth once daily begin 2 days prearrival,take daily during stay & continue for 7 days posttravel for malaria prevention 24 tablet 0   azithromycin (ZITHROMAX) 500 MG tablet For non bloody diarrhea,take 2 tabs on day 1, if resolved,stop med,If diarrhea persists 1 tab on day 2 & 3. For bloody diarrhea,2 tabs on day 1 & 1 tab on day 2 & 3 4 tablet 0   buPROPion (WELLBUTRIN XL) 150 MG 24 hr tablet   3   buPROPion (WELLBUTRIN XL) 150 MG 24 hr tablet Take 1 tablet (150 mg total) by mouth every morning. 90 tablet 0   cariprazine (VRAYLAR) 1.5 MG capsule Take 1 capsule (1.5 mg total) by mouth daily. 28 capsule 0   celecoxib (CELEBREX) 200 MG capsule Take 1 capsule by mouth single dose as directed. Take 1 hour prior to arrival for surgery 1 capsule 0   Esketamine HCl, 56 MG Dose, (SPRAVATO, 56 MG DOSE,) 28 MG/DEVICE SOPK Dispense X 2 (28mg ) devices, administer on day 1 intranasally as directed 4 each 0   Esketamine HCl, 84 MG Dose, (SPRAVATO, 84 MG DOSE,) 28 MG/DEVICE SOPK Dispense X 3 (28 mg) devices for treatments twice per week administer intranasally 6 each 0   esomeprazole (NEXIUM) 20 MG capsule Take 20 mg by mouth once.     FETZIMA 120 MG CP24   3   fluocinonide ointment (LIDEX) 0.05 % Apply as directed to skin twice a day 30 g 0   gabapentin (NEURONTIN) 300 MG capsule Take 1 capsule (300 mg total) by mouth 3 (three) times daily. Take first dose 1 hour before arrival for surgery. 21 capsule 0  ketoconazole (NIZORAL) 2 % cream Apply a small amount to affected area on left foot/toe once a day 30 g 0   Levomilnacipran HCl ER (FETZIMA) 120 MG CP24 Take 1 capsule by mouth daily. 90 capsule 3   meclizine (ANTIVERT) 25 MG tablet Take 1 tablet (25 mg total) by mouth as directed prior to treatment. 60 tablet 0   metFORMIN (GLUCOPHAGE) 500 MG tablet Take 1 tablet (500 mg total) by mouth daily. 90 tablet 1   metFORMIN (GLUCOPHAGE) 500 MG tablet Take 1 tablet (500 mg total) by mouth 2  (two) times daily. 180 tablet 1   metFORMIN (GLUCOPHAGE-XR) 500 MG 24 hr tablet Take 1 tablet (500 mg total) by mouth daily. Please do not fill if not picked up within 30 days. 30 tablet 0   methocarbamol (ROBAXIN) 500 MG tablet Take 1 tablet (500 mg total) by mouth every 6 (six) hours as needed for muscle spasms. (Patient not taking: Reported on 05/22/2018) 45 tablet 0   ondansetron (ZOFRAN) 4 MG tablet Take 1 tablet (4 mg total) by mouth every 6 (six) hours for nausea. TAKE 4 tablets 15 minutes prior to arrival for surgery 30 tablet 0   ondansetron (ZOFRAN) 4 MG tablet Take 1 tablet  by mouth every 6 hours as needed for nausea 15 tablet 0   ondansetron (ZOFRAN-ODT) 4 MG disintegrating tablet Dissolve 1 tablet (4 mg total) by mouth every 8 (eight) hours as needed for nausea or vomiting. 20 tablet 1   oxyCODONE (OXY IR/ROXICODONE) 5 MG immediate release tablet Take 1-2 tablet by mouth every six to eight hours as needed for Post Op Pain. DO NOT EXCEED  6 tablets in 24 hours. 30 tablet 0   polyethylene glycol (MIRALAX / GLYCOLAX) packet Take 17 g by mouth daily.     polyethylene glycol-electrolytes (NULYTELY) 420 g solution Take 4,000 mLs by mouth Nightly. 4000 mL 0   Semaglutide-Weight Management (WEGOVY) 2.4 MG/0.75ML SOAJ Inject 2.4 mg into the skin once a week for weight loss. 3 mL 0   sulfamethoxazole-trimethoprim (BACTRIM DS) 800-160 MG tablet Take 1 tablet by mouth 2 (two) times daily as directed. Start the day after surgery. 10 tablet 0   SUMAtriptan (IMITREX) 50 MG tablet TAKE 1 TABLET BY MOUTH AT ONSET OF HEADACHE, MAY REPEAT 1 DOSE AFTER 2 HOURS IF NEEDED 10 tablet 5   SUMAtriptan (IMITREX) 50 MG tablet Take 1 tablet by mouth at onset of headache, may repeat same dose once 2 hours later if needed 10 tablet 5   SUMAtriptan (IMITREX) 50 MG tablet Take 1 tablet (50 mg total) by mouth at the onset of headache. May repeat 1 dose after 2 hours if needed. 10 tablet 5   SUMAtriptan (IMITREX) 50 MG  tablet Take 1 tablet (50 mg total) by mouth at onset of headache,may repeat 1 dose after 2 hours if needed, 2 to 3 times a week 9 tablet 6   terbinafine (LAMISIL) 250 MG tablet Take 1 tablet (250 mg total) by mouth daily for Tinea 14 tablet 0   traZODone (DESYREL) 50 MG tablet Take 50 mg by mouth at bedtime.     traZODone (DESYREL) 50 MG tablet TAKE 1 TO 2 TABLETS BY MOUTH AT BEDTIME 180 tablet 1   traZODone (DESYREL) 50 MG tablet Take 1-2 tablets (50-100 mg total) by mouth at bedtime. 180 tablet 1   traZODone (DESYREL) 50 MG tablet Take 1-2 tablets (50-100 mg total) by mouth at bedtime. 180 tablet 2  WEGOVY 0.5 MG/0.5ML SOAJ Inject 0.5 mg into the skin once a week for weight loss. 2 mL 0   WEGOVY 1 MG/0.5ML SOAJ Inject 1 mg into the skin once a week for weight loss 2 mL 0   WEGOVY 2.4 MG/0.75ML SOAJ Inject 2.4 mg into the skin once a week for weight loss. 3 mL 0   WEGOVY 2.4 MG/0.75ML SOAJ Inject 2.4 mg into the skin once a week fro weight loss 3 mL 0   WEGOVY 2.4 MG/0.75ML SOAJ Inject 2.4 mg into the skin once a week for weight loss 3 mL 0   WEGOVY 2.4 MG/0.75ML SOAJ Inject 2.4 mg,sub-q, into the skin once a week for weight loss 3 mL 0   WEGOVY 2.4 MG/0.75ML SOAJ Inject 2.4 mg into the skin once a week for weight loss. 3 mL 0   WEGOVY 2.4 MG/0.75ML SOAJ Inject 2.4 mg into the skin once a week for weight loss. 3 mL 0   WEGOVY 2.4 MG/0.75ML SOAJ Inject 2.4 mg into the skin once a week for weight loss 3 mL 0   No current facility-administered medications for this visit.    Medication Side Effects: None  Allergies:  Allergies  Allergen Reactions   Brewers Yeast Rash    Past Medical History:  Diagnosis Date   Depression    takes Welbutrin and Fetzima   GERD (gastroesophageal reflux disease)    takes nexium    Past Medical History, Surgical history, Social history, and Family history were reviewed and updated as appropriate.   Please see review of systems for further details on the  patient's review from today.   Objective:   Physical Exam:  There were no vitals taken for this visit.  Physical Exam Constitutional:      General: She is not in acute distress. Musculoskeletal:        General: No deformity.  Neurological:     Mental Status: She is alert and oriented to person, place, and time.     Coordination: Coordination normal.  Psychiatric:        Attention and Perception: Attention and perception normal. She does not perceive auditory or visual hallucinations.        Mood and Affect: Mood is depressed. Affect is not labile, blunt, angry or inappropriate.        Speech: Speech normal.        Behavior: Behavior normal.        Thought Content: Thought content normal. Thought content is not paranoid or delusional. Thought content does not include homicidal or suicidal ideation. Thought content does not include homicidal or suicidal plan.        Cognition and Memory: Cognition and memory normal.        Judgment: Judgment normal.     Comments: Insight intact     Lab Review:     Component Value Date/Time   NA 140 09/01/2016 0852   K 4.4 09/01/2016 0852   CL 104 09/01/2016 0852   CO2 31 09/01/2016 0852   GLUCOSE 90 09/01/2016 0852   BUN 14 09/01/2016 0852   CREATININE 0.86 09/01/2016 0852   CALCIUM 9.2 09/01/2016 0852   PROT 7.0 09/01/2016 0852   ALBUMIN 4.3 09/01/2016 0852   AST 33 09/01/2016 0852   ALT 70 (H) 09/01/2016 0852   ALKPHOS 66 09/01/2016 0852   BILITOT 0.7 09/01/2016 0852       Component Value Date/Time   WBC 5.1 09/01/2016 0852   RBC 4.30 09/01/2016  0852   HGB 13.5 09/01/2016 0852   HCT 38.9 09/01/2016 0852   PLT 286.0 09/01/2016 0852   MCV 90.4 09/01/2016 0852   MCHC 34.6 09/01/2016 0852   RDW 11.9 09/01/2016 0852   LYMPHSABS 1.5 09/01/2016 0852   MONOABS 0.4 09/01/2016 0852   EOSABS 0.1 09/01/2016 0852   BASOSABS 0.0 09/01/2016 0852    No results found for: "POCLITH", "LITHIUM"   No results found for: "PHENYTOIN",  "PHENOBARB", "VALPROATE", "CBMZ"   .res Assessment: Plan:    Recommendations/Plan   Continue Spravato treatment.   Patient advised to contact office with any questions, adverse effects, or acute worsening in signs and symptoms.  Diagnoses and all orders for this visit:  Recurrent major depression resistant to treatment [facility])     Please see After Visit Summary for patient specific instructions.  Future Appointments  Date Time Provider Department Center  07/26/2023  1:00 PM CP-NURSE [facility] None  07/26/2023  1:00 PM Lynette Topete, Thereasa Solo, NP CP-CP None  10/08/2023  1:50 PM [facility] DIAG [facility] 2 [facility]    No orders of the defined types were placed in this encounter.   -------------------------------

## 2023-07-24 NOTE — Progress Notes (Signed)
NURSES NOTE:   Pt arrived for her #9 Spravato Treatment for treatment resistant depression, the starting dose was 56 mg (2 of the 28 mg nasal sprays) pt stayed at that dose for the first 4 treatments and also takes Zofran and Meclizine prior to arrival at office. Pt received 84 mg (3 of the 28 mg nasal sprays) total again today and did also premedicate. [facility] had been advised on her treatments and doses needed. Pt sees [name], NP and she will follow her throughout her treatments and follow ups.  Pt's Spravato is ordered through [facility].  Spravato medication is stored at treatment center per REMS/FDA guidelines. The medication is required to be locked behind two doors per REMS/FDA protocol. Medication is also disposed of properly after each use per regulations. All documentation for REMS is completed and submitted per FDA/REMS requirements.          Pt directed to treatment room, started vitals at 9:08 AM, 127/72, pulse 85. Pt did take Zofran 4 mg and Meclizine 25 mg prior to arriving at office. Instructed patient to blow her nose if needed then recline back to a 45 degree angle if that was more comfortable for her. Gave patient first dose 28 mg nasal spray, administered in each nostril as directed and observed by nurse, waited 5 more minutes for the second and third doses. After all doses given pt did not complain of any nausea/vomiting, pt did have a drink and some mints to help with the taste of Spravato it gives the metallic taste. Her 40 minute check was at 9:55 AM, Vital signs were 134/82, pulse 71. Pulse ox 96%. Pt aware she would be monitored for a total time of 120 minutes.  Discharge vitals were taken at 11:00 PM 137/81, P 71, Pulse Ox 96%. Pt met with Rene Kocher to discuss her treatment, it was decided we would continue the maintenance dose of 84mg  for the next treatment and there after unless any issues arise. Pt advised to relax the rest of  the day. No driving, no intense activities. Verbalized understanding. Pt reports she did fine today, no dissociation noted today. Pt. will be receiving 2 treatments per week for 4 weeks as recommended. Nurse was with pt a total of 60 minutes for clinical assessment. Pt is scheduled again this Thursday, December 5th. Pt instructed to call office with any problems or questions prior to next treatment.      LOT [id number] APR 2027

## 2023-07-25 ENCOUNTER — Other Ambulatory Visit (HOSPITAL_COMMUNITY): Payer: Self-pay

## 2023-07-25 MED ORDER — METFORMIN HCL 500 MG PO TABS
500.0000 mg | ORAL_TABLET | Freq: Two times a day (BID) | ORAL | 1 refills | Status: AC
  Filled 2023-07-25: qty 180, 90d supply, fill #0

## 2023-07-26 ENCOUNTER — Ambulatory Visit: Payer: Commercial Managed Care - PPO

## 2023-07-26 ENCOUNTER — Encounter: Payer: Self-pay | Admitting: Adult Health

## 2023-07-26 ENCOUNTER — Ambulatory Visit: Payer: Commercial Managed Care - PPO | Admitting: Adult Health

## 2023-07-26 VITALS — BP 127/86 | HR 98

## 2023-07-26 DIAGNOSIS — F339 Major depressive disorder, recurrent, unspecified: Secondary | ICD-10-CM | POA: Diagnosis not present

## 2023-07-26 NOTE — Progress Notes (Signed)
Robin Stevenson [id number] [birth year] 47 y.o.  Subjective:   Patient ID:  Robin Stevenson is a 47 y.o. (DOB 10-07-1975) female.  Chief Complaint: No chief complaint on file.   HPI [name] presents to the office today for follow-up of treatment resistant depression (TRD).   Spravato treatment    Patient was administered Spravato 84 mg intranasally today. Patient was observed by provider throughout [facility] treatment. The patient experienced the typical dissociation which gradually resolved over the 2-hour period of observation. There were minor untoward symptoms reported to include nausea and vomiting. She did not feel sleepy, decreased feeling of sensitivity (numbness), lack of energy, increased blood pressure, feeling happy or very excited, or headache. She denied feeling disconnected from themself, their thoughts, feelings and things around them. Blood pressures remained within normal ranges at the 40-minute and 2-hour follow-up intervals. By the time the 2-hour observation period was met the patient was alert and oriented and able to exit without assistance. Patient willing to continue Spravato administration for the treatment of resistant depression. See nursing note for further details.        Review of Systems:  Review of Systems  Musculoskeletal:  Negative for gait problem.  Neurological:  Negative for tremors.  Psychiatric/Behavioral:         Please refer to HPI    Medications: I have reviewed the patient's current medications.  Current Outpatient Medications  Medication Sig Dispense Refill   ALPRAZolam (XANAX) 0.5 MG tablet Take 1 tablet at bedtime the night before procedure. Take 1 tablet 1 hour before arrival. Bring third tablet with you to procedure. 3 tablet 0   atorvastatin (LIPITOR) 20 MG tablet Take 1 tablet (20 mg total) by mouth daily. 90 tablet 3   atovaquone-proguanil (MALARONE) 250-100 MG TABS tablet Take 1 tablet by mouth once daily begin 2  days prearrival,take daily during stay & continue for 7 days posttravel for malaria prevention 24 tablet 0   azithromycin (ZITHROMAX) 500 MG tablet For non bloody diarrhea,take 2 tabs on day 1, if resolved,stop med,If diarrhea persists 1 tab on day 2 & 3. For bloody diarrhea,2 tabs on day 1 & 1 tab on day 2 & 3 4 tablet 0   buPROPion (WELLBUTRIN XL) 150 MG 24 hr tablet   3   buPROPion (WELLBUTRIN XL) 150 MG 24 hr tablet Take 1 tablet (150 mg total) by mouth every morning. 90 tablet 0   cariprazine (VRAYLAR) 1.5 MG capsule Take 1 capsule (1.5 mg total) by mouth daily. 28 capsule 0   celecoxib (CELEBREX) 200 MG capsule Take 1 capsule by mouth single dose as directed. Take 1 hour prior to arrival for surgery 1 capsule 0   Esketamine HCl, 56 MG Dose, (SPRAVATO, 56 MG DOSE,) 28 MG/DEVICE SOPK Dispense X 2 (28mg ) devices, administer on day 1 intranasally as directed 4 each 0   Esketamine HCl, 84 MG Dose, (SPRAVATO, 84 MG DOSE,) 28 MG/DEVICE SOPK Dispense X 3 (28 mg) devices for treatments twice per week administer intranasally 6 each 0   esomeprazole (NEXIUM) 20 MG capsule Take 20 mg by mouth once.     FETZIMA 120 MG CP24   3   fluocinonide ointment (LIDEX) 0.05 % Apply as directed to skin twice a day 30 g 0   gabapentin (NEURONTIN) 300 MG capsule Take 1 capsule (300 mg total) by mouth 3 (three) times daily. Take first dose 1 hour before arrival for surgery. 21 capsule 0   ketoconazole (NIZORAL) 2 %  cream Apply a small amount to affected area on left foot/toe once a day 30 g 0   Levomilnacipran HCl ER (FETZIMA) 120 MG CP24 Take 1 capsule by mouth daily. 90 capsule 3   meclizine (ANTIVERT) 25 MG tablet Take 1 tablet (25 mg total) by mouth as directed prior to treatment. 60 tablet 0   metFORMIN (GLUCOPHAGE) 500 MG tablet Take 1 tablet (500 mg total) by mouth daily. 90 tablet 1   metFORMIN (GLUCOPHAGE) 500 MG tablet Take 1 tablet (500 mg total) by mouth 2 (two) times daily. 180 tablet 1   metFORMIN  (GLUCOPHAGE) 500 MG tablet Take 1 tablet (500 mg total) by mouth 2 (two) times daily. 90 tablet 1   metFORMIN (GLUCOPHAGE-XR) 500 MG 24 hr tablet Take 1 tablet (500 mg total) by mouth daily. Please do not fill if not picked up within 30 days. 30 tablet 0   methocarbamol (ROBAXIN) 500 MG tablet Take 1 tablet (500 mg total) by mouth every 6 (six) hours as needed for muscle spasms. (Patient not taking: Reported on 05/22/2018) 45 tablet 0   ondansetron (ZOFRAN) 4 MG tablet Take 1 tablet (4 mg total) by mouth every 6 (six) hours for nausea. TAKE 4 tablets 15 minutes prior to arrival for surgery 30 tablet 0   ondansetron (ZOFRAN) 4 MG tablet Take 1 tablet  by mouth every 6 hours as needed for nausea 15 tablet 0   ondansetron (ZOFRAN-ODT) 4 MG disintegrating tablet Dissolve 1 tablet (4 mg total) by mouth every 8 (eight) hours as needed for nausea or vomiting. 20 tablet 1   oxyCODONE (OXY IR/ROXICODONE) 5 MG immediate release tablet Take 1-2 tablet by mouth every six to eight hours as needed for Post Op Pain. DO NOT EXCEED  6 tablets in 24 hours. 30 tablet 0   polyethylene glycol (MIRALAX / GLYCOLAX) packet Take 17 g by mouth daily.     polyethylene glycol-electrolytes (NULYTELY) 420 g solution Take 4,000 mLs by mouth Nightly. 4000 mL 0   Semaglutide-Weight Management (WEGOVY) 2.4 MG/0.75ML SOAJ Inject 2.4 mg into the skin once a week for weight loss. 3 mL 0   sulfamethoxazole-trimethoprim (BACTRIM DS) 800-160 MG tablet Take 1 tablet by mouth 2 (two) times daily as directed. Start the day after surgery. 10 tablet 0   SUMAtriptan (IMITREX) 50 MG tablet TAKE 1 TABLET BY MOUTH AT ONSET OF HEADACHE, MAY REPEAT 1 DOSE AFTER 2 HOURS IF NEEDED 10 tablet 5   SUMAtriptan (IMITREX) 50 MG tablet Take 1 tablet by mouth at onset of headache, may repeat same dose once 2 hours later if needed 10 tablet 5   SUMAtriptan (IMITREX) 50 MG tablet Take 1 tablet (50 mg total) by mouth at the onset of headache. May repeat 1 dose after  2 hours if needed. 10 tablet 5   SUMAtriptan (IMITREX) 50 MG tablet Take 1 tablet (50 mg total) by mouth at onset of headache,may repeat 1 dose after 2 hours if needed, 2 to 3 times a week 9 tablet 6   terbinafine (LAMISIL) 250 MG tablet Take 1 tablet (250 mg total) by mouth daily for Tinea 14 tablet 0   traZODone (DESYREL) 50 MG tablet Take 50 mg by mouth at bedtime.     traZODone (DESYREL) 50 MG tablet TAKE 1 TO 2 TABLETS BY MOUTH AT BEDTIME 180 tablet 1   traZODone (DESYREL) 50 MG tablet Take 1-2 tablets (50-100 mg total) by mouth at bedtime. 180 tablet 1   traZODone (DESYREL)  50 MG tablet Take 1-2 tablets (50-100 mg total) by mouth at bedtime. 180 tablet 2   WEGOVY 0.5 MG/0.5ML SOAJ Inject 0.5 mg into the skin once a week for weight loss. 2 mL 0   WEGOVY 1 MG/0.5ML SOAJ Inject 1 mg into the skin once a week for weight loss 2 mL 0   WEGOVY 2.4 MG/0.75ML SOAJ Inject 2.4 mg into the skin once a week for weight loss. 3 mL 0   WEGOVY 2.4 MG/0.75ML SOAJ Inject 2.4 mg into the skin once a week fro weight loss 3 mL 0   WEGOVY 2.4 MG/0.75ML SOAJ Inject 2.4 mg into the skin once a week for weight loss 3 mL 0   WEGOVY 2.4 MG/0.75ML SOAJ Inject 2.4 mg,sub-q, into the skin once a week for weight loss 3 mL 0   WEGOVY 2.4 MG/0.75ML SOAJ Inject 2.4 mg into the skin once a week for weight loss. 3 mL 0   WEGOVY 2.4 MG/0.75ML SOAJ Inject 2.4 mg into the skin once a week for weight loss. 3 mL 0   WEGOVY 2.4 MG/0.75ML SOAJ Inject 2.4 mg into the skin once a week for weight loss 3 mL 0   No current facility-administered medications for this visit.    Medication Side Effects: None  Allergies:  Allergies  Allergen Reactions   Brewers Yeast Rash    Past Medical History:  Diagnosis Date   Depression    takes Welbutrin and Fetzima   GERD (gastroesophageal reflux disease)    takes nexium    Past Medical History, Surgical history, Social history, and Family history were reviewed and updated as  appropriate.   Please see review of systems for further details on the patient's review from today.   Objective:   Physical Exam:  There were no vitals taken for this visit.  Physical Exam Constitutional:      General: She is not in acute distress. Musculoskeletal:        General: No deformity.  Neurological:     Mental Status: She is alert and oriented to person, place, and time.     Coordination: Coordination normal.  Psychiatric:        Attention and Perception: Attention and perception normal. She does not perceive auditory or visual hallucinations.        Mood and Affect: Affect is not labile, blunt, angry or inappropriate.        Speech: Speech normal.        Behavior: Behavior normal.        Thought Content: Thought content normal. Thought content is not paranoid or delusional. Thought content does not include homicidal or suicidal ideation. Thought content does not include homicidal or suicidal plan.        Cognition and Memory: Cognition and memory normal.        Judgment: Judgment normal.     Comments: Insight intact     Lab Review:     Component Value Date/Time   NA 140 09/01/2016 0852   K 4.4 09/01/2016 0852   CL 104 09/01/2016 0852   CO2 31 09/01/2016 0852   GLUCOSE 90 09/01/2016 0852   BUN 14 09/01/2016 0852   CREATININE 0.86 09/01/2016 0852   CALCIUM 9.2 09/01/2016 0852   PROT 7.0 09/01/2016 0852   ALBUMIN 4.3 09/01/2016 0852   AST 33 09/01/2016 0852   ALT 70 (H) 09/01/2016 0852   ALKPHOS 66 09/01/2016 0852   BILITOT 0.7 09/01/2016 4010  Component Value Date/Time   WBC 5.1 09/01/2016 0852   RBC 4.30 09/01/2016 0852   HGB 13.5 09/01/2016 0852   HCT 38.9 09/01/2016 0852   PLT 286.0 09/01/2016 0852   MCV 90.4 09/01/2016 0852   MCHC 34.6 09/01/2016 0852   RDW 11.9 09/01/2016 0852   LYMPHSABS 1.5 09/01/2016 0852   MONOABS 0.4 09/01/2016 0852   EOSABS 0.1 09/01/2016 0852   BASOSABS 0.0 09/01/2016 0852    No results found for: "POCLITH",  "LITHIUM"   No results found for: "PHENYTOIN", "PHENOBARB", "VALPROATE", "CBMZ"   .res Assessment: Plan:    Recommendations/Plan   Continue Spravato treatment.  RTC in 5 days.   Patient advised to contact office with any questions, adverse effects, or acute worsening in signs and symptoms.   Diagnoses and all orders for this visit:  Recurrent major depression resistant to treatment [facility])     Please see After Visit Summary for patient specific instructions.  Future Appointments  Date Time Provider Department Center  10/08/2023  1:50 PM [facility]    No orders of the defined types were placed in this encounter.   -------------------------------

## 2023-07-27 ENCOUNTER — Other Ambulatory Visit (HOSPITAL_COMMUNITY): Payer: Self-pay

## 2023-07-27 ENCOUNTER — Other Ambulatory Visit: Payer: Self-pay

## 2023-07-27 MED ORDER — SPRAVATO (84 MG DOSE) 28 MG/DEVICE NA SOPK
PACK | NASAL | 2 refills | Status: DC
Start: 2023-07-27 — End: 2023-08-01
  Filled 2023-07-27: qty 6, 7d supply, fill #0
  Filled 2023-07-27: qty 6, fill #0

## 2023-07-27 NOTE — Progress Notes (Signed)
Specialty Pharmacy Refill Coordination Note  VESTER CRECCO is a 47 y.o. female. [name] was contacted today regarding refills of specialty medication(s) Esketamine Hcl   Patient requested Courier to Provider Office   Delivery date: 07/30/23   Verified address: [facility]   ATTN [name]  [address]   Medication will be filled on 07/30/23. Same Day Courier. Signature Required.   Patient has appointments scheduled on Tuesday, 12/10 and Thursday, 12/12 for Spravato 84mg . Per [name], patient has been vomiting on the last dose of Spravato at the last 2 appointments. Patient is pre-treated with antiemetics before each appointment. [name] feels that this is not a side effect of Spravato but more anxiety/nervousness. [name] administered Xanax to patient at the last appointment, which seemed to help. Patient would still like to continue with 84mg  dose twice weekly for next week.   Pharmacy will follow up with clinic after appointments for next weeks refills.

## 2023-07-28 ENCOUNTER — Other Ambulatory Visit (HOSPITAL_BASED_OUTPATIENT_CLINIC_OR_DEPARTMENT_OTHER): Payer: Self-pay

## 2023-07-30 ENCOUNTER — Other Ambulatory Visit: Payer: Self-pay

## 2023-07-30 ENCOUNTER — Other Ambulatory Visit (HOSPITAL_COMMUNITY): Payer: Self-pay

## 2023-07-31 ENCOUNTER — Other Ambulatory Visit: Payer: Self-pay

## 2023-07-31 ENCOUNTER — Encounter: Payer: Commercial Managed Care - PPO | Admitting: Adult Health

## 2023-07-31 ENCOUNTER — Other Ambulatory Visit (HOSPITAL_COMMUNITY): Payer: Self-pay

## 2023-07-31 NOTE — Progress Notes (Signed)
Noted patient is due to start maintenance dose this week, however, provider and patient would like to continue one more week of twice weekly dosing. Clinic issued PA on 07/30/23 for twice weekly dosing. Appt for 07/31/23 has been cancelled. Next appt is scheduled for Thursday, 08/02/23.   Prescription on file for 2 doses of 84mg  once PA is approved. [name] Scroggins and [employer] both aware.

## 2023-08-01 ENCOUNTER — Other Ambulatory Visit: Payer: Self-pay

## 2023-08-02 ENCOUNTER — Other Ambulatory Visit: Payer: Self-pay

## 2023-08-02 ENCOUNTER — Ambulatory Visit: Payer: Commercial Managed Care - PPO | Admitting: Adult Health

## 2023-08-02 ENCOUNTER — Other Ambulatory Visit (HOSPITAL_COMMUNITY): Payer: Self-pay

## 2023-08-02 ENCOUNTER — Ambulatory Visit: Payer: Commercial Managed Care - PPO

## 2023-08-02 ENCOUNTER — Encounter: Payer: Self-pay | Admitting: Adult Health

## 2023-08-02 VITALS — BP 155/98 | HR 70

## 2023-08-02 DIAGNOSIS — F339 Major depressive disorder, recurrent, unspecified: Secondary | ICD-10-CM

## 2023-08-02 MED ORDER — SPRAVATO (84 MG DOSE) 28 MG/DEVICE NA SOPK
PACK | NASAL | 0 refills | Status: DC
Start: 2023-08-02 — End: 2023-08-03
  Filled 2023-08-02: qty 3, 3d supply, fill #0
  Filled 2023-08-02: qty 3, 7d supply, fill #0

## 2023-08-02 NOTE — Progress Notes (Signed)
EMILCE STADTLER [id number] November 21, 1975 47 y.o.  Subjective:   Patient ID:  Robin Stevenson is a 47 y.o. (DOB 08/10/1976) female.  Chief Complaint: No chief complaint on file.   HPI ELLENORE HAMMONS presents to the office today for follow-up of treatment resistant depression (TRD).   Spravato treatment    Patient was administered Spravato 84 mg intranasally today. Patient was observed by provider throughout [facility] treatment. The patient experienced the typical dissociation which gradually resolved over the 2-hour period of observation. There were minor untoward symptoms reported to include nausea and vomiting. She did not feel sleepy, decreased feeling of sensitivity (numbness), lack of energy, increased blood pressure, feeling happy or very excited, or headache. She denied feeling disconnected from themself, their thoughts, feelings and things around them. Blood pressures remained within normal ranges at the 40-minute and 2-hour follow-up intervals. By the time the 2-hour observation period was met the patient was alert and oriented and able to exit without assistance. Patient willing to continue Spravato administration for the treatment of resistant depression. See nursing note for further details.        Review of Systems:  Review of Systems  Musculoskeletal:  Negative for gait problem.  Neurological:  Negative for tremors.  Psychiatric/Behavioral:         Please refer to HPI    Medications: I have reviewed the patient's current medications.  Current Outpatient Medications  Medication Sig Dispense Refill   ALPRAZolam (XANAX) 0.5 MG tablet Take 1 tablet at bedtime the night before procedure. Take 1 tablet 1 hour before arrival. Bring third tablet with you to procedure. 3 tablet 0   atorvastatin (LIPITOR) 20 MG tablet Take 1 tablet (20 mg total) by mouth daily. 90 tablet 3   atovaquone-proguanil (MALARONE) 250-100 MG TABS tablet Take 1 tablet by mouth once daily begin 2  days prearrival,take daily during stay & continue for 7 days posttravel for malaria prevention 24 tablet 0   azithromycin (ZITHROMAX) 500 MG tablet For non bloody diarrhea,take 2 tabs on day 1, if resolved,stop med,If diarrhea persists 1 tab on day 2 & 3. For bloody diarrhea,2 tabs on day 1 & 1 tab on day 2 & 3 4 tablet 0   buPROPion (WELLBUTRIN XL) 150 MG 24 hr tablet   3   buPROPion (WELLBUTRIN XL) 150 MG 24 hr tablet Take 1 tablet (150 mg total) by mouth every morning. 90 tablet 0   cariprazine (VRAYLAR) 1.5 MG capsule Take 1 capsule (1.5 mg total) by mouth daily. 28 capsule 0   celecoxib (CELEBREX) 200 MG capsule Take 1 capsule by mouth single dose as directed. Take 1 hour prior to arrival for surgery 1 capsule 0   Esketamine HCl, 56 MG Dose, (SPRAVATO, 56 MG DOSE,) 28 MG/DEVICE SOPK Dispense X 2 (28mg ) devices, administer on day 1 intranasally as directed 4 each 0   Esketamine HCl, 84 MG Dose, (SPRAVATO, 84 MG DOSE,) 28 MG/DEVICE SOPK Dispense X 3 (28 mg) devices for treatments once a week administer intranasally 3 each 0   esomeprazole (NEXIUM) 20 MG capsule Take 20 mg by mouth once.     FETZIMA 120 MG CP24   3   fluocinonide ointment (LIDEX) 0.05 % Apply as directed to skin twice a day 30 g 0   gabapentin (NEURONTIN) 300 MG capsule Take 1 capsule (300 mg total) by mouth 3 (three) times daily. Take first dose 1 hour before arrival for surgery. 21 capsule 0   ketoconazole (NIZORAL) 2 %  cream Apply a small amount to affected area on left foot/toe once a day 30 g 0   Levomilnacipran HCl ER (FETZIMA) 120 MG CP24 Take 1 capsule by mouth daily. 90 capsule 3   meclizine (ANTIVERT) 25 MG tablet Take 1 tablet (25 mg total) by mouth as directed prior to treatment. 60 tablet 0   metFORMIN (GLUCOPHAGE) 500 MG tablet Take 1 tablet (500 mg total) by mouth daily. 90 tablet 1   metFORMIN (GLUCOPHAGE) 500 MG tablet Take 1 tablet (500 mg total) by mouth 2 (two) times daily. 180 tablet 1   metFORMIN (GLUCOPHAGE)  500 MG tablet Take 1 tablet (500 mg total) by mouth 2 (two) times daily. 90 tablet 1   metFORMIN (GLUCOPHAGE-XR) 500 MG 24 hr tablet Take 1 tablet (500 mg total) by mouth daily. Please do not fill if not picked up within 30 days. 30 tablet 0   methocarbamol (ROBAXIN) 500 MG tablet Take 1 tablet (500 mg total) by mouth every 6 (six) hours as needed for muscle spasms. (Patient not taking: Reported on 05/22/2018) 45 tablet 0   ondansetron (ZOFRAN) 4 MG tablet Take 1 tablet (4 mg total) by mouth every 6 (six) hours for nausea. TAKE 4 tablets 15 minutes prior to arrival for surgery 30 tablet 0   ondansetron (ZOFRAN) 4 MG tablet Take 1 tablet  by mouth every 6 hours as needed for nausea 15 tablet 0   ondansetron (ZOFRAN-ODT) 4 MG disintegrating tablet Dissolve 1 tablet (4 mg total) by mouth every 8 (eight) hours as needed for nausea or vomiting. 20 tablet 1   oxyCODONE (OXY IR/ROXICODONE) 5 MG immediate release tablet Take 1-2 tablet by mouth every six to eight hours as needed for Post Op Pain. DO NOT EXCEED  6 tablets in 24 hours. 30 tablet 0   polyethylene glycol (MIRALAX / GLYCOLAX) packet Take 17 g by mouth daily.     polyethylene glycol-electrolytes (NULYTELY) 420 g solution Take 4,000 mLs by mouth Nightly. 4000 mL 0   Semaglutide-Weight Management (WEGOVY) 2.4 MG/0.75ML SOAJ Inject 2.4 mg into the skin once a week for weight loss. 3 mL 0   sulfamethoxazole-trimethoprim (BACTRIM DS) 800-160 MG tablet Take 1 tablet by mouth 2 (two) times daily as directed. Start the day after surgery. 10 tablet 0   SUMAtriptan (IMITREX) 50 MG tablet TAKE 1 TABLET BY MOUTH AT ONSET OF HEADACHE, MAY REPEAT 1 DOSE AFTER 2 HOURS IF NEEDED 10 tablet 5   SUMAtriptan (IMITREX) 50 MG tablet Take 1 tablet by mouth at onset of headache, may repeat same dose once 2 hours later if needed 10 tablet 5   SUMAtriptan (IMITREX) 50 MG tablet Take 1 tablet (50 mg total) by mouth at the onset of headache. May repeat 1 dose after 2 hours if  needed. 10 tablet 5   SUMAtriptan (IMITREX) 50 MG tablet Take 1 tablet (50 mg total) by mouth at onset of headache,may repeat 1 dose after 2 hours if needed, 2 to 3 times a week 9 tablet 6   terbinafine (LAMISIL) 250 MG tablet Take 1 tablet (250 mg total) by mouth daily for Tinea 14 tablet 0   traZODone (DESYREL) 50 MG tablet Take 50 mg by mouth at bedtime.     traZODone (DESYREL) 50 MG tablet TAKE 1 TO 2 TABLETS BY MOUTH AT BEDTIME 180 tablet 1   traZODone (DESYREL) 50 MG tablet Take 1-2 tablets (50-100 mg total) by mouth at bedtime. 180 tablet 1   traZODone (DESYREL)  50 MG tablet Take 1-2 tablets (50-100 mg total) by mouth at bedtime. 180 tablet 2   WEGOVY 0.5 MG/0.5ML SOAJ Inject 0.5 mg into the skin once a week for weight loss. 2 mL 0   WEGOVY 1 MG/0.5ML SOAJ Inject 1 mg into the skin once a week for weight loss 2 mL 0   WEGOVY 2.4 MG/0.75ML SOAJ Inject 2.4 mg into the skin once a week for weight loss. 3 mL 0   WEGOVY 2.4 MG/0.75ML SOAJ Inject 2.4 mg into the skin once a week fro weight loss 3 mL 0   WEGOVY 2.4 MG/0.75ML SOAJ Inject 2.4 mg into the skin once a week for weight loss 3 mL 0   WEGOVY 2.4 MG/0.75ML SOAJ Inject 2.4 mg,sub-q, into the skin once a week for weight loss 3 mL 0   WEGOVY 2.4 MG/0.75ML SOAJ Inject 2.4 mg into the skin once a week for weight loss. 3 mL 0   WEGOVY 2.4 MG/0.75ML SOAJ Inject 2.4 mg into the skin once a week for weight loss. 3 mL 0   WEGOVY 2.4 MG/0.75ML SOAJ Inject 2.4 mg into the skin once a week for weight loss 3 mL 0   No current facility-administered medications for this visit.    Medication Side Effects: None  Allergies:  Allergies  Allergen Reactions   Brewers Yeast Rash    Past Medical History:  Diagnosis Date   Depression    takes Welbutrin and Fetzima   GERD (gastroesophageal reflux disease)    takes nexium    Past Medical History, Surgical history, Social history, and Family history were reviewed and updated as appropriate.    Please see review of systems for further details on the patient's review from today.   Objective:   Physical Exam:  There were no vitals taken for this visit.  Physical Exam Constitutional:      General: She is not in acute distress. Musculoskeletal:        General: No deformity.  Neurological:     Mental Status: She is alert and oriented to person, place, and time.     Coordination: Coordination normal.  Psychiatric:        Attention and Perception: Attention and perception normal. She does not perceive auditory or visual hallucinations.        Mood and Affect: Affect is not labile, blunt, angry or inappropriate.        Speech: Speech normal.        Behavior: Behavior normal.        Thought Content: Thought content normal. Thought content is not paranoid or delusional. Thought content does not include homicidal or suicidal ideation. Thought content does not include homicidal or suicidal plan.        Cognition and Memory: Cognition and memory normal.        Judgment: Judgment normal.     Comments: Insight intact     Lab Review:     Component Value Date/Time   NA 140 09/01/2016 0852   K 4.4 09/01/2016 0852   CL 104 09/01/2016 0852   CO2 31 09/01/2016 0852   GLUCOSE 90 09/01/2016 0852   BUN 14 09/01/2016 0852   CREATININE 0.86 09/01/2016 0852   CALCIUM 9.2 09/01/2016 0852   PROT 7.0 09/01/2016 0852   ALBUMIN 4.3 09/01/2016 0852   AST 33 09/01/2016 0852   ALT 70 (H) 09/01/2016 0852   ALKPHOS 66 09/01/2016 0852   BILITOT 0.7 09/01/2016 1324  Component Value Date/Time   WBC 5.1 09/01/2016 0852   RBC 4.30 09/01/2016 0852   HGB 13.5 09/01/2016 0852   HCT 38.9 09/01/2016 0852   PLT 286.0 09/01/2016 0852   MCV 90.4 09/01/2016 0852   MCHC 34.6 09/01/2016 0852   RDW 11.9 09/01/2016 0852   LYMPHSABS 1.5 09/01/2016 0852   MONOABS 0.4 09/01/2016 0852   EOSABS 0.1 09/01/2016 0852   BASOSABS 0.0 09/01/2016 0852    No results found for: "POCLITH", "LITHIUM"    No results found for: "PHENYTOIN", "PHENOBARB", "VALPROATE", "CBMZ"   .res Assessment: Plan:    Recommendations/Plan   Continue Spravato treatment.  RTC in 5 days.   Patient advised to contact office with any questions, adverse effects, or acute worsening in signs and symptoms.   There are no diagnoses linked to this encounter.   Please see After Visit Summary for patient specific instructions.  Future Appointments  Date Time Provider Department Center  10/08/2023  1:50 PM [facility]    No orders of the defined types were placed in this encounter.   -------------------------------

## 2023-08-02 NOTE — Progress Notes (Unsigned)
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 Pt received 84 mg (3 of the 28 mg nasal sprays) total again today and did also premedicate. [facility] had been advised on her treatments and doses needed. Pt sees [name], NP and she will follow her throughout her treatments and follow ups.  Pt's Spravato is ordered through [facility].  Spravato medication is stored at treatment center per REMS/FDA guidelines. The medication is required to be locked behind two doors per REMS/FDA protocol. Medication is also disposed of properly after each use per regulations. All documentation for REMS is completed and submitted per FDA/REMS requirements.          Pt directed to treatment room, started vitals at 1:05 PM, 127/86, pulse 98. Pt did take Zofran 4 mg and Meclizine 25 mg prior to arriving at office but not sure she took it an hour prior like usual. Instructed patient to blow her nose if needed then recline back to a 45 degree angle if that was more comfortable for her. Gave patient first dose 28 mg nasal spray, administered in each nostril as directed and observed by nurse, waited 5 more minutes for the second and third doses. After all doses given pt did not complain of any nausea/vomiting, pt did have a drink and some mints to help with the taste of Spravato it gives the metallic taste. Prior to checking her 40 minute check she became very hot and couldn't how else she was feeling but something was wrong, pt began vomiting into trash can several times. Almira Coaster was aware pt was sick. was at 9:55 AM, Vital signs were 134/82, pulse 71. Pulse ox 96%. Pt aware she would be monitored for a total time of 120 minutes.  Discharge vitals were taken at 11:00 PM  137/81, P 71, Pulse Ox 96%. Pt met with Rene Kocher to discuss her treatment, it was decided we would continue the maintenance dose of 84mg  for the next treatment and there after unless any issues arise. Pt advised to relax the rest of the day. No driving, no intense activities. Verbalized understanding. Pt reports she did fine today, no dissociation noted today. Pt. will be receiving 2 treatments per week for 4 weeks as recommended. Nurse was with pt a total of 60 minutes for clinical assessment. Pt is scheduled again next week. Pt instructed to call office with any problems or questions prior to next treatment.      LOT [id number] APR 2027

## 2023-08-02 NOTE — Progress Notes (Signed)
NURSES NOTE:   Pt arrived for her # 11 Spravato Treatment for treatment resistant depression, the starting dose was 56 mg (2 of the 28 mg nasal sprays) pt stayed at that dose for the first 4 treatments and also takes Zofran and Meclizine prior to arrival at office. Pt received 84 mg (3 of the 28 mg nasal sprays) total again today and did also premedicate. Pt reports last weekend she was also sick so perhaps she had a virus which triggered the vomiting. Rene Kocher would like to extend pt's treatments to twice a week but having difficulty with insurance agreeing. Another prior authorization was initiated for the extended treatments but no response back yet. [facility] had been advised on her treatments and doses needed. Pt sees [name], NP and she will follow her throughout her treatments and follow ups.  Pt's Spravato is ordered through [facility].  Spravato medication is stored at treatment center per REMS/FDA guidelines. The medication is required to be locked behind two doors per REMS/FDA protocol. Medication is also disposed of properly after each use per regulations. All documentation for REMS is completed and submitted per FDA/REMS requirements.          Pt directed to treatment room, started vitals at 1:00 PM, 133/85, pulse 73. Pt did take Zofran 4 mg and Meclizine 25 mg prior to arriving at office, an hour ahead. Instructed patient to blow her nose if needed then recline back to a 45 degree angle if that was more comfortable for her. Gave patient first dose 28 mg nasal spray, administered in each nostril as directed and observed by nurse, waited 5 more minutes for the second and third doses. After all doses given pt did not complain of any nausea/vomiting, pt did have a drink and some mints to help with the taste of Spravato it gives the metallic taste. Pt started getting flush and hot again, I did bring a fan to sit in her room to help. Her 40 minute  check was at 1:50 PM, Vital signs were 155/98, pulse 70. Pulse ox 96%. Pt aware she would be monitored for a total time of 120 minutes.  Discharge vitals were taken at 2:59 PM 135/90, P 73, Pulse Ox 96%. Pt met with Rene Kocher to discuss her treatment, it was decided we would continue the maintenance dose of 84mg  for the next treatment and there after unless any issues arise. Pt advised to relax the rest of the day. No driving, no intense activities. Verbalized understanding. Pt reports she did fine today, no dissociation noted today. Nurse was with pt a total of 60 minutes for clinical assessment. Pt is scheduled Tuesday, December 17th. Pt instructed to call office with any problems or questions prior to next treatment.      LOT [id number] APR 2027

## 2023-08-02 NOTE — Progress Notes (Signed)
Same day courier Spravato on 08/02/23, French Ana at MD's office aware- signature required. REMS authorization code [id number].

## 2023-08-03 ENCOUNTER — Other Ambulatory Visit: Payer: Self-pay

## 2023-08-03 ENCOUNTER — Other Ambulatory Visit (HOSPITAL_COMMUNITY): Payer: Self-pay

## 2023-08-03 ENCOUNTER — Other Ambulatory Visit (HOSPITAL_BASED_OUTPATIENT_CLINIC_OR_DEPARTMENT_OTHER): Payer: Self-pay

## 2023-08-03 MED ORDER — SPRAVATO (84 MG DOSE) 28 MG/DEVICE NA SOPK
PACK | NASAL | 1 refills | Status: DC
Start: 2023-08-03 — End: 2023-08-06
  Filled 2023-08-03: qty 3, fill #0

## 2023-08-06 ENCOUNTER — Other Ambulatory Visit (HOSPITAL_COMMUNITY): Payer: Self-pay

## 2023-08-06 ENCOUNTER — Other Ambulatory Visit: Payer: Self-pay

## 2023-08-06 ENCOUNTER — Telehealth: Payer: Self-pay

## 2023-08-06 MED ORDER — SPRAVATO (84 MG DOSE) 28 MG/DEVICE NA SOPK
PACK | NASAL | 4 refills | Status: DC
Start: 2023-08-06 — End: 2023-09-11
  Filled 2023-08-06: qty 6, 6d supply, fill #0
  Filled 2023-08-09: qty 6, 6d supply, fill #1
  Filled 2023-08-21: qty 6, 6d supply, fill #2
  Filled 2023-08-28: qty 6, 6d supply, fill #3

## 2023-08-06 NOTE — Progress Notes (Signed)
Specialty Pharmacy Refill Coordination Note  Robin Stevenson is a 47 y.o. female contacted today regarding refills of specialty medication(s) Esketamine HCl (Spravato (84 MG Dose))   Patient requested Courier to Provider Office   Delivery date: 08/06/23   Verified address: [facility]   ATTN [name]  [address]   Medication will be filled on 08/06/23.   Per [name], patient approved for additional twice weekly dosing until 09/01/23. Rx being sent today will cover 2 appointments this week 12/17 and 12/19. Will follow up Friday 12/20 to confirm schedule for next week.

## 2023-08-06 NOTE — Telephone Encounter (Signed)
Prior Authorization submitted to extend Spravato 84 mg twice a week for another 4 weeks, originally discussed over the phone with [employer] with a fax response of a denial. Contacted [employer] again to explain further the reasoning behind the extended Spravato 84 mg treatments again and response back was an approval for 4 weeks, effective 08/02/2023-09/01/2023. [facility] notified as well as pt. Will continue twice a week treatments for 4 more weeks. Next treatment Tuesday, December 17th.

## 2023-08-06 NOTE — Progress Notes (Signed)
08/06/23 REMS Authorization Code [id number]

## 2023-08-07 ENCOUNTER — Encounter: Payer: Self-pay | Admitting: Adult Health

## 2023-08-07 ENCOUNTER — Ambulatory Visit: Payer: Commercial Managed Care - PPO | Admitting: Adult Health

## 2023-08-07 ENCOUNTER — Ambulatory Visit: Payer: Commercial Managed Care - PPO

## 2023-08-07 VITALS — BP 127/84 | HR 69

## 2023-08-07 DIAGNOSIS — F339 Major depressive disorder, recurrent, unspecified: Secondary | ICD-10-CM | POA: Diagnosis not present

## 2023-08-07 NOTE — Progress Notes (Signed)
Robin Stevenson [id number] 1976-04-10 47 y.o.  Subjective:   Patient ID:  Robin Stevenson is a 47 y.o. (DOB 11/02/1975) female.  Chief Complaint: No chief complaint on file.   HPI Robin Stevenson presents to the office today for follow-up of  treatment resistant depression (TRD).   Spravato treatment    Patient was administered Spravato 84 mg intranasally today. Patient was observed by provider throughout [facility] treatment. The patient experienced the typical dissociation which gradually resolved over the 2-hour period of observation. There were minor untoward symptoms reported to include nausea and vomiting. She did not feel sleepy, decreased feeling of sensitivity (numbness), lack of energy, increased blood pressure, feeling happy or very excited, or headache. She denied feeling disconnected from themself, their thoughts, feelings and things around them. Blood pressures remained within normal ranges at the 40-minute and 2-hour follow-up intervals. By the time the 2-hour observation period was met the patient was alert and oriented and able to exit without assistance. Patient willing to continue Spravato administration for the treatment of resistant depression. See nursing note for further details.        Review of Systems:  Review of Systems  Musculoskeletal:  Negative for gait problem.  Neurological:  Negative for tremors.  Psychiatric/Behavioral:         Please refer to HPI    Medications: I have reviewed the patient's current medications.  Current Outpatient Medications  Medication Sig Dispense Refill   ALPRAZolam (XANAX) 0.5 MG tablet Take 1 tablet at bedtime the night before procedure. Take 1 tablet 1 hour before arrival. Bring third tablet with you to procedure. 3 tablet 0   atorvastatin (LIPITOR) 20 MG tablet Take 1 tablet (20 mg total) by mouth daily. 90 tablet 3   atovaquone-proguanil (MALARONE) 250-100 MG TABS tablet Take 1 tablet by mouth once daily begin 2  days prearrival,take daily during stay & continue for 7 days posttravel for malaria prevention 24 tablet 0   azithromycin (ZITHROMAX) 500 MG tablet For non bloody diarrhea,take 2 tabs on day 1, if resolved,stop med,If diarrhea persists 1 tab on day 2 & 3. For bloody diarrhea,2 tabs on day 1 & 1 tab on day 2 & 3 4 tablet 0   buPROPion (WELLBUTRIN XL) 150 MG 24 hr tablet   3   buPROPion (WELLBUTRIN XL) 150 MG 24 hr tablet Take 1 tablet (150 mg total) by mouth every morning. 90 tablet 0   cariprazine (VRAYLAR) 1.5 MG capsule Take 1 capsule (1.5 mg total) by mouth daily. 28 capsule 0   celecoxib (CELEBREX) 200 MG capsule Take 1 capsule by mouth single dose as directed. Take 1 hour prior to arrival for surgery 1 capsule 0   Esketamine HCl, 56 MG Dose, (SPRAVATO, 56 MG DOSE,) 28 MG/DEVICE SOPK Dispense X 2 (28mg ) devices, administer on day 1 intranasally as directed 4 each 0   Esketamine HCl, 84 MG Dose, (SPRAVATO, 84 MG DOSE,) 28 MG/DEVICE SOPK Dispense X 3 (28 mg) devices for treatments twice a week administer intranasally 6 each 4   esomeprazole (NEXIUM) 20 MG capsule Take 20 mg by mouth once.     FETZIMA 120 MG CP24   3   fluocinonide ointment (LIDEX) 0.05 % Apply as directed to skin twice a day 30 g 0   gabapentin (NEURONTIN) 300 MG capsule Take 1 capsule (300 mg total) by mouth 3 (three) times daily. Take first dose 1 hour before arrival for surgery. 21 capsule 0   ketoconazole (NIZORAL)  2 % cream Apply a small amount to affected area on left foot/toe once a day 30 g 0   Levomilnacipran HCl ER (FETZIMA) 120 MG CP24 Take 1 capsule by mouth daily. 90 capsule 3   meclizine (ANTIVERT) 25 MG tablet Take 1 tablet (25 mg total) by mouth as directed prior to treatment. 60 tablet 0   metFORMIN (GLUCOPHAGE) 500 MG tablet Take 1 tablet (500 mg total) by mouth daily. 90 tablet 1   metFORMIN (GLUCOPHAGE) 500 MG tablet Take 1 tablet (500 mg total) by mouth 2 (two) times daily. 180 tablet 1   metFORMIN  (GLUCOPHAGE) 500 MG tablet Take 1 tablet (500 mg total) by mouth 2 (two) times daily. 90 tablet 1   metFORMIN (GLUCOPHAGE-XR) 500 MG 24 hr tablet Take 1 tablet (500 mg total) by mouth daily. Please do not fill if not picked up within 30 days. 30 tablet 0   methocarbamol (ROBAXIN) 500 MG tablet Take 1 tablet (500 mg total) by mouth every 6 (six) hours as needed for muscle spasms. (Patient not taking: Reported on 05/22/2018) 45 tablet 0   ondansetron (ZOFRAN) 4 MG tablet Take 1 tablet (4 mg total) by mouth every 6 (six) hours for nausea. TAKE 4 tablets 15 minutes prior to arrival for surgery 30 tablet 0   ondansetron (ZOFRAN) 4 MG tablet Take 1 tablet  by mouth every 6 hours as needed for nausea 15 tablet 0   ondansetron (ZOFRAN-ODT) 4 MG disintegrating tablet Dissolve 1 tablet (4 mg total) by mouth every 8 (eight) hours as needed for nausea or vomiting. 20 tablet 1   oxyCODONE (OXY IR/ROXICODONE) 5 MG immediate release tablet Take 1-2 tablet by mouth every six to eight hours as needed for Post Op Pain. DO NOT EXCEED  6 tablets in 24 hours. 30 tablet 0   polyethylene glycol (MIRALAX / GLYCOLAX) packet Take 17 g by mouth daily.     polyethylene glycol-electrolytes (NULYTELY) 420 g solution Take 4,000 mLs by mouth Nightly. 4000 mL 0   Semaglutide-Weight Management (WEGOVY) 2.4 MG/0.75ML SOAJ Inject 2.4 mg into the skin once a week for weight loss. 3 mL 0   sulfamethoxazole-trimethoprim (BACTRIM DS) 800-160 MG tablet Take 1 tablet by mouth 2 (two) times daily as directed. Start the day after surgery. 10 tablet 0   SUMAtriptan (IMITREX) 50 MG tablet TAKE 1 TABLET BY MOUTH AT ONSET OF HEADACHE, MAY REPEAT 1 DOSE AFTER 2 HOURS IF NEEDED 10 tablet 5   SUMAtriptan (IMITREX) 50 MG tablet Take 1 tablet by mouth at onset of headache, may repeat same dose once 2 hours later if needed 10 tablet 5   SUMAtriptan (IMITREX) 50 MG tablet Take 1 tablet (50 mg total) by mouth at the onset of headache. May repeat 1 dose after  2 hours if needed. 10 tablet 5   SUMAtriptan (IMITREX) 50 MG tablet Take 1 tablet (50 mg total) by mouth at onset of headache,may repeat 1 dose after 2 hours if needed, 2 to 3 times a week 9 tablet 6   terbinafine (LAMISIL) 250 MG tablet Take 1 tablet (250 mg total) by mouth daily for Tinea 14 tablet 0   traZODone (DESYREL) 50 MG tablet Take 50 mg by mouth at bedtime.     traZODone (DESYREL) 50 MG tablet TAKE 1 TO 2 TABLETS BY MOUTH AT BEDTIME 180 tablet 1   traZODone (DESYREL) 50 MG tablet Take 1-2 tablets (50-100 mg total) by mouth at bedtime. 180 tablet 1  traZODone (DESYREL) 50 MG tablet Take 1-2 tablets (50-100 mg total) by mouth at bedtime. 180 tablet 2   WEGOVY 0.5 MG/0.5ML SOAJ Inject 0.5 mg into the skin once a week for weight loss. 2 mL 0   WEGOVY 1 MG/0.5ML SOAJ Inject 1 mg into the skin once a week for weight loss 2 mL 0   WEGOVY 2.4 MG/0.75ML SOAJ Inject 2.4 mg into the skin once a week for weight loss. 3 mL 0   WEGOVY 2.4 MG/0.75ML SOAJ Inject 2.4 mg into the skin once a week fro weight loss 3 mL 0   WEGOVY 2.4 MG/0.75ML SOAJ Inject 2.4 mg into the skin once a week for weight loss 3 mL 0   WEGOVY 2.4 MG/0.75ML SOAJ Inject 2.4 mg,sub-q, into the skin once a week for weight loss 3 mL 0   WEGOVY 2.4 MG/0.75ML SOAJ Inject 2.4 mg into the skin once a week for weight loss. 3 mL 0   WEGOVY 2.4 MG/0.75ML SOAJ Inject 2.4 mg into the skin once a week for weight loss. 3 mL 0   WEGOVY 2.4 MG/0.75ML SOAJ Inject 2.4 mg into the skin once a week for weight loss 3 mL 0   No current facility-administered medications for this visit.    Medication Side Effects: None  Allergies:  Allergies  Allergen Reactions   Cat Hair Extract Anaphylaxis   Brewers Yeast Rash    Past Medical History:  Diagnosis Date   Depression    takes Welbutrin and Fetzima   GERD (gastroesophageal reflux disease)    takes nexium    Past Medical History, Surgical history, Social history, and Family history were  reviewed and updated as appropriate.   Please see review of systems for further details on the patient's review from today.   Objective:   Physical Exam:  There were no vitals taken for this visit.  Physical Exam Constitutional:      General: She is not in acute distress. Musculoskeletal:        General: No deformity.  Neurological:     Mental Status: She is alert and oriented to person, place, and time.     Coordination: Coordination normal.  Psychiatric:        Attention and Perception: Attention and perception normal. She does not perceive auditory or visual hallucinations.        Mood and Affect: Mood is not depressed. Affect is not labile, blunt, angry or inappropriate.        Speech: Speech normal.        Behavior: Behavior normal.        Thought Content: Thought content normal. Thought content is not paranoid or delusional. Thought content does not include homicidal or suicidal ideation. Thought content does not include homicidal or suicidal plan.        Cognition and Memory: Cognition and memory normal.        Judgment: Judgment normal.     Comments: Insight intact     Lab Review:     Component Value Date/Time   NA 140 09/01/2016 0852   K 4.4 09/01/2016 0852   CL 104 09/01/2016 0852   CO2 31 09/01/2016 0852   GLUCOSE 90 09/01/2016 0852   BUN 14 09/01/2016 0852   CREATININE 0.86 09/01/2016 0852   CALCIUM 9.2 09/01/2016 0852   PROT 7.0 09/01/2016 0852   ALBUMIN 4.3 09/01/2016 0852   AST 33 09/01/2016 0852   ALT 70 (H) 09/01/2016 0852   ALKPHOS 66 09/01/2016  0852   BILITOT 0.7 09/01/2016 0852       Component Value Date/Time   WBC 5.1 09/01/2016 0852   RBC 4.30 09/01/2016 0852   HGB 13.5 09/01/2016 0852   HCT 38.9 09/01/2016 0852   PLT 286.0 09/01/2016 0852   MCV 90.4 09/01/2016 0852   MCHC 34.6 09/01/2016 0852   RDW 11.9 09/01/2016 0852   LYMPHSABS 1.5 09/01/2016 0852   MONOABS 0.4 09/01/2016 0852   EOSABS 0.1 09/01/2016 0852   BASOSABS 0.0 09/01/2016  0852    No results found for: "POCLITH", "LITHIUM"   No results found for: "PHENYTOIN", "PHENOBARB", "VALPROATE", "CBMZ"   .res Assessment: Plan:    Recommendations/Plan   Continue Spravato treatment.  RTC in 3 days.   Patient advised to contact office with any questions, adverse effects, or acute worsening in signs and symptoms.   There are no diagnoses linked to this encounter.   Please see After Visit Summary for patient specific instructions.  Future Appointments  Date Time Provider Department Center  10/08/2023  1:50 PM [facility]    No orders of the defined types were placed in this encounter.   -------------------------------

## 2023-08-07 NOTE — Progress Notes (Signed)
NURSES NOTE:   Pt arrived for her # 12 Spravato Treatment for treatment resistant depression, the starting dose was 56 mg (2 of the 28 mg nasal sprays) pt stayed at that dose for the first 4 treatments and also takes Zofran and Meclizine prior to arrival at office. Pt received 84 mg (3 of the 28 mg nasal sprays) total again today and did also premedicate. Pt reports last weekend she was also sick so perhaps she had a virus which triggered the vomiting. Rene Kocher would like to extend pt's treatments to twice a week but having difficulty with insurance agreeing. Another prior authorization was initiated for the extended treatments but no response back yet. [facility] had been advised on her treatments and doses needed. Pt sees [name], NP and she will follow her throughout her treatments and follow ups.  Pt's Spravato is ordered through [facility].  Spravato medication is stored at treatment center per REMS/FDA guidelines. The medication is required to be locked behind two doors per REMS/FDA protocol. Medication is also disposed of properly after each use per regulations. All documentation for REMS is completed and submitted per FDA/REMS requirements.          Pt directed to treatment room, started vitals at 10:05 AM, 136/72, pulse 87. Pt did take Zofran 4 mg and Meclizine 25 mg prior to arriving at office, an hour ahead. Instructed patient to blow her nose if needed then recline back to a 45 degree angle if that was more comfortable for her. Gave patient first dose 28 mg nasal spray, administered in each nostril as directed and observed by nurse, waited 5 more minutes for the second and third doses. I did put a fan in her room in case it got too stuffy today. After all doses given pt did not complain of any nausea/vomiting, pt did have a drink and some mints to help with the taste of Spravato it gives the metallic taste. Her 40 minute check was at 10:45 AM,  Vital signs were 123/82, pulse 69. Pulse ox 96%. Pt aware she would be monitored for a total time of 120 minutes.  Discharge vitals were taken at 11:55 AM 127/84, P 69, Pulse Ox 96%. Pt met with Rene Kocher to discuss her treatment, pt will continue twice a week for 4 week total with the prior approval resubmitted.  Pt advised to relax the rest of the day. No driving, no intense activities. Verbalized understanding. Pt reports she did fine today. Nurse was with pt a total of 60 minutes for clinical assessment. Pt is scheduled Thursday, December 19th. Pt instructed to call office with any problems or questions prior to next treatment.      LOT [id number] APR 2027

## 2023-08-09 ENCOUNTER — Other Ambulatory Visit: Payer: Self-pay

## 2023-08-09 ENCOUNTER — Other Ambulatory Visit (HOSPITAL_COMMUNITY): Payer: Self-pay

## 2023-08-09 ENCOUNTER — Ambulatory Visit: Payer: Commercial Managed Care - PPO

## 2023-08-09 ENCOUNTER — Ambulatory Visit: Payer: Commercial Managed Care - PPO | Admitting: Adult Health

## 2023-08-09 VITALS — BP 131/85 | HR 75

## 2023-08-09 DIAGNOSIS — F339 Major depressive disorder, recurrent, unspecified: Secondary | ICD-10-CM

## 2023-08-09 DIAGNOSIS — F411 Generalized anxiety disorder: Secondary | ICD-10-CM | POA: Diagnosis not present

## 2023-08-09 NOTE — Progress Notes (Signed)
Specialty Pharmacy Refill Coordination Note  Robin Stevenson is a 47 y.o. female contacted today regarding refills of specialty medication(s) Esketamine HCl (Spravato (84 MG Dose))   Patient requested Courier to Provider Office   Delivery date: 08/10/23   Verified address: [facility]   ATTN [name]  [address]   Medication will be filled on 08/10/23.   Per [name] patient has next appointment on 12/23 and no additional appointments that week due to holiday. Following week appointments not yet scheduled as patient is unsure of plans. [name] requests 2 doses be sent regardless so she has a dose on hand when patient decides her schedule for week of 12/30. Contact [name] again 12/30 to see if patient has used second dose and/or when she is scheduled to do so.

## 2023-08-09 NOTE — Progress Notes (Signed)
NURSES NOTE:   Pt arrived for her # 13 Spravato Treatment for treatment resistant depression, the starting dose was 56 mg (2 of the 28 mg nasal sprays) pt stayed at that dose for the first 4 treatments and also takes Zofran and Meclizine prior to arrival at office. Pt received 84 mg (3 of the 28 mg nasal sprays) total again today and did also premedicate. [facility] had been advised on her treatments and doses needed. Pt sees [name], NP and she will follow her throughout her treatments and follow ups.  Pt's Spravato is ordered through [facility].  Spravato medication is stored at treatment center per REMS/FDA guidelines. The medication is required to be locked behind two doors per REMS/FDA protocol. Medication is also disposed of properly after each use per regulations. All documentation for REMS is completed and submitted per FDA/REMS requirements.          Pt directed to treatment room, started vitals at 1:20 PM, 125/86, pulse 82. Pt did take Zofran 4 mg and Meclizine 25 mg prior to arriving at office, an hour ahead. Instructed patient to blow her nose if needed then recline back to a 45 degree angle if that was more comfortable for her. Gave patient first dose 28 mg nasal spray, administered in each nostril as directed and observed by nurse, waited 5 more minutes for the second and third doses. Pt felt like she did not need a fan today she was cooler today. After all doses given pt did not complain of any nausea/vomiting, pt did have a drink and some mints to help with the taste of Spravato it gives the metallic taste. Her 40 minute check was at 2:10 PM, Vital signs were 134/86, pulse 74. Pulse ox 96%. Pt aware she would be monitored for a total time of 120 minutes.  Discharge vitals were taken at 3:15 PM 131/85, P 75, Pulse Ox 96%. Pt met with Rene Kocher to discuss her treatment, pt will continue twice a week for 4 week total with the prior approval  resubmitted.  Pt advised to relax the rest of the day. No driving, no intense activities. Verbalized understanding. Pt reports no issues today. Nurse was with pt a total of 60 minutes for clinical assessment. Pt is scheduled Monday, December 30th. Pt instructed to call office with any problems or questions prior to next treatment.      LOT [id number] APR 2027

## 2023-08-09 NOTE — Progress Notes (Signed)
Robin Stevenson [id number] 1976/08/07 47 y.o.  Subjective:   Patient ID:  Robin Stevenson is a 47 y.o. (DOB 10-Dec-1975) female.  Chief Complaint: No chief complaint on file.   HPI Robin Stevenson presents to the office today for follow-up of  treatment resistant depression (TRD).   Spravato treatment    Patient was administered Spravato 84 mg intranasally today. Patient was observed by provider throughout [facility] treatment. The patient experienced the typical dissociation which gradually resolved over the 2-hour period of observation. There were minor untoward symptoms reported to include nausea and vomiting. She did not feel sleepy, decreased feeling of sensitivity (numbness), lack of energy, increased blood pressure, feeling happy or very excited, or headache. She denied feeling disconnected from themself, their thoughts, feelings and things around them. Blood pressures remained within normal ranges at the 40-minute and 2-hour follow-up intervals. By the time the 2-hour observation period was met the patient was alert and oriented and able to exit without assistance. Patient willing to continue Spravato administration for the treatment of resistant depression. See nursing note for further details.      Review of Systems:  Review of Systems  Musculoskeletal:  Negative for gait problem.  Neurological:  Negative for tremors.  Psychiatric/Behavioral:         Please refer to HPI    Medications: I have reviewed the patient's current medications.  Current Outpatient Medications  Medication Sig Dispense Refill   ALPRAZolam (XANAX) 0.5 MG tablet Take 1 tablet at bedtime the night before procedure. Take 1 tablet 1 hour before arrival. Bring third tablet with you to procedure. 3 tablet 0   atorvastatin (LIPITOR) 20 MG tablet Take 1 tablet (20 mg total) by mouth daily. 90 tablet 3   atovaquone-proguanil (MALARONE) 250-100 MG TABS tablet Take 1 tablet by mouth once daily begin 2  days prearrival,take daily during stay & continue for 7 days posttravel for malaria prevention 24 tablet 0   azithromycin (ZITHROMAX) 500 MG tablet For non bloody diarrhea,take 2 tabs on day 1, if resolved,stop med,If diarrhea persists 1 tab on day 2 & 3. For bloody diarrhea,2 tabs on day 1 & 1 tab on day 2 & 3 4 tablet 0   buPROPion (WELLBUTRIN XL) 150 MG 24 hr tablet   3   buPROPion (WELLBUTRIN XL) 150 MG 24 hr tablet Take 1 tablet (150 mg total) by mouth every morning. 90 tablet 0   cariprazine (VRAYLAR) 1.5 MG capsule Take 1 capsule (1.5 mg total) by mouth daily. 28 capsule 0   celecoxib (CELEBREX) 200 MG capsule Take 1 capsule by mouth single dose as directed. Take 1 hour prior to arrival for surgery 1 capsule 0   Esketamine HCl, 56 MG Dose, (SPRAVATO, 56 MG DOSE,) 28 MG/DEVICE SOPK Dispense X 2 (28mg ) devices, administer on day 1 intranasally as directed 4 each 0   Esketamine HCl, 84 MG Dose, (SPRAVATO, 84 MG DOSE,) 28 MG/DEVICE SOPK Dispense X 3 (28 mg) devices for treatments twice a week administer intranasally 6 each 4   esomeprazole (NEXIUM) 20 MG capsule Take 20 mg by mouth once.     FETZIMA 120 MG CP24   3   fluocinonide ointment (LIDEX) 0.05 % Apply as directed to skin twice a day 30 g 0   gabapentin (NEURONTIN) 300 MG capsule Take 1 capsule (300 mg total) by mouth 3 (three) times daily. Take first dose 1 hour before arrival for surgery. 21 capsule 0   ketoconazole (NIZORAL) 2 %  cream Apply a small amount to affected area on left foot/toe once a day 30 g 0   Levomilnacipran HCl ER (FETZIMA) 120 MG CP24 Take 1 capsule by mouth daily. 90 capsule 3   meclizine (ANTIVERT) 25 MG tablet Take 1 tablet (25 mg total) by mouth as directed prior to treatment. 60 tablet 0   metFORMIN (GLUCOPHAGE) 500 MG tablet Take 1 tablet (500 mg total) by mouth daily. 90 tablet 1   metFORMIN (GLUCOPHAGE) 500 MG tablet Take 1 tablet (500 mg total) by mouth 2 (two) times daily. 180 tablet 1   metFORMIN  (GLUCOPHAGE) 500 MG tablet Take 1 tablet (500 mg total) by mouth 2 (two) times daily. 90 tablet 1   metFORMIN (GLUCOPHAGE-XR) 500 MG 24 hr tablet Take 1 tablet (500 mg total) by mouth daily. Please do not fill if not picked up within 30 days. 30 tablet 0   methocarbamol (ROBAXIN) 500 MG tablet Take 1 tablet (500 mg total) by mouth every 6 (six) hours as needed for muscle spasms. (Patient not taking: Reported on 05/22/2018) 45 tablet 0   ondansetron (ZOFRAN) 4 MG tablet Take 1 tablet (4 mg total) by mouth every 6 (six) hours for nausea. TAKE 4 tablets 15 minutes prior to arrival for surgery 30 tablet 0   ondansetron (ZOFRAN) 4 MG tablet Take 1 tablet  by mouth every 6 hours as needed for nausea 15 tablet 0   ondansetron (ZOFRAN-ODT) 4 MG disintegrating tablet Dissolve 1 tablet (4 mg total) by mouth every 8 (eight) hours as needed for nausea or vomiting. 20 tablet 1   oxyCODONE (OXY IR/ROXICODONE) 5 MG immediate release tablet Take 1-2 tablet by mouth every six to eight hours as needed for Post Op Pain. DO NOT EXCEED  6 tablets in 24 hours. 30 tablet 0   polyethylene glycol (MIRALAX / GLYCOLAX) packet Take 17 g by mouth daily.     polyethylene glycol-electrolytes (NULYTELY) 420 g solution Take 4,000 mLs by mouth Nightly. 4000 mL 0   Semaglutide-Weight Management (WEGOVY) 2.4 MG/0.75ML SOAJ Inject 2.4 mg into the skin once a week for weight loss. 3 mL 0   sulfamethoxazole-trimethoprim (BACTRIM DS) 800-160 MG tablet Take 1 tablet by mouth 2 (two) times daily as directed. Start the day after surgery. 10 tablet 0   SUMAtriptan (IMITREX) 50 MG tablet TAKE 1 TABLET BY MOUTH AT ONSET OF HEADACHE, MAY REPEAT 1 DOSE AFTER 2 HOURS IF NEEDED 10 tablet 5   SUMAtriptan (IMITREX) 50 MG tablet Take 1 tablet by mouth at onset of headache, may repeat same dose once 2 hours later if needed 10 tablet 5   SUMAtriptan (IMITREX) 50 MG tablet Take 1 tablet (50 mg total) by mouth at the onset of headache. May repeat 1 dose after  2 hours if needed. 10 tablet 5   SUMAtriptan (IMITREX) 50 MG tablet Take 1 tablet (50 mg total) by mouth at onset of headache,may repeat 1 dose after 2 hours if needed, 2 to 3 times a week 9 tablet 6   terbinafine (LAMISIL) 250 MG tablet Take 1 tablet (250 mg total) by mouth daily for Tinea 14 tablet 0   traZODone (DESYREL) 50 MG tablet Take 50 mg by mouth at bedtime.     traZODone (DESYREL) 50 MG tablet TAKE 1 TO 2 TABLETS BY MOUTH AT BEDTIME 180 tablet 1   traZODone (DESYREL) 50 MG tablet Take 1-2 tablets (50-100 mg total) by mouth at bedtime. 180 tablet 1   traZODone (DESYREL)  50 MG tablet Take 1-2 tablets (50-100 mg total) by mouth at bedtime. 180 tablet 2   WEGOVY 0.5 MG/0.5ML SOAJ Inject 0.5 mg into the skin once a week for weight loss. 2 mL 0   WEGOVY 1 MG/0.5ML SOAJ Inject 1 mg into the skin once a week for weight loss 2 mL 0   WEGOVY 2.4 MG/0.75ML SOAJ Inject 2.4 mg into the skin once a week for weight loss. 3 mL 0   WEGOVY 2.4 MG/0.75ML SOAJ Inject 2.4 mg into the skin once a week fro weight loss 3 mL 0   WEGOVY 2.4 MG/0.75ML SOAJ Inject 2.4 mg into the skin once a week for weight loss 3 mL 0   WEGOVY 2.4 MG/0.75ML SOAJ Inject 2.4 mg,sub-q, into the skin once a week for weight loss 3 mL 0   WEGOVY 2.4 MG/0.75ML SOAJ Inject 2.4 mg into the skin once a week for weight loss. 3 mL 0   WEGOVY 2.4 MG/0.75ML SOAJ Inject 2.4 mg into the skin once a week for weight loss. 3 mL 0   WEGOVY 2.4 MG/0.75ML SOAJ Inject 2.4 mg into the skin once a week for weight loss 3 mL 0   No current facility-administered medications for this visit.    Medication Side Effects: None  Allergies:  Allergies  Allergen Reactions   Cat Hair Extract Anaphylaxis   Brewers Yeast Rash    Past Medical History:  Diagnosis Date   Depression    takes Welbutrin and Fetzima   GERD (gastroesophageal reflux disease)    takes nexium    Past Medical History, Surgical history, Social history, and Family history were  reviewed and updated as appropriate.   Please see review of systems for further details on the patient's review from today.   Objective:   Physical Exam:  There were no vitals taken for this visit.  Physical Exam Constitutional:      General: She is not in acute distress. Musculoskeletal:        General: No deformity.  Neurological:     Mental Status: She is alert and oriented to person, place, and time.     Coordination: Coordination normal.  Psychiatric:        Attention and Perception: Attention and perception normal. She does not perceive auditory or visual hallucinations.        Mood and Affect: Mood normal. Mood is not anxious or depressed. Affect is not labile, blunt, angry or inappropriate.        Speech: Speech normal.        Behavior: Behavior normal.        Thought Content: Thought content normal. Thought content is not paranoid or delusional. Thought content does not include homicidal or suicidal ideation. Thought content does not include homicidal or suicidal plan.        Cognition and Memory: Cognition and memory normal.        Judgment: Judgment normal.     Comments: Insight intact     Lab Review:     Component Value Date/Time   NA 140 09/01/2016 0852   K 4.4 09/01/2016 0852   CL 104 09/01/2016 0852   CO2 31 09/01/2016 0852   GLUCOSE 90 09/01/2016 0852   BUN 14 09/01/2016 0852   CREATININE 0.86 09/01/2016 0852   CALCIUM 9.2 09/01/2016 0852   PROT 7.0 09/01/2016 0852   ALBUMIN 4.3 09/01/2016 0852   AST 33 09/01/2016 0852   ALT 70 (H) 09/01/2016 0852   ALKPHOS  66 09/01/2016 0852   BILITOT 0.7 09/01/2016 0852       Component Value Date/Time   WBC 5.1 09/01/2016 0852   RBC 4.30 09/01/2016 0852   HGB 13.5 09/01/2016 0852   HCT 38.9 09/01/2016 0852   PLT 286.0 09/01/2016 0852   MCV 90.4 09/01/2016 0852   MCHC 34.6 09/01/2016 0852   RDW 11.9 09/01/2016 0852   LYMPHSABS 1.5 09/01/2016 0852   MONOABS 0.4 09/01/2016 0852   EOSABS 0.1 09/01/2016 0852    BASOSABS 0.0 09/01/2016 0852    No results found for: "POCLITH", "LITHIUM"   No results found for: "PHENYTOIN", "PHENOBARB", "VALPROATE", "CBMZ"   .res Assessment: Plan:    Recommendations/Plan   Continue Spravato treatment.  RTC in 3 days.   Patient advised to contact office with any questions, adverse effects, or acute worsening in signs and symptoms.   There are no diagnoses linked to this encounter.   Please see After Visit Summary for patient specific instructions.  Future Appointments  Date Time Provider Department Center  08/13/2023  1:30 PM CP-NURSE [facility] None  08/13/2023  1:30 PM Lilit Cinelli, Thereasa Solo, NP [facility] None  10/08/2023  1:50 PM [facility]    No orders of the defined types were placed in this encounter.   -------------------------------

## 2023-08-10 ENCOUNTER — Other Ambulatory Visit: Payer: Self-pay

## 2023-08-10 NOTE — Progress Notes (Signed)
08/10/23 REMS Authorization Code [id number]

## 2023-08-13 ENCOUNTER — Ambulatory Visit: Payer: Commercial Managed Care - PPO

## 2023-08-13 ENCOUNTER — Encounter: Payer: Self-pay | Admitting: Adult Health

## 2023-08-13 ENCOUNTER — Encounter: Payer: Commercial Managed Care - PPO | Admitting: Adult Health

## 2023-08-13 ENCOUNTER — Ambulatory Visit: Payer: Commercial Managed Care - PPO | Admitting: Adult Health

## 2023-08-13 VITALS — BP 144/83 | HR 77

## 2023-08-13 DIAGNOSIS — F339 Major depressive disorder, recurrent, unspecified: Secondary | ICD-10-CM | POA: Diagnosis not present

## 2023-08-13 NOTE — Progress Notes (Signed)
Robin Stevenson [id number] [birth year] 47 y.o.  Subjective:   Patient ID:  Robin Stevenson is a 47 y.o. (DOB Nov 03, 1975) female.  Chief Complaint: No chief complaint on file.   HPI Robin Stevenson presents to the office today for follow-up of treatment resistant depression (TRD).   Spravato treatment    Patient was administered Spravato 84 mg intranasally today. Patient was observed by provider throughout [facility] treatment. The patient experienced the typical dissociation which gradually resolved over the 2-hour period of observation. There were minor untoward symptoms reported to include nausea and vomiting. She did not feel sleepy, decreased feeling of sensitivity (numbness), lack of energy, increased blood pressure, feeling happy or very excited, or headache. She denied feeling disconnected from themself, their thoughts, feelings and things around them. Blood pressures remained within normal ranges at the 40-minute and 2-hour follow-up intervals. By the time the 2-hour observation period was met the patient was alert and oriented and able to exit without assistance. Patient willing to continue Spravato administration for the treatment of resistant depression. See nursing note for further details.      Review of Systems:  Review of Systems  Musculoskeletal:  Negative for gait problem.  Neurological:  Negative for tremors.  Psychiatric/Behavioral:         Please refer to HPI    Medications: I have reviewed the patient's current medications.  Current Outpatient Medications  Medication Sig Dispense Refill   ALPRAZolam (XANAX) 0.5 MG tablet Take 1 tablet at bedtime the night before procedure. Take 1 tablet 1 hour before arrival. Bring third tablet with you to procedure. 3 tablet 0   atorvastatin (LIPITOR) 20 MG tablet Take 1 tablet (20 mg total) by mouth daily. 90 tablet 3   atovaquone-proguanil (MALARONE) 250-100 MG TABS tablet Take 1 tablet by mouth once daily begin 2 days  prearrival,take daily during stay & continue for 7 days posttravel for malaria prevention 24 tablet 0   azithromycin (ZITHROMAX) 500 MG tablet For non bloody diarrhea,take 2 tabs on day 1, if resolved,stop med,If diarrhea persists 1 tab on day 2 & 3. For bloody diarrhea,2 tabs on day 1 & 1 tab on day 2 & 3 4 tablet 0   buPROPion (WELLBUTRIN XL) 150 MG 24 hr tablet   3   buPROPion (WELLBUTRIN XL) 150 MG 24 hr tablet Take 1 tablet (150 mg total) by mouth every morning. 90 tablet 0   cariprazine (VRAYLAR) 1.5 MG capsule Take 1 capsule (1.5 mg total) by mouth daily. 28 capsule 0   celecoxib (CELEBREX) 200 MG capsule Take 1 capsule by mouth single dose as directed. Take 1 hour prior to arrival for surgery 1 capsule 0   Esketamine HCl, 56 MG Dose, (SPRAVATO, 56 MG DOSE,) 28 MG/DEVICE SOPK Dispense X 2 (28mg ) devices, administer on day 1 intranasally as directed 4 each 0   Esketamine HCl, 84 MG Dose, (SPRAVATO, 84 MG DOSE,) 28 MG/DEVICE SOPK Dispense X 3 (28 mg) devices for treatments twice a week administer intranasally 6 each 4   esomeprazole (NEXIUM) 20 MG capsule Take 20 mg by mouth once.     FETZIMA 120 MG CP24   3   fluocinonide ointment (LIDEX) 0.05 % Apply as directed to skin twice a day 30 g 0   gabapentin (NEURONTIN) 300 MG capsule Take 1 capsule (300 mg total) by mouth 3 (three) times daily. Take first dose 1 hour before arrival for surgery. 21 capsule 0   ketoconazole (NIZORAL) 2 % cream  Apply a small amount to affected area on left foot/toe once a day 30 g 0   Levomilnacipran HCl ER (FETZIMA) 120 MG CP24 Take 1 capsule by mouth daily. 90 capsule 3   meclizine (ANTIVERT) 25 MG tablet Take 1 tablet (25 mg total) by mouth as directed prior to treatment. 60 tablet 0   metFORMIN (GLUCOPHAGE) 500 MG tablet Take 1 tablet (500 mg total) by mouth daily. 90 tablet 1   metFORMIN (GLUCOPHAGE) 500 MG tablet Take 1 tablet (500 mg total) by mouth 2 (two) times daily. 180 tablet 1   metFORMIN (GLUCOPHAGE) 500  MG tablet Take 1 tablet (500 mg total) by mouth 2 (two) times daily. 90 tablet 1   metFORMIN (GLUCOPHAGE-XR) 500 MG 24 hr tablet Take 1 tablet (500 mg total) by mouth daily. Please do not fill if not picked up within 30 days. 30 tablet 0   methocarbamol (ROBAXIN) 500 MG tablet Take 1 tablet (500 mg total) by mouth every 6 (six) hours as needed for muscle spasms. (Patient not taking: Reported on 05/22/2018) 45 tablet 0   ondansetron (ZOFRAN) 4 MG tablet Take 1 tablet (4 mg total) by mouth every 6 (six) hours for nausea. TAKE 4 tablets 15 minutes prior to arrival for surgery 30 tablet 0   ondansetron (ZOFRAN) 4 MG tablet Take 1 tablet  by mouth every 6 hours as needed for nausea 15 tablet 0   ondansetron (ZOFRAN-ODT) 4 MG disintegrating tablet Dissolve 1 tablet (4 mg total) by mouth every 8 (eight) hours as needed for nausea or vomiting. 20 tablet 1   oxyCODONE (OXY IR/ROXICODONE) 5 MG immediate release tablet Take 1-2 tablet by mouth every six to eight hours as needed for Post Op Pain. DO NOT EXCEED  6 tablets in 24 hours. 30 tablet 0   polyethylene glycol (MIRALAX / GLYCOLAX) packet Take 17 g by mouth daily.     polyethylene glycol-electrolytes (NULYTELY) 420 g solution Take 4,000 mLs by mouth Nightly. 4000 mL 0   Semaglutide-Weight Management (WEGOVY) 2.4 MG/0.75ML SOAJ Inject 2.4 mg into the skin once a week for weight loss. 3 mL 0   sulfamethoxazole-trimethoprim (BACTRIM DS) 800-160 MG tablet Take 1 tablet by mouth 2 (two) times daily as directed. Start the day after surgery. 10 tablet 0   SUMAtriptan (IMITREX) 50 MG tablet TAKE 1 TABLET BY MOUTH AT ONSET OF HEADACHE, MAY REPEAT 1 DOSE AFTER 2 HOURS IF NEEDED 10 tablet 5   SUMAtriptan (IMITREX) 50 MG tablet Take 1 tablet by mouth at onset of headache, may repeat same dose once 2 hours later if needed 10 tablet 5   SUMAtriptan (IMITREX) 50 MG tablet Take 1 tablet (50 mg total) by mouth at the onset of headache. May repeat 1 dose after 2 hours if  needed. 10 tablet 5   SUMAtriptan (IMITREX) 50 MG tablet Take 1 tablet (50 mg total) by mouth at onset of headache,may repeat 1 dose after 2 hours if needed, 2 to 3 times a week 9 tablet 6   terbinafine (LAMISIL) 250 MG tablet Take 1 tablet (250 mg total) by mouth daily for Tinea 14 tablet 0   traZODone (DESYREL) 50 MG tablet Take 50 mg by mouth at bedtime.     traZODone (DESYREL) 50 MG tablet TAKE 1 TO 2 TABLETS BY MOUTH AT BEDTIME 180 tablet 1   traZODone (DESYREL) 50 MG tablet Take 1-2 tablets (50-100 mg total) by mouth at bedtime. 180 tablet 1   traZODone (DESYREL) 50  MG tablet Take 1-2 tablets (50-100 mg total) by mouth at bedtime. 180 tablet 2   WEGOVY 0.5 MG/0.5ML SOAJ Inject 0.5 mg into the skin once a week for weight loss. 2 mL 0   WEGOVY 1 MG/0.5ML SOAJ Inject 1 mg into the skin once a week for weight loss 2 mL 0   WEGOVY 2.4 MG/0.75ML SOAJ Inject 2.4 mg into the skin once a week for weight loss. 3 mL 0   WEGOVY 2.4 MG/0.75ML SOAJ Inject 2.4 mg into the skin once a week fro weight loss 3 mL 0   WEGOVY 2.4 MG/0.75ML SOAJ Inject 2.4 mg into the skin once a week for weight loss 3 mL 0   WEGOVY 2.4 MG/0.75ML SOAJ Inject 2.4 mg,sub-q, into the skin once a week for weight loss 3 mL 0   WEGOVY 2.4 MG/0.75ML SOAJ Inject 2.4 mg into the skin once a week for weight loss. 3 mL 0   WEGOVY 2.4 MG/0.75ML SOAJ Inject 2.4 mg into the skin once a week for weight loss. 3 mL 0   WEGOVY 2.4 MG/0.75ML SOAJ Inject 2.4 mg into the skin once a week for weight loss 3 mL 0   No current facility-administered medications for this visit.    Medication Side Effects: None  Allergies:  Allergies  Allergen Reactions   Cat Hair Extract Anaphylaxis   Brewers Yeast Rash    Past Medical History:  Diagnosis Date   Depression    takes Welbutrin and Fetzima   GERD (gastroesophageal reflux disease)    takes nexium    Past Medical History, Surgical history, Social history, and Family history were reviewed and  updated as appropriate.   Please see review of systems for further details on the patient's review from today.   Objective:   Physical Exam:  There were no vitals taken for this visit.  Physical Exam Constitutional:      General: She is not in acute distress. Musculoskeletal:        General: No deformity.  Neurological:     Mental Status: She is alert and oriented to person, place, and time.     Coordination: Coordination normal.  Psychiatric:        Attention and Perception: Attention and perception normal. She does not perceive auditory or visual hallucinations.        Mood and Affect: Mood normal. Mood is not anxious or depressed. Affect is not labile, blunt, angry or inappropriate.        Speech: Speech normal.        Behavior: Behavior normal.        Thought Content: Thought content normal. Thought content is not paranoid or delusional. Thought content does not include homicidal or suicidal ideation. Thought content does not include homicidal or suicidal plan.        Cognition and Memory: Cognition and memory normal.        Judgment: Judgment normal.     Comments: Insight intact     Lab Review:     Component Value Date/Time   NA 140 09/01/2016 0852   K 4.4 09/01/2016 0852   CL 104 09/01/2016 0852   CO2 31 09/01/2016 0852   GLUCOSE 90 09/01/2016 0852   BUN 14 09/01/2016 0852   CREATININE 0.86 09/01/2016 0852   CALCIUM 9.2 09/01/2016 0852   PROT 7.0 09/01/2016 0852   ALBUMIN 4.3 09/01/2016 0852   AST 33 09/01/2016 0852   ALT 70 (H) 09/01/2016 0852   ALKPHOS 66  09/01/2016 0852   BILITOT 0.7 09/01/2016 0852       Component Value Date/Time   WBC 5.1 09/01/2016 0852   RBC 4.30 09/01/2016 0852   HGB 13.5 09/01/2016 0852   HCT 38.9 09/01/2016 0852   PLT 286.0 09/01/2016 0852   MCV 90.4 09/01/2016 0852   MCHC 34.6 09/01/2016 0852   RDW 11.9 09/01/2016 0852   LYMPHSABS 1.5 09/01/2016 0852   MONOABS 0.4 09/01/2016 0852   EOSABS 0.1 09/01/2016 0852   BASOSABS 0.0  09/01/2016 0852    No results found for: "POCLITH", "LITHIUM"   No results found for: "PHENYTOIN", "PHENOBARB", "VALPROATE", "CBMZ"   .res Assessment: Plan:    Recommendations/Plan   Continue Spravato treatment.   Patient advised to contact office with any questions, adverse effects, or acute worsening in signs and symptoms.   There are no diagnoses linked to this encounter.   Please see After Visit Summary for patient specific instructions.  Future Appointments  Date Time Provider Department Center  10/08/2023  1:50 PM [facility]    No orders of the defined types were placed in this encounter.   -------------------------------

## 2023-08-16 ENCOUNTER — Other Ambulatory Visit: Payer: Self-pay

## 2023-08-19 NOTE — Progress Notes (Signed)
NURSES NOTE:   Pt arrived for her # 14 Spravato Treatment for treatment resistant depression, the starting dose was 56 mg (2 of the 28 mg nasal sprays) pt stayed at that dose for the first 4 treatments and also takes Zofran and Meclizine prior to arrival at office. Pt received 84 mg (3 of the 28 mg nasal sprays) total again today and did also premedicate. [facility] had been advised on her treatments and doses needed. Pt sees [name], NP and she will follow her throughout her treatments and follow ups.  Pt's Spravato is ordered through [facility].  Spravato medication is stored at treatment center per REMS/FDA guidelines. The medication is required to be locked behind two doors per REMS/FDA protocol. Medication is also disposed of properly after each use per regulations. All documentation for REMS is completed and submitted per FDA/REMS requirements.          Pt directed to treatment room, started vitals at 1:30 PM, 128/88, pulse 84. Pt did take Zofran 4 mg and Meclizine 25 mg prior to arriving at office, an hour ahead. Instructed patient to blow her nose if needed then recline back to a 45 degree angle if that was more comfortable for her. Gave patient first dose 28 mg nasal spray, administered in each nostril as directed and observed by nurse, waited 5 more minutes for the second and third doses. Pt felt like she did not need a fan today she was cooler today. After all doses given pt did not complain of any nausea/vomiting, pt did have a drink and some mints to help with the taste of Spravato it gives the metallic taste. Her 40 minute check was at 2:20 PM, Vital signs were 143/95, pulse 75. Pulse ox 96%. Pt aware she would be monitored for a total time of 120 minutes.  Discharge vitals were taken at 3:20 PM 144/83, P 77, Pulse Ox 96%. Pt met with Rene Kocher to discuss her treatment, pt will continue twice a week for 4 week total with the prior approval  resubmitted.  Pt advised to relax the rest of the day. No driving, no intense activities. Verbalized understanding. Pt reports no issues today. Nurse was with pt a total of 60 minutes for clinical assessment. Pt is scheduled Monday, December 31st. Pt instructed to call office with any problems or questions prior to next treatment.      LOT [id number] APR 2027

## 2023-08-20 ENCOUNTER — Other Ambulatory Visit (HOSPITAL_COMMUNITY): Payer: Self-pay

## 2023-08-21 ENCOUNTER — Ambulatory Visit (INDEPENDENT_AMBULATORY_CARE_PROVIDER_SITE_OTHER): Payer: Commercial Managed Care - PPO | Admitting: Adult Health

## 2023-08-21 ENCOUNTER — Ambulatory Visit: Payer: Commercial Managed Care - PPO

## 2023-08-21 ENCOUNTER — Other Ambulatory Visit (HOSPITAL_COMMUNITY): Payer: Self-pay

## 2023-08-21 ENCOUNTER — Encounter: Payer: Self-pay | Admitting: Adult Health

## 2023-08-21 VITALS — BP 136/86 | HR 71

## 2023-08-21 DIAGNOSIS — F339 Major depressive disorder, recurrent, unspecified: Secondary | ICD-10-CM

## 2023-08-21 NOTE — Progress Notes (Signed)
 NURSES NOTE:   Pt arrived for her # 15 Spravato  Treatment for treatment resistant depression, the starting dose was 56 mg (2 of the 28 mg nasal sprays) pt stayed at that dose for the first 4 treatments and also takes Zofran  and Meclizine  prior to arrival at office. Pt received 84 mg (3 of the 28 mg nasal sprays) total again today and did also premedicate. [facility] had been advised on her treatments and doses needed. Pt sees Angeline Sayers, NP and she will follow her throughout her treatments and follow ups.  Pt's Spravato  is ordered through [facility]  Spravato  medication is stored at treatment center per REMS/FDA guidelines. The medication is required to be locked behind two doors per REMS/FDA protocol. Medication is also disposed of properly after each use per regulations. All documentation for REMS is completed and submitted per FDA/REMS requirements.          Pt directed to treatment room, started vitals at 10:00 AM, 131/83, pulse 76. Pt did take Zofran  4 mg and Meclizine  25 mg prior to arriving at office, an hour ahead. Instructed patient to blow her nose if needed then recline back to a 45 degree angle if that was more comfortable for her. Gave patient first dose 28 mg nasal spray, administered in each nostril as directed and observed by nurse, waited 5 more minutes for the second and third doses. Pt felt like she did not need a fan today she was cooler today. After all doses given pt did not complain of any nausea/vomiting, pt did have a drink and some mints to help with the taste of Spravato  it gives the metallic taste. Her 40 minute check was at 10:40 AM, Vital signs were 144/87, pulse 75. Pulse ox 96%. Pt aware she would be monitored for a total time of 120 minutes.  Discharge vitals were taken at 12:00 PM 136/86, P 71, Pulse Ox 96%. Pt met with Angeline to discuss her treatment, pt will continue twice a week for 4 week total with the prior approval  resubmitted.  Pt advised to relax the rest of the day. No driving, no intense activities. Verbalized understanding. Pt reports no issues today. Nurse was with pt a total of 60 minutes for clinical assessment. Pt will plan on returning next week. Pt instructed to call office with any problems or questions prior to next treatment.      LOT [id number] APR 2027

## 2023-08-21 NOTE — Progress Notes (Signed)
 Robin Stevenson [id number] 1975-10-28 47 y.o.  Subjective:   Patient ID:  Robin Stevenson is a 47 y.o. (DOB May 16, 1976) female.  Chief Complaint: No chief complaint on file.   HPI Robin Stevenson presents to the office today for follow-up of treatment resistant depression (TRD).   Spravato  treatment    Patient was administered Spravato  84 mg intranasally today. Patient was observed by provider throughout Spravato  treatment. The patient experienced the typical dissociation which gradually resolved over the 2-hour period of observation. She did not feel sleepy, decreased feeling of sensitivity (numbness), lack of energy, increased blood pressure, feeling happy or very excited, or headache. She denied feeling disconnected from themself, their thoughts, feelings and things around them. Blood pressures remained within normal ranges at the 40-minute and 2-hour follow-up intervals. By the time the 2-hour observation period was met the patient was alert and oriented and able to exit without assistance. 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 See nursing note for further details.      Review of Systems:  Review of Systems  Musculoskeletal:  Negative for gait problem.  Neurological:  Negative for tremors.  Psychiatric/Behavioral:         Please refer to HPI    Medications: I have reviewed the patient's current medications.  Current Outpatient Medications  Medication Sig Dispense Refill   ALPRAZolam  (XANAX ) 0.5 MG tablet Take 1 tablet at bedtime the night before procedure. Take 1 tablet 1 hour before arrival. Bring third tablet with you to procedure. 3 tablet 0   atorvastatin  (LIPITOR) 20 MG tablet Take 1 tablet (20 mg total) by mouth daily. 90 tablet 3   atovaquone -proguanil (MALARONE ) 250-100 MG TABS tablet Take 1 tablet by mouth once daily begin 2 days prearrival,take daily during stay & continue for 7 days posttravel for  malaria prevention 24 tablet 0   azithromycin  (ZITHROMAX ) 500 MG tablet For non bloody diarrhea,take 2 tabs on day 1, if resolved,stop med,If diarrhea persists 1 tab on day 2 & 3. For bloody diarrhea,2 tabs on day 1 & 1 tab on day 2 & 3 4 tablet 0   buPROPion  (WELLBUTRIN  XL) 150 MG 24 hr tablet   3   buPROPion  (WELLBUTRIN  XL) 150 MG 24 hr tablet Take 1 tablet (150 mg total) by mouth every morning. 90 tablet 0   cariprazine  (VRAYLAR ) 1.5 MG capsule Take 1 capsule (1.5 mg total) by mouth daily. 28 capsule 0   celecoxib  (CELEBREX ) 200 MG capsule Take 1 capsule by mouth single dose as directed. Take 1 hour prior to arrival for surgery 1 capsule 0   Esketamine HCl, 56 MG Dose, (SPRAVATO , 56 MG DOSE,) 28 MG/DEVICE SOPK Dispense X 2 (28mg ) devices, administer on day 1 intranasally as directed 4 each 0   Esketamine HCl, 84 MG Dose, (SPRAVATO , 84 MG DOSE,) 28 MG/DEVICE SOPK Dispense X 3 (28 mg) devices for treatments twice a week administer intranasally 6 each 4   esomeprazole (NEXIUM) 20 MG capsule Take 20 mg by mouth once.     FETZIMA  120 MG CP24   3   fluocinonide  ointment (LIDEX ) 0.05 % Apply as directed to skin twice a day 30 g 0   gabapentin  (NEURONTIN ) 300 MG capsule Take 1 capsule (300 mg total) by mouth 3 (three) times daily. Take first dose 1 hour before arrival for surgery. 21 capsule 0   ketoconazole  (NIZORAL ) 2 % cream Apply a small amount to affected area on left foot/toe once  a day 30 g 0   Levomilnacipran  HCl ER (FETZIMA ) 120 MG CP24 Take 1 capsule by mouth daily. 90 capsule 3   meclizine  (ANTIVERT ) 25 MG tablet Take 1 tablet (25 mg total) by mouth as directed prior to treatment. 60 tablet 0   metFORMIN  (GLUCOPHAGE ) 500 MG tablet Take 1 tablet (500 mg total) by mouth daily. 90 tablet 1   metFORMIN  (GLUCOPHAGE ) 500 MG tablet Take 1 tablet (500 mg total) by mouth 2 (two) times daily. 180 tablet 1   metFORMIN  (GLUCOPHAGE ) 500 MG tablet Take 1 tablet (500 mg total) by mouth 2 (two) times daily.  90 tablet 1   metFORMIN  (GLUCOPHAGE -XR) 500 MG 24 hr tablet Take 1 tablet (500 mg total) by mouth daily. Please do not fill if not picked up within 30 days. 30 tablet 0   methocarbamol  (ROBAXIN ) 500 MG tablet Take 1 tablet (500 mg total) by mouth every 6 (six) hours as needed for muscle spasms. (Patient not taking: Reported on 05/22/2018) 45 tablet 0   ondansetron  (ZOFRAN ) 4 MG tablet Take 1 tablet (4 mg total) by mouth every 6 (six) hours for nausea. TAKE 4 tablets 15 minutes prior to arrival for surgery 30 tablet 0   ondansetron  (ZOFRAN ) 4 MG tablet Take 1 tablet  by mouth every 6 hours as needed for nausea 15 tablet 0   ondansetron  (ZOFRAN -ODT) 4 MG disintegrating tablet Dissolve 1 tablet (4 mg total) by mouth every 8 (eight) hours as needed for nausea or vomiting. 20 tablet 1   oxyCODONE  (OXY IR/ROXICODONE ) 5 MG immediate release tablet Take 1-2 tablet by mouth every six to eight hours as needed for Post Op Pain. DO NOT EXCEED  6 tablets in 24 hours. 30 tablet 0   polyethylene glycol (MIRALAX / GLYCOLAX) packet Take 17 g by mouth daily.     polyethylene glycol-electrolytes (NULYTELY) 420 g solution Take 4,000 mLs by mouth Nightly. 4000 mL 0   Semaglutide -Weight Management (WEGOVY ) 2.4 MG/0.75ML SOAJ Inject 2.4 mg into the skin once a week for weight loss. 3 mL 0   sulfamethoxazole -trimethoprim  (BACTRIM  DS) 800-160 MG tablet Take 1 tablet by mouth 2 (two) times daily as directed. Start the day after surgery. 10 tablet 0   SUMAtriptan  (IMITREX ) 50 MG tablet TAKE 1 TABLET BY MOUTH AT ONSET OF HEADACHE, MAY REPEAT 1 DOSE AFTER 2 HOURS IF NEEDED 10 tablet 5   SUMAtriptan  (IMITREX ) 50 MG tablet Take 1 tablet by mouth at onset of headache, may repeat same dose once 2 hours later if needed 10 tablet 5   SUMAtriptan  (IMITREX ) 50 MG tablet Take 1 tablet (50 mg total) by mouth at the onset of headache. May repeat 1 dose after 2 hours if needed. 10 tablet 5   SUMAtriptan  (IMITREX ) 50 MG tablet Take 1 tablet (50  mg total) by mouth at onset of headache,may repeat 1 dose after 2 hours if needed, 2 to 3 times a week 9 tablet 6   terbinafine  (LAMISIL ) 250 MG tablet Take 1 tablet (250 mg total) by mouth daily for Tinea 14 tablet 0   traZODone  (DESYREL ) 50 MG tablet Take 50 mg by mouth at bedtime.     traZODone  (DESYREL ) 50 MG tablet TAKE 1 TO 2 TABLETS BY MOUTH AT BEDTIME 180 tablet 1   traZODone  (DESYREL ) 50 MG tablet Take 1-2 tablets (50-100 mg total) by mouth at bedtime. 180 tablet 1   traZODone  (DESYREL ) 50 MG tablet Take 1-2 tablets (50-100 mg total) by mouth at  bedtime. 180 tablet 2   WEGOVY  0.5 MG/0.5ML SOAJ Inject 0.5 mg into the skin once a week for weight loss. 2 mL 0   WEGOVY  1 MG/0.5ML SOAJ Inject 1 mg into the skin once a week for weight loss 2 mL 0   WEGOVY  2.4 MG/0.75ML SOAJ Inject 2.4 mg into the skin once a week for weight loss. 3 mL 0   WEGOVY  2.4 MG/0.75ML SOAJ Inject 2.4 mg into the skin once a week fro weight loss 3 mL 0   WEGOVY  2.4 MG/0.75ML SOAJ Inject 2.4 mg into the skin once a week for weight loss 3 mL 0   WEGOVY  2.4 MG/0.75ML SOAJ Inject 2.4 mg,sub-q, into the skin once a week for weight loss 3 mL 0   WEGOVY  2.4 MG/0.75ML SOAJ Inject 2.4 mg into the skin once a week for weight loss. 3 mL 0   WEGOVY  2.4 MG/0.75ML SOAJ Inject 2.4 mg into the skin once a week for weight loss. 3 mL 0   WEGOVY  2.4 MG/0.75ML SOAJ Inject 2.4 mg into the skin once a week for weight loss 3 mL 0   No current facility-administered medications for this visit.    Medication Side Effects: None  Allergies:  Allergies  Allergen Reactions   Cat Hair Extract Anaphylaxis   Brewers Yeast Rash    Past Medical History:  Diagnosis Date   Depression    takes Welbutrin and Fetzima    GERD (gastroesophageal reflux disease)    takes nexium    Past Medical History, Surgical history, Social history, and Family history were reviewed and updated as appropriate.   Please see review of systems for further details  on the patient's review from today.   Objective:   Physical Exam:  There were no vitals taken for this visit.  Physical Exam Constitutional:      General: She is not in acute distress. Musculoskeletal:        General: No deformity.  Neurological:     Mental Status: She is alert and oriented to person, place, and time.     Coordination: Coordination normal.  Psychiatric:        Attention and Perception: Attention and perception normal. She does not perceive auditory or visual hallucinations.        Mood and Affect: Affect is not labile, blunt, angry or inappropriate.        Speech: Speech normal.        Behavior: Behavior normal.        Thought Content: Thought content normal. Thought content is not paranoid or delusional. Thought content does not include homicidal or suicidal ideation. Thought content does not include homicidal or suicidal plan.        Cognition and Memory: Cognition and memory normal.        Judgment: Judgment normal.     Comments: Insight intact     Lab Review:     Component Value Date/Time   NA 140 09/01/2016 0852   K 4.4 09/01/2016 0852   CL 104 09/01/2016 0852   CO2 31 09/01/2016 0852   GLUCOSE 90 09/01/2016 0852   BUN 14 09/01/2016 0852   CREATININE 0.86 09/01/2016 0852   CALCIUM  9.2 09/01/2016 0852   PROT 7.0 09/01/2016 0852   ALBUMIN 4.3 09/01/2016 0852   AST 33 09/01/2016 0852   ALT 70 (H) 09/01/2016 0852   ALKPHOS 66 09/01/2016 0852   BILITOT 0.7 09/01/2016 0852       Component Value Date/Time  WBC 5.1 09/01/2016 0852   RBC 4.30 09/01/2016 0852   HGB 13.5 09/01/2016 0852   HCT 38.9 09/01/2016 0852   PLT 286.0 09/01/2016 0852   MCV 90.4 09/01/2016 0852   MCHC 34.6 09/01/2016 0852   RDW 11.9 09/01/2016 0852   LYMPHSABS 1.5 09/01/2016 0852   MONOABS 0.4 09/01/2016 0852   EOSABS 0.1 09/01/2016 0852   BASOSABS 0.0 09/01/2016 0852    No results found for: POCLITH, LITHIUM   No results found for: PHENYTOIN, PHENOBARB,  VALPROATE, CBMZ   .res Assessment: Plan:    Recommendations/Plan   Continue Spravato  treatment.   Patient advised to contact office with any questions, adverse effects, or acute worsening in signs and symptoms.   Diagnoses and all orders for this visit:  Recurrent major depression resistant to treatment [facility])     Please see After Visit Summary for patient specific instructions.  Future Appointments  Date Time Provider Department Center  10/08/2023  1:50 PM [facility]    No orders of the defined types were placed in this encounter.   -------------------------------

## 2023-08-21 NOTE — Progress Notes (Addendum)
 Specialty Pharmacy Refill Coordination Note  Robin Stevenson is a 47 y.o. female contacted today regarding refills of specialty medication(s) Esketamine HCl (Spravato  (84 MG Dose))   Patient requested Courier to Provider Office   Delivery date: 08/23/23   Verified address: [facility], ATTN: [name], [address], [address]   Medication will be filled on 08/23/23. *SAME DAY COURIER- SIGNATURE REQUIRED, ATTN: [name]*   [name] reports that the patient continues to tolerate therapy with no issues or concerns.  08/23/23- [place] SAME DAY, Signature required, Attn: [name]; REMs Authorization # [id number]

## 2023-08-23 ENCOUNTER — Other Ambulatory Visit: Payer: Self-pay

## 2023-08-23 ENCOUNTER — Other Ambulatory Visit (HOSPITAL_COMMUNITY): Payer: Self-pay

## 2023-08-23 NOTE — Progress Notes (Signed)
 08/23/23: Spravato  Same Day STAT (sig required) courier  Confirmation Number: [id number]

## 2023-08-28 ENCOUNTER — Other Ambulatory Visit (HOSPITAL_COMMUNITY): Payer: Self-pay

## 2023-08-28 ENCOUNTER — Encounter: Payer: Self-pay | Admitting: Adult Health

## 2023-08-28 ENCOUNTER — Ambulatory Visit (INDEPENDENT_AMBULATORY_CARE_PROVIDER_SITE_OTHER): Payer: Commercial Managed Care - PPO | Admitting: Adult Health

## 2023-08-28 ENCOUNTER — Ambulatory Visit: Payer: Commercial Managed Care - PPO

## 2023-08-28 ENCOUNTER — Other Ambulatory Visit: Payer: Self-pay

## 2023-08-28 VITALS — BP 141/98 | HR 72

## 2023-08-28 DIAGNOSIS — F339 Major depressive disorder, recurrent, unspecified: Secondary | ICD-10-CM | POA: Diagnosis not present

## 2023-08-28 NOTE — Progress Notes (Signed)
 [name] [id number] 1975-10-28 48 y.o.  Subjective:   Patient ID:  Robin Stevenson is a 48 y.o. (DOB May 16, 1976) female.  Chief Complaint: No chief complaint on file.   HPI Robin Stevenson presents to the office today for follow-up of treatment resistant depression (TRD).   Spravato  treatment    Patient was administered Spravato  84 mg intranasally today. Patient was observed by provider throughout Spravato  treatment. The patient experienced the typical dissociation which gradually resolved over the 2-hour period of observation. She did not feel sleepy, decreased feeling of sensitivity (numbness), lack of energy, increased blood pressure, feeling happy or very excited, or headache. She denied feeling disconnected from themself, their thoughts, feelings and things around them. Blood pressures remained within normal ranges at the 40-minute and 2-hour follow-up intervals. By the time the 2-hour observation period was met the patient was alert and oriented and able to exit without assistance. 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 See nursing note for further details.      Review of Systems:  Review of Systems  Musculoskeletal:  Negative for gait problem.  Neurological:  Negative for tremors.  Psychiatric/Behavioral:         Please refer to HPI    Medications: I have reviewed the patient's current medications.  Current Outpatient Medications  Medication Sig Dispense Refill   ALPRAZolam  (XANAX ) 0.5 MG tablet Take 1 tablet at bedtime the night before procedure. Take 1 tablet 1 hour before arrival. Bring third tablet with you to procedure. 3 tablet 0   atorvastatin  (LIPITOR) 20 MG tablet Take 1 tablet (20 mg total) by mouth daily. 90 tablet 3   atovaquone -proguanil (MALARONE ) 250-100 MG TABS tablet Take 1 tablet by mouth once daily begin 2 days prearrival,take daily during stay & continue for 7 days posttravel for  malaria prevention 24 tablet 0   azithromycin  (ZITHROMAX ) 500 MG tablet For non bloody diarrhea,take 2 tabs on day 1, if resolved,stop med,If diarrhea persists 1 tab on day 2 & 3. For bloody diarrhea,2 tabs on day 1 & 1 tab on day 2 & 3 4 tablet 0   buPROPion  (WELLBUTRIN  XL) 150 MG 24 hr tablet   3   buPROPion  (WELLBUTRIN  XL) 150 MG 24 hr tablet Take 1 tablet (150 mg total) by mouth every morning. 90 tablet 0   cariprazine  (VRAYLAR ) 1.5 MG capsule Take 1 capsule (1.5 mg total) by mouth daily. 28 capsule 0   celecoxib  (CELEBREX ) 200 MG capsule Take 1 capsule by mouth single dose as directed. Take 1 hour prior to arrival for surgery 1 capsule 0   Esketamine HCl, 56 MG Dose, (SPRAVATO , 56 MG DOSE,) 28 MG/DEVICE SOPK Dispense X 2 (28mg ) devices, administer on day 1 intranasally as directed 4 each 0   Esketamine HCl, 84 MG Dose, (SPRAVATO , 84 MG DOSE,) 28 MG/DEVICE SOPK Dispense X 3 (28 mg) devices for treatments twice a week administer intranasally 6 each 4   esomeprazole (NEXIUM) 20 MG capsule Take 20 mg by mouth once.     FETZIMA  120 MG CP24   3   fluocinonide  ointment (LIDEX ) 0.05 % Apply as directed to skin twice a day 30 g 0   gabapentin  (NEURONTIN ) 300 MG capsule Take 1 capsule (300 mg total) by mouth 3 (three) times daily. Take first dose 1 hour before arrival for surgery. 21 capsule 0   ketoconazole  (NIZORAL ) 2 % cream Apply a small amount to affected area on left foot/toe once  a day 30 g 0   Levomilnacipran  HCl ER (FETZIMA ) 120 MG CP24 Take 1 capsule by mouth daily. 90 capsule 3   meclizine  (ANTIVERT ) 25 MG tablet Take 1 tablet (25 mg total) by mouth as directed prior to treatment. 60 tablet 0   metFORMIN  (GLUCOPHAGE ) 500 MG tablet Take 1 tablet (500 mg total) by mouth daily. 90 tablet 1   metFORMIN  (GLUCOPHAGE ) 500 MG tablet Take 1 tablet (500 mg total) by mouth 2 (two) times daily. 180 tablet 1   metFORMIN  (GLUCOPHAGE ) 500 MG tablet Take 1 tablet (500 mg total) by mouth 2 (two) times daily.  90 tablet 1   metFORMIN  (GLUCOPHAGE -XR) 500 MG 24 hr tablet Take 1 tablet (500 mg total) by mouth daily. Please do not fill if not picked up within 30 days. 30 tablet 0   methocarbamol  (ROBAXIN ) 500 MG tablet Take 1 tablet (500 mg total) by mouth every 6 (six) hours as needed for muscle spasms. (Patient not taking: Reported on 05/22/2018) 45 tablet 0   ondansetron  (ZOFRAN ) 4 MG tablet Take 1 tablet (4 mg total) by mouth every 6 (six) hours for nausea. TAKE 4 tablets 15 minutes prior to arrival for surgery 30 tablet 0   ondansetron  (ZOFRAN ) 4 MG tablet Take 1 tablet  by mouth every 6 hours as needed for nausea 15 tablet 0   ondansetron  (ZOFRAN -ODT) 4 MG disintegrating tablet Dissolve 1 tablet (4 mg total) by mouth every 8 (eight) hours as needed for nausea or vomiting. 20 tablet 1   oxyCODONE  (OXY IR/ROXICODONE ) 5 MG immediate release tablet Take 1-2 tablet by mouth every six to eight hours as needed for Post Op Pain. DO NOT EXCEED  6 tablets in 24 hours. 30 tablet 0   polyethylene glycol (MIRALAX / GLYCOLAX) packet Take 17 g by mouth daily.     polyethylene glycol-electrolytes (NULYTELY) 420 g solution Take 4,000 mLs by mouth Nightly. 4000 mL 0   Semaglutide -Weight Management (WEGOVY ) 2.4 MG/0.75ML SOAJ Inject 2.4 mg into the skin once a week for weight loss. 3 mL 0   sulfamethoxazole -trimethoprim  (BACTRIM  DS) 800-160 MG tablet Take 1 tablet by mouth 2 (two) times daily as directed. Start the day after surgery. 10 tablet 0   SUMAtriptan  (IMITREX ) 50 MG tablet TAKE 1 TABLET BY MOUTH AT ONSET OF HEADACHE, MAY REPEAT 1 DOSE AFTER 2 HOURS IF NEEDED 10 tablet 5   SUMAtriptan  (IMITREX ) 50 MG tablet Take 1 tablet by mouth at onset of headache, may repeat same dose once 2 hours later if needed 10 tablet 5   SUMAtriptan  (IMITREX ) 50 MG tablet Take 1 tablet (50 mg total) by mouth at the onset of headache. May repeat 1 dose after 2 hours if needed. 10 tablet 5   SUMAtriptan  (IMITREX ) 50 MG tablet Take 1 tablet (50  mg total) by mouth at onset of headache,may repeat 1 dose after 2 hours if needed, 2 to 3 times a week 9 tablet 6   terbinafine  (LAMISIL ) 250 MG tablet Take 1 tablet (250 mg total) by mouth daily for Tinea 14 tablet 0   traZODone  (DESYREL ) 50 MG tablet Take 50 mg by mouth at bedtime.     traZODone  (DESYREL ) 50 MG tablet TAKE 1 TO 2 TABLETS BY MOUTH AT BEDTIME 180 tablet 1   traZODone  (DESYREL ) 50 MG tablet Take 1-2 tablets (50-100 mg total) by mouth at bedtime. 180 tablet 1   traZODone  (DESYREL ) 50 MG tablet Take 1-2 tablets (50-100 mg total) by mouth at  bedtime. 180 tablet 2   WEGOVY  0.5 MG/0.5ML SOAJ Inject 0.5 mg into the skin once a week for weight loss. 2 mL 0   WEGOVY  1 MG/0.5ML SOAJ Inject 1 mg into the skin once a week for weight loss 2 mL 0   WEGOVY  2.4 MG/0.75ML SOAJ Inject 2.4 mg into the skin once a week for weight loss. 3 mL 0   WEGOVY  2.4 MG/0.75ML SOAJ Inject 2.4 mg into the skin once a week fro weight loss 3 mL 0   WEGOVY  2.4 MG/0.75ML SOAJ Inject 2.4 mg into the skin once a week for weight loss 3 mL 0   WEGOVY  2.4 MG/0.75ML SOAJ Inject 2.4 mg,sub-q, into the skin once a week for weight loss 3 mL 0   WEGOVY  2.4 MG/0.75ML SOAJ Inject 2.4 mg into the skin once a week for weight loss. 3 mL 0   WEGOVY  2.4 MG/0.75ML SOAJ Inject 2.4 mg into the skin once a week for weight loss. 3 mL 0   WEGOVY  2.4 MG/0.75ML SOAJ Inject 2.4 mg into the skin once a week for weight loss 3 mL 0   No current facility-administered medications for this visit.    Medication Side Effects: None  Allergies:  Allergies  Allergen Reactions   Cat Hair Extract Anaphylaxis   Brewers Yeast Rash    Past Medical History:  Diagnosis Date   Depression    takes Welbutrin and Fetzima    GERD (gastroesophageal reflux disease)    takes nexium    Past Medical History, Surgical history, Social history, and Family history were reviewed and updated as appropriate.   Please see review of systems for further details  on the patient's review from today.   Objective:   Physical Exam:  There were no vitals taken for this visit.  Physical Exam Constitutional:      General: She is not in acute distress. Musculoskeletal:        General: No deformity.  Neurological:     Mental Status: She is alert and oriented to person, place, and time.     Coordination: Coordination normal.  Psychiatric:        Attention and Perception: Attention and perception normal. She does not perceive auditory or visual hallucinations.        Mood and Affect: Affect is not labile, blunt, angry or inappropriate.        Speech: Speech normal.        Behavior: Behavior normal.        Thought Content: Thought content normal. Thought content is not paranoid or delusional. Thought content does not include homicidal or suicidal ideation. Thought content does not include homicidal or suicidal plan.        Cognition and Memory: Cognition and memory normal.        Judgment: Judgment normal.     Comments: Insight intact     Lab Review:     Component Value Date/Time   NA 140 09/01/2016 0852   K 4.4 09/01/2016 0852   CL 104 09/01/2016 0852   CO2 31 09/01/2016 0852   GLUCOSE 90 09/01/2016 0852   BUN 14 09/01/2016 0852   CREATININE 0.86 09/01/2016 0852   CALCIUM  9.2 09/01/2016 0852   PROT 7.0 09/01/2016 0852   ALBUMIN 4.3 09/01/2016 0852   AST 33 09/01/2016 0852   ALT 70 (H) 09/01/2016 0852   ALKPHOS 66 09/01/2016 0852   BILITOT 0.7 09/01/2016 0852       Component Value Date/Time  WBC 5.1 09/01/2016 0852   RBC 4.30 09/01/2016 0852   HGB 13.5 09/01/2016 0852   HCT 38.9 09/01/2016 0852   PLT 286.0 09/01/2016 0852   MCV 90.4 09/01/2016 0852   MCHC 34.6 09/01/2016 0852   RDW 11.9 09/01/2016 0852   LYMPHSABS 1.5 09/01/2016 0852   MONOABS 0.4 09/01/2016 0852   EOSABS 0.1 09/01/2016 0852   BASOSABS 0.0 09/01/2016 0852    No results found for: POCLITH, LITHIUM   No results found for: PHENYTOIN, PHENOBARB,  VALPROATE, CBMZ   .res Assessment: Plan:    Recommendations/Plan   Continue Spravato  treatment.   Patient advised to contact office with any questions, adverse effects, or acute worsening in signs and symptoms.   Diagnoses and all orders for this visit:  Recurrent major depression resistant to treatment [facility])     Please see After Visit Summary for patient specific instructions.  Future Appointments  Date Time Provider Department Center  10/08/2023  1:50 PM [facility]    No orders of the defined types were placed in this encounter.   -------------------------------

## 2023-08-28 NOTE — Progress Notes (Signed)
 Specialty Pharmacy Refill Coordination Note  [name] is a 48 y.o. female contacted today regarding refills of specialty medication(s) Esketamine HCl (Spravato  (84 MG Dose))   Patient requested Courier to Provider Office   Delivery date: 08/31/23   Verified address: [facility], ATTN: [name], [address], [address]   Medication will be filled on 08/31/23. SAME DAY COURIER- SIGNATURE REQUIRED, ATTN: [name]*     [name] reports that the patient continues to tolerate therapy with no issues or concerns.

## 2023-08-28 NOTE — Progress Notes (Signed)
 NURSES NOTE:   Pt arrived for her # 16 Spravato  Treatment for treatment resistant depression, the starting dose was 56 mg (2 of the 28 mg nasal sprays) pt stayed at that dose for the first 4 treatments and also takes Zofran  and Meclizine  prior to arrival at office. Pt received 84 mg (3 of the 28 mg nasal sprays) total again today and did also premedicate. [facility] had been advised on her treatments and doses needed. Pt sees Angeline Sayers, NP and she will follow her throughout her treatments and follow ups.  Pt's Spravato  is ordered through [facility].  Spravato  medication is stored at treatment center per REMS/FDA guidelines. The medication is required to be locked behind two doors per REMS/FDA protocol. Medication is also disposed of properly after each use per regulations. All documentation for REMS is completed and submitted per FDA/REMS requirements.          Pt directed to treatment room, started vitals at 9:08 AM, 128/81, pulse 82. Pt did take Zofran  4 mg and Meclizine  25 mg prior to arriving at office, an hour ahead. Instructed patient to blow her nose if needed then recline back to a 45 degree angle if that was more comfortable for her. Gave patient first dose 28 mg nasal spray, administered in each nostril as directed and observed by nurse, waited 5 more minutes for the second and third doses. Placed a fan in her room and left her door open in case she did start to experience the extreme feeling of being hot.  After all doses given pt did not complain of any nausea/vomiting, pt did have a drink and some mints to help with the taste of Spravato  it gives the metallic taste. Her 40 minute check was at 10:00 AM, Vital signs were 150/90, pulse 72. Pulse ox 96%. Pt aware she would be monitored for a total time of 120 minutes.  Discharge vitals were taken at 12:00 PM 136/86, P 71, Pulse Ox 96%. Pt met with Angeline to discuss her treatment, pt will continue  twice a week for 4 week total with the prior approval resubmitted.  Pt advised to relax the rest of the day. No driving, no intense activities. Verbalized understanding. Pt reports no issues today. Nurse was with pt a total of 60 minutes for clinical assessment. Pt will plan on returning Thursday, January 9th. Pt instructed to call office with any problems or questions prior to next treatment.      LOT [id number] APR 2027

## 2023-08-29 ENCOUNTER — Other Ambulatory Visit: Payer: Self-pay | Admitting: Adult Health

## 2023-08-29 DIAGNOSIS — F331 Major depressive disorder, recurrent, moderate: Secondary | ICD-10-CM

## 2023-08-29 MED ORDER — BUPROPION HCL ER (XL) 150 MG PO TB24
150.0000 mg | ORAL_TABLET | ORAL | 0 refills | Status: DC
Start: 2023-08-29 — End: 2023-11-28
  Filled 2023-08-29: qty 90, 90d supply, fill #0

## 2023-08-30 ENCOUNTER — Other Ambulatory Visit: Payer: Self-pay

## 2023-08-30 ENCOUNTER — Encounter: Payer: Self-pay | Admitting: Adult Health

## 2023-08-30 ENCOUNTER — Ambulatory Visit: Payer: Commercial Managed Care - PPO | Admitting: Adult Health

## 2023-08-30 ENCOUNTER — Other Ambulatory Visit (HOSPITAL_COMMUNITY): Payer: Self-pay

## 2023-08-30 ENCOUNTER — Ambulatory Visit: Payer: Commercial Managed Care - PPO

## 2023-08-30 VITALS — BP 155/99 | HR 67

## 2023-08-30 DIAGNOSIS — F339 Major depressive disorder, recurrent, unspecified: Secondary | ICD-10-CM

## 2023-08-30 NOTE — Progress Notes (Signed)
 NURSES NOTE:   Pt arrived for her # 16 Spravato  Treatment for treatment resistant depression, the starting dose was 56 mg (2 of the 28 mg nasal sprays) pt stayed at that dose for the first 4 treatments and also takes Zofran  and Meclizine  prior to arrival at office. Pt received 84 mg (3 of the 28 mg nasal sprays) total again today and did also premedicate. [facility] had been advised on her treatments and doses needed. Pt sees Angeline Sayers, NP and she will follow her throughout her treatments and follow ups.  Pt's Spravato  is ordered through [facility].  Spravato  medication is stored at treatment center per REMS/FDA guidelines. The medication is required to be locked behind two doors per REMS/FDA protocol. Medication is also disposed of properly after each use per regulations. All documentation for REMS is completed and submitted per FDA/REMS requirements.          Pt directed to treatment room, started vitals at 10:10 AM, 142/92, pulse 79. Pt did take Zofran  4 mg and Meclizine  25 mg prior to arriving at office, an hour ahead. Instructed patient to blow her nose if needed then recline back to a 45 degree angle if that was more comfortable for her. Gave patient first dose 28 mg nasal spray, administered in each nostril as directed and observed by nurse, waited 5 more minutes for the second and third doses. Placed a fan in her room and left her door open in case she did start to experience the extreme feeling of being hot.  After all doses given pt did not complain of any nausea/vomiting, pt did have a drink and some mints to help with the taste of Spravato  it gives the metallic taste. Her 40 minute check was at 11:00 AM, Vital signs were 137/96, pulse 76. Pulse ox 96%. Pt aware she would be monitored for a total time of 120 minutes.  Discharge vitals were taken at 12:01 PM 155/99, P 67, Pulse Ox 96%. Pt met with Angeline to discuss her treatment, pt will continue  twice a week for 4 week total with the prior approval resubmitted.  Pt advised to relax the rest of the day. No driving, no intense activities. Verbalized understanding. Pt reports no issues today. Nurse was with pt a total of 60 minutes for clinical assessment. Pt will plan on returning Thursday, January 16th. Pt instructed to call office with any problems or questions prior to next treatment.      LOT [id number] APR 2027

## 2023-08-30 NOTE — Progress Notes (Signed)
 Due to weather concerns for 1/10 medication will be delivered 1/9 instead, [name] is aware.

## 2023-08-30 NOTE — Progress Notes (Signed)
 08/30/23- Same day courier, Signature required; REMS Authorization Code: [facility]

## 2023-08-30 NOTE — Progress Notes (Signed)
 Robin Stevenson [id number] 1975-10-28 48 y.o.  Subjective:   Patient ID:  Robin Stevenson is a 48 y.o. (DOB May 16, 1976) female.  Chief Complaint: No chief complaint on file.   HPI Robin Stevenson presents to the office today for follow-up of treatment resistant depression (TRD).   Spravato  treatment    Patient was administered Spravato  84 mg intranasally today. Patient was observed by provider throughout Spravato  treatment. The patient experienced the typical dissociation which gradually resolved over the 2-hour period of observation. She did not feel sleepy, decreased feeling of sensitivity (numbness), lack of energy, increased blood pressure, feeling happy or very excited, or headache. She denied feeling disconnected from themself, their thoughts, feelings and things around them. Blood pressures remained within normal ranges at the 40-minute and 2-hour follow-up intervals. By the time the 2-hour observation period was met the patient was alert and oriented and able to exit without assistance. 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 See nursing note for further details.      Review of Systems:  Review of Systems  Musculoskeletal:  Negative for gait problem.  Neurological:  Negative for tremors.  Psychiatric/Behavioral:         Please refer to HPI    Medications: I have reviewed the patient's current medications.  Current Outpatient Medications  Medication Sig Dispense Refill   ALPRAZolam  (XANAX ) 0.5 MG tablet Take 1 tablet at bedtime the night before procedure. Take 1 tablet 1 hour before arrival. Bring third tablet with you to procedure. 3 tablet 0   atorvastatin  (LIPITOR) 20 MG tablet Take 1 tablet (20 mg total) by mouth daily. 90 tablet 3   atovaquone -proguanil (MALARONE ) 250-100 MG TABS tablet Take 1 tablet by mouth once daily begin 2 days prearrival,take daily during stay & continue for 7 days posttravel for  malaria prevention 24 tablet 0   azithromycin  (ZITHROMAX ) 500 MG tablet For non bloody diarrhea,take 2 tabs on day 1, if resolved,stop med,If diarrhea persists 1 tab on day 2 & 3. For bloody diarrhea,2 tabs on day 1 & 1 tab on day 2 & 3 4 tablet 0   buPROPion  (WELLBUTRIN  XL) 150 MG 24 hr tablet   3   buPROPion  (WELLBUTRIN  XL) 150 MG 24 hr tablet Take 1 tablet (150 mg total) by mouth every morning. 90 tablet 0   cariprazine  (VRAYLAR ) 1.5 MG capsule Take 1 capsule (1.5 mg total) by mouth daily. 28 capsule 0   celecoxib  (CELEBREX ) 200 MG capsule Take 1 capsule by mouth single dose as directed. Take 1 hour prior to arrival for surgery 1 capsule 0   Esketamine HCl, 56 MG Dose, (SPRAVATO , 56 MG DOSE,) 28 MG/DEVICE SOPK Dispense X 2 (28mg ) devices, administer on day 1 intranasally as directed 4 each 0   Esketamine HCl, 84 MG Dose, (SPRAVATO , 84 MG DOSE,) 28 MG/DEVICE SOPK Dispense X 3 (28 mg) devices for treatments twice a week administer intranasally 6 each 4   esomeprazole (NEXIUM) 20 MG capsule Take 20 mg by mouth once.     FETZIMA  120 MG CP24   3   fluocinonide  ointment (LIDEX ) 0.05 % Apply as directed to skin twice a day 30 g 0   gabapentin  (NEURONTIN ) 300 MG capsule Take 1 capsule (300 mg total) by mouth 3 (three) times daily. Take first dose 1 hour before arrival for surgery. 21 capsule 0   ketoconazole  (NIZORAL ) 2 % cream Apply a small amount to affected area on left foot/toe once  a day 30 g 0   Levomilnacipran  HCl ER (FETZIMA ) 120 MG CP24 Take 1 capsule by mouth daily. 90 capsule 3   meclizine  (ANTIVERT ) 25 MG tablet Take 1 tablet (25 mg total) by mouth as directed prior to treatment. 60 tablet 0   metFORMIN  (GLUCOPHAGE ) 500 MG tablet Take 1 tablet (500 mg total) by mouth daily. 90 tablet 1   metFORMIN  (GLUCOPHAGE ) 500 MG tablet Take 1 tablet (500 mg total) by mouth 2 (two) times daily. 180 tablet 1   metFORMIN  (GLUCOPHAGE ) 500 MG tablet Take 1 tablet (500 mg total) by mouth 2 (two) times daily.  90 tablet 1   metFORMIN  (GLUCOPHAGE -XR) 500 MG 24 hr tablet Take 1 tablet (500 mg total) by mouth daily. Please do not fill if not picked up within 30 days. 30 tablet 0   methocarbamol  (ROBAXIN ) 500 MG tablet Take 1 tablet (500 mg total) by mouth every 6 (six) hours as needed for muscle spasms. (Patient not taking: Reported on 05/22/2018) 45 tablet 0   ondansetron  (ZOFRAN ) 4 MG tablet Take 1 tablet (4 mg total) by mouth every 6 (six) hours for nausea. TAKE 4 tablets 15 minutes prior to arrival for surgery 30 tablet 0   ondansetron  (ZOFRAN ) 4 MG tablet Take 1 tablet  by mouth every 6 hours as needed for nausea 15 tablet 0   ondansetron  (ZOFRAN -ODT) 4 MG disintegrating tablet Dissolve 1 tablet (4 mg total) by mouth every 8 (eight) hours as needed for nausea or vomiting. 20 tablet 1   oxyCODONE  (OXY IR/ROXICODONE ) 5 MG immediate release tablet Take 1-2 tablet by mouth every six to eight hours as needed for Post Op Pain. DO NOT EXCEED  6 tablets in 24 hours. 30 tablet 0   polyethylene glycol (MIRALAX / GLYCOLAX) packet Take 17 g by mouth daily.     polyethylene glycol-electrolytes (NULYTELY) 420 g solution Take 4,000 mLs by mouth Nightly. 4000 mL 0   Semaglutide -Weight Management (WEGOVY ) 2.4 MG/0.75ML SOAJ Inject 2.4 mg into the skin once a week for weight loss. 3 mL 0   sulfamethoxazole -trimethoprim  (BACTRIM  DS) 800-160 MG tablet Take 1 tablet by mouth 2 (two) times daily as directed. Start the day after surgery. 10 tablet 0   SUMAtriptan  (IMITREX ) 50 MG tablet TAKE 1 TABLET BY MOUTH AT ONSET OF HEADACHE, MAY REPEAT 1 DOSE AFTER 2 HOURS IF NEEDED 10 tablet 5   SUMAtriptan  (IMITREX ) 50 MG tablet Take 1 tablet by mouth at onset of headache, may repeat same dose once 2 hours later if needed 10 tablet 5   SUMAtriptan  (IMITREX ) 50 MG tablet Take 1 tablet (50 mg total) by mouth at the onset of headache. May repeat 1 dose after 2 hours if needed. 10 tablet 5   SUMAtriptan  (IMITREX ) 50 MG tablet Take 1 tablet (50  mg total) by mouth at onset of headache,may repeat 1 dose after 2 hours if needed, 2 to 3 times a week 9 tablet 6   terbinafine  (LAMISIL ) 250 MG tablet Take 1 tablet (250 mg total) by mouth daily for Tinea 14 tablet 0   traZODone  (DESYREL ) 50 MG tablet Take 50 mg by mouth at bedtime.     traZODone  (DESYREL ) 50 MG tablet TAKE 1 TO 2 TABLETS BY MOUTH AT BEDTIME 180 tablet 1   traZODone  (DESYREL ) 50 MG tablet Take 1-2 tablets (50-100 mg total) by mouth at bedtime. 180 tablet 1   traZODone  (DESYREL ) 50 MG tablet Take 1-2 tablets (50-100 mg total) by mouth at  bedtime. 180 tablet 2   WEGOVY  0.5 MG/0.5ML SOAJ Inject 0.5 mg into the skin once a week for weight loss. 2 mL 0   WEGOVY  1 MG/0.5ML SOAJ Inject 1 mg into the skin once a week for weight loss 2 mL 0   WEGOVY  2.4 MG/0.75ML SOAJ Inject 2.4 mg into the skin once a week for weight loss. 3 mL 0   WEGOVY  2.4 MG/0.75ML SOAJ Inject 2.4 mg into the skin once a week fro weight loss 3 mL 0   WEGOVY  2.4 MG/0.75ML SOAJ Inject 2.4 mg into the skin once a week for weight loss 3 mL 0   WEGOVY  2.4 MG/0.75ML SOAJ Inject 2.4 mg,sub-q, into the skin once a week for weight loss 3 mL 0   WEGOVY  2.4 MG/0.75ML SOAJ Inject 2.4 mg into the skin once a week for weight loss. 3 mL 0   WEGOVY  2.4 MG/0.75ML SOAJ Inject 2.4 mg into the skin once a week for weight loss. 3 mL 0   WEGOVY  2.4 MG/0.75ML SOAJ Inject 2.4 mg into the skin once a week for weight loss 3 mL 0   No current facility-administered medications for this visit.    Medication Side Effects: None  Allergies:  Allergies  Allergen Reactions   Cat Hair Extract Anaphylaxis   Brewers Yeast Rash    Past Medical History:  Diagnosis Date   Depression    takes Welbutrin and Fetzima    GERD (gastroesophageal reflux disease)    takes nexium    Past Medical History, Surgical history, Social history, and Family history were reviewed and updated as appropriate.   Please see review of systems for further details  on the patient's review from today.   Objective:   Physical Exam:  There were no vitals taken for this visit.  Physical Exam Constitutional:      General: She is not in acute distress. Musculoskeletal:        General: No deformity.  Neurological:     Mental Status: She is alert and oriented to person, place, and time.     Coordination: Coordination normal.  Psychiatric:        Attention and Perception: Attention and perception normal. She does not perceive auditory or visual hallucinations.        Mood and Affect: Affect is not labile, blunt, angry or inappropriate.        Speech: Speech normal.        Behavior: Behavior normal.        Thought Content: Thought content normal. Thought content is not paranoid or delusional. Thought content does not include homicidal or suicidal ideation. Thought content does not include homicidal or suicidal plan.        Cognition and Memory: Cognition and memory normal.        Judgment: Judgment normal.     Comments: Insight intact     Lab Review:     Component Value Date/Time   NA 140 09/01/2016 0852   K 4.4 09/01/2016 0852   CL 104 09/01/2016 0852   CO2 31 09/01/2016 0852   GLUCOSE 90 09/01/2016 0852   BUN 14 09/01/2016 0852   CREATININE 0.86 09/01/2016 0852   CALCIUM  9.2 09/01/2016 0852   PROT 7.0 09/01/2016 0852   ALBUMIN 4.3 09/01/2016 0852   AST 33 09/01/2016 0852   ALT 70 (H) 09/01/2016 0852   ALKPHOS 66 09/01/2016 0852   BILITOT 0.7 09/01/2016 0852       Component Value Date/Time  WBC 5.1 09/01/2016 0852   RBC 4.30 09/01/2016 0852   HGB 13.5 09/01/2016 0852   HCT 38.9 09/01/2016 0852   PLT 286.0 09/01/2016 0852   MCV 90.4 09/01/2016 0852   MCHC 34.6 09/01/2016 0852   RDW 11.9 09/01/2016 0852   LYMPHSABS 1.5 09/01/2016 0852   MONOABS 0.4 09/01/2016 0852   EOSABS 0.1 09/01/2016 0852   BASOSABS 0.0 09/01/2016 0852    No results found for: POCLITH, LITHIUM   No results found for: PHENYTOIN, PHENOBARB,  VALPROATE, CBMZ   .res Assessment: Plan:    Recommendations/Plan   Continue Spravato  treatment.   Patient advised to contact office with any questions, adverse effects, or acute worsening in signs and symptoms.   Diagnoses and all orders for this visit:  Recurrent major depression resistant to treatment [facility])     Please see After Visit Summary for patient specific instructions.  Future Appointments  Date Time Provider Department Center  10/08/2023  1:50 PM [facility]    No orders of the defined types were placed in this encounter.   -------------------------------

## 2023-09-04 ENCOUNTER — Other Ambulatory Visit: Payer: Self-pay

## 2023-09-04 NOTE — Progress Notes (Signed)
 Spoke with [name] regarding patient's Spravato  therapy. Patient cancelled today's appt (14th) but is still scheduled for Thursday, 1/16. Clinic will have 1 package of Spravato  remaining at the end of the week. Pharmacy will follow up with Clinic after Thursday appointment for upcoming week.

## 2023-09-06 ENCOUNTER — Ambulatory Visit: Payer: Commercial Managed Care - PPO | Admitting: Adult Health

## 2023-09-06 ENCOUNTER — Encounter: Payer: Self-pay | Admitting: Adult Health

## 2023-09-06 ENCOUNTER — Ambulatory Visit: Payer: Commercial Managed Care - PPO

## 2023-09-06 ENCOUNTER — Other Ambulatory Visit (HOSPITAL_COMMUNITY): Payer: Self-pay

## 2023-09-06 VITALS — BP 139/89 | HR 68

## 2023-09-06 DIAGNOSIS — F339 Major depressive disorder, recurrent, unspecified: Secondary | ICD-10-CM

## 2023-09-06 NOTE — Progress Notes (Signed)
NURSES NOTE:   Pt arrived for her # 86 Spravato Treatment for treatment resistant depression, the starting dose was 56 mg (2 of the 28 mg nasal sprays) pt stayed at that dose for the first 4 treatments and also takes Zofran and Meclizine prior to arrival at office. Pt received 84 mg (3 of the 28 mg nasal sprays) total again today and did also premedicate. [facility] had been advised on her treatments and doses needed. Pt sees [name], NP and she will follow her throughout her treatments and follow ups.  Pt's Spravato is ordered through [facility].  Spravato medication is stored at treatment center per REMS/FDA guidelines. The medication is required to be locked behind two doors per REMS/FDA protocol. Medication is also disposed of properly after each use per regulations. All documentation for REMS is completed and submitted per FDA/REMS requirements.          Pt directed to treatment room, started vitals at 10:00 AM, 142/82, pulse 76. Pt did take Zofran 4 mg and Meclizine 25 mg prior to arriving at office, an hour ahead. Instructed patient to blow her nose if needed then recline back to a 45 degree angle if that was more comfortable for her. Gave patient first dose 28 mg nasal spray, administered in each nostril as directed and observed by nurse, waited 5 more minutes for the second and third doses. Placed a fan in her room and left her door open in case she did start to experience the extreme feeling of being hot.  After all doses given pt did not complain of any nausea/vomiting, pt did have a drink and some mints to help with the taste of Spravato it gives the metallic taste. Her 40 minute check was at 10:45 AM, Vital signs were 153/95, pulse 69. Pulse ox 96%. Pt aware she would be monitored for a total time of 120 minutes.  Discharge vitals were taken at 11:55 AM 139/89, P 68, Pulse Ox 96%. Pt met with Rene Kocher to discuss her treatment, pt will continue  twice a week for 4 week total with the prior approval resubmitted.  Pt advised to relax the rest of the day. No driving, no intense activities. Verbalized understanding. Pt reports no issues today. Nurse was with pt a total of 60 minutes for clinical assessment. Pt will plan on returning next Tuesday January 20th. Pt instructed to call office with any problems or questions prior to next treatment.      LOT [id number] APR 2027

## 2023-09-06 NOTE — Progress Notes (Signed)
Robin Stevenson [id number] 29-Aug-1975 48 y.o.  Subjective:   Patient ID:  Robin Stevenson is a 48 y.o. (DOB 1975/08/24) female.  Chief Complaint: No chief complaint on file.   HPI Robin Stevenson presents to the office today for follow-up of treatment resistant depression (TRD).   Spravato treatment    Patient was administered Spravato 84 mg intranasally today. Patient was observed by provider throughout [facility] treatment. The patient experienced the typical dissociation which gradually resolved over the 2-hour period of observation. She did not feel sleepy, decreased feeling of sensitivity (numbness), lack of energy, increased blood pressure, feeling happy or very excited, or headache. She denied feeling disconnected from themself, their thoughts, feelings and things around them. Blood pressures remained within normal ranges at the 40-minute and 2-hour follow-up intervals. By the time the 2-hour observation period was met the patient was alert and oriented and able to exit without assistance. Patient willing to continue Spravato administration for the treatment of resistant depression. See nursing note for further details.      Review of Systems:  Review of Systems  Musculoskeletal:  Negative for gait problem.  Neurological:  Negative for tremors.  Psychiatric/Behavioral:         Please refer to HPI    Medications: I have reviewed the patient's current medications.  Current Outpatient Medications  Medication Sig Dispense Refill   ALPRAZolam (XANAX) 0.5 MG tablet Take 1 tablet at bedtime the night before procedure. Take 1 tablet 1 hour before arrival. Bring third tablet with you to procedure. 3 tablet 0   atorvastatin (LIPITOR) 20 MG tablet Take 1 tablet (20 mg total) by mouth daily. 90 tablet 3   atovaquone-proguanil (MALARONE) 250-100 MG TABS tablet Take 1 tablet by mouth once daily begin 2 days prearrival,take daily during stay & continue for 7 days posttravel for  malaria prevention 24 tablet 0   azithromycin (ZITHROMAX) 500 MG tablet For non bloody diarrhea,take 2 tabs on day 1, if resolved,stop med,If diarrhea persists 1 tab on day 2 & 3. For bloody diarrhea,2 tabs on day 1 & 1 tab on day 2 & 3 4 tablet 0   buPROPion (WELLBUTRIN XL) 150 MG 24 hr tablet   3   buPROPion (WELLBUTRIN XL) 150 MG 24 hr tablet Take 1 tablet (150 mg total) by mouth every morning. 90 tablet 0   cariprazine (VRAYLAR) 1.5 MG capsule Take 1 capsule (1.5 mg total) by mouth daily. 28 capsule 0   celecoxib (CELEBREX) 200 MG capsule Take 1 capsule by mouth single dose as directed. Take 1 hour prior to arrival for surgery 1 capsule 0   Esketamine HCl, 56 MG Dose, (SPRAVATO, 56 MG DOSE,) 28 MG/DEVICE SOPK Dispense X 2 (28mg ) devices, administer on day 1 intranasally as directed 4 each 0   Esketamine HCl, 84 MG Dose, (SPRAVATO, 84 MG DOSE,) 28 MG/DEVICE SOPK Dispense X 3 (28 mg) devices for treatments twice a week administer intranasally 6 each 4   esomeprazole (NEXIUM) 20 MG capsule Take 20 mg by mouth once.     FETZIMA 120 MG CP24   3   fluocinonide ointment (LIDEX) 0.05 % Apply as directed to skin twice a day 30 g 0   gabapentin (NEURONTIN) 300 MG capsule Take 1 capsule (300 mg total) by mouth 3 (three) times daily. Take first dose 1 hour before arrival for surgery. 21 capsule 0   ketoconazole (NIZORAL) 2 % cream Apply a small amount to affected area on left foot/toe once  a day 30 g 0   Levomilnacipran HCl ER (FETZIMA) 120 MG CP24 Take 1 capsule by mouth daily. 90 capsule 3   meclizine (ANTIVERT) 25 MG tablet Take 1 tablet (25 mg total) by mouth as directed prior to treatment. 60 tablet 0   metFORMIN (GLUCOPHAGE) 500 MG tablet Take 1 tablet (500 mg total) by mouth daily. 90 tablet 1   metFORMIN (GLUCOPHAGE) 500 MG tablet Take 1 tablet (500 mg total) by mouth 2 (two) times daily. 180 tablet 1   metFORMIN (GLUCOPHAGE) 500 MG tablet Take 1 tablet (500 mg total) by mouth 2 (two) times daily.  90 tablet 1   metFORMIN (GLUCOPHAGE-XR) 500 MG 24 hr tablet Take 1 tablet (500 mg total) by mouth daily. Please do not fill if not picked up within 30 days. 30 tablet 0   methocarbamol (ROBAXIN) 500 MG tablet Take 1 tablet (500 mg total) by mouth every 6 (six) hours as needed for muscle spasms. (Patient not taking: Reported on 05/22/2018) 45 tablet 0   ondansetron (ZOFRAN) 4 MG tablet Take 1 tablet (4 mg total) by mouth every 6 (six) hours for nausea. TAKE 4 tablets 15 minutes prior to arrival for surgery 30 tablet 0   ondansetron (ZOFRAN) 4 MG tablet Take 1 tablet  by mouth every 6 hours as needed for nausea 15 tablet 0   ondansetron (ZOFRAN-ODT) 4 MG disintegrating tablet Dissolve 1 tablet (4 mg total) by mouth every 8 (eight) hours as needed for nausea or vomiting. 20 tablet 1   oxyCODONE (OXY IR/ROXICODONE) 5 MG immediate release tablet Take 1-2 tablet by mouth every six to eight hours as needed for Post Op Pain. DO NOT EXCEED  6 tablets in 24 hours. 30 tablet 0   polyethylene glycol (MIRALAX / GLYCOLAX) packet Take 17 g by mouth daily.     polyethylene glycol-electrolytes (NULYTELY) 420 g solution Take 4,000 mLs by mouth Nightly. 4000 mL 0   Semaglutide-Weight Management (WEGOVY) 2.4 MG/0.75ML SOAJ Inject 2.4 mg into the skin once a week for weight loss. 3 mL 0   sulfamethoxazole-trimethoprim (BACTRIM DS) 800-160 MG tablet Take 1 tablet by mouth 2 (two) times daily as directed. Start the day after surgery. 10 tablet 0   SUMAtriptan (IMITREX) 50 MG tablet TAKE 1 TABLET BY MOUTH AT ONSET OF HEADACHE, MAY REPEAT 1 DOSE AFTER 2 HOURS IF NEEDED 10 tablet 5   SUMAtriptan (IMITREX) 50 MG tablet Take 1 tablet by mouth at onset of headache, may repeat same dose once 2 hours later if needed 10 tablet 5   SUMAtriptan (IMITREX) 50 MG tablet Take 1 tablet (50 mg total) by mouth at the onset of headache. May repeat 1 dose after 2 hours if needed. 10 tablet 5   SUMAtriptan (IMITREX) 50 MG tablet Take 1 tablet (50  mg total) by mouth at onset of headache,may repeat 1 dose after 2 hours if needed, 2 to 3 times a week 9 tablet 6   terbinafine (LAMISIL) 250 MG tablet Take 1 tablet (250 mg total) by mouth daily for Tinea 14 tablet 0   traZODone (DESYREL) 50 MG tablet Take 50 mg by mouth at bedtime.     traZODone (DESYREL) 50 MG tablet TAKE 1 TO 2 TABLETS BY MOUTH AT BEDTIME 180 tablet 1   traZODone (DESYREL) 50 MG tablet Take 1-2 tablets (50-100 mg total) by mouth at bedtime. 180 tablet 1   traZODone (DESYREL) 50 MG tablet Take 1-2 tablets (50-100 mg total) by mouth at  bedtime. 180 tablet 2   WEGOVY 0.5 MG/0.5ML SOAJ Inject 0.5 mg into the skin once a week for weight loss. 2 mL 0   WEGOVY 1 MG/0.5ML SOAJ Inject 1 mg into the skin once a week for weight loss 2 mL 0   WEGOVY 2.4 MG/0.75ML SOAJ Inject 2.4 mg into the skin once a week for weight loss. 3 mL 0   WEGOVY 2.4 MG/0.75ML SOAJ Inject 2.4 mg into the skin once a week fro weight loss 3 mL 0   WEGOVY 2.4 MG/0.75ML SOAJ Inject 2.4 mg into the skin once a week for weight loss 3 mL 0   WEGOVY 2.4 MG/0.75ML SOAJ Inject 2.4 mg,sub-q, into the skin once a week for weight loss 3 mL 0   WEGOVY 2.4 MG/0.75ML SOAJ Inject 2.4 mg into the skin once a week for weight loss. 3 mL 0   WEGOVY 2.4 MG/0.75ML SOAJ Inject 2.4 mg into the skin once a week for weight loss. 3 mL 0   WEGOVY 2.4 MG/0.75ML SOAJ Inject 2.4 mg into the skin once a week for weight loss 3 mL 0   No current facility-administered medications for this visit.    Medication Side Effects: None  Allergies:  Allergies  Allergen Reactions   Cat Dander Anaphylaxis   Brewers Yeast Rash    Past Medical History:  Diagnosis Date   Depression    takes Welbutrin and Fetzima   GERD (gastroesophageal reflux disease)    takes nexium    Past Medical History, Surgical history, Social history, and Family history were reviewed and updated as appropriate.   Please see review of systems for further details on the  patient's review from today.   Objective:   Physical Exam:  There were no vitals taken for this visit.  Physical Exam Constitutional:      General: She is not in acute distress. Musculoskeletal:        General: No deformity.  Neurological:     Mental Status: She is alert and oriented to person, place, and time.     Coordination: Coordination normal.  Psychiatric:        Attention and Perception: Attention and perception normal. She does not perceive auditory or visual hallucinations.        Mood and Affect: Affect is not labile, blunt, angry or inappropriate.        Speech: Speech normal.        Behavior: Behavior normal.        Thought Content: Thought content normal. Thought content is not paranoid or delusional. Thought content does not include homicidal or suicidal ideation. Thought content does not include homicidal or suicidal plan.        Cognition and Memory: Cognition and memory normal.        Judgment: Judgment normal.     Comments: Insight intact     Lab Review:     Component Value Date/Time   NA 140 09/01/2016 0852   K 4.4 09/01/2016 0852   CL 104 09/01/2016 0852   CO2 31 09/01/2016 0852   GLUCOSE 90 09/01/2016 0852   BUN 14 09/01/2016 0852   CREATININE 0.86 09/01/2016 0852   CALCIUM 9.2 09/01/2016 0852   PROT 7.0 09/01/2016 0852   ALBUMIN 4.3 09/01/2016 0852   AST 33 09/01/2016 0852   ALT 70 (H) 09/01/2016 0852   ALKPHOS 66 09/01/2016 0852   BILITOT 0.7 09/01/2016 0852       Component Value Date/Time   WBC  5.1 09/01/2016 0852   RBC 4.30 09/01/2016 0852   HGB 13.5 09/01/2016 0852   HCT 38.9 09/01/2016 0852   PLT 286.0 09/01/2016 0852   MCV 90.4 09/01/2016 0852   MCHC 34.6 09/01/2016 0852   RDW 11.9 09/01/2016 0852   LYMPHSABS 1.5 09/01/2016 0852   MONOABS 0.4 09/01/2016 0852   EOSABS 0.1 09/01/2016 0852   BASOSABS 0.0 09/01/2016 0852    No results found for: "POCLITH", "LITHIUM"   No results found for: "PHENYTOIN", "PHENOBARB", "VALPROATE",  "CBMZ"   .res Assessment: Plan:   Recommendations/Plan   Continue Spravato treatment.   Patient advised to contact office with any questions, adverse effects, or acute worsening in signs and symptoms.  Diagnoses and all orders for this visit:  Recurrent major depression resistant to treatment [facility])     Please see After Visit Summary for patient specific instructions.  Future Appointments  Date Time Provider Department Center  10/08/2023  1:50 PM [facility]    No orders of the defined types were placed in this encounter.   -------------------------------

## 2023-09-11 ENCOUNTER — Other Ambulatory Visit: Payer: Self-pay

## 2023-09-11 ENCOUNTER — Other Ambulatory Visit (HOSPITAL_COMMUNITY): Payer: Self-pay

## 2023-09-11 ENCOUNTER — Ambulatory Visit: Payer: Commercial Managed Care - PPO

## 2023-09-11 ENCOUNTER — Encounter: Payer: Self-pay | Admitting: Adult Health

## 2023-09-11 ENCOUNTER — Ambulatory Visit: Payer: Commercial Managed Care - PPO | Admitting: Adult Health

## 2023-09-11 VITALS — BP 131/92 | HR 80

## 2023-09-11 DIAGNOSIS — F339 Major depressive disorder, recurrent, unspecified: Secondary | ICD-10-CM

## 2023-09-11 MED ORDER — SPRAVATO (84 MG DOSE) 28 MG/DEVICE NA SOPK
PACK | NASAL | 3 refills | Status: AC
Start: 2023-09-11 — End: ?
  Filled 2023-09-11: qty 3, 7d supply, fill #0
  Filled 2023-09-14: qty 3, 7d supply, fill #1
  Filled 2023-09-20: qty 3, 7d supply, fill #2
  Filled 2023-09-27: qty 3, 7d supply, fill #3

## 2023-09-11 NOTE — Progress Notes (Signed)
AJAE STFLEUR [id number] [birth year] 48 y.o.  Subjective:   Patient ID:  Robin Stevenson is a 48 y.o. (DOB 03-13-1976) female.  Chief Complaint: No chief complaint on file.   HPI RENETTA OSBY presents to the office today for follow-up of follow-up of treatment resistant depression (TRD).   Spravato treatment    Patient was administered Spravato 84 mg intranasally today. Patient was observed by provider throughout [facility] treatment. The patient experienced the typical dissociation which gradually resolved over the 2-hour period of observation. She did not feel sleepy, decreased feeling of sensitivity (numbness), lack of energy, increased blood pressure, feeling happy or very excited, or headache. She denied feeling disconnected from themself, their thoughts, feelings and things around them. Blood pressures remained within normal ranges at the 40-minute and 2-hour follow-up intervals. By the time the 2-hour observation period was met the patient was alert and oriented and able to exit without assistance. Patient willing to continue Spravato administration for the treatment of resistant depression. See nursing note for further details.      Review of Systems:  Review of Systems  Musculoskeletal:  Negative for gait problem.  Neurological:  Negative for tremors.  Psychiatric/Behavioral:         Please refer to HPI    Medications: I have reviewed the patient's current medications.  Current Outpatient Medications  Medication Sig Dispense Refill   ALPRAZolam (XANAX) 0.5 MG tablet Take 1 tablet at bedtime the night before procedure. Take 1 tablet 1 hour before arrival. Bring third tablet with you to procedure. 3 tablet 0   atorvastatin (LIPITOR) 20 MG tablet Take 1 tablet (20 mg total) by mouth daily. 90 tablet 3   atovaquone-proguanil (MALARONE) 250-100 MG TABS tablet Take 1 tablet by mouth once daily begin 2 days prearrival,take daily during stay & continue for 7 days  posttravel for malaria prevention 24 tablet 0   azithromycin (ZITHROMAX) 500 MG tablet For non bloody diarrhea,take 2 tabs on day 1, if resolved,stop med,If diarrhea persists 1 tab on day 2 & 3. For bloody diarrhea,2 tabs on day 1 & 1 tab on day 2 & 3 4 tablet 0   buPROPion (WELLBUTRIN XL) 150 MG 24 hr tablet   3   buPROPion (WELLBUTRIN XL) 150 MG 24 hr tablet Take 1 tablet (150 mg total) by mouth every morning. 90 tablet 0   cariprazine (VRAYLAR) 1.5 MG capsule Take 1 capsule (1.5 mg total) by mouth daily. 28 capsule 0   celecoxib (CELEBREX) 200 MG capsule Take 1 capsule by mouth single dose as directed. Take 1 hour prior to arrival for surgery 1 capsule 0   Esketamine HCl, 56 MG Dose, (SPRAVATO, 56 MG DOSE,) 28 MG/DEVICE SOPK Dispense X 2 (28mg ) devices, administer on day 1 intranasally as directed 4 each 0   Esketamine HCl, 84 MG Dose, (SPRAVATO, 84 MG DOSE,) 28 MG/DEVICE SOPK Dispense X 3 (28 mg) devices for treatments twice a week administer intranasally 6 each 4   esomeprazole (NEXIUM) 20 MG capsule Take 20 mg by mouth once.     FETZIMA 120 MG CP24   3   fluocinonide ointment (LIDEX) 0.05 % Apply as directed to skin twice a day 30 g 0   gabapentin (NEURONTIN) 300 MG capsule Take 1 capsule (300 mg total) by mouth 3 (three) times daily. Take first dose 1 hour before arrival for surgery. 21 capsule 0   ketoconazole (NIZORAL) 2 % cream Apply a small amount to affected area on left  foot/toe once a day 30 g 0   Levomilnacipran HCl ER (FETZIMA) 120 MG CP24 Take 1 capsule by mouth daily. 90 capsule 3   meclizine (ANTIVERT) 25 MG tablet Take 1 tablet (25 mg total) by mouth as directed prior to treatment. 60 tablet 0   metFORMIN (GLUCOPHAGE) 500 MG tablet Take 1 tablet (500 mg total) by mouth daily. 90 tablet 1   metFORMIN (GLUCOPHAGE) 500 MG tablet Take 1 tablet (500 mg total) by mouth 2 (two) times daily. 180 tablet 1   metFORMIN (GLUCOPHAGE) 500 MG tablet Take 1 tablet (500 mg total) by mouth 2 (two)  times daily. 90 tablet 1   metFORMIN (GLUCOPHAGE-XR) 500 MG 24 hr tablet Take 1 tablet (500 mg total) by mouth daily. Please do not fill if not picked up within 30 days. 30 tablet 0   methocarbamol (ROBAXIN) 500 MG tablet Take 1 tablet (500 mg total) by mouth every 6 (six) hours as needed for muscle spasms. (Patient not taking: Reported on 05/22/2018) 45 tablet 0   ondansetron (ZOFRAN) 4 MG tablet Take 1 tablet (4 mg total) by mouth every 6 (six) hours for nausea. TAKE 4 tablets 15 minutes prior to arrival for surgery 30 tablet 0   ondansetron (ZOFRAN) 4 MG tablet Take 1 tablet  by mouth every 6 hours as needed for nausea 15 tablet 0   ondansetron (ZOFRAN-ODT) 4 MG disintegrating tablet Dissolve 1 tablet (4 mg total) by mouth every 8 (eight) hours as needed for nausea or vomiting. 20 tablet 1   oxyCODONE (OXY IR/ROXICODONE) 5 MG immediate release tablet Take 1-2 tablet by mouth every six to eight hours as needed for Post Op Pain. DO NOT EXCEED  6 tablets in 24 hours. 30 tablet 0   polyethylene glycol (MIRALAX / GLYCOLAX) packet Take 17 g by mouth daily.     polyethylene glycol-electrolytes (NULYTELY) 420 g solution Take 4,000 mLs by mouth Nightly. 4000 mL 0   Semaglutide-Weight Management (WEGOVY) 2.4 MG/0.75ML SOAJ Inject 2.4 mg into the skin once a week for weight loss. 3 mL 0   sulfamethoxazole-trimethoprim (BACTRIM DS) 800-160 MG tablet Take 1 tablet by mouth 2 (two) times daily as directed. Start the day after surgery. 10 tablet 0   SUMAtriptan (IMITREX) 50 MG tablet TAKE 1 TABLET BY MOUTH AT ONSET OF HEADACHE, MAY REPEAT 1 DOSE AFTER 2 HOURS IF NEEDED 10 tablet 5   SUMAtriptan (IMITREX) 50 MG tablet Take 1 tablet by mouth at onset of headache, may repeat same dose once 2 hours later if needed 10 tablet 5   SUMAtriptan (IMITREX) 50 MG tablet Take 1 tablet (50 mg total) by mouth at the onset of headache. May repeat 1 dose after 2 hours if needed. 10 tablet 5   SUMAtriptan (IMITREX) 50 MG tablet Take  1 tablet (50 mg total) by mouth at onset of headache,may repeat 1 dose after 2 hours if needed, 2 to 3 times a week 9 tablet 6   terbinafine (LAMISIL) 250 MG tablet Take 1 tablet (250 mg total) by mouth daily for Tinea 14 tablet 0   traZODone (DESYREL) 50 MG tablet Take 50 mg by mouth at bedtime.     traZODone (DESYREL) 50 MG tablet TAKE 1 TO 2 TABLETS BY MOUTH AT BEDTIME 180 tablet 1   traZODone (DESYREL) 50 MG tablet Take 1-2 tablets (50-100 mg total) by mouth at bedtime. 180 tablet 1   traZODone (DESYREL) 50 MG tablet Take 1-2 tablets (50-100 mg total) by  mouth at bedtime. 180 tablet 2   WEGOVY 0.5 MG/0.5ML SOAJ Inject 0.5 mg into the skin once a week for weight loss. 2 mL 0   WEGOVY 1 MG/0.5ML SOAJ Inject 1 mg into the skin once a week for weight loss 2 mL 0   WEGOVY 2.4 MG/0.75ML SOAJ Inject 2.4 mg into the skin once a week for weight loss. 3 mL 0   WEGOVY 2.4 MG/0.75ML SOAJ Inject 2.4 mg into the skin once a week fro weight loss 3 mL 0   WEGOVY 2.4 MG/0.75ML SOAJ Inject 2.4 mg into the skin once a week for weight loss 3 mL 0   WEGOVY 2.4 MG/0.75ML SOAJ Inject 2.4 mg,sub-q, into the skin once a week for weight loss 3 mL 0   WEGOVY 2.4 MG/0.75ML SOAJ Inject 2.4 mg into the skin once a week for weight loss. 3 mL 0   WEGOVY 2.4 MG/0.75ML SOAJ Inject 2.4 mg into the skin once a week for weight loss. 3 mL 0   WEGOVY 2.4 MG/0.75ML SOAJ Inject 2.4 mg into the skin once a week for weight loss 3 mL 0   No current facility-administered medications for this visit.    Medication Side Effects: None  Allergies:  Allergies  Allergen Reactions   Cat Dander Anaphylaxis   Brewers Yeast Rash    Past Medical History:  Diagnosis Date   Depression    takes Welbutrin and Fetzima   GERD (gastroesophageal reflux disease)    takes nexium    Past Medical History, Surgical history, Social history, and Family history were reviewed and updated as appropriate.   Please see review of systems for further  details on the patient's review from today.   Objective:   Physical Exam:  There were no vitals taken for this visit.  Physical Exam Constitutional:      General: She is not in acute distress. Musculoskeletal:        General: No deformity.  Neurological:     Mental Status: She is alert and oriented to person, place, and time.     Coordination: Coordination normal.  Psychiatric:        Attention and Perception: Attention and perception normal. She does not perceive auditory or visual hallucinations.        Mood and Affect: Affect is not labile, blunt, angry or inappropriate.        Speech: Speech normal.        Behavior: Behavior normal.        Thought Content: Thought content normal. Thought content is not paranoid or delusional. Thought content does not include homicidal or suicidal ideation. Thought content does not include homicidal or suicidal plan.        Cognition and Memory: Cognition and memory normal.        Judgment: Judgment normal.     Comments: Insight intact     Lab Review:     Component Value Date/Time   NA 140 09/01/2016 0852   K 4.4 09/01/2016 0852   CL 104 09/01/2016 0852   CO2 31 09/01/2016 0852   GLUCOSE 90 09/01/2016 0852   BUN 14 09/01/2016 0852   CREATININE 0.86 09/01/2016 0852   CALCIUM 9.2 09/01/2016 0852   PROT 7.0 09/01/2016 0852   ALBUMIN 4.3 09/01/2016 0852   AST 33 09/01/2016 0852   ALT 70 (H) 09/01/2016 0852   ALKPHOS 66 09/01/2016 0852   BILITOT 0.7 09/01/2016 0852       Component Value Date/Time  WBC 5.1 09/01/2016 0852   RBC 4.30 09/01/2016 0852   HGB 13.5 09/01/2016 0852   HCT 38.9 09/01/2016 0852   PLT 286.0 09/01/2016 0852   MCV 90.4 09/01/2016 0852   MCHC 34.6 09/01/2016 0852   RDW 11.9 09/01/2016 0852   LYMPHSABS 1.5 09/01/2016 0852   MONOABS 0.4 09/01/2016 0852   EOSABS 0.1 09/01/2016 0852   BASOSABS 0.0 09/01/2016 0852    No results found for: "POCLITH", "LITHIUM"   No results found for: "PHENYTOIN",  "PHENOBARB", "VALPROATE", "CBMZ"   .res Assessment: Plan:    Recommendations/Plan   Continue Spravato treatment.   Patient advised to contact office with any questions, adverse effects, or acute worsening in signs and symptoms.   There are no diagnoses linked to this encounter.   Please see After Visit Summary for patient specific instructions.  Future Appointments  Date Time Provider Department Center  10/09/2023  8:20 AM [facility]    No orders of the defined types were placed in this encounter.   -------------------------------

## 2023-09-11 NOTE — Progress Notes (Signed)
NURSES NOTE:   Pt arrived for her # 78 Spravato Treatment for treatment resistant depression, the starting dose was 56 mg (2 of the 28 mg nasal sprays) pt stayed at that dose for the first 4 treatments and also takes Zofran and Meclizine prior to arrival at office. Pt received 84 mg (3 of the 28 mg nasal sprays) total again today and did also premedicate. [facility] had been advised on her treatments and doses needed. Pt sees [name], NP and she will follow her throughout her treatments and follow ups.  Pt's Spravato is ordered through [facility].  Spravato medication is stored at treatment center per REMS/FDA guidelines. The medication is required to be locked behind two doors per REMS/FDA protocol. Medication is also disposed of properly after each use per regulations. All documentation for REMS is completed and submitted per FDA/REMS requirements.          Pt directed to treatment room, started vitals at 10:05 AM, 127/78, pulse 76. Pt did take Zofran 4 mg and Meclizine 25 mg prior to arriving at office, an hour ahead. Instructed patient to blow her nose if needed then recline back to a 45 degree angle if that was more comfortable for her. Gave patient first dose 28 mg nasal spray, administered in each nostril as directed and observed by nurse, waited 5 more minutes for the second and third doses. Placed a fan in her room and left her door open in case she did start to experience the extreme feeling of being hot.  After all doses given pt did not complain of any nausea/vomiting, pt did have a drink and some mints to help with the taste of Spravato it gives the metallic taste. Her 40 minute check was at 10:55 AM, Vital signs were 144/93, pulse 71. Pulse ox 96%. Pt aware she would be monitored for a total time of 120 minutes.  Discharge vitals were taken at 12:00 PM 131/92, P 80, Pulse Ox 96%. Pt met with Rene Kocher to discuss her treatment, pt will continue  twice a week for 4 week total with the prior approval resubmitted.  Pt advised to relax the rest of the day. No driving, no intense activities. Verbalized understanding. Pt reports no issues today. Nurse was with pt a total of 60 minutes for clinical assessment. Pt will plan on returning Thursday 23rd. Pt instructed to call office with any problems or questions prior to next treatment.      LOT [id number] APR 2027

## 2023-09-11 NOTE — Progress Notes (Signed)
Specialty Pharmacy Refill Coordination Note  Robin Stevenson is a 48 y.o. female contacted today regarding refills of specialty medication(s) Esketamine HCl (Spravato (84 MG Dose))   Patient requested Courier to Provider Office   Delivery date: 09/12/23   Verified address: [facility] (ATTN: [name]) [address], [address], [place], [place] [address]   Medication will be filled on 09/12/23. SAME DAY COURIER, SIGNATURE REQUIRED    Patient is now on once weekly dosing.    [name], RN, reports that patient is doing well on therapy with no issues.     REMs information will be added on the dispense date.

## 2023-09-12 ENCOUNTER — Other Ambulatory Visit: Payer: Self-pay

## 2023-09-12 ENCOUNTER — Other Ambulatory Visit (HOSPITAL_COMMUNITY): Payer: Self-pay

## 2023-09-13 ENCOUNTER — Ambulatory Visit: Payer: Commercial Managed Care - PPO

## 2023-09-13 ENCOUNTER — Other Ambulatory Visit: Payer: Self-pay

## 2023-09-13 ENCOUNTER — Ambulatory Visit (INDEPENDENT_AMBULATORY_CARE_PROVIDER_SITE_OTHER): Payer: Commercial Managed Care - PPO | Admitting: Adult Health

## 2023-09-13 ENCOUNTER — Encounter: Payer: Self-pay | Admitting: Adult Health

## 2023-09-13 VITALS — BP 137/97 | HR 69

## 2023-09-13 DIAGNOSIS — F339 Major depressive disorder, recurrent, unspecified: Secondary | ICD-10-CM

## 2023-09-13 NOTE — Progress Notes (Signed)
Robin Stevenson [id number] Sep 12, 1975 48 y.o.  Subjective:   Patient ID:  Robin Stevenson is a 48 y.o. (DOB April 17, 1976) female.  Chief Complaint: No chief complaint on file.   HPI Robin Stevenson presents to the office today for follow-up of treatment resistant depression (TRD).   Spravato treatment    Patient was administered Spravato 84 mg intranasally today. Patient was observed by provider throughout [facility] treatment. The patient experienced the typical dissociation which gradually resolved over the 2-hour period of observation. She did not feel sleepy, decreased feeling of sensitivity (numbness), lack of energy, increased blood pressure, feeling happy or very excited, or headache. She denied feeling disconnected from themself, their thoughts, feelings and things around them. Blood pressures remained within normal ranges at the 40-minute and 2-hour follow-up intervals. By the time the 2-hour observation period was met the patient was alert and oriented and able to exit without assistance. Patient willing to continue Spravato administration for the treatment of resistant depression. See nursing note for further details.        Review of Systems:  Review of Systems  Musculoskeletal:  Negative for gait problem.  Neurological:  Negative for tremors.  Psychiatric/Behavioral:         Please refer to HPI    Medications: I have reviewed the patient's current medications.  Current Outpatient Medications  Medication Sig Dispense Refill   ALPRAZolam (XANAX) 0.5 MG tablet Take 1 tablet at bedtime the night before procedure. Take 1 tablet 1 hour before arrival. Bring third tablet with you to procedure. 3 tablet 0   atorvastatin (LIPITOR) 20 MG tablet Take 1 tablet (20 mg total) by mouth daily. 90 tablet 3   atovaquone-proguanil (MALARONE) 250-100 MG TABS tablet Take 1 tablet by mouth once daily begin 2 days prearrival,take daily during stay & continue for 7 days posttravel for  malaria prevention 24 tablet 0   azithromycin (ZITHROMAX) 500 MG tablet For non bloody diarrhea,take 2 tabs on day 1, if resolved,stop med,If diarrhea persists 1 tab on day 2 & 3. For bloody diarrhea,2 tabs on day 1 & 1 tab on day 2 & 3 4 tablet 0   buPROPion (WELLBUTRIN XL) 150 MG 24 hr tablet   3   buPROPion (WELLBUTRIN XL) 150 MG 24 hr tablet Take 1 tablet (150 mg total) by mouth every morning. 90 tablet 0   cariprazine (VRAYLAR) 1.5 MG capsule Take 1 capsule (1.5 mg total) by mouth daily. 28 capsule 0   celecoxib (CELEBREX) 200 MG capsule Take 1 capsule by mouth single dose as directed. Take 1 hour prior to arrival for surgery 1 capsule 0   Esketamine HCl, 56 MG Dose, (SPRAVATO, 56 MG DOSE,) 28 MG/DEVICE SOPK Dispense X 2 (28mg ) devices, administer on day 1 intranasally as directed 4 each 0   Esketamine HCl, 84 MG Dose, (SPRAVATO, 84 MG DOSE,) 28 MG/DEVICE SOPK Dispense X 3 (28 mg) devices for treatments every 7 days, administer intranasally 3 each 3   esomeprazole (NEXIUM) 20 MG capsule Take 20 mg by mouth once.     FETZIMA 120 MG CP24   3   fluocinonide ointment (LIDEX) 0.05 % Apply as directed to skin twice a day 30 g 0   gabapentin (NEURONTIN) 300 MG capsule Take 1 capsule (300 mg total) by mouth 3 (three) times daily. Take first dose 1 hour before arrival for surgery. 21 capsule 0   ketoconazole (NIZORAL) 2 % cream Apply a small amount to affected area on left  foot/toe once a day 30 g 0   Levomilnacipran HCl ER (FETZIMA) 120 MG CP24 Take 1 capsule by mouth daily. 90 capsule 3   meclizine (ANTIVERT) 25 MG tablet Take 1 tablet (25 mg total) by mouth as directed prior to treatment. 60 tablet 0   metFORMIN (GLUCOPHAGE) 500 MG tablet Take 1 tablet (500 mg total) by mouth daily. 90 tablet 1   metFORMIN (GLUCOPHAGE) 500 MG tablet Take 1 tablet (500 mg total) by mouth 2 (two) times daily. 180 tablet 1   metFORMIN (GLUCOPHAGE) 500 MG tablet Take 1 tablet (500 mg total) by mouth 2 (two) times daily.  90 tablet 1   metFORMIN (GLUCOPHAGE-XR) 500 MG 24 hr tablet Take 1 tablet (500 mg total) by mouth daily. Please do not fill if not picked up within 30 days. 30 tablet 0   methocarbamol (ROBAXIN) 500 MG tablet Take 1 tablet (500 mg total) by mouth every 6 (six) hours as needed for muscle spasms. (Patient not taking: Reported on 05/22/2018) 45 tablet 0   ondansetron (ZOFRAN) 4 MG tablet Take 1 tablet (4 mg total) by mouth every 6 (six) hours for nausea. TAKE 4 tablets 15 minutes prior to arrival for surgery 30 tablet 0   ondansetron (ZOFRAN) 4 MG tablet Take 1 tablet  by mouth every 6 hours as needed for nausea 15 tablet 0   ondansetron (ZOFRAN-ODT) 4 MG disintegrating tablet Dissolve 1 tablet (4 mg total) by mouth every 8 (eight) hours as needed for nausea or vomiting. 20 tablet 1   oxyCODONE (OXY IR/ROXICODONE) 5 MG immediate release tablet Take 1-2 tablet by mouth every six to eight hours as needed for Post Op Pain. DO NOT EXCEED  6 tablets in 24 hours. 30 tablet 0   polyethylene glycol (MIRALAX / GLYCOLAX) packet Take 17 g by mouth daily.     polyethylene glycol-electrolytes (NULYTELY) 420 g solution Take 4,000 mLs by mouth Nightly. 4000 mL 0   Semaglutide-Weight Management (WEGOVY) 2.4 MG/0.75ML SOAJ Inject 2.4 mg into the skin once a week for weight loss. 3 mL 0   sulfamethoxazole-trimethoprim (BACTRIM DS) 800-160 MG tablet Take 1 tablet by mouth 2 (two) times daily as directed. Start the day after surgery. 10 tablet 0   SUMAtriptan (IMITREX) 50 MG tablet TAKE 1 TABLET BY MOUTH AT ONSET OF HEADACHE, MAY REPEAT 1 DOSE AFTER 2 HOURS IF NEEDED 10 tablet 5   SUMAtriptan (IMITREX) 50 MG tablet Take 1 tablet by mouth at onset of headache, may repeat same dose once 2 hours later if needed 10 tablet 5   SUMAtriptan (IMITREX) 50 MG tablet Take 1 tablet (50 mg total) by mouth at the onset of headache. May repeat 1 dose after 2 hours if needed. 10 tablet 5   SUMAtriptan (IMITREX) 50 MG tablet Take 1 tablet (50  mg total) by mouth at onset of headache,may repeat 1 dose after 2 hours if needed, 2 to 3 times a week 9 tablet 6   terbinafine (LAMISIL) 250 MG tablet Take 1 tablet (250 mg total) by mouth daily for Tinea 14 tablet 0   traZODone (DESYREL) 50 MG tablet Take 50 mg by mouth at bedtime.     traZODone (DESYREL) 50 MG tablet TAKE 1 TO 2 TABLETS BY MOUTH AT BEDTIME 180 tablet 1   traZODone (DESYREL) 50 MG tablet Take 1-2 tablets (50-100 mg total) by mouth at bedtime. 180 tablet 1   traZODone (DESYREL) 50 MG tablet Take 1-2 tablets (50-100 mg total) by  mouth at bedtime. 180 tablet 2   WEGOVY 0.5 MG/0.5ML SOAJ Inject 0.5 mg into the skin once a week for weight loss. 2 mL 0   WEGOVY 1 MG/0.5ML SOAJ Inject 1 mg into the skin once a week for weight loss 2 mL 0   WEGOVY 2.4 MG/0.75ML SOAJ Inject 2.4 mg into the skin once a week for weight loss. 3 mL 0   WEGOVY 2.4 MG/0.75ML SOAJ Inject 2.4 mg into the skin once a week fro weight loss 3 mL 0   WEGOVY 2.4 MG/0.75ML SOAJ Inject 2.4 mg into the skin once a week for weight loss 3 mL 0   WEGOVY 2.4 MG/0.75ML SOAJ Inject 2.4 mg,sub-q, into the skin once a week for weight loss 3 mL 0   WEGOVY 2.4 MG/0.75ML SOAJ Inject 2.4 mg into the skin once a week for weight loss. 3 mL 0   WEGOVY 2.4 MG/0.75ML SOAJ Inject 2.4 mg into the skin once a week for weight loss. 3 mL 0   WEGOVY 2.4 MG/0.75ML SOAJ Inject 2.4 mg into the skin once a week for weight loss 3 mL 0   No current facility-administered medications for this visit.    Medication Side Effects: None  Allergies:  Allergies  Allergen Reactions   Cat Dander Anaphylaxis   Brewers Yeast Rash    Past Medical History:  Diagnosis Date   Depression    takes Welbutrin and Fetzima   GERD (gastroesophageal reflux disease)    takes nexium    Past Medical History, Surgical history, Social history, and Family history were reviewed and updated as appropriate.   Please see review of systems for further details on the  patient's review from today.   Objective:   Physical Exam:  There were no vitals taken for this visit.  Physical Exam Constitutional:      General: She is not in acute distress. Musculoskeletal:        General: No deformity.  Neurological:     Mental Status: She is alert and oriented to person, place, and time.     Coordination: Coordination normal.  Psychiatric:        Attention and Perception: Attention and perception normal. She does not perceive auditory or visual hallucinations.        Mood and Affect: Affect is not labile, blunt, angry or inappropriate.        Speech: Speech normal.        Behavior: Behavior normal.        Thought Content: Thought content normal. Thought content is not paranoid or delusional. Thought content does not include homicidal or suicidal ideation. Thought content does not include homicidal or suicidal plan.        Cognition and Memory: Cognition and memory normal.        Judgment: Judgment normal.     Comments: Insight intact     Lab Review:     Component Value Date/Time   NA 140 09/01/2016 0852   K 4.4 09/01/2016 0852   CL 104 09/01/2016 0852   CO2 31 09/01/2016 0852   GLUCOSE 90 09/01/2016 0852   BUN 14 09/01/2016 0852   CREATININE 0.86 09/01/2016 0852   CALCIUM 9.2 09/01/2016 0852   PROT 7.0 09/01/2016 0852   ALBUMIN 4.3 09/01/2016 0852   AST 33 09/01/2016 0852   ALT 70 (H) 09/01/2016 0852   ALKPHOS 66 09/01/2016 0852   BILITOT 0.7 09/01/2016 0852       Component Value Date/Time  WBC 5.1 09/01/2016 0852   RBC 4.30 09/01/2016 0852   HGB 13.5 09/01/2016 0852   HCT 38.9 09/01/2016 0852   PLT 286.0 09/01/2016 0852   MCV 90.4 09/01/2016 0852   MCHC 34.6 09/01/2016 0852   RDW 11.9 09/01/2016 0852   LYMPHSABS 1.5 09/01/2016 0852   MONOABS 0.4 09/01/2016 0852   EOSABS 0.1 09/01/2016 0852   BASOSABS 0.0 09/01/2016 0852    No results found for: "POCLITH", "LITHIUM"   No results found for: "PHENYTOIN", "PHENOBARB", "VALPROATE",  "CBMZ"   .res Assessment: Plan:    Recommendations/Plan   Continue Spravato treatment.   Patient advised to contact office with any questions, adverse effects, or acute worsening in signs and symptoms.   Diagnoses and all orders for this visit:  Recurrent major depression resistant to treatment [facility])     Please see After Visit Summary for patient specific instructions.  Future Appointments  Date Time Provider Department Center  10/09/2023  8:20 AM [facility]    No orders of the defined types were placed in this encounter.   -------------------------------

## 2023-09-13 NOTE — Progress Notes (Signed)
NURSES NOTE:   Pt arrived for her # 20 Spravato Treatment for treatment resistant depression, the starting dose was 56 mg (2 of the 28 mg nasal sprays) pt stayed at that dose for the first 4 treatments and also takes Zofran and Meclizine prior to arrival at office. Pt received 84 mg (3 of the 28 mg nasal sprays) total again today and did also premedicate. [facility] had been advised on her treatments and doses needed. Pt sees [name], NP and she will follow her throughout her treatments and follow ups.  Pt's Spravato is ordered through [facility].  Spravato medication is stored at treatment center per REMS/FDA guidelines. The medication is required to be locked behind two doors per REMS/FDA protocol. Medication is also disposed of properly after each use per regulations. All documentation for REMS is completed and submitted per FDA/REMS requirements.          Pt directed to treatment room, started vitals at 9:56 AM, 136/89, pulse 78. Pt did take Zofran 4 mg and Meclizine 25 mg prior to arriving at office, an hour ahead. Instructed patient to blow her nose if needed then recline back to a 45 degree angle if that was more comfortable for her. Gave patient first dose 28 mg nasal spray, administered in each nostril as directed and observed by nurse, waited 5 more minutes for the second and third doses. Placed a fan in her room and left her door open in case she did start to experience the extreme feeling of being hot.  After all doses given pt did not complain of any nausea/vomiting, pt did have a drink and some mints to help with the taste of Spravato it gives the metallic taste. Her 40 minute check was at 10:38 AM, Vital signs were 155/97, pulse 71. Pulse ox 96%. Pt aware she would be monitored for a total time of 120 minutes.  Discharge vitals were taken at 12:00 PM 137/97, P 69, Pulse Ox 96%. Pt met with Rene Kocher to discuss her treatment, pt will start once a  week treatments next week. Pt advised to relax the rest of the day. No driving, no intense activities. Verbalized understanding. Pt reports no issues today. Nurse was with pt a total of 60 minutes for clinical assessment. Pt will plan on returning Thursday 30th. Pt instructed to call office with any problems or questions prior to next treatment.      LOT [id number] 2027

## 2023-09-14 ENCOUNTER — Other Ambulatory Visit: Payer: Self-pay

## 2023-09-14 NOTE — Progress Notes (Signed)
Spravato shipped same day courier 09/12/23.   Signature Required.   DEA [id number] Authorization code: [id number] (generated on 1/24 for fullfillment on 1/22)

## 2023-09-14 NOTE — Progress Notes (Signed)
Specialty Pharmacy Refill Coordination Note  Robin Stevenson is a 48 y.o. female assessed today regarding refills of clinic administered specialty medication(s) Esketamine HCl (Spravato (84 MG Dose))   Clinic requested Courier to Provider Office   Delivery date: 09/18/23   Verified address: [facility] (ATTN: [name]) [address], [place], [place] [address]   Medication will be filled on 09/18/23.   Spoke with [name], next appt 1/30. Will follow up end of next week for following week delivery.

## 2023-09-17 ENCOUNTER — Other Ambulatory Visit: Payer: Self-pay

## 2023-09-18 ENCOUNTER — Other Ambulatory Visit: Payer: Self-pay

## 2023-09-18 NOTE — Progress Notes (Signed)
09/18/23 CA: Spravato  Same day courier and signature required. Tracking number [id number].

## 2023-09-18 NOTE — Progress Notes (Signed)
Dispensed Spravato 1 box 84mg   Same day Courier Signature Required 09/18/23  Healthcare DEA: [id number]  Authorization code: [id number]

## 2023-09-19 ENCOUNTER — Other Ambulatory Visit (HOSPITAL_COMMUNITY): Payer: Self-pay

## 2023-09-20 ENCOUNTER — Other Ambulatory Visit (HOSPITAL_COMMUNITY): Payer: Self-pay

## 2023-09-20 ENCOUNTER — Ambulatory Visit: Payer: Commercial Managed Care - PPO

## 2023-09-20 ENCOUNTER — Encounter: Payer: Self-pay | Admitting: Adult Health

## 2023-09-20 ENCOUNTER — Ambulatory Visit (INDEPENDENT_AMBULATORY_CARE_PROVIDER_SITE_OTHER): Payer: Commercial Managed Care - PPO | Admitting: Adult Health

## 2023-09-20 VITALS — BP 142/94 | HR 69

## 2023-09-20 DIAGNOSIS — F339 Major depressive disorder, recurrent, unspecified: Secondary | ICD-10-CM

## 2023-09-20 NOTE — Progress Notes (Signed)
Robin Stevenson [id number] [birth year] 48 y.o.  Subjective:   Patient ID:  Robin Stevenson is a 48 y.o. (DOB [birth year]) female.  Chief Complaint: No chief complaint on file.   HPI Robin Stevenson presents to the office today for follow-up of treatment resistant depression (TRD).   Spravato treatment    Patient was administered Spravato 84 mg intranasally today. Patient was observed by provider throughout [facility] treatment. The patient experienced the typical dissociation which gradually resolved over the 2-hour period of observation. She did not feel sleepy, decreased feeling of sensitivity (numbness), lack of energy, increased blood pressure, feeling happy or very excited, or headache. She denied feeling disconnected from themself, their thoughts, feelings and things around them. Blood pressures remained within normal ranges at the 40-minute and 2-hour follow-up intervals. By the time the 2-hour observation period was met the patient was alert and oriented and able to exit without assistance. Patient willing to continue Spravato administration for the treatment of resistant depression. See nursing note for further details.        Review of Systems:  Review of Systems  Musculoskeletal:  Negative for gait problem.  Neurological:  Negative for tremors.  Psychiatric/Behavioral:         Please refer to HPI    Medications: I have reviewed the patient's current medications.  Current Outpatient Medications  Medication Sig Dispense Refill   ALPRAZolam (XANAX) 0.5 MG tablet Take 1 tablet at bedtime the night before procedure. Take 1 tablet 1 hour before arrival. Bring third tablet with you to procedure. 3 tablet 0   atorvastatin (LIPITOR) 20 MG tablet Take 1 tablet (20 mg total) by mouth daily. 90 tablet 3   atovaquone-proguanil (MALARONE) 250-100 MG TABS tablet Take 1 tablet by mouth once daily begin 2 days prearrival,take daily during stay & continue for 7 days posttravel for  malaria prevention 24 tablet 0   azithromycin (ZITHROMAX) 500 MG tablet For non bloody diarrhea,take 2 tabs on day 1, if resolved,stop med,If diarrhea persists 1 tab on day 2 & 3. For bloody diarrhea,2 tabs on day 1 & 1 tab on day 2 & 3 4 tablet 0   buPROPion (WELLBUTRIN XL) 150 MG 24 hr tablet   3   buPROPion (WELLBUTRIN XL) 150 MG 24 hr tablet Take 1 tablet (150 mg total) by mouth every morning. 90 tablet 0   cariprazine (VRAYLAR) 1.5 MG capsule Take 1 capsule (1.5 mg total) by mouth daily. 28 capsule 0   celecoxib (CELEBREX) 200 MG capsule Take 1 capsule by mouth single dose as directed. Take 1 hour prior to arrival for surgery 1 capsule 0   Esketamine HCl, 56 MG Dose, (SPRAVATO, 56 MG DOSE,) 28 MG/DEVICE SOPK Dispense X 2 (28mg ) devices, administer on day 1 intranasally as directed 4 each 0   Esketamine HCl, 84 MG Dose, (SPRAVATO, 84 MG DOSE,) 28 MG/DEVICE SOPK Dispense X 3 (28 mg) devices for treatments every 7 days, administer intranasally 3 each 3   esomeprazole (NEXIUM) 20 MG capsule Take 20 mg by mouth once.     FETZIMA 120 MG CP24   3   fluocinonide ointment (LIDEX) 0.05 % Apply as directed to skin twice a day 30 g 0   gabapentin (NEURONTIN) 300 MG capsule Take 1 capsule (300 mg total) by mouth 3 (three) times daily. Take first dose 1 hour before arrival for surgery. 21 capsule 0   ketoconazole (NIZORAL) 2 % cream Apply a small amount to affected area on left  foot/toe once a day 30 g 0   Levomilnacipran HCl ER (FETZIMA) 120 MG CP24 Take 1 capsule by mouth daily. 90 capsule 3   meclizine (ANTIVERT) 25 MG tablet Take 1 tablet (25 mg total) by mouth as directed prior to treatment. 60 tablet 0   metFORMIN (GLUCOPHAGE) 500 MG tablet Take 1 tablet (500 mg total) by mouth daily. 90 tablet 1   metFORMIN (GLUCOPHAGE) 500 MG tablet Take 1 tablet (500 mg total) by mouth 2 (two) times daily. 180 tablet 1   metFORMIN (GLUCOPHAGE) 500 MG tablet Take 1 tablet (500 mg total) by mouth 2 (two) times daily.  90 tablet 1   metFORMIN (GLUCOPHAGE-XR) 500 MG 24 hr tablet Take 1 tablet (500 mg total) by mouth daily. Please do not fill if not picked up within 30 days. 30 tablet 0   methocarbamol (ROBAXIN) 500 MG tablet Take 1 tablet (500 mg total) by mouth every 6 (six) hours as needed for muscle spasms. (Patient not taking: Reported on 05/22/2018) 45 tablet 0   ondansetron (ZOFRAN) 4 MG tablet Take 1 tablet (4 mg total) by mouth every 6 (six) hours for nausea. TAKE 4 tablets 15 minutes prior to arrival for surgery 30 tablet 0   ondansetron (ZOFRAN) 4 MG tablet Take 1 tablet  by mouth every 6 hours as needed for nausea 15 tablet 0   ondansetron (ZOFRAN-ODT) 4 MG disintegrating tablet Dissolve 1 tablet (4 mg total) by mouth every 8 (eight) hours as needed for nausea or vomiting. 20 tablet 1   oxyCODONE (OXY IR/ROXICODONE) 5 MG immediate release tablet Take 1-2 tablet by mouth every six to eight hours as needed for Post Op Pain. DO NOT EXCEED  6 tablets in 24 hours. 30 tablet 0   polyethylene glycol (MIRALAX / GLYCOLAX) packet Take 17 g by mouth daily.     polyethylene glycol-electrolytes (NULYTELY) 420 g solution Take 4,000 mLs by mouth Nightly. 4000 mL 0   Semaglutide-Weight Management (WEGOVY) 2.4 MG/0.75ML SOAJ Inject 2.4 mg into the skin once a week for weight loss. 3 mL 0   sulfamethoxazole-trimethoprim (BACTRIM DS) 800-160 MG tablet Take 1 tablet by mouth 2 (two) times daily as directed. Start the day after surgery. 10 tablet 0   SUMAtriptan (IMITREX) 50 MG tablet TAKE 1 TABLET BY MOUTH AT ONSET OF HEADACHE, MAY REPEAT 1 DOSE AFTER 2 HOURS IF NEEDED 10 tablet 5   SUMAtriptan (IMITREX) 50 MG tablet Take 1 tablet by mouth at onset of headache, may repeat same dose once 2 hours later if needed 10 tablet 5   SUMAtriptan (IMITREX) 50 MG tablet Take 1 tablet (50 mg total) by mouth at the onset of headache. May repeat 1 dose after 2 hours if needed. 10 tablet 5   SUMAtriptan (IMITREX) 50 MG tablet Take 1 tablet (50  mg total) by mouth at onset of headache,may repeat 1 dose after 2 hours if needed, 2 to 3 times a week 9 tablet 6   terbinafine (LAMISIL) 250 MG tablet Take 1 tablet (250 mg total) by mouth daily for Tinea 14 tablet 0   traZODone (DESYREL) 50 MG tablet Take 50 mg by mouth at bedtime.     traZODone (DESYREL) 50 MG tablet TAKE 1 TO 2 TABLETS BY MOUTH AT BEDTIME 180 tablet 1   traZODone (DESYREL) 50 MG tablet Take 1-2 tablets (50-100 mg total) by mouth at bedtime. 180 tablet 1   traZODone (DESYREL) 50 MG tablet Take 1-2 tablets (50-100 mg total) by  mouth at bedtime. 180 tablet 2   WEGOVY 0.5 MG/0.5ML SOAJ Inject 0.5 mg into the skin once a week for weight loss. 2 mL 0   WEGOVY 1 MG/0.5ML SOAJ Inject 1 mg into the skin once a week for weight loss 2 mL 0   WEGOVY 2.4 MG/0.75ML SOAJ Inject 2.4 mg into the skin once a week for weight loss. 3 mL 0   WEGOVY 2.4 MG/0.75ML SOAJ Inject 2.4 mg into the skin once a week fro weight loss 3 mL 0   WEGOVY 2.4 MG/0.75ML SOAJ Inject 2.4 mg into the skin once a week for weight loss 3 mL 0   WEGOVY 2.4 MG/0.75ML SOAJ Inject 2.4 mg,sub-q, into the skin once a week for weight loss 3 mL 0   WEGOVY 2.4 MG/0.75ML SOAJ Inject 2.4 mg into the skin once a week for weight loss. 3 mL 0   WEGOVY 2.4 MG/0.75ML SOAJ Inject 2.4 mg into the skin once a week for weight loss. 3 mL 0   WEGOVY 2.4 MG/0.75ML SOAJ Inject 2.4 mg into the skin once a week for weight loss 3 mL 0   No current facility-administered medications for this visit.    Medication Side Effects: None  Allergies:  Allergies  Allergen Reactions   Cat Dander Anaphylaxis   Brewers Yeast Rash    Past Medical History:  Diagnosis Date   Depression    takes Welbutrin and Fetzima   GERD (gastroesophageal reflux disease)    takes nexium    Past Medical History, Surgical history, Social history, and Family history were reviewed and updated as appropriate.   Please see review of systems for further details on the  patient's review from today.   Objective:   Physical Exam:  There were no vitals taken for this visit.  Physical Exam Constitutional:      General: She is not in acute distress. Musculoskeletal:        General: No deformity.  Neurological:     Mental Status: She is alert and oriented to person, place, and time.     Coordination: Coordination normal.  Psychiatric:        Attention and Perception: Attention and perception normal. She does not perceive auditory or visual hallucinations.        Mood and Affect: Affect is not labile, blunt, angry or inappropriate.        Speech: Speech normal.        Behavior: Behavior normal.        Thought Content: Thought content normal. Thought content is not paranoid or delusional. Thought content does not include homicidal or suicidal ideation. Thought content does not include homicidal or suicidal plan.        Cognition and Memory: Cognition and memory normal.        Judgment: Judgment normal.     Comments: Insight intact     Lab Review:     Component Value Date/Time   NA 140 09/01/2016 0852   K 4.4 09/01/2016 0852   CL 104 09/01/2016 0852   CO2 31 09/01/2016 0852   GLUCOSE 90 09/01/2016 0852   BUN 14 09/01/2016 0852   CREATININE 0.86 09/01/2016 0852   CALCIUM 9.2 09/01/2016 0852   PROT 7.0 09/01/2016 0852   ALBUMIN 4.3 09/01/2016 0852   AST 33 09/01/2016 0852   ALT 70 (H) 09/01/2016 0852   ALKPHOS 66 09/01/2016 0852   BILITOT 0.7 09/01/2016 0852       Component Value Date/Time  WBC 5.1 09/01/2016 0852   RBC 4.30 09/01/2016 0852   HGB 13.5 09/01/2016 0852   HCT 38.9 09/01/2016 0852   PLT 286.0 09/01/2016 0852   MCV 90.4 09/01/2016 0852   MCHC 34.6 09/01/2016 0852   RDW 11.9 09/01/2016 0852   LYMPHSABS 1.5 09/01/2016 0852   MONOABS 0.4 09/01/2016 0852   EOSABS 0.1 09/01/2016 0852   BASOSABS 0.0 09/01/2016 0852    No results found for: "POCLITH", "LITHIUM"   No results found for: "PHENYTOIN", "PHENOBARB", "VALPROATE",  "CBMZ"   .res Assessment: Plan:    Recommendations/Plan   Continue Spravato treatment.   Patient advised to contact office with any questions, adverse effects, or acute worsening in signs and symptoms.   Diagnoses and all orders for this visit:  Recurrent major depression resistant to treatment [facility])     Please see After Visit Summary for patient specific instructions.  Future Appointments  Date Time Provider Department Center  10/09/2023  8:20 AM [facility]    No orders of the defined types were placed in this encounter.   -------------------------------

## 2023-09-20 NOTE — Progress Notes (Signed)
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 Pt received 84 mg (3 of the 28 mg nasal sprays) total again today and did also premedicate. [facility] had been advised on her treatments and doses needed. Pt sees [name], NP and she will follow her throughout her treatments and follow ups.  Pt's Spravato is ordered through [facility].  Spravato medication is stored at treatment center per REMS/FDA guidelines. The medication is required to be locked behind two doors per REMS/FDA protocol. Medication is also disposed of properly after each use per regulations. All documentation for REMS is completed and submitted per FDA/REMS requirements.          Pt directed to treatment room, started vitals at 10:00 AM, 136/86, pulse 74. Pt did take Zofran 4 mg and Meclizine 25 mg prior to arriving at office, an hour ahead. Instructed patient to blow her nose if needed then recline back to a 45 degree angle if that was more comfortable for her. Gave patient first dose 28 mg nasal spray, administered in each nostril as directed and observed by nurse, waited 5 more minutes for the second and third doses. Placed a fan in her room and left her door open in case she did start to experience the extreme feeling of being hot.  After all doses given pt did complain of nausea, but no vomiting, pt did have a drink and some mints to help with the taste of Spravato it gives the metallic taste. Her 40 minute check was at 10:45 AM, Vital signs were 152/102, pulse 75, rechecked in left arm 150/93. Pulse ox 96%. Pt aware she would be monitored for a total time of 120 minutes.  Discharge vitals were taken at 11:54 AM 142/94, P 69, Pulse Ox 96%. Pt met with Rene Kocher to discuss her  treatment. Pt advised to relax the rest of the day. No driving, no intense activities. Verbalized understanding. Pt reports no issues today. Nurse was with pt a total of 60 minutes for clinical assessment. Pt will plan on returning Tuesday, February 4th. Pt instructed to call office with any problems or questions prior to next treatment.      LOT [id number] MAY 2027

## 2023-09-20 NOTE — Progress Notes (Signed)
Specialty Pharmacy Refill Coordination Note  Robin Stevenson is a 48 y.o. female contacted today regarding refills of specialty medication(s) Esketamine HCl (Spravato (84 MG Dose))   Patient requested Courier to Provider Office   Delivery date: 09/24/23   Verified address: [facility]   ATTN [name]  [address]   Medication will be filled on 09/24/23. SAME DAY COURIER, SIGNATURE REQUIRED.   Per Gloris Manchester, RN, the patient is doing well on therapy with no issues.   REMs Authorization information will be added on the date of dispense.

## 2023-09-24 ENCOUNTER — Other Ambulatory Visit (HOSPITAL_COMMUNITY): Payer: Self-pay

## 2023-09-24 ENCOUNTER — Telehealth: Payer: Self-pay

## 2023-09-24 ENCOUNTER — Other Ambulatory Visit: Payer: Self-pay

## 2023-09-24 NOTE — Telephone Encounter (Signed)
Prior authorization initiated and approved for Spravato 84 mg effective 02/03/22025-09/23/2024 with [employer].   [employer] notified.

## 2023-09-25 ENCOUNTER — Ambulatory Visit: Payer: Commercial Managed Care - PPO

## 2023-09-25 ENCOUNTER — Other Ambulatory Visit: Payer: Self-pay

## 2023-09-25 ENCOUNTER — Ambulatory Visit: Payer: Commercial Managed Care - PPO | Admitting: Adult Health

## 2023-09-25 ENCOUNTER — Encounter: Payer: Self-pay | Admitting: Adult Health

## 2023-09-25 VITALS — BP 136/97 | HR 69

## 2023-09-25 DIAGNOSIS — F339 Major depressive disorder, recurrent, unspecified: Secondary | ICD-10-CM

## 2023-09-25 NOTE — Progress Notes (Signed)
Spravato required PA. PA approved on 09/24/23. [name] aware.  Same day Courier 09/25/23. Signature Required.   DEA [id number]    Authorization code: [id number]

## 2023-09-25 NOTE — Progress Notes (Signed)
 NURSES NOTE:   Pt arrived for her # 22 Spravato  Treatment for treatment resistant depression, the starting dose was 56 mg (2 of the 28 mg nasal sprays) pt stayed at that dose for the first 4 treatments and also takes Zofran  and Meclizine  prior to arrival at office. Pt received 84 mg (3 of the 28 mg nasal sprays) total again today and did also premedicate. [facility] had been advised on her treatments and doses needed. Pt sees Angeline Sayers, NP and she will follow her throughout her treatments and follow ups.  Pt's Spravato  is ordered through [facility].  Spravato  medication is stored at treatment center per REMS/FDA guidelines. The medication is required to be locked behind two doors per REMS/FDA protocol. Medication is also disposed of properly after each use per regulations. All documentation for REMS is completed and submitted per FDA/REMS requirements. Pt's prior authorization was submitted and approved for Spravato  84 mg effective 09/24/2023-09/23/2024. Max daily dose is 0.43         Pt directed to treatment room, started vitals at 9:55 AM, 129/80, pulse 76. Pt did take Zofran  4 mg and Meclizine  25 mg prior to arriving at office, an hour ahead. Instructed patient to blow her nose if needed then recline back to a 45 degree angle if that was more comfortable for her. Gave patient first dose 28 mg nasal spray, administered in each nostril as directed and observed by nurse, waited 5 more minutes for the second and third doses. Placed a fan in her room and left her door open in case she did start to experience the extreme feeling of being hot.  After all doses given pt did not complain of nausea. pt keeps water and some mints to help with the taste of Spravato  it gives the metallic taste. Her 40 minute check was at 10:50 AM, Vital signs were 142/84, pulse 69. Pulse ox 96%. Pt aware she would be monitored for a total time of 120 minutes.  Discharge vitals were  taken at 12:00 PM 136/97, P 69, Pulse Ox 96%. Pt met with Angeline to discuss her treatment. Pt advised to relax the rest of the day. No driving, no intense activities. Verbalized understanding. Pt reports no issues today. Nurse was with pt a total of 60 minutes for clinical assessment. Pt will plan on returning Tuesday, February 11th. Pt instructed to call office with any problems or questions prior to next treatment.      LOT [id number] MAY 2027

## 2023-09-25 NOTE — Progress Notes (Signed)
Robin Stevenson [id number] 09/24/1975 48 y.o.  Subjective:   Patient ID:  Robin Stevenson is a 48 y.o. (DOB 08/11/1976) female.  Chief Complaint: No chief complaint on file.   HPI Robin Stevenson presents to the office today for follow-up of  treatment resistant depression (TRD).   Spravato treatment    Patient was administered Spravato 84 mg intranasally today. Patient was observed by provider throughout [facility] treatment. The patient experienced the typical dissociation which gradually resolved over the 2-hour period of observation. She did not feel sleepy, decreased feeling of sensitivity (numbness), lack of energy, increased blood pressure, feeling happy or very excited, or headache. She denied feeling disconnected from themself, their thoughts, feelings and things around them. Blood pressures remained within normal ranges at the 40-minute and 2-hour follow-up intervals. By the time the 2-hour observation period was met the patient was alert and oriented and able to exit without assistance. Patient willing to continue Spravato administration for the treatment of resistant depression. See nursing note for further details.      Review of Systems:  Review of Systems  Musculoskeletal:  Negative for gait problem.  Neurological:  Negative for tremors.  Psychiatric/Behavioral:         Please refer to HPI    Medications: I have reviewed the patient's current medications.  Current Outpatient Medications  Medication Sig Dispense Refill   ALPRAZolam (XANAX) 0.5 MG tablet Take 1 tablet at bedtime the night before procedure. Take 1 tablet 1 hour before arrival. Bring third tablet with you to procedure. 3 tablet 0   atorvastatin (LIPITOR) 20 MG tablet Take 1 tablet (20 mg total) by mouth daily. 90 tablet 3   atovaquone-proguanil (MALARONE) 250-100 MG TABS tablet Take 1 tablet by mouth once daily begin 2 days prearrival,take daily during stay & continue for 7 days posttravel for  malaria prevention 24 tablet 0   azithromycin (ZITHROMAX) 500 MG tablet For non bloody diarrhea,take 2 tabs on day 1, if resolved,stop med,If diarrhea persists 1 tab on day 2 & 3. For bloody diarrhea,2 tabs on day 1 & 1 tab on day 2 & 3 4 tablet 0   buPROPion (WELLBUTRIN XL) 150 MG 24 hr tablet   3   buPROPion (WELLBUTRIN XL) 150 MG 24 hr tablet Take 1 tablet (150 mg total) by mouth every morning. 90 tablet 0   cariprazine (VRAYLAR) 1.5 MG capsule Take 1 capsule (1.5 mg total) by mouth daily. 28 capsule 0   celecoxib (CELEBREX) 200 MG capsule Take 1 capsule by mouth single dose as directed. Take 1 hour prior to arrival for surgery 1 capsule 0   Esketamine HCl, 56 MG Dose, (SPRAVATO, 56 MG DOSE,) 28 MG/DEVICE SOPK Dispense X 2 (28mg ) devices, administer on day 1 intranasally as directed 4 each 0   Esketamine HCl, 84 MG Dose, (SPRAVATO, 84 MG DOSE,) 28 MG/DEVICE SOPK Dispense X 3 (28 mg) devices for treatments every 7 days, administer intranasally 3 each 3   esomeprazole (NEXIUM) 20 MG capsule Take 20 mg by mouth once.     FETZIMA 120 MG CP24   3   fluocinonide ointment (LIDEX) 0.05 % Apply as directed to skin twice a day 30 g 0   gabapentin (NEURONTIN) 300 MG capsule Take 1 capsule (300 mg total) by mouth 3 (three) times daily. Take first dose 1 hour before arrival for surgery. 21 capsule 0   ketoconazole (NIZORAL) 2 % cream Apply a small amount to affected area on left foot/toe  once a day 30 g 0   Levomilnacipran HCl ER (FETZIMA) 120 MG CP24 Take 1 capsule by mouth daily. 90 capsule 3   meclizine (ANTIVERT) 25 MG tablet Take 1 tablet (25 mg total) by mouth as directed prior to treatment. 60 tablet 0   metFORMIN (GLUCOPHAGE) 500 MG tablet Take 1 tablet (500 mg total) by mouth daily. 90 tablet 1   metFORMIN (GLUCOPHAGE) 500 MG tablet Take 1 tablet (500 mg total) by mouth 2 (two) times daily. 180 tablet 1   metFORMIN (GLUCOPHAGE) 500 MG tablet Take 1 tablet (500 mg total) by mouth 2 (two) times daily.  90 tablet 1   metFORMIN (GLUCOPHAGE-XR) 500 MG 24 hr tablet Take 1 tablet (500 mg total) by mouth daily. Please do not fill if not picked up within 30 days. 30 tablet 0   methocarbamol (ROBAXIN) 500 MG tablet Take 1 tablet (500 mg total) by mouth every 6 (six) hours as needed for muscle spasms. (Patient not taking: Reported on 05/22/2018) 45 tablet 0   ondansetron (ZOFRAN) 4 MG tablet Take 1 tablet (4 mg total) by mouth every 6 (six) hours for nausea. TAKE 4 tablets 15 minutes prior to arrival for surgery 30 tablet 0   ondansetron (ZOFRAN) 4 MG tablet Take 1 tablet  by mouth every 6 hours as needed for nausea 15 tablet 0   ondansetron (ZOFRAN-ODT) 4 MG disintegrating tablet Dissolve 1 tablet (4 mg total) by mouth every 8 (eight) hours as needed for nausea or vomiting. 20 tablet 1   oxyCODONE (OXY IR/ROXICODONE) 5 MG immediate release tablet Take 1-2 tablet by mouth every six to eight hours as needed for Post Op Pain. DO NOT EXCEED  6 tablets in 24 hours. 30 tablet 0   polyethylene glycol (MIRALAX / GLYCOLAX) packet Take 17 g by mouth daily.     polyethylene glycol-electrolytes (NULYTELY) 420 g solution Take 4,000 mLs by mouth Nightly. 4000 mL 0   Semaglutide-Weight Management (WEGOVY) 2.4 MG/0.75ML SOAJ Inject 2.4 mg into the skin once a week for weight loss. 3 mL 0   sulfamethoxazole-trimethoprim (BACTRIM DS) 800-160 MG tablet Take 1 tablet by mouth 2 (two) times daily as directed. Start the day after surgery. 10 tablet 0   SUMAtriptan (IMITREX) 50 MG tablet TAKE 1 TABLET BY MOUTH AT ONSET OF HEADACHE, MAY REPEAT 1 DOSE AFTER 2 HOURS IF NEEDED 10 tablet 5   SUMAtriptan (IMITREX) 50 MG tablet Take 1 tablet by mouth at onset of headache, may repeat same dose once 2 hours later if needed 10 tablet 5   SUMAtriptan (IMITREX) 50 MG tablet Take 1 tablet (50 mg total) by mouth at the onset of headache. May repeat 1 dose after 2 hours if needed. 10 tablet 5   SUMAtriptan (IMITREX) 50 MG tablet Take 1 tablet (50  mg total) by mouth at onset of headache,may repeat 1 dose after 2 hours if needed, 2 to 3 times a week 9 tablet 6   terbinafine (LAMISIL) 250 MG tablet Take 1 tablet (250 mg total) by mouth daily for Tinea 14 tablet 0   traZODone (DESYREL) 50 MG tablet Take 50 mg by mouth at bedtime.     traZODone (DESYREL) 50 MG tablet TAKE 1 TO 2 TABLETS BY MOUTH AT BEDTIME 180 tablet 1   traZODone (DESYREL) 50 MG tablet Take 1-2 tablets (50-100 mg total) by mouth at bedtime. 180 tablet 1   traZODone (DESYREL) 50 MG tablet Take 1-2 tablets (50-100 mg total) by mouth  at bedtime. 180 tablet 2   WEGOVY 0.5 MG/0.5ML SOAJ Inject 0.5 mg into the skin once a week for weight loss. 2 mL 0   WEGOVY 1 MG/0.5ML SOAJ Inject 1 mg into the skin once a week for weight loss 2 mL 0   WEGOVY 2.4 MG/0.75ML SOAJ Inject 2.4 mg into the skin once a week for weight loss. 3 mL 0   WEGOVY 2.4 MG/0.75ML SOAJ Inject 2.4 mg into the skin once a week fro weight loss 3 mL 0   WEGOVY 2.4 MG/0.75ML SOAJ Inject 2.4 mg into the skin once a week for weight loss 3 mL 0   WEGOVY 2.4 MG/0.75ML SOAJ Inject 2.4 mg,sub-q, into the skin once a week for weight loss 3 mL 0   WEGOVY 2.4 MG/0.75ML SOAJ Inject 2.4 mg into the skin once a week for weight loss. 3 mL 0   WEGOVY 2.4 MG/0.75ML SOAJ Inject 2.4 mg into the skin once a week for weight loss. 3 mL 0   WEGOVY 2.4 MG/0.75ML SOAJ Inject 2.4 mg into the skin once a week for weight loss 3 mL 0   No current facility-administered medications for this visit.    Medication Side Effects: None  Allergies:  Allergies  Allergen Reactions   Cat Dander Anaphylaxis   Brewers Yeast Rash    Past Medical History:  Diagnosis Date   Depression    takes Welbutrin and Fetzima   GERD (gastroesophageal reflux disease)    takes nexium    Past Medical History, Surgical history, Social history, and Family history were reviewed and updated as appropriate.   Please see review of systems for further details on the  patient's review from today.   Objective:   Physical Exam:  There were no vitals taken for this visit.  Physical Exam Constitutional:      General: She is not in acute distress. Musculoskeletal:        General: No deformity.  Neurological:     Mental Status: She is alert and oriented to person, place, and time.     Coordination: Coordination normal.  Psychiatric:        Attention and Perception: Attention and perception normal. She does not perceive auditory or visual hallucinations.        Mood and Affect: Affect is not labile, blunt, angry or inappropriate.        Speech: Speech normal.        Behavior: Behavior normal.        Thought Content: Thought content normal. Thought content is not paranoid or delusional. Thought content does not include homicidal or suicidal ideation. Thought content does not include homicidal or suicidal plan.        Cognition and Memory: Cognition and memory normal.        Judgment: Judgment normal.     Comments: Insight intact     Lab Review:     Component Value Date/Time   NA 140 09/01/2016 0852   K 4.4 09/01/2016 0852   CL 104 09/01/2016 0852   CO2 31 09/01/2016 0852   GLUCOSE 90 09/01/2016 0852   BUN 14 09/01/2016 0852   CREATININE 0.86 09/01/2016 0852   CALCIUM 9.2 09/01/2016 0852   PROT 7.0 09/01/2016 0852   ALBUMIN 4.3 09/01/2016 0852   AST 33 09/01/2016 0852   ALT 70 (H) 09/01/2016 0852   ALKPHOS 66 09/01/2016 0852   BILITOT 0.7 09/01/2016 0852       Component Value Date/Time  WBC 5.1 09/01/2016 0852   RBC 4.30 09/01/2016 0852   HGB 13.5 09/01/2016 0852   HCT 38.9 09/01/2016 0852   PLT 286.0 09/01/2016 0852   MCV 90.4 09/01/2016 0852   MCHC 34.6 09/01/2016 0852   RDW 11.9 09/01/2016 0852   LYMPHSABS 1.5 09/01/2016 0852   MONOABS 0.4 09/01/2016 0852   EOSABS 0.1 09/01/2016 0852   BASOSABS 0.0 09/01/2016 0852    No results found for: "POCLITH", "LITHIUM"   No results found for: "PHENYTOIN", "PHENOBARB", "VALPROATE",  "CBMZ"   .res Assessment: Plan:    Recommendations/Plan   Continue Spravato treatment.   Patient advised to contact office with any questions, adverse effects, or acute worsening in signs and symptoms.  There are no diagnoses linked to this encounter.   Please see After Visit Summary for patient specific instructions.  Future Appointments  Date Time Provider Department Center  10/09/2023  8:20 AM [facility]    No orders of the defined types were placed in this encounter.   -------------------------------

## 2023-09-27 ENCOUNTER — Other Ambulatory Visit (HOSPITAL_COMMUNITY): Payer: Self-pay

## 2023-09-27 ENCOUNTER — Other Ambulatory Visit: Payer: Self-pay

## 2023-09-27 NOTE — Progress Notes (Signed)
 Specialty Pharmacy Refill Coordination Note  Robin Stevenson is a 48 y.o. female contacted today regarding refills of specialty medication(s) Esketamine HCl (Spravato  (84 MG Dose))   Patient requested Courier to Provider Office   Delivery date: 10/01/23   Verified address: [facility], [address], [place], [place]; ATTN: [name]   Medication will be filled on 10/01/23. Medication will be same day couriered to the office, signature required.    Per [name], RN the patient continues to do well on therapy with no issues or concerns.  Her last appointment was yesterday 2/05 and she will be seen again on 2/11.     REMS authorization information will be added on the date of dispense.

## 2023-09-29 ENCOUNTER — Other Ambulatory Visit (HOSPITAL_COMMUNITY): Payer: Self-pay

## 2023-10-01 ENCOUNTER — Other Ambulatory Visit: Payer: Self-pay

## 2023-10-01 ENCOUNTER — Other Ambulatory Visit (HOSPITAL_COMMUNITY): Payer: Self-pay

## 2023-10-01 NOTE — Progress Notes (Signed)
 10/01/23 REMS Authorization Code: [id number]

## 2023-10-02 ENCOUNTER — Ambulatory Visit: Payer: Commercial Managed Care - PPO

## 2023-10-02 ENCOUNTER — Ambulatory Visit: Payer: Commercial Managed Care - PPO | Admitting: Adult Health

## 2023-10-02 ENCOUNTER — Encounter: Payer: Self-pay | Admitting: Adult Health

## 2023-10-02 VITALS — BP 138/93 | HR 79

## 2023-10-02 DIAGNOSIS — F339 Major depressive disorder, recurrent, unspecified: Secondary | ICD-10-CM | POA: Diagnosis not present

## 2023-10-02 NOTE — Progress Notes (Signed)
 Robin Stevenson [id number] 1976-07-26 48 y.o.  Subjective:   Patient ID:  Robin Stevenson is a 48 y.o. (DOB Dec 08, 1975) female.  Chief Complaint: No chief complaint on file.   HPI Robin Stevenson presents to the office today for follow-up of treatment resistant depression (TRD).   Spravato treatment    Patient was administered Spravato 84 mg intranasally today. Patient was observed by provider throughout [facility] treatment. The patient experienced the typical dissociation which gradually resolved over the 2-hour period of observation. She did not feel sleepy, decreased feeling of sensitivity (numbness), lack of energy, increased blood pressure, feeling happy or very excited, or headache. She denied feeling disconnected from themself, their thoughts, feelings and things around them. Blood pressures remained within normal ranges at the 40-minute and 2-hour follow-up intervals. By the time the 2-hour observation period was met the patient was alert and oriented and able to exit without assistance. Patient willing to continue Spravato administration for the treatment of resistant depression. See nursing note for further details.      Review of Systems:  Review of Systems  Musculoskeletal:  Negative for gait problem.  Neurological:  Negative for tremors.  Psychiatric/Behavioral:         Please refer to HPI    Medications: I have reviewed the patient's current medications.  Current Outpatient Medications  Medication Sig Dispense Refill   ALPRAZolam (XANAX) 0.5 MG tablet Take 1 tablet at bedtime the night before procedure. Take 1 tablet 1 hour before arrival. Bring third tablet with you to procedure. 3 tablet 0   atorvastatin (LIPITOR) 20 MG tablet Take 1 tablet (20 mg total) by mouth daily. 90 tablet 3   atovaquone-proguanil (MALARONE) 250-100 MG TABS tablet Take 1 tablet by mouth once daily begin 2 days prearrival,take daily during stay & continue for 7 days posttravel for  malaria prevention 24 tablet 0   azithromycin (ZITHROMAX) 500 MG tablet For non bloody diarrhea,take 2 tabs on day 1, if resolved,stop med,If diarrhea persists 1 tab on day 2 & 3. For bloody diarrhea,2 tabs on day 1 & 1 tab on day 2 & 3 4 tablet 0   buPROPion (WELLBUTRIN XL) 150 MG 24 hr tablet   3   buPROPion (WELLBUTRIN XL) 150 MG 24 hr tablet Take 1 tablet (150 mg total) by mouth every morning. 90 tablet 0   cariprazine (VRAYLAR) 1.5 MG capsule Take 1 capsule (1.5 mg total) by mouth daily. 28 capsule 0   celecoxib (CELEBREX) 200 MG capsule Take 1 capsule by mouth single dose as directed. Take 1 hour prior to arrival for surgery 1 capsule 0   Esketamine HCl, 56 MG Dose, (SPRAVATO, 56 MG DOSE,) 28 MG/DEVICE SOPK Dispense X 2 (28mg ) devices, administer on day 1 intranasally as directed 4 each 0   Esketamine HCl, 84 MG Dose, (SPRAVATO, 84 MG DOSE,) 28 MG/DEVICE SOPK Dispense X 3 (28 mg) devices for treatments every 7 days, administer intranasally 3 each 3   esomeprazole (NEXIUM) 20 MG capsule Take 20 mg by mouth once.     FETZIMA 120 MG CP24   3   fluocinonide ointment (LIDEX) 0.05 % Apply as directed to skin twice a day 30 g 0   gabapentin (NEURONTIN) 300 MG capsule Take 1 capsule (300 mg total) by mouth 3 (three) times daily. Take first dose 1 hour before arrival for surgery. 21 capsule 0   ketoconazole (NIZORAL) 2 % cream Apply a small amount to affected area on left foot/toe once  a day 30 g 0   Levomilnacipran HCl ER (FETZIMA) 120 MG CP24 Take 1 capsule by mouth daily. 90 capsule 3   meclizine (ANTIVERT) 25 MG tablet Take 1 tablet (25 mg total) by mouth as directed prior to treatment. 60 tablet 0   metFORMIN (GLUCOPHAGE) 500 MG tablet Take 1 tablet (500 mg total) by mouth daily. 90 tablet 1   metFORMIN (GLUCOPHAGE) 500 MG tablet Take 1 tablet (500 mg total) by mouth 2 (two) times daily. 180 tablet 1   metFORMIN (GLUCOPHAGE) 500 MG tablet Take 1 tablet (500 mg total) by mouth 2 (two) times daily.  90 tablet 1   metFORMIN (GLUCOPHAGE-XR) 500 MG 24 hr tablet Take 1 tablet (500 mg total) by mouth daily. Please do not fill if not picked up within 30 days. 30 tablet 0   methocarbamol (ROBAXIN) 500 MG tablet Take 1 tablet (500 mg total) by mouth every 6 (six) hours as needed for muscle spasms. (Patient not taking: Reported on 05/22/2018) 45 tablet 0   ondansetron (ZOFRAN) 4 MG tablet Take 1 tablet (4 mg total) by mouth every 6 (six) hours for nausea. TAKE 4 tablets 15 minutes prior to arrival for surgery 30 tablet 0   ondansetron (ZOFRAN) 4 MG tablet Take 1 tablet  by mouth every 6 hours as needed for nausea 15 tablet 0   ondansetron (ZOFRAN-ODT) 4 MG disintegrating tablet Dissolve 1 tablet (4 mg total) by mouth every 8 (eight) hours as needed for nausea or vomiting. 20 tablet 1   oxyCODONE (OXY IR/ROXICODONE) 5 MG immediate release tablet Take 1-2 tablet by mouth every six to eight hours as needed for Post Op Pain. DO NOT EXCEED  6 tablets in 24 hours. 30 tablet 0   polyethylene glycol (MIRALAX / GLYCOLAX) packet Take 17 g by mouth daily.     polyethylene glycol-electrolytes (NULYTELY) 420 g solution Take 4,000 mLs by mouth Nightly. 4000 mL 0   Semaglutide-Weight Management (WEGOVY) 2.4 MG/0.75ML SOAJ Inject 2.4 mg into the skin once a week for weight loss. 3 mL 0   sulfamethoxazole-trimethoprim (BACTRIM DS) 800-160 MG tablet Take 1 tablet by mouth 2 (two) times daily as directed. Start the day after surgery. 10 tablet 0   SUMAtriptan (IMITREX) 50 MG tablet TAKE 1 TABLET BY MOUTH AT ONSET OF HEADACHE, MAY REPEAT 1 DOSE AFTER 2 HOURS IF NEEDED 10 tablet 5   SUMAtriptan (IMITREX) 50 MG tablet Take 1 tablet by mouth at onset of headache, may repeat same dose once 2 hours later if needed 10 tablet 5   SUMAtriptan (IMITREX) 50 MG tablet Take 1 tablet (50 mg total) by mouth at the onset of headache. May repeat 1 dose after 2 hours if needed. 10 tablet 5   SUMAtriptan (IMITREX) 50 MG tablet Take 1 tablet (50  mg total) by mouth at onset of headache,may repeat 1 dose after 2 hours if needed, 2 to 3 times a week 9 tablet 6   terbinafine (LAMISIL) 250 MG tablet Take 1 tablet (250 mg total) by mouth daily for Tinea 14 tablet 0   traZODone (DESYREL) 50 MG tablet Take 50 mg by mouth at bedtime.     traZODone (DESYREL) 50 MG tablet TAKE 1 TO 2 TABLETS BY MOUTH AT BEDTIME 180 tablet 1   traZODone (DESYREL) 50 MG tablet Take 1-2 tablets (50-100 mg total) by mouth at bedtime. 180 tablet 1   traZODone (DESYREL) 50 MG tablet Take 1-2 tablets (50-100 mg total) by mouth at  bedtime. 180 tablet 2   WEGOVY 0.5 MG/0.5ML SOAJ Inject 0.5 mg into the skin once a week for weight loss. 2 mL 0   WEGOVY 1 MG/0.5ML SOAJ Inject 1 mg into the skin once a week for weight loss 2 mL 0   WEGOVY 2.4 MG/0.75ML SOAJ Inject 2.4 mg into the skin once a week for weight loss. 3 mL 0   WEGOVY 2.4 MG/0.75ML SOAJ Inject 2.4 mg into the skin once a week fro weight loss 3 mL 0   WEGOVY 2.4 MG/0.75ML SOAJ Inject 2.4 mg into the skin once a week for weight loss 3 mL 0   WEGOVY 2.4 MG/0.75ML SOAJ Inject 2.4 mg,sub-q, into the skin once a week for weight loss 3 mL 0   WEGOVY 2.4 MG/0.75ML SOAJ Inject 2.4 mg into the skin once a week for weight loss. 3 mL 0   WEGOVY 2.4 MG/0.75ML SOAJ Inject 2.4 mg into the skin once a week for weight loss. 3 mL 0   WEGOVY 2.4 MG/0.75ML SOAJ Inject 2.4 mg into the skin once a week for weight loss 3 mL 0   No current facility-administered medications for this visit.    Medication Side Effects: None  Allergies:  Allergies  Allergen Reactions   Cat Dander Anaphylaxis   Brewers Yeast Rash    Past Medical History:  Diagnosis Date   Depression    takes Welbutrin and Fetzima   GERD (gastroesophageal reflux disease)    takes nexium    Past Medical History, Surgical history, Social history, and Family history were reviewed and updated as appropriate.   Please see review of systems for further details on the  patient's review from today.   Objective:   Physical Exam:  There were no vitals taken for this visit.  Physical Exam Constitutional:      General: She is not in acute distress. Musculoskeletal:        General: No deformity.  Neurological:     Mental Status: She is alert and oriented to person, place, and time.     Coordination: Coordination normal.  Psychiatric:        Attention and Perception: Attention and perception normal. She does not perceive auditory or visual hallucinations.        Mood and Affect: Affect is not labile, blunt, angry or inappropriate.        Speech: Speech normal.        Behavior: Behavior normal.        Thought Content: Thought content normal. Thought content is not paranoid or delusional. Thought content does not include homicidal or suicidal ideation. Thought content does not include homicidal or suicidal plan.        Cognition and Memory: Cognition and memory normal.        Judgment: Judgment normal.     Comments: Insight intact     Lab Review:     Component Value Date/Time   NA 140 09/01/2016 0852   K 4.4 09/01/2016 0852   CL 104 09/01/2016 0852   CO2 31 09/01/2016 0852   GLUCOSE 90 09/01/2016 0852   BUN 14 09/01/2016 0852   CREATININE 0.86 09/01/2016 0852   CALCIUM 9.2 09/01/2016 0852   PROT 7.0 09/01/2016 0852   ALBUMIN 4.3 09/01/2016 0852   AST 33 09/01/2016 0852   ALT 70 (H) 09/01/2016 0852   ALKPHOS 66 09/01/2016 0852   BILITOT 0.7 09/01/2016 0852       Component Value Date/Time   WBC  5.1 09/01/2016 0852   RBC 4.30 09/01/2016 0852   HGB 13.5 09/01/2016 0852   HCT 38.9 09/01/2016 0852   PLT 286.0 09/01/2016 0852   MCV 90.4 09/01/2016 0852   MCHC 34.6 09/01/2016 0852   RDW 11.9 09/01/2016 0852   LYMPHSABS 1.5 09/01/2016 0852   MONOABS 0.4 09/01/2016 0852   EOSABS 0.1 09/01/2016 0852   BASOSABS 0.0 09/01/2016 0852    No results found for: "POCLITH", "LITHIUM"   No results found for: "PHENYTOIN", "PHENOBARB", "VALPROATE",  "CBMZ"   .res Assessment: Plan:    Recommendations/Plan   Continue Spravato treatment.   Patient advised to contact office with any questions, adverse effects, or acute worsening in signs and symptoms.   Diagnoses and all orders for this visit:  Recurrent major depression resistant to treatment [facility])     Please see After Visit Summary for patient specific instructions.  Future Appointments  Date Time Provider Department Center  10/09/2023  8:20 AM [facility]  10/10/2023  1:00 PM CP-NURSE [facility] None  10/10/2023  1:00 PM Dontreal Miera, Thereasa Solo, NP [facility] None    No orders of the defined types were placed in this encounter.   -------------------------------

## 2023-10-03 ENCOUNTER — Other Ambulatory Visit: Payer: Self-pay

## 2023-10-03 NOTE — Progress Notes (Signed)
NURSES NOTE:   Pt arrived for her # 76 Spravato Treatment for treatment resistant depression, the starting dose was 56 mg (2 of the 28 mg nasal sprays) pt stayed at that dose for the first 4 treatments and also takes Zofran and Meclizine prior to arrival at office. Pt received 84 mg (3 of the 28 mg nasal sprays) total again today and did also premedicate. [facility] had been advised on her treatments and doses needed. Pt sees [name], NP and she will follow her throughout her treatments and follow ups.  Pt's Spravato is ordered through [facility].  Spravato medication is stored at treatment center per REMS/FDA guidelines. The medication is required to be locked behind two doors per REMS/FDA protocol. Medication is also disposed of properly after each use per regulations. All documentation for REMS is completed and submitted per FDA/REMS requirements. Pt's prior authorization was submitted and approved for Spravato 84 mg effective 09/24/2023-09/23/2024. Max daily dose is 0.43         Pt directed to treatment room, started vitals at 10:05 AM, 137/84, pulse 80. Pt did take Zofran 4 mg and Meclizine 25 mg prior to arriving at office, an hour ahead. Instructed patient to blow her nose if needed then recline back to a 45 degree angle if that was more comfortable for her. Gave patient first dose 28 mg nasal spray, administered in each nostril as directed and observed by nurse, waited 5 more minutes for the second and third doses. Placed a fan in her room and left her door open in case she did start to experience the extreme feeling of being hot.  After all doses given pt did not complain of nausea. pt keeps water and some mints to help with the taste of Spravato it gives the metallic taste. Her 40 minute check was at 10:50 AM, Vital signs were 142/90, pulse 81. Pulse ox 96%. Pt aware she would be monitored for a total time of 120 minutes.  Discharge vitals were  taken at 11:58 PM 138/93, P 79, Pulse Ox 96%. Pt met with Rene Kocher to discuss her treatment. Pt advised to relax the rest of the day. No driving, no intense activities. Verbalized understanding. Pt reports no issues today. Nurse was with pt a total of 60 minutes for clinical assessment. Pt will plan on returning Wednesday, February 18th. Pt instructed to call office with any problems or questions prior to next treatment.      LOT [id number] 2027

## 2023-10-04 ENCOUNTER — Other Ambulatory Visit: Payer: Self-pay

## 2023-10-04 ENCOUNTER — Other Ambulatory Visit (HOSPITAL_COMMUNITY): Payer: Self-pay

## 2023-10-04 MED ORDER — SPRAVATO (84 MG DOSE) 28 MG/DEVICE NA SOPK
PACK | NASAL | 3 refills | Status: AC
Start: 2023-10-04 — End: ?
  Filled 2023-10-04: qty 3, 7d supply, fill #0
  Filled 2023-10-11: qty 3, 7d supply, fill #1
  Filled 2023-10-19: qty 3, 7d supply, fill #2
  Filled 2023-10-26 (×2): qty 3, 7d supply, fill #3

## 2023-10-04 NOTE — Progress Notes (Signed)
Specialty Pharmacy Refill Coordination Note  Robin Stevenson is a 48 y.o. female contacted today regarding refills of specialty medication(s) Esketamine HCl (Spravato (84 MG Dose))   Patient requested Courier to Provider Office   Delivery date: 10/08/23   Verified address: [facility]   ATTN [name]  [address], [place], [place]   Medication will be filled on 10/08/23. Will be sent via same day courier, signature required.    Per [name], RN, pt is doing well on therapy with no issues or concerns.   REMs Authorization will be documented on the day of dispense.

## 2023-10-08 ENCOUNTER — Other Ambulatory Visit: Payer: Self-pay

## 2023-10-08 NOTE — Progress Notes (Signed)
 10/08/23 REMS Authorization Code [id number]

## 2023-10-09 ENCOUNTER — Ambulatory Visit
Admission: RE | Admit: 2023-10-09 | Discharge: 2023-10-09 | Disposition: A | Discharge status: Home / Self Care | Payer: Commercial Managed Care - PPO | Source: Ambulatory Visit | Attending: Obstetrics and Gynecology | Admitting: Obstetrics and Gynecology

## 2023-10-09 ENCOUNTER — Other Ambulatory Visit: Payer: Self-pay | Admitting: Obstetrics and Gynecology

## 2023-10-09 ENCOUNTER — Ambulatory Visit
Admission: RE | Admit: 2023-10-09 | Discharge: 2023-10-09 | Disposition: A | Discharge status: Home / Self Care | Payer: No Typology Code available for payment source | Source: Ambulatory Visit | Attending: Obstetrics and Gynecology | Admitting: Obstetrics and Gynecology

## 2023-10-09 DIAGNOSIS — R928 Other abnormal and inconclusive findings on diagnostic imaging of breast: Secondary | ICD-10-CM

## 2023-10-09 DIAGNOSIS — R921 Mammographic calcification found on diagnostic imaging of breast: Secondary | ICD-10-CM | POA: Diagnosis not present

## 2023-10-10 ENCOUNTER — Ambulatory Visit (INDEPENDENT_AMBULATORY_CARE_PROVIDER_SITE_OTHER): Payer: Commercial Managed Care - PPO | Admitting: Adult Health

## 2023-10-10 ENCOUNTER — Encounter: Payer: Commercial Managed Care - PPO | Admitting: Adult Health

## 2023-10-10 ENCOUNTER — Ambulatory Visit: Payer: Commercial Managed Care - PPO

## 2023-10-10 ENCOUNTER — Other Ambulatory Visit (HOSPITAL_COMMUNITY): Payer: Self-pay

## 2023-10-10 ENCOUNTER — Encounter: Payer: Self-pay | Admitting: Adult Health

## 2023-10-10 VITALS — BP 146/99 | HR 74

## 2023-10-10 DIAGNOSIS — F339 Major depressive disorder, recurrent, unspecified: Secondary | ICD-10-CM

## 2023-10-10 NOTE — Progress Notes (Signed)
 NURSES NOTE:   Pt arrived for her # 24 Spravato Treatment for treatment resistant depression, the starting dose was 56 mg (2 of the 28 mg nasal sprays) pt stayed at that dose for the first 4 treatments and also takes Zofran and Meclizine prior to arrival at office. Pt received 84 mg (3 of the 28 mg nasal sprays) total again today and did also premedicate. [facility] had been advised on her treatments and doses needed. Pt sees [name], NP and she will follow her throughout her treatments and follow ups.  Pt's Spravato is ordered through [facility].  Spravato medication is stored at treatment center per REMS/FDA guidelines. The medication is required to be locked behind two doors per REMS/FDA protocol. Medication is also disposed of properly after each use per regulations. All documentation for REMS is completed and submitted per FDA/REMS requirements. Pt's prior authorization was submitted and approved for Spravato 84 mg effective 09/24/2023-09/23/2024. Max daily dose is 0.43         Pt directed to treatment room, started vitals at 9:08 AM, 139/84, pulse 76. Pt did take Zofran 4 mg and Meclizine 25 mg prior to arriving at office, an hour ahead. Instructed patient to blow her nose if needed then recline back to a 45 degree angle if that was more comfortable for her. Gave patient first dose 28 mg nasal spray, administered in each nostril as directed and observed by nurse, waited 5 more minutes for the second and third doses. Placed a fan in her room and left her door open in case she did start to experience the extreme feeling of being hot.  After all doses given pt did not complain of nausea. pt keeps water and some mints to help with the taste of Spravato it gives the metallic taste. Pt reports she got hot suddenly then started vomiting. She did not vomit as much as her previous treatments. Placed fan right in front of her face then she felt better. Her 40  minute check was at 9:50 AM, Vital signs were 156/99, pulse 67. Pulse ox 96%. Pt aware she would be monitored for a total time of 120 minutes.  Discharge vitals were taken at 10:59 PM 146/99, P 74, Pulse Ox 96%. Pt met with Rene Kocher to discuss her treatment. Pt advised to relax the rest of the day. No driving, no intense activities. Verbalized understanding. Pt reports no issues today. Nurse was with pt a total of 60 minutes for clinical assessment. Pt will return on Thursday, February 27th. Pt instructed to call office with any problems or questions prior to next treatment.      LOT [id number] MAY 2027

## 2023-10-10 NOTE — Progress Notes (Signed)
 Robin Stevenson [id number] Oct 16, 1975 48 y.o.  Subjective:   Patient ID:  Robin Stevenson is a 48 y.o. (DOB 01/29/1976) female.  Chief Complaint: No chief complaint on file.   HPI Robin Stevenson presents to the office today for follow-up of treatment resistant depression (TRD).   Spravato treatment    Patient was administered Spravato 84 mg intranasally today. Patient was observed by provider throughout [facility] treatment. The patient experienced the typical dissociation which gradually resolved over the 2-hour period of observation. She did not feel sleepy, decreased feeling of sensitivity (numbness), lack of energy, increased blood pressure, feeling happy or very excited, or headache. She denied feeling disconnected from themself, their thoughts, feelings and things around them. Blood pressures remained within normal ranges at the 40-minute and 2-hour follow-up intervals. By the time the 2-hour observation period was met the patient was alert and oriented and able to exit without assistance. Patient willing to continue Spravato administration for the treatment of resistant depression. See nursing note for further details.      Review of Systems:  Review of Systems  Musculoskeletal:  Negative for gait problem.  Neurological:  Negative for tremors.  Psychiatric/Behavioral:         Please refer to HPI    Medications: I have reviewed the patient's current medications.  Current Outpatient Medications  Medication Sig Dispense Refill   ALPRAZolam (XANAX) 0.5 MG tablet Take 1 tablet at bedtime the night before procedure. Take 1 tablet 1 hour before arrival. Bring third tablet with you to procedure. 3 tablet 0   atorvastatin (LIPITOR) 20 MG tablet Take 1 tablet (20 mg total) by mouth daily. 90 tablet 3   atovaquone-proguanil (MALARONE) 250-100 MG TABS tablet Take 1 tablet by mouth once daily begin 2 days prearrival,take daily during stay & continue for 7 days posttravel for  malaria prevention 24 tablet 0   azithromycin (ZITHROMAX) 500 MG tablet For non bloody diarrhea,take 2 tabs on day 1, if resolved,stop med,If diarrhea persists 1 tab on day 2 & 3. For bloody diarrhea,2 tabs on day 1 & 1 tab on day 2 & 3 4 tablet 0   buPROPion (WELLBUTRIN XL) 150 MG 24 hr tablet   3   buPROPion (WELLBUTRIN XL) 150 MG 24 hr tablet Take 1 tablet (150 mg total) by mouth every morning. 90 tablet 0   cariprazine (VRAYLAR) 1.5 MG capsule Take 1 capsule (1.5 mg total) by mouth daily. 28 capsule 0   celecoxib (CELEBREX) 200 MG capsule Take 1 capsule by mouth single dose as directed. Take 1 hour prior to arrival for surgery 1 capsule 0   Esketamine HCl, 56 MG Dose, (SPRAVATO, 56 MG DOSE,) 28 MG/DEVICE SOPK Dispense X 2 (28mg ) devices, administer on day 1 intranasally as directed 4 each 0   Esketamine HCl, 84 MG Dose, (SPRAVATO, 84 MG DOSE,) 28 MG/DEVICE SOPK Dispense X 3 (28 mg) devices for treatments every 7 days, administer intranasally 3 each 3   esomeprazole (NEXIUM) 20 MG capsule Take 20 mg by mouth once.     FETZIMA 120 MG CP24   3   fluocinonide ointment (LIDEX) 0.05 % Apply as directed to skin twice a day 30 g 0   gabapentin (NEURONTIN) 300 MG capsule Take 1 capsule (300 mg total) by mouth 3 (three) times daily. Take first dose 1 hour before arrival for surgery. 21 capsule 0   ketoconazole (NIZORAL) 2 % cream Apply a small amount to affected area on left foot/toe once  a day 30 g 0   Levomilnacipran HCl ER (FETZIMA) 120 MG CP24 Take 1 capsule by mouth daily. 90 capsule 3   meclizine (ANTIVERT) 25 MG tablet Take 1 tablet (25 mg total) by mouth as directed prior to treatment. 60 tablet 0   metFORMIN (GLUCOPHAGE) 500 MG tablet Take 1 tablet (500 mg total) by mouth daily. 90 tablet 1   metFORMIN (GLUCOPHAGE) 500 MG tablet Take 1 tablet (500 mg total) by mouth 2 (two) times daily. 180 tablet 1   metFORMIN (GLUCOPHAGE) 500 MG tablet Take 1 tablet (500 mg total) by mouth 2 (two) times daily.  90 tablet 1   metFORMIN (GLUCOPHAGE-XR) 500 MG 24 hr tablet Take 1 tablet (500 mg total) by mouth daily. Please do not fill if not picked up within 30 days. 30 tablet 0   methocarbamol (ROBAXIN) 500 MG tablet Take 1 tablet (500 mg total) by mouth every 6 (six) hours as needed for muscle spasms. (Patient not taking: Reported on 05/22/2018) 45 tablet 0   ondansetron (ZOFRAN) 4 MG tablet Take 1 tablet (4 mg total) by mouth every 6 (six) hours for nausea. TAKE 4 tablets 15 minutes prior to arrival for surgery 30 tablet 0   ondansetron (ZOFRAN) 4 MG tablet Take 1 tablet  by mouth every 6 hours as needed for nausea 15 tablet 0   ondansetron (ZOFRAN-ODT) 4 MG disintegrating tablet Dissolve 1 tablet (4 mg total) by mouth every 8 (eight) hours as needed for nausea or vomiting. 20 tablet 1   oxyCODONE (OXY IR/ROXICODONE) 5 MG immediate release tablet Take 1-2 tablet by mouth every six to eight hours as needed for Post Op Pain. DO NOT EXCEED  6 tablets in 24 hours. 30 tablet 0   polyethylene glycol (MIRALAX / GLYCOLAX) packet Take 17 g by mouth daily.     polyethylene glycol-electrolytes (NULYTELY) 420 g solution Take 4,000 mLs by mouth Nightly. 4000 mL 0   Semaglutide-Weight Management (WEGOVY) 2.4 MG/0.75ML SOAJ Inject 2.4 mg into the skin once a week for weight loss. 3 mL 0   sulfamethoxazole-trimethoprim (BACTRIM DS) 800-160 MG tablet Take 1 tablet by mouth 2 (two) times daily as directed. Start the day after surgery. 10 tablet 0   SUMAtriptan (IMITREX) 50 MG tablet TAKE 1 TABLET BY MOUTH AT ONSET OF HEADACHE, MAY REPEAT 1 DOSE AFTER 2 HOURS IF NEEDED 10 tablet 5   SUMAtriptan (IMITREX) 50 MG tablet Take 1 tablet by mouth at onset of headache, may repeat same dose once 2 hours later if needed 10 tablet 5   SUMAtriptan (IMITREX) 50 MG tablet Take 1 tablet (50 mg total) by mouth at the onset of headache. May repeat 1 dose after 2 hours if needed. 10 tablet 5   SUMAtriptan (IMITREX) 50 MG tablet Take 1 tablet (50  mg total) by mouth at onset of headache,may repeat 1 dose after 2 hours if needed, 2 to 3 times a week 9 tablet 6   terbinafine (LAMISIL) 250 MG tablet Take 1 tablet (250 mg total) by mouth daily for Tinea 14 tablet 0   traZODone (DESYREL) 50 MG tablet Take 50 mg by mouth at bedtime.     traZODone (DESYREL) 50 MG tablet TAKE 1 TO 2 TABLETS BY MOUTH AT BEDTIME 180 tablet 1   traZODone (DESYREL) 50 MG tablet Take 1-2 tablets (50-100 mg total) by mouth at bedtime. 180 tablet 1   traZODone (DESYREL) 50 MG tablet Take 1-2 tablets (50-100 mg total) by mouth at  bedtime. 180 tablet 2   WEGOVY 0.5 MG/0.5ML SOAJ Inject 0.5 mg into the skin once a week for weight loss. 2 mL 0   WEGOVY 1 MG/0.5ML SOAJ Inject 1 mg into the skin once a week for weight loss 2 mL 0   WEGOVY 2.4 MG/0.75ML SOAJ Inject 2.4 mg into the skin once a week for weight loss. 3 mL 0   WEGOVY 2.4 MG/0.75ML SOAJ Inject 2.4 mg into the skin once a week fro weight loss 3 mL 0   WEGOVY 2.4 MG/0.75ML SOAJ Inject 2.4 mg into the skin once a week for weight loss 3 mL 0   WEGOVY 2.4 MG/0.75ML SOAJ Inject 2.4 mg,sub-q, into the skin once a week for weight loss 3 mL 0   WEGOVY 2.4 MG/0.75ML SOAJ Inject 2.4 mg into the skin once a week for weight loss. 3 mL 0   WEGOVY 2.4 MG/0.75ML SOAJ Inject 2.4 mg into the skin once a week for weight loss. 3 mL 0   WEGOVY 2.4 MG/0.75ML SOAJ Inject 2.4 mg into the skin once a week for weight loss 3 mL 0   No current facility-administered medications for this visit.    Medication Side Effects: None  Allergies:  Allergies  Allergen Reactions   Cat Dander Anaphylaxis   Brewers Yeast Rash    Past Medical History:  Diagnosis Date   Depression    takes Welbutrin and Fetzima   GERD (gastroesophageal reflux disease)    takes nexium    Past Medical History, Surgical history, Social history, and Family history were reviewed and updated as appropriate.   Please see review of systems for further details on the  patient's review from today.   Objective:   Physical Exam:  There were no vitals taken for this visit.  Physical Exam Constitutional:      General: She is not in acute distress. Musculoskeletal:        General: No deformity.  Neurological:     Mental Status: She is alert and oriented to person, place, and time.     Coordination: Coordination normal.  Psychiatric:        Attention and Perception: Attention and perception normal. She does not perceive auditory or visual hallucinations.        Mood and Affect: Affect is not labile, blunt, angry or inappropriate.        Speech: Speech normal.        Behavior: Behavior normal.        Thought Content: Thought content normal. Thought content is not paranoid or delusional. Thought content does not include homicidal or suicidal ideation. Thought content does not include homicidal or suicidal plan.        Cognition and Memory: Cognition and memory normal.        Judgment: Judgment normal.     Comments: Insight intact     Lab Review:     Component Value Date/Time   NA 140 09/01/2016 0852   K 4.4 09/01/2016 0852   CL 104 09/01/2016 0852   CO2 31 09/01/2016 0852   GLUCOSE 90 09/01/2016 0852   BUN 14 09/01/2016 0852   CREATININE 0.86 09/01/2016 0852   CALCIUM 9.2 09/01/2016 0852   PROT 7.0 09/01/2016 0852   ALBUMIN 4.3 09/01/2016 0852   AST 33 09/01/2016 0852   ALT 70 (H) 09/01/2016 0852   ALKPHOS 66 09/01/2016 0852   BILITOT 0.7 09/01/2016 0852       Component Value Date/Time   WBC  5.1 09/01/2016 0852   RBC 4.30 09/01/2016 0852   HGB 13.5 09/01/2016 0852   HCT 38.9 09/01/2016 0852   PLT 286.0 09/01/2016 0852   MCV 90.4 09/01/2016 0852   MCHC 34.6 09/01/2016 0852   RDW 11.9 09/01/2016 0852   LYMPHSABS 1.5 09/01/2016 0852   MONOABS 0.4 09/01/2016 0852   EOSABS 0.1 09/01/2016 0852   BASOSABS 0.0 09/01/2016 0852    No results found for: "POCLITH", "LITHIUM"   No results found for: "PHENYTOIN", "PHENOBARB", "VALPROATE",  "CBMZ"   .res Assessment: Plan:    Recommendations/Plan   Continue Spravato treatment.   Patient advised to contact office with any questions, adverse effects, or acute worsening in signs and symptoms.  There are no diagnoses linked to this encounter.   Please see After Visit Summary for patient specific instructions.  No future appointments.  No orders of the defined types were placed in this encounter.   -------------------------------

## 2023-10-11 ENCOUNTER — Other Ambulatory Visit (HOSPITAL_COMMUNITY): Payer: Self-pay

## 2023-10-11 ENCOUNTER — Other Ambulatory Visit: Payer: Self-pay

## 2023-10-11 NOTE — Progress Notes (Signed)
 Specialty Pharmacy Refill Coordination Note  Robin Stevenson is a 48 y.o. female contacted today regarding refills of specialty medication(s) Esketamine HCl (Spravato (84 MG Dose))   Patient requested Courier to Provider Office   Delivery date: 10/16/23   Verified address: [facility]   ATTN [name]  [address], [place], [place]   Medication will be filled on 10/16/23. Same day courier, signature required.   [name], RN reports that the patient experiences some hot flashes and nausea while getting her spravato, both remain tolerable and she pretreats with Zofran for the nausea.    REMs authorization information will be added on the date of dispense.

## 2023-10-13 ENCOUNTER — Other Ambulatory Visit: Payer: Self-pay | Admitting: Obstetrics and Gynecology

## 2023-10-13 DIAGNOSIS — R921 Mammographic calcification found on diagnostic imaging of breast: Secondary | ICD-10-CM

## 2023-10-16 ENCOUNTER — Other Ambulatory Visit: Payer: Self-pay

## 2023-10-16 ENCOUNTER — Other Ambulatory Visit (HOSPITAL_COMMUNITY): Payer: Self-pay

## 2023-10-16 NOTE — Progress Notes (Signed)
 10/16/23-CACarolina Sink  Same day/signature required w/courier express  Control number: [id number]

## 2023-10-16 NOTE — Progress Notes (Signed)
 Spravato 84mg  same day couriered, signature required 10/16/23  Healthcare Setting DEA: [id number]  REMS Authorization Code: [id number]

## 2023-10-18 ENCOUNTER — Encounter: Payer: Self-pay | Admitting: Physician Assistant

## 2023-10-18 ENCOUNTER — Ambulatory Visit: Payer: Commercial Managed Care - PPO

## 2023-10-18 ENCOUNTER — Ambulatory Visit (INDEPENDENT_AMBULATORY_CARE_PROVIDER_SITE_OTHER): Payer: Commercial Managed Care - PPO | Admitting: Physician Assistant

## 2023-10-18 VITALS — BP 146/88 | HR 67

## 2023-10-18 DIAGNOSIS — F339 Major depressive disorder, recurrent, unspecified: Secondary | ICD-10-CM

## 2023-10-18 DIAGNOSIS — F411 Generalized anxiety disorder: Secondary | ICD-10-CM

## 2023-10-18 NOTE — Progress Notes (Signed)
 [facility]  Patient ID: Robin Stevenson,  MRN: [id number]  PCP: [name], MD (Inactive)  Date of Evaluation: 10/18/2023 Time spent:45 minutes  Chief Complaint:  Chief Complaint   Depression; Other     HISTORY/CURRENT STATUS: HPI for Spravato treatment  Robin Stevenson is a patient of [name], NP who I am seeing today in her absence.  She states the treatment today went fine.  She specifically has had no headache, dizziness, or other neurologic symptoms.  Her blood pressure did go up at the 40-minute mark after Spravato but this can be common for her.  She checks her blood pressure at home and it runs within normal limits.  Denies dizziness, syncope, seizures, numbness, tingling, tremor, tics, unsteady gait, slurred speech, confusion. Denies muscle or joint pain, stiffness, or dystonia.  Individual Medical History/ Review of Systems: Changes? :No   Past medications for mental health diagnoses include: Spravato, Xanax, Wellbutrin, Vraylar, gabapentin, Fetzima, trazodone   Allergies: Cat dander and Brewers yeast  Current Medications:  Current Outpatient Medications:    ALPRAZolam (XANAX) 0.5 MG tablet, Take 1 tablet at bedtime the night before procedure. Take 1 tablet 1 hour before arrival. Bring third tablet with you to procedure., Disp: 3 tablet, Rfl: 0   atorvastatin (LIPITOR) 20 MG tablet, Take 1 tablet (20 mg total) by mouth daily., Disp: 90 tablet, Rfl: 3   atovaquone-proguanil (MALARONE) 250-100 MG TABS tablet, Take 1 tablet by mouth once daily begin 2 days prearrival,take daily during stay & continue for 7 days posttravel for malaria prevention, Disp: 24 tablet, Rfl: 0   azithromycin (ZITHROMAX) 500 MG tablet, For non bloody diarrhea,take 2 tabs on day 1, if resolved,stop med,If diarrhea persists 1 tab on day 2 & 3. For bloody diarrhea,2 tabs on day 1 & 1 tab on day 2 & 3, Disp: 4 tablet, Rfl: 0   buPROPion (WELLBUTRIN XL) 150 MG 24 hr tablet, , Disp: , Rfl:  3   buPROPion (WELLBUTRIN XL) 150 MG 24 hr tablet, Take 1 tablet (150 mg total) by mouth every morning., Disp: 90 tablet, Rfl: 0   cariprazine (VRAYLAR) 1.5 MG capsule, Take 1 capsule (1.5 mg total) by mouth daily., Disp: 28 capsule, Rfl: 0   celecoxib (CELEBREX) 200 MG capsule, Take 1 capsule by mouth single dose as directed. Take 1 hour prior to arrival for surgery, Disp: 1 capsule, Rfl: 0   Esketamine HCl, 56 MG Dose, (SPRAVATO, 56 MG DOSE,) 28 MG/DEVICE SOPK, Dispense X 2 (28mg ) devices, administer on day 1 intranasally as directed, Disp: 4 each, Rfl: 0   Esketamine HCl, 84 MG Dose, (SPRAVATO, 84 MG DOSE,) 28 MG/DEVICE SOPK, Dispense X 3 (28 mg) devices for treatments every 7 days, administer intranasally, Disp: 3 each, Rfl: 3   esomeprazole (NEXIUM) 20 MG capsule, Take 20 mg by mouth once., Disp: , Rfl:    fluocinonide ointment (LIDEX) 0.05 %, Apply as directed to skin twice a day, Disp: 30 g, Rfl: 0   gabapentin (NEURONTIN) 300 MG capsule, Take 1 capsule (300 mg total) by mouth 3 (three) times daily. Take first dose 1 hour before arrival for surgery., Disp: 21 capsule, Rfl: 0   ketoconazole (NIZORAL) 2 % cream, Apply a small amount to affected area on left foot/toe once a day, Disp: 30 g, Rfl: 0   Levomilnacipran HCl ER (FETZIMA) 120 MG CP24, Take 1 capsule by mouth daily., Disp: 90 capsule, Rfl: 3   metFORMIN (GLUCOPHAGE) 500 MG tablet, Take 1 tablet (500  mg total) by mouth 2 (two) times daily., Disp: 180 tablet, Rfl: 1   metFORMIN (GLUCOPHAGE-XR) 500 MG 24 hr tablet, Take 1 tablet (500 mg total) by mouth daily. Please do not fill if not picked up within 30 days., Disp: 30 tablet, Rfl: 0   ondansetron (ZOFRAN) 4 MG tablet, Take 1 tablet  by mouth every 6 hours as needed for nausea, Disp: 15 tablet, Rfl: 0   traZODone (DESYREL) 50 MG tablet, Take 1-2 tablets (50-100 mg total) by mouth at bedtime., Disp: 180 tablet, Rfl: 2   FETZIMA 120 MG CP24, , Disp: , Rfl: 3   meclizine (ANTIVERT) 25 MG  tablet, Take 1 tablet (25 mg total) by mouth as directed prior to treatment., Disp: 60 tablet, Rfl: 0   metFORMIN (GLUCOPHAGE) 500 MG tablet, Take 1 tablet (500 mg total) by mouth daily., Disp: 90 tablet, Rfl: 1   metFORMIN (GLUCOPHAGE) 500 MG tablet, Take 1 tablet (500 mg total) by mouth 2 (two) times daily., Disp: 90 tablet, Rfl: 1   methocarbamol (ROBAXIN) 500 MG tablet, Take 1 tablet (500 mg total) by mouth every 6 (six) hours as needed for muscle spasms. (Patient not taking: Reported on 05/22/2018), Disp: 45 tablet, Rfl: 0   ondansetron (ZOFRAN) 4 MG tablet, Take 1 tablet (4 mg total) by mouth every 6 (six) hours for nausea. TAKE 4 tablets 15 minutes prior to arrival for surgery, Disp: 30 tablet, Rfl: 0   ondansetron (ZOFRAN-ODT) 4 MG disintegrating tablet, Dissolve 1 tablet (4 mg total) by mouth every 8 (eight) hours as needed for nausea or vomiting., Disp: 20 tablet, Rfl: 1   oxyCODONE (OXY IR/ROXICODONE) 5 MG immediate release tablet, Take 1-2 tablet by mouth every six to eight hours as needed for Post Op Pain. DO NOT EXCEED  6 tablets in 24 hours., Disp: 30 tablet, Rfl: 0   polyethylene glycol (MIRALAX / GLYCOLAX) packet, Take 17 g by mouth daily., Disp: , Rfl:    polyethylene glycol-electrolytes (NULYTELY) 420 g solution, Take 4,000 mLs by mouth Nightly., Disp: 4000 mL, Rfl: 0   Semaglutide-Weight Management (WEGOVY) 2.4 MG/0.75ML SOAJ, Inject 2.4 mg into the skin once a week for weight loss., Disp: 3 mL, Rfl: 0   sulfamethoxazole-trimethoprim (BACTRIM DS) 800-160 MG tablet, Take 1 tablet by mouth 2 (two) times daily as directed. Start the day after surgery., Disp: 10 tablet, Rfl: 0   SUMAtriptan (IMITREX) 50 MG tablet, TAKE 1 TABLET BY MOUTH AT ONSET OF HEADACHE, MAY REPEAT 1 DOSE AFTER 2 HOURS IF NEEDED, Disp: 10 tablet, Rfl: 5   SUMAtriptan (IMITREX) 50 MG tablet, Take 1 tablet by mouth at onset of headache, may repeat same dose once 2 hours later if needed, Disp: 10 tablet, Rfl: 5    SUMAtriptan (IMITREX) 50 MG tablet, Take 1 tablet (50 mg total) by mouth at the onset of headache. May repeat 1 dose after 2 hours if needed., Disp: 10 tablet, Rfl: 5   SUMAtriptan (IMITREX) 50 MG tablet, Take 1 tablet (50 mg total) by mouth at onset of headache,may repeat 1 dose after 2 hours if needed, 2 to 3 times a week, Disp: 9 tablet, Rfl: 6   terbinafine (LAMISIL) 250 MG tablet, Take 1 tablet (250 mg total) by mouth daily for Tinea, Disp: 14 tablet, Rfl: 0   traZODone (DESYREL) 50 MG tablet, Take 50 mg by mouth at bedtime., Disp: , Rfl:    traZODone (DESYREL) 50 MG tablet, TAKE 1 TO 2 TABLETS BY MOUTH AT BEDTIME,  Disp: 180 tablet, Rfl: 1   traZODone (DESYREL) 50 MG tablet, Take 1-2 tablets (50-100 mg total) by mouth at bedtime., Disp: 180 tablet, Rfl: 1   WEGOVY 0.5 MG/0.5ML SOAJ, Inject 0.5 mg into the skin once a week for weight loss., Disp: 2 mL, Rfl: 0   WEGOVY 1 MG/0.5ML SOAJ, Inject 1 mg into the skin once a week for weight loss, Disp: 2 mL, Rfl: 0   WEGOVY 2.4 MG/0.75ML SOAJ, Inject 2.4 mg into the skin once a week for weight loss., Disp: 3 mL, Rfl: 0   WEGOVY 2.4 MG/0.75ML SOAJ, Inject 2.4 mg into the skin once a week fro weight loss, Disp: 3 mL, Rfl: 0   WEGOVY 2.4 MG/0.75ML SOAJ, Inject 2.4 mg into the skin once a week for weight loss, Disp: 3 mL, Rfl: 0   WEGOVY 2.4 MG/0.75ML SOAJ, Inject 2.4 mg,sub-q, into the skin once a week for weight loss, Disp: 3 mL, Rfl: 0   WEGOVY 2.4 MG/0.75ML SOAJ, Inject 2.4 mg into the skin once a week for weight loss., Disp: 3 mL, Rfl: 0   WEGOVY 2.4 MG/0.75ML SOAJ, Inject 2.4 mg into the skin once a week for weight loss., Disp: 3 mL, Rfl: 0   WEGOVY 2.4 MG/0.75ML SOAJ, Inject 2.4 mg into the skin once a week for weight loss, Disp: 3 mL, Rfl: 0 Medication Side Effects: none  Family Medical/ Social History: Changes? No  MENTAL HEALTH EXAM:  There were no vitals taken for this visit.There is no height or weight on file to calculate BMI.  General  Appearance: Casual and Well Groomed  Eye Contact:  Good  Speech:  Clear and Coherent and Normal Rate  Volume:  Normal  Mood:  Euthymic  Affect:  Congruent  Thought Process:  Goal Directed and Descriptions of Associations: Circumstantial  Orientation:  Full (Time, Place, and Person)  Thought Content: Logical   Suicidal Thoughts:  No  Homicidal Thoughts:  No  Memory:  WNL  Judgement:  Good  Insight:  Good  Psychomotor Activity:  Normal  Concentration:  Concentration: Good  Recall:  Good  Fund of Knowledge: Good  Language: Good  Assets:  Desire for Improvement Financial Resources/Insurance Housing Transportation Vocational/Educational  ADL's:  Intact  Cognition: WNL  Prognosis:  Good   Patient was administered Spravato 84 mg intranasally today.  The patient experienced the typical dissociation which gradually resolved over the 2-hour period of observation.  There were no complications.  Specifically the patient did not have nausea or vomiting or headache.  Blood pressures were slightly elevated at 40 minutes and the 2-hour mark after Spravato was given.  See nurse's note for further details.  By the time the 2-hour observation period was met the patient was alert and oriented and able to exit without assistance.  Patient feels the Spravato administration is helpful for the treatment resistant depression and would like to continue the treatment.     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 Generalized anxiety disorder  F41.1       Receiving Psychotherapy:     RECOMMENDATIONS:   PDMP reviewed.  Spravato filled 10/16/2023. I provided 45 minutes of face to face time during this encounter, including time spent before and after the visit in records review, medical decision making, counseling pertinent to today's visit, and charting.   She did well with Spravato treatment today.  She will continue to  monitor blood pressures at  home and let us know if she has problems.  Continue all current meds as per med list. Return in 1 week.     Robin Overly, Robin Stevenson

## 2023-10-18 NOTE — Progress Notes (Signed)
 NURSES NOTE:   Pt arrived for her # 25 Spravato Treatment for treatment resistant depression, the starting dose was 56 mg (2 of the 28 mg nasal sprays) pt stayed at that dose for the first 4 treatments and also takes Zofran and Meclizine prior to arrival at office. Pt received 84 mg (3 of the 28 mg nasal sprays) total again today and did also premedicate. [facility] had been advised on her treatments and doses needed. Pt sees [name], NP and she will follow her throughout her treatments and follow ups.  Pt's Spravato is ordered through [facility].  Spravato medication is stored at treatment center per REMS/FDA guidelines. The medication is required to be locked behind two doors per REMS/FDA protocol. Medication is also disposed of properly after each use per regulations. All documentation for REMS is completed and submitted per FDA/REMS requirements. Pt's prior authorization was submitted and approved for Spravato 84 mg effective 09/24/2023-09/23/2024. Max daily dose is 0.43         Pt directed to treatment room, started vitals at 10:15 AM, 132/83, pulse 71. Pt did take Zofran 4 mg and Meclizine 25 mg prior to arriving at office, an hour ahead. Instructed patient to blow her nose if needed then recline back to a 45 degree angle if that was more comfortable for her. Gave patient first dose 28 mg nasal spray, administered in each nostril as directed and observed by nurse, waited 5 more minutes for the second and third doses. Placed a fan in her room and left her door open in case she did start to experience the extreme feeling of being hot.  After all doses given pt did not complain of nausea. pt keeps water and some mints to help with the taste of Spravato it gives the metallic taste. Her 40 minute check was at 11:00 AM, Vital signs were 158/100, pulse 73 and 154/96, pulse 72. Pulse ox 96%. Pt aware she would be monitored for a total time of 120 minutes.   Discharge vitals were taken at 12:00 PM 146/88, P 67, Pulse Ox 96%. Pt met with Melony Overly, PA-C while Rene Kocher is working remotely to discuss her treatment. Pt advised to relax the rest of the day. No driving, no intense activities. Verbalized understanding. Pt reports no issues today. Nurse was with pt a total of 60 minutes for clinical assessment. Pt will return on Thursday, March 6th. Pt instructed to call office with any problems or questions prior to next treatment.      LOT [id number] MAY 2027

## 2023-10-19 ENCOUNTER — Other Ambulatory Visit: Payer: Self-pay

## 2023-10-19 NOTE — Progress Notes (Signed)
 Specialty Pharmacy Refill Coordination Note  [name] is a 48 y.o. female assessed today regarding refills of clinic administered specialty medication(s) Esketamine HCl (Spravato (84 MG Dose))   Clinic requested Courier to Provider Office   Delivery date: 10/23/23   Verified address: [facility]   ATTN [name]  [address], [place], [place]   Medication will be filled on 10/23/23.   Per [name], patient doing well. No changes. Appointment 3/6 in AM.

## 2023-10-23 ENCOUNTER — Other Ambulatory Visit: Payer: Self-pay

## 2023-10-23 NOTE — Progress Notes (Signed)
 Spravato 84mg  Shipping 10/23/23 same day courier signature required.   REMS Authorization: [id number]  Healthcare setting DEA: [id number]

## 2023-10-25 ENCOUNTER — Ambulatory Visit (INDEPENDENT_AMBULATORY_CARE_PROVIDER_SITE_OTHER): Payer: Commercial Managed Care - PPO | Admitting: Physician Assistant

## 2023-10-25 ENCOUNTER — Other Ambulatory Visit: Payer: Self-pay

## 2023-10-25 ENCOUNTER — Ambulatory Visit: Payer: Commercial Managed Care - PPO

## 2023-10-25 VITALS — BP 142/90 | HR 71

## 2023-10-25 DIAGNOSIS — F411 Generalized anxiety disorder: Secondary | ICD-10-CM

## 2023-10-25 DIAGNOSIS — F339 Major depressive disorder, recurrent, unspecified: Secondary | ICD-10-CM | POA: Diagnosis not present

## 2023-10-25 NOTE — Progress Notes (Signed)
 NURSES NOTE:   Pt arrived for her # 66 Spravato Treatment for treatment resistant depression, the starting dose was 56 mg (2 of the 28 mg nasal sprays) pt stayed at that dose for the first 4 treatments and also takes Zofran and Meclizine prior to arrival at office. Pt received 84 mg (3 of the 28 mg nasal sprays) total again today and did also premedicate. [facility] had been advised on her treatments and doses needed. Pt sees [name], NP and she will follow her throughout her treatments and follow ups.  Pt's Spravato is ordered through [facility].  Spravato medication is stored at treatment center per REMS/FDA guidelines. The medication is required to be locked behind two doors per REMS/FDA protocol. Medication is also disposed of properly after each use per regulations. All documentation for REMS is completed and submitted per FDA/REMS requirements. Pt's prior authorization was submitted and approved for Spravato 84 mg effective 09/24/2023-09/23/2024. Max daily dose is 0.43         Pt directed to treatment room, started vitals at 10:08 AM, 144/90, pulse 73, sPo2 99%. Pt did take Zofran 4 mg and Meclizine 25 mg prior to arriving at office, an hour ahead. Instructed patient to blow her nose if needed then recline back to a 45 degree angle if that was more comfortable for her. Gave patient first dose 28 mg nasal spray, administered in each nostril as directed and observed by nurse, waited 5 more minutes for the second and third doses. Placed a fan in her room and left her door open in case she did start to experience the extreme feeling of being hot.  After all doses given pt did not complain of nausea. pt keeps water and some mints to help with the taste of Spravato it gives the metallic taste. Her 40 minute check was at 11:00 AM, Vital signs were 144/92, pulse 71, SpO2 99%. Pt aware she would be monitored for a total time of 120 minutes.  Discharge vitals  were taken at 12:10 PM 142/90, P 71, SpO2 99%. Pt met with Melony Overly, PA-C while Rene Kocher is working remotely to discuss her treatment. Pt advised to relax the rest of the day. No driving, no intense activities. Verbalized understanding. Pt reports no issues today. Nurse was with pt a total of 60 minutes for clinical assessment. Pt will return on Monday, March 10th. Pt instructed to call office with any problems or questions prior to next treatment.      LOT [id number] MAY 2027

## 2023-10-25 NOTE — Progress Notes (Signed)
 [facility]  Patient ID: Robin Stevenson,  MRN: [id number]  PCP: Robin Funk, MD (Inactive)  Date of Evaluation: 10/25/2023 Time spent:40 minutes  Chief Complaint:  Chief Complaint   Depression; Other    HISTORY/CURRENT STATUS: HPI for Spravato treatment  Patient of Robin Rack, NP, appointment with me today in [name]'s absence.  Robin Stevenson states she is doing well.  She has Spravato treatment each week.  States it continues to be helpful in the treatment of depression.  She has been under a lot more stress lately due to selling her home and buying/renovating a new home.  States she will feel even better once all that is behind her.   Patient is able to enjoy things.  Energy and motivation are good.  No extreme sadness, tearfulness, or feelings of hopelessness.  Sleeps well most of the time. ADLs and personal hygiene are normal.   Denies any changes in concentration, making decisions, or remembering things.  Appetite has not changed.  Weight is stable.  Reports anxiety from time to time that is situational.  Resolves after the situation is over with.  Denies suicidal or homicidal thoughts.  Patient denies increased energy with decreased need for sleep, increased talkativeness, racing thoughts, impulsivity or risky behaviors, increased spending, increased libido, grandiosity, increased irritability or anger, paranoia, or hallucinations.  Denies dizziness, syncope, seizures, numbness, tingling, tremor, tics, unsteady gait, slurred speech, confusion. Denies muscle or joint pain, stiffness, or dystonia. Denies unexplained weight loss, frequent infections, or sores that heal slowly.  No polyphagia, polydipsia, or polyuria. Denies visual changes or paresthesias.   Individual Medical History/ Review of Systems: Changes? :No   Past medications for mental health diagnoses include: Spravato, Xanax, Wellbutrin, Vraylar, gabapentin, Fetzima, trazodone   Allergies: Cat dander and  Brewers yeast  Current Medications:  Current Outpatient Medications:    ALPRAZolam (XANAX) 0.5 MG tablet, Take 1 tablet at bedtime the night before procedure. Take 1 tablet 1 hour before arrival. Bring third tablet with you to procedure., Disp: 3 tablet, Rfl: 0   atorvastatin (LIPITOR) 20 MG tablet, Take 1 tablet (20 mg total) by mouth daily., Disp: 90 tablet, Rfl: 3   atovaquone-proguanil (MALARONE) 250-100 MG TABS tablet, Take 1 tablet by mouth once daily begin 2 days prearrival,take daily during stay & continue for 7 days posttravel for malaria prevention, Disp: 24 tablet, Rfl: 0   azithromycin (ZITHROMAX) 500 MG tablet, For non bloody diarrhea,take 2 tabs on day 1, if resolved,stop med,If diarrhea persists 1 tab on day 2 & 3. For bloody diarrhea,2 tabs on day 1 & 1 tab on day 2 & 3, Disp: 4 tablet, Rfl: 0   buPROPion (WELLBUTRIN XL) 150 MG 24 hr tablet, , Disp: , Rfl: 3   buPROPion (WELLBUTRIN XL) 150 MG 24 hr tablet, Take 1 tablet (150 mg total) by mouth every morning., Disp: 90 tablet, Rfl: 0   cariprazine (VRAYLAR) 1.5 MG capsule, Take 1 capsule (1.5 mg total) by mouth daily., Disp: 28 capsule, Rfl: 0   celecoxib (CELEBREX) 200 MG capsule, Take 1 capsule by mouth single dose as directed. Take 1 hour prior to arrival for surgery, Disp: 1 capsule, Rfl: 0   Esketamine HCl, 84 MG Dose, (SPRAVATO, 84 MG DOSE,) 28 MG/DEVICE SOPK, Dispense X 3 (28 mg) devices for treatments every 7 days, administer intranasally, Disp: 3 each, Rfl: 3   esomeprazole (NEXIUM) 20 MG capsule, Take 20 mg by mouth once., Disp: , Rfl:    fluocinonide ointment (LIDEX) 0.05 %,  Apply as directed to skin twice a day, Disp: 30 g, Rfl: 0   gabapentin (NEURONTIN) 300 MG capsule, Take 1 capsule (300 mg total) by mouth 3 (three) times daily. Take first dose 1 hour before arrival for surgery., Disp: 21 capsule, Rfl: 0   ketoconazole (NIZORAL) 2 % cream, Apply a small amount to affected area on left foot/toe once a day, Disp: 30 g,  Rfl: 0   meclizine (ANTIVERT) 25 MG tablet, Take 1 tablet (25 mg total) by mouth as directed prior to treatment., Disp: 60 tablet, Rfl: 0   metFORMIN (GLUCOPHAGE) 500 MG tablet, Take 1 tablet (500 mg total) by mouth daily., Disp: 90 tablet, Rfl: 1   metFORMIN (GLUCOPHAGE) 500 MG tablet, Take 1 tablet (500 mg total) by mouth 2 (two) times daily., Disp: 180 tablet, Rfl: 1   ondansetron (ZOFRAN) 4 MG tablet, Take 1 tablet  by mouth every 6 hours as needed for nausea, Disp: 15 tablet, Rfl: 0   Semaglutide-Weight Management (WEGOVY) 2.4 MG/0.75ML SOAJ, Inject 2.4 mg into the skin once a week for weight loss., Disp: 3 mL, Rfl: 0   SUMAtriptan (IMITREX) 50 MG tablet, Take 1 tablet (50 mg total) by mouth at the onset of headache. May repeat 1 dose after 2 hours if needed., Disp: 10 tablet, Rfl: 5   traZODone (DESYREL) 50 MG tablet, Take 50 mg by mouth at bedtime., Disp: , Rfl:    Esketamine HCl, 56 MG Dose, (SPRAVATO, 56 MG DOSE,) 28 MG/DEVICE SOPK, Dispense X 2 (28mg ) devices, administer on day 1 intranasally as directed, Disp: 4 each, Rfl: 0   FETZIMA 120 MG CP24, , Disp: , Rfl: 3   Levomilnacipran HCl ER (FETZIMA) 120 MG CP24, Take 1 capsule by mouth daily., Disp: 90 capsule, Rfl: 3   metFORMIN (GLUCOPHAGE) 500 MG tablet, Take 1 tablet (500 mg total) by mouth 2 (two) times daily., Disp: 90 tablet, Rfl: 1   metFORMIN (GLUCOPHAGE-XR) 500 MG 24 hr tablet, Take 1 tablet (500 mg total) by mouth daily. Please do not fill if not picked up within 30 days., Disp: 30 tablet, Rfl: 0   methocarbamol (ROBAXIN) 500 MG tablet, Take 1 tablet (500 mg total) by mouth every 6 (six) hours as needed for muscle spasms. (Patient not taking: Reported on 10/29/2023), Disp: 45 tablet, Rfl: 0   ondansetron (ZOFRAN) 4 MG tablet, Take 1 tablet (4 mg total) by mouth every 6 (six) hours for nausea. TAKE 4 tablets 15 minutes prior to arrival for surgery, Disp: 30 tablet, Rfl: 0   ondansetron (ZOFRAN-ODT) 4 MG disintegrating tablet,  Dissolve 1 tablet (4 mg total) by mouth every 8 (eight) hours as needed for nausea or vomiting., Disp: 20 tablet, Rfl: 1   oxyCODONE (OXY IR/ROXICODONE) 5 MG immediate release tablet, Take 1-2 tablet by mouth every six to eight hours as needed for Post Op Pain. DO NOT EXCEED  6 tablets in 24 hours., Disp: 30 tablet, Rfl: 0   polyethylene glycol (MIRALAX / GLYCOLAX) packet, Take 17 g by mouth daily., Disp: , Rfl:    polyethylene glycol-electrolytes (NULYTELY) 420 g solution, Take 4,000 mLs by mouth Nightly., Disp: 4000 mL, Rfl: 0   sulfamethoxazole-trimethoprim (BACTRIM DS) 800-160 MG tablet, Take 1 tablet by mouth 2 (two) times daily as directed. Start the day after surgery., Disp: 10 tablet, Rfl: 0   SUMAtriptan (IMITREX) 50 MG tablet, TAKE 1 TABLET BY MOUTH AT ONSET OF HEADACHE, MAY REPEAT 1 DOSE AFTER 2 HOURS IF NEEDED, Disp: 10  tablet, Rfl: 5   SUMAtriptan (IMITREX) 50 MG tablet, Take 1 tablet by mouth at onset of headache, may repeat same dose once 2 hours later if needed, Disp: 10 tablet, Rfl: 5   SUMAtriptan (IMITREX) 50 MG tablet, Take 1 tablet (50 mg total) by mouth at onset of headache,may repeat 1 dose after 2 hours if needed, 2 to 3 times a week, Disp: 9 tablet, Rfl: 6   terbinafine (LAMISIL) 250 MG tablet, Take 1 tablet (250 mg total) by mouth daily for Tinea, Disp: 14 tablet, Rfl: 0   traZODone (DESYREL) 50 MG tablet, TAKE 1 TO 2 TABLETS BY MOUTH AT BEDTIME, Disp: 180 tablet, Rfl: 1   traZODone (DESYREL) 50 MG tablet, Take 1-2 tablets (50-100 mg total) by mouth at bedtime., Disp: 180 tablet, Rfl: 1   traZODone (DESYREL) 50 MG tablet, Take 1-2 tablets (50-100 mg total) by mouth at bedtime., Disp: 180 tablet, Rfl: 2   WEGOVY 0.5 MG/0.5ML SOAJ, Inject 0.5 mg into the skin once a week for weight loss., Disp: 2 mL, Rfl: 0   WEGOVY 1 MG/0.5ML SOAJ, Inject 1 mg into the skin once a week for weight loss, Disp: 2 mL, Rfl: 0   WEGOVY 2.4 MG/0.75ML SOAJ, Inject 2.4 mg into the skin once a week for  weight loss., Disp: 3 mL, Rfl: 0   WEGOVY 2.4 MG/0.75ML SOAJ, Inject 2.4 mg into the skin once a week fro weight loss, Disp: 3 mL, Rfl: 0   WEGOVY 2.4 MG/0.75ML SOAJ, Inject 2.4 mg into the skin once a week for weight loss, Disp: 3 mL, Rfl: 0   WEGOVY 2.4 MG/0.75ML SOAJ, Inject 2.4 mg,sub-q, into the skin once a week for weight loss, Disp: 3 mL, Rfl: 0   WEGOVY 2.4 MG/0.75ML SOAJ, Inject 2.4 mg into the skin once a week for weight loss., Disp: 3 mL, Rfl: 0   WEGOVY 2.4 MG/0.75ML SOAJ, Inject 2.4 mg into the skin once a week for weight loss., Disp: 3 mL, Rfl: 0   WEGOVY 2.4 MG/0.75ML SOAJ, Inject 2.4 mg into the skin once a week for weight loss, Disp: 3 mL, Rfl: 0 Medication Side Effects: none  Family Medical/ Social History: Changes? No  MENTAL HEALTH EXAM:  There were no vitals taken for this visit.There is no height or weight on file to calculate BMI.  General Appearance: Casual and Well Groomed  Eye Contact:  Good  Speech:  Clear and Coherent and Normal Rate  Volume:  Normal  Mood:  Euthymic  Affect:  Congruent  Thought Process:  Goal Directed and Descriptions of Associations: Circumstantial  Orientation:  Full (Time, Place, and Person)  Thought Content: Logical   Suicidal Thoughts:  No  Homicidal Thoughts:  No  Memory:  WNL  Judgement:  Good  Insight:  Good  Psychomotor Activity:  Normal  Concentration:  Concentration: Good  Recall:  Good  Fund of Knowledge: Good  Language: Good  Assets:  Desire for Improvement Financial Resources/Insurance Housing Transportation Vocational/Educational  ADL's:  Intact  Cognition: WNL  Prognosis:  Good   Patient was administered Spravato 84 mg intranasally today.  The patient experienced the typical dissociation which gradually resolved over the 2-hour period of observation.  There were no complications.  Specifically the patient did not have nausea or vomiting or headache.  Blood pressures were slightly elevated at 40 minutes and the  2-hour mark after Spravato was given.  See nurse's note for further details.  By the time the 2-hour observation  period was met the patient was alert and oriented and able to exit without assistance.  Patient feels the Spravato administration is helpful for the treatment resistant depression and would like to continue the treatment.     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 Generalized anxiety disorder  F41.1       Receiving Psychotherapy: No   RECOMMENDATIONS:   PDMP reviewed.  Spravato filled 10/16/2023. I provided 45 minutes of face to face time during this encounter, including time spent before and after the visit in records review, medical decision making, counseling pertinent to today's visit, and charting.   She did well with Spravato treatment today.  She will continue to monitor blood pressures at home and let us know if she has problems.  Continue all current meds as per med list. Return in 1 week.  Melony Overly, PA-C

## 2023-10-26 ENCOUNTER — Other Ambulatory Visit: Payer: Self-pay

## 2023-10-26 NOTE — Progress Notes (Signed)
 Specialty Pharmacy Refill Coordination Note  Robin Stevenson is a 48 y.o. female contacted today regarding refills of specialty medication(s) Esketamine HCl (Spravato (84 MG Dose))   Patient requested Courier to Provider Office   Delivery date: 10/29/23   Verified address: [facility]   ATTN [name]  [address], [place], [place]   Medication will be filled on 10/29/23.   Per [name], patient continues to do well on once weekly dosing. Appt is 3/11.

## 2023-10-27 ENCOUNTER — Other Ambulatory Visit (HOSPITAL_COMMUNITY): Payer: Self-pay

## 2023-10-29 ENCOUNTER — Other Ambulatory Visit: Payer: Self-pay

## 2023-10-29 ENCOUNTER — Encounter: Payer: Self-pay | Admitting: Physician Assistant

## 2023-10-29 ENCOUNTER — Ambulatory Visit

## 2023-10-29 ENCOUNTER — Ambulatory Visit (INDEPENDENT_AMBULATORY_CARE_PROVIDER_SITE_OTHER): Admitting: Physician Assistant

## 2023-10-29 VITALS — BP 131/91 | HR 68

## 2023-10-29 DIAGNOSIS — F339 Major depressive disorder, recurrent, unspecified: Secondary | ICD-10-CM | POA: Diagnosis not present

## 2023-10-29 DIAGNOSIS — F411 Generalized anxiety disorder: Secondary | ICD-10-CM

## 2023-10-29 NOTE — Progress Notes (Signed)
 [facility]  Patient ID: Robin Stevenson,  MRN: [id number]  PCP: [name], MD (Inactive)  Date of Evaluation: 10/29/2023 Time spent:50 minutes  Chief Complaint:  Chief Complaint   Depression; Other    HISTORY/CURRENT STATUS: HPI for Spravato treatment, she sees Robin Rack, NP who is out of the office today.  Robin Stevenson is doing well, Spravato is still helpful.  Energy and motivation are good.  Denies feelings of hopelessness.  ADLs and personal hygiene are normal.  No change in focus or memory.  Appetite has not changed.  Weight is stable.  Sleeps well most of the time.  Denies suicidal or homicidal thoughts.  BP checks at home are within normal range.  She is still under some stress with the selling of 1 home and buying another.  Denies dizziness, syncope, seizures, numbness, tingling, tremor, tics, unsteady gait, slurred speech, confusion. Denies muscle or joint pain, stiffness, or dystonia. Denies unexplained weight loss, frequent infections, or sores that heal slowly.  No polyphagia, polydipsia, or polyuria. Denies visual changes or paresthesias.   Individual Medical History/ Review of Systems: Changes? :No   Past medications for mental health diagnoses include: Spravato, Xanax, Wellbutrin, Vraylar, gabapentin, Fetzima, trazodone   Allergies: Cat dander and Brewers yeast  Current Medications:  Current Outpatient Medications:    ALPRAZolam (XANAX) 0.5 MG tablet, Take 1 tablet at bedtime the night before procedure. Take 1 tablet 1 hour before arrival. Bring third tablet with you to procedure., Disp: 3 tablet, Rfl: 0   atorvastatin (LIPITOR) 20 MG tablet, Take 1 tablet (20 mg total) by mouth daily., Disp: 90 tablet, Rfl: 3   azithromycin (ZITHROMAX) 500 MG tablet, For non bloody diarrhea,take 2 tabs on day 1, if resolved,stop med,If diarrhea persists 1 tab on day 2 & 3. For bloody diarrhea,2 tabs on day 1 & 1 tab on day 2 & 3, Disp: 4 tablet, Rfl: 0   buPROPion  (WELLBUTRIN XL) 150 MG 24 hr tablet, , Disp: , Rfl: 3   buPROPion (WELLBUTRIN XL) 150 MG 24 hr tablet, Take 1 tablet (150 mg total) by mouth every morning., Disp: 90 tablet, Rfl: 0   cariprazine (VRAYLAR) 1.5 MG capsule, Take 1 capsule (1.5 mg total) by mouth daily., Disp: 28 capsule, Rfl: 0   Esketamine HCl, 56 MG Dose, (SPRAVATO, 56 MG DOSE,) 28 MG/DEVICE SOPK, Dispense X 2 (28mg ) devices, administer on day 1 intranasally as directed, Disp: 4 each, Rfl: 0   Esketamine HCl, 84 MG Dose, (SPRAVATO, 84 MG DOSE,) 28 MG/DEVICE SOPK, Dispense X 3 (28 mg) devices for treatments every 7 days, administer intranasally, Disp: 3 each, Rfl: 3   esomeprazole (NEXIUM) 20 MG capsule, Take 20 mg by mouth once., Disp: , Rfl:    gabapentin (NEURONTIN) 300 MG capsule, Take 1 capsule (300 mg total) by mouth 3 (three) times daily. Take first dose 1 hour before arrival for surgery., Disp: 21 capsule, Rfl: 0   Levomilnacipran HCl ER (FETZIMA) 120 MG CP24, Take 1 capsule by mouth daily., Disp: 90 capsule, Rfl: 3   meclizine (ANTIVERT) 25 MG tablet, Take 1 tablet (25 mg total) by mouth as directed prior to treatment., Disp: 60 tablet, Rfl: 0   metFORMIN (GLUCOPHAGE) 500 MG tablet, Take 1 tablet (500 mg total) by mouth daily., Disp: 90 tablet, Rfl: 1   SUMAtriptan (IMITREX) 50 MG tablet, Take 1 tablet (50 mg total) by mouth at onset of headache,may repeat 1 dose after 2 hours if needed, 2 to 3 times  a week, Disp: 9 tablet, Rfl: 6   traZODone (DESYREL) 50 MG tablet, Take 1-2 tablets (50-100 mg total) by mouth at bedtime., Disp: 180 tablet, Rfl: 2   atovaquone-proguanil (MALARONE) 250-100 MG TABS tablet, Take 1 tablet by mouth once daily begin 2 days prearrival,take daily during stay & continue for 7 days posttravel for malaria prevention, Disp: 24 tablet, Rfl: 0   celecoxib (CELEBREX) 200 MG capsule, Take 1 capsule by mouth single dose as directed. Take 1 hour prior to arrival for surgery, Disp: 1 capsule, Rfl: 0   FETZIMA 120  MG CP24, , Disp: , Rfl: 3   fluocinonide ointment (LIDEX) 0.05 %, Apply as directed to skin twice a day, Disp: 30 g, Rfl: 0   ketoconazole (NIZORAL) 2 % cream, Apply a small amount to affected area on left foot/toe once a day, Disp: 30 g, Rfl: 0   metFORMIN (GLUCOPHAGE) 500 MG tablet, Take 1 tablet (500 mg total) by mouth 2 (two) times daily., Disp: 180 tablet, Rfl: 1   metFORMIN (GLUCOPHAGE) 500 MG tablet, Take 1 tablet (500 mg total) by mouth 2 (two) times daily., Disp: 90 tablet, Rfl: 1   metFORMIN (GLUCOPHAGE-XR) 500 MG 24 hr tablet, Take 1 tablet (500 mg total) by mouth daily. Please do not fill if not picked up within 30 days., Disp: 30 tablet, Rfl: 0   methocarbamol (ROBAXIN) 500 MG tablet, Take 1 tablet (500 mg total) by mouth every 6 (six) hours as needed for muscle spasms. (Patient not taking: Reported on 10/29/2023), Disp: 45 tablet, Rfl: 0   ondansetron (ZOFRAN) 4 MG tablet, Take 1 tablet  by mouth every 6 hours as needed for nausea, Disp: 15 tablet, Rfl: 0   ondansetron (ZOFRAN) 4 MG tablet, Take 1 tablet (4 mg total) by mouth every 6 (six) hours for nausea. TAKE 4 tablets 15 minutes prior to arrival for surgery, Disp: 30 tablet, Rfl: 0   ondansetron (ZOFRAN-ODT) 4 MG disintegrating tablet, Dissolve 1 tablet (4 mg total) by mouth every 8 (eight) hours as needed for nausea or vomiting., Disp: 20 tablet, Rfl: 1   oxyCODONE (OXY IR/ROXICODONE) 5 MG immediate release tablet, Take 1-2 tablet by mouth every six to eight hours as needed for Post Op Pain. DO NOT EXCEED  6 tablets in 24 hours., Disp: 30 tablet, Rfl: 0   polyethylene glycol (MIRALAX / GLYCOLAX) packet, Take 17 g by mouth daily., Disp: , Rfl:    polyethylene glycol-electrolytes (NULYTELY) 420 g solution, Take 4,000 mLs by mouth Nightly., Disp: 4000 mL, Rfl: 0   Semaglutide-Weight Management (WEGOVY) 2.4 MG/0.75ML SOAJ, Inject 2.4 mg into the skin once a week for weight loss., Disp: 3 mL, Rfl: 0   sulfamethoxazole-trimethoprim (BACTRIM  DS) 800-160 MG tablet, Take 1 tablet by mouth 2 (two) times daily as directed. Start the day after surgery., Disp: 10 tablet, Rfl: 0   SUMAtriptan (IMITREX) 50 MG tablet, TAKE 1 TABLET BY MOUTH AT ONSET OF HEADACHE, MAY REPEAT 1 DOSE AFTER 2 HOURS IF NEEDED, Disp: 10 tablet, Rfl: 5   SUMAtriptan (IMITREX) 50 MG tablet, Take 1 tablet by mouth at onset of headache, may repeat same dose once 2 hours later if needed, Disp: 10 tablet, Rfl: 5   SUMAtriptan (IMITREX) 50 MG tablet, Take 1 tablet (50 mg total) by mouth at the onset of headache. May repeat 1 dose after 2 hours if needed., Disp: 10 tablet, Rfl: 5   terbinafine (LAMISIL) 250 MG tablet, Take 1 tablet (250 mg total)  by mouth daily for Tinea, Disp: 14 tablet, Rfl: 0   traZODone (DESYREL) 50 MG tablet, Take 50 mg by mouth at bedtime., Disp: , Rfl:    traZODone (DESYREL) 50 MG tablet, TAKE 1 TO 2 TABLETS BY MOUTH AT BEDTIME, Disp: 180 tablet, Rfl: 1   traZODone (DESYREL) 50 MG tablet, Take 1-2 tablets (50-100 mg total) by mouth at bedtime., Disp: 180 tablet, Rfl: 1   WEGOVY 0.5 MG/0.5ML SOAJ, Inject 0.5 mg into the skin once a week for weight loss., Disp: 2 mL, Rfl: 0   WEGOVY 1 MG/0.5ML SOAJ, Inject 1 mg into the skin once a week for weight loss, Disp: 2 mL, Rfl: 0   WEGOVY 2.4 MG/0.75ML SOAJ, Inject 2.4 mg into the skin once a week for weight loss., Disp: 3 mL, Rfl: 0   WEGOVY 2.4 MG/0.75ML SOAJ, Inject 2.4 mg into the skin once a week fro weight loss, Disp: 3 mL, Rfl: 0   WEGOVY 2.4 MG/0.75ML SOAJ, Inject 2.4 mg into the skin once a week for weight loss, Disp: 3 mL, Rfl: 0   WEGOVY 2.4 MG/0.75ML SOAJ, Inject 2.4 mg,sub-q, into the skin once a week for weight loss, Disp: 3 mL, Rfl: 0   WEGOVY 2.4 MG/0.75ML SOAJ, Inject 2.4 mg into the skin once a week for weight loss., Disp: 3 mL, Rfl: 0   WEGOVY 2.4 MG/0.75ML SOAJ, Inject 2.4 mg into the skin once a week for weight loss., Disp: 3 mL, Rfl: 0   WEGOVY 2.4 MG/0.75ML SOAJ, Inject 2.4 mg into the skin  once a week for weight loss, Disp: 3 mL, Rfl: 0 Medication Side Effects: none  Family Medical/ Social History: Changes? No  MENTAL HEALTH EXAM:  There were no vitals taken for this visit.There is no height or weight on file to calculate BMI.  General Appearance: Casual and Well Groomed  Eye Contact:  Good  Speech:  Clear and Coherent and Normal Rate  Volume:  Normal  Mood:  Euthymic  Affect:  Congruent  Thought Process:  Goal Directed and Descriptions of Associations: Circumstantial  Orientation:  Full (Time, Place, and Person)  Thought Content: Logical   Suicidal Thoughts:  No  Homicidal Thoughts:  No  Memory:  WNL  Judgement:  Good  Insight:  Good  Psychomotor Activity:  Normal  Concentration:  Concentration: Good and Attention Span: Good  Recall:  Good  Fund of Knowledge: Good  Language: Good  Assets:  Desire for Improvement Financial Resources/Insurance Housing Transportation Vocational/Educational  ADL's:  Intact  Cognition: WNL  Prognosis:  Good   Patient was administered Spravato 84 mg intranasally today.  The patient experienced the typical dissociation which gradually resolved over the 2-hour period of observation.  There were no complications.  Specifically the patient did not have nausea or vomiting or headache.  Blood pressures were slightly elevated at 40 minutes and the 2-hour mark after Spravato was given.  See nurse's note for further details.  By the time the 2-hour observation period was met the patient was alert and oriented and able to exit without assistance.  Patient feels the Spravato administration is helpful for the treatment resistant depression and would like to continue the treatment.     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 Generalized anxiety disorder  F41.1       Receiving Psychotherapy: No   RECOMMENDATIONS:   PDMP reviewed.  Spravato filled 10/23/2023.  I provided 50 minutes  of face to face time during this encounter, including time spent before and after the visit in records review, medical decision making, counseling pertinent to today's visit, and charting.   She continues to respond well to the Spravato so no changes will be made.  Continue all current meds as per med list. Return in 1 week.  Melony Overly, PA-C

## 2023-10-29 NOTE — Progress Notes (Signed)
 10/29/23 REMS Authorization Code: Robin Stevenson

## 2023-10-29 NOTE — Progress Notes (Signed)
 NURSES NOTE:   Pt arrived for her # 71 Spravato Treatment for treatment resistant depression, the starting dose was 56 mg (2 of the 28 mg nasal sprays) pt stayed at that dose for the first 4 treatments and also takes Zofran and Meclizine prior to arrival at office. Pt received 84 mg (3 of the 28 mg nasal sprays) total again today and did also premedicate. [facility] had been advised on her treatments and doses needed. Pt sees [name], NP and she will follow her throughout her treatments and follow ups.  Pt's Spravato is ordered through [facility].  Spravato medication is stored at treatment center per REMS/FDA guidelines. The medication is required to be locked behind two doors per REMS/FDA protocol. Medication is also disposed of properly after each use per regulations. All documentation for REMS is completed and submitted per FDA/REMS requirements. Pt's prior authorization was submitted and approved for Spravato 84 mg effective 09/24/2023-09/23/2024. Max daily dose is 0.43         Pt directed to treatment room, started vitals at 10:10 AM, 131/84, pulse 70, SpO2 99%. Pt did take Zofran 4 mg and Meclizine 25 mg prior to arriving at office, an hour ahead. Instructed patient to blow her nose if needed then recline back to a 45 degree angle if that was more comfortable for her. Gave patient first dose 28 mg nasal spray, administered in each nostril as directed and observed by nurse, waited 5 more minutes for the second and third doses. Placed a fan in her room and left her door open in case she did start to experience the extreme feeling of being hot.  After all doses given pt did not complain of nausea. pt keeps water and some mints to help with the taste of Spravato it gives the metallic taste. Her 40 minute check was at 11:00 AM, Vital signs were 125/93, pulse 72, SpO2 99%. Pt aware she would be monitored for a total time of 120 minutes.  Discharge vitals  were taken at 12:02 PM 131/91, P 68, SpO2 95%. Pt met with Melony Overly, PA-C while Rene Kocher is working remotely to discuss her treatment. Pt advised to relax the rest of the day. No driving, no intense activities. Verbalized understanding. Pt reports no issues today. Nurse was with pt a total of 60 minutes for clinical assessment. Pt will return on Tuesday, March 18th. Pt instructed to call office with any problems or questions prior to next treatment.      LOT [id number]

## 2023-10-31 ENCOUNTER — Other Ambulatory Visit: Payer: Self-pay

## 2023-11-01 ENCOUNTER — Other Ambulatory Visit: Payer: Self-pay

## 2023-11-01 ENCOUNTER — Other Ambulatory Visit (HOSPITAL_COMMUNITY): Payer: Self-pay

## 2023-11-01 MED ORDER — SPRAVATO (84 MG DOSE) 28 MG/DEVICE NA SOPK
PACK | NASAL | 3 refills | Status: DC
Start: 2023-11-01 — End: 2023-11-29
  Filled 2023-11-01: qty 3, 7d supply, fill #0
  Filled 2023-11-07: qty 3, 7d supply, fill #1
  Filled 2023-11-14: qty 3, 7d supply, fill #2
  Filled 2023-11-22: qty 3, 7d supply, fill #3

## 2023-11-01 NOTE — Progress Notes (Signed)
 Specialty Pharmacy Refill Coordination Note  Robin Stevenson is a 48 y.o. female contacted today regarding refills of specialty medication(s) Esketamine HCl (Spravato (84 MG Dose))   Patient requested Delivery   Delivery date: 11/05/23   Verified address: [facility] [address], [address], [place], [place]   Medication will be filled on 11/05/23.   Same day courier, signature required, ATTN: [name]    Per [name], patient continues to do well on therapy, next appt is 11/06/23.

## 2023-11-02 ENCOUNTER — Other Ambulatory Visit (HOSPITAL_COMMUNITY): Payer: Self-pay

## 2023-11-03 ENCOUNTER — Other Ambulatory Visit (HOSPITAL_COMMUNITY): Payer: Self-pay

## 2023-11-05 ENCOUNTER — Other Ambulatory Visit: Payer: Self-pay

## 2023-11-05 NOTE — Progress Notes (Signed)
 11/05/23 REMS Authorization Code: [id number]

## 2023-11-06 ENCOUNTER — Encounter: Payer: Self-pay | Admitting: Adult Health

## 2023-11-06 ENCOUNTER — Ambulatory Visit

## 2023-11-06 ENCOUNTER — Ambulatory Visit: Admitting: Adult Health

## 2023-11-06 VITALS — BP 152/94 | HR 65

## 2023-11-06 DIAGNOSIS — F339 Major depressive disorder, recurrent, unspecified: Secondary | ICD-10-CM

## 2023-11-06 NOTE — Progress Notes (Signed)
 Robin Stevenson [id number] 08/30/1975 48 y.o.  Subjective:   Patient ID:  Robin Stevenson is a 48 y.o. (DOB 1976/06/07) female.  Chief Complaint: No chief complaint on file.   HPI Robin Stevenson presents to the office today for follow-up of treatment resistant depression (TRD).  Robin Stevenson reports doing well with current treatment regimen. She reports stable energy and motivation. She denies feelings of hopelessness. She reports completing ADLs and personal hygiene. She reports stable focus and concentration. Appetite is adquate and her weight is stable. She reports sleeping well most nights. Denies suicidal or homicidal thoughts.  BP checks at home are within normal range.  She is still under some stress with the selling of 1 home and buying another.   Spravato treatment    Patient was administered Spravato 84 mg intranasally today. Patient was observed by provider throughout [facility] treatment. The patient experienced the typical dissociation which gradually resolved over the 2-hour period of observation. She did not feel sleepy, decreased feeling of sensitivity (numbness), lack of energy, increased blood pressure, feeling happy or very excited, or headache. She denied feeling disconnected from themself, their thoughts, feelings and things around them. Blood pressures remained within normal ranges at the 40-minute and 2-hour follow-up intervals. By the time the 2-hour observation period was met the patient was alert and oriented and able to exit without assistance. Patient willing to continue Spravato administration for the treatment of resistant depression. See nursing note for further details.   Review of Systems:  Review of Systems  Musculoskeletal:  Negative for gait problem.  Neurological:  Negative for tremors.  Psychiatric/Behavioral:         Please refer to HPI    Medications: I have reviewed the patient's current medications.  Current Outpatient Medications   Medication Sig Dispense Refill   ALPRAZolam (XANAX) 0.5 MG tablet Take 1 tablet at bedtime the night before procedure. Take 1 tablet 1 hour before arrival. Bring third tablet with you to procedure. 3 tablet 0   atorvastatin (LIPITOR) 20 MG tablet Take 1 tablet (20 mg total) by mouth daily. 90 tablet 3   atovaquone-proguanil (MALARONE) 250-100 MG TABS tablet Take 1 tablet by mouth once daily begin 2 days prearrival,take daily during stay & continue for 7 days posttravel for malaria prevention 24 tablet 0   azithromycin (ZITHROMAX) 500 MG tablet For non bloody diarrhea,take 2 tabs on day 1, if resolved,stop med,If diarrhea persists 1 tab on day 2 & 3. For bloody diarrhea,2 tabs on day 1 & 1 tab on day 2 & 3 4 tablet 0   buPROPion (WELLBUTRIN XL) 150 MG 24 hr tablet   3   buPROPion (WELLBUTRIN XL) 150 MG 24 hr tablet Take 1 tablet (150 mg total) by mouth every morning. 90 tablet 0   cariprazine (VRAYLAR) 1.5 MG capsule Take 1 capsule (1.5 mg total) by mouth daily. 28 capsule 0   celecoxib (CELEBREX) 200 MG capsule Take 1 capsule by mouth single dose as directed. Take 1 hour prior to arrival for surgery 1 capsule 0   Esketamine HCl, 56 MG Dose, (SPRAVATO, 56 MG DOSE,) 28 MG/DEVICE SOPK Dispense X 2 (28mg ) devices, administer on day 1 intranasally as directed 4 each 0   Esketamine HCl, 84 MG Dose, (SPRAVATO, 84 MG DOSE,) 28 MG/DEVICE SOPK Dispense X 3 (28 mg) devices for treatments every 7 days, administer intranasally 3 each 3   esomeprazole (NEXIUM) 20 MG capsule Take 20 mg by mouth once.  FETZIMA 120 MG CP24   3   fluocinonide ointment (LIDEX) 0.05 % Apply as directed to skin twice a day 30 g 0   gabapentin (NEURONTIN) 300 MG capsule Take 1 capsule (300 mg total) by mouth 3 (three) times daily. Take first dose 1 hour before arrival for surgery. 21 capsule 0   ketoconazole (NIZORAL) 2 % cream Apply a small amount to affected area on left foot/toe once a day 30 g 0   Levomilnacipran HCl ER (FETZIMA)  120 MG CP24 Take 1 capsule by mouth daily. 90 capsule 3   meclizine (ANTIVERT) 25 MG tablet Take 1 tablet (25 mg total) by mouth as directed prior to treatment. 60 tablet 0   metFORMIN (GLUCOPHAGE) 500 MG tablet Take 1 tablet (500 mg total) by mouth daily. 90 tablet 1   metFORMIN (GLUCOPHAGE) 500 MG tablet Take 1 tablet (500 mg total) by mouth 2 (two) times daily. 180 tablet 1   metFORMIN (GLUCOPHAGE) 500 MG tablet Take 1 tablet (500 mg total) by mouth 2 (two) times daily. 90 tablet 1   metFORMIN (GLUCOPHAGE-XR) 500 MG 24 hr tablet Take 1 tablet (500 mg total) by mouth daily. Please do not fill if not picked up within 30 days. 30 tablet 0   methocarbamol (ROBAXIN) 500 MG tablet Take 1 tablet (500 mg total) by mouth every 6 (six) hours as needed for muscle spasms. (Patient not taking: Reported on 10/29/2023) 45 tablet 0   ondansetron (ZOFRAN) 4 MG tablet Take 1 tablet  by mouth every 6 hours as needed for nausea 15 tablet 0   ondansetron (ZOFRAN) 4 MG tablet Take 1 tablet (4 mg total) by mouth every 6 (six) hours for nausea. TAKE 4 tablets 15 minutes prior to arrival for surgery 30 tablet 0   ondansetron (ZOFRAN-ODT) 4 MG disintegrating tablet Dissolve 1 tablet (4 mg total) by mouth every 8 (eight) hours as needed for nausea or vomiting. 20 tablet 1   oxyCODONE (OXY IR/ROXICODONE) 5 MG immediate release tablet Take 1-2 tablet by mouth every six to eight hours as needed for Post Op Pain. DO NOT EXCEED  6 tablets in 24 hours. 30 tablet 0   polyethylene glycol (MIRALAX / GLYCOLAX) packet Take 17 g by mouth daily.     polyethylene glycol-electrolytes (NULYTELY) 420 g solution Take 4,000 mLs by mouth Nightly. 4000 mL 0   Semaglutide-Weight Management (WEGOVY) 2.4 MG/0.75ML SOAJ Inject 2.4 mg into the skin once a week for weight loss. 3 mL 0   sulfamethoxazole-trimethoprim (BACTRIM DS) 800-160 MG tablet Take 1 tablet by mouth 2 (two) times daily as directed. Start the day after surgery. 10 tablet 0    SUMAtriptan (IMITREX) 50 MG tablet TAKE 1 TABLET BY MOUTH AT ONSET OF HEADACHE, MAY REPEAT 1 DOSE AFTER 2 HOURS IF NEEDED 10 tablet 5   SUMAtriptan (IMITREX) 50 MG tablet Take 1 tablet by mouth at onset of headache, may repeat same dose once 2 hours later if needed 10 tablet 5   SUMAtriptan (IMITREX) 50 MG tablet Take 1 tablet (50 mg total) by mouth at the onset of headache. May repeat 1 dose after 2 hours if needed. 10 tablet 5   SUMAtriptan (IMITREX) 50 MG tablet Take 1 tablet (50 mg total) by mouth at onset of headache,may repeat 1 dose after 2 hours if needed, 2 to 3 times a week 9 tablet 6   terbinafine (LAMISIL) 250 MG tablet Take 1 tablet (250 mg total) by mouth daily for Tinea  14 tablet 0   traZODone (DESYREL) 50 MG tablet Take 50 mg by mouth at bedtime.     traZODone (DESYREL) 50 MG tablet TAKE 1 TO 2 TABLETS BY MOUTH AT BEDTIME 180 tablet 1   traZODone (DESYREL) 50 MG tablet Take 1-2 tablets (50-100 mg total) by mouth at bedtime. 180 tablet 1   traZODone (DESYREL) 50 MG tablet Take 1-2 tablets (50-100 mg total) by mouth at bedtime. 180 tablet 2   WEGOVY 0.5 MG/0.5ML SOAJ Inject 0.5 mg into the skin once a week for weight loss. 2 mL 0   WEGOVY 1 MG/0.5ML SOAJ Inject 1 mg into the skin once a week for weight loss 2 mL 0   WEGOVY 2.4 MG/0.75ML SOAJ Inject 2.4 mg into the skin once a week for weight loss. 3 mL 0   WEGOVY 2.4 MG/0.75ML SOAJ Inject 2.4 mg into the skin once a week fro weight loss 3 mL 0   WEGOVY 2.4 MG/0.75ML SOAJ Inject 2.4 mg into the skin once a week for weight loss 3 mL 0   WEGOVY 2.4 MG/0.75ML SOAJ Inject 2.4 mg,sub-q, into the skin once a week for weight loss 3 mL 0   WEGOVY 2.4 MG/0.75ML SOAJ Inject 2.4 mg into the skin once a week for weight loss. 3 mL 0   WEGOVY 2.4 MG/0.75ML SOAJ Inject 2.4 mg into the skin once a week for weight loss. 3 mL 0   WEGOVY 2.4 MG/0.75ML SOAJ Inject 2.4 mg into the skin once a week for weight loss 3 mL 0   No current facility-administered  medications for this visit.    Medication Side Effects: None  Allergies:  Allergies  Allergen Reactions   Cat Dander Anaphylaxis   Brewers Yeast Rash    Past Medical History:  Diagnosis Date   Depression    takes Welbutrin and Fetzima   GERD (gastroesophageal reflux disease)    takes nexium    Past Medical History, Surgical history, Social history, and Family history were reviewed and updated as appropriate.   Please see review of systems for further details on the patient's review from today.   Objective:   Physical Exam:  There were no vitals taken for this visit.  Physical Exam Constitutional:      General: She is not in acute distress. Musculoskeletal:        General: No deformity.  Neurological:     Mental Status: She is alert and oriented to person, place, and time.     Coordination: Coordination normal.  Psychiatric:        Attention and Perception: Attention and perception normal. She does not perceive auditory or visual hallucinations.        Mood and Affect: Affect is not labile, blunt, angry or inappropriate.        Speech: Speech normal.        Behavior: Behavior normal.        Thought Content: Thought content normal. Thought content is not paranoid or delusional. Thought content does not include homicidal or suicidal ideation. Thought content does not include homicidal or suicidal plan.        Cognition and Memory: Cognition and memory normal.        Judgment: Judgment normal.     Comments: Insight intact     Lab Review:     Component Value Date/Time   NA 140 09/01/2016 0852   K 4.4 09/01/2016 0852   CL 104 09/01/2016 0852   CO2 31 09/01/2016  6213   GLUCOSE 90 09/01/2016 0852   BUN 14 09/01/2016 0852   CREATININE 0.86 09/01/2016 0852   CALCIUM 9.2 09/01/2016 0852   PROT 7.0 09/01/2016 0852   ALBUMIN 4.3 09/01/2016 0852   AST 33 09/01/2016 0852   ALT 70 (H) 09/01/2016 0852   ALKPHOS 66 09/01/2016 0852   BILITOT 0.7 09/01/2016 0852        Component Value Date/Time   WBC 5.1 09/01/2016 0852   RBC 4.30 09/01/2016 0852   HGB 13.5 09/01/2016 0852   HCT 38.9 09/01/2016 0852   PLT 286.0 09/01/2016 0852   MCV 90.4 09/01/2016 0852   MCHC 34.6 09/01/2016 0852   RDW 11.9 09/01/2016 0852   LYMPHSABS 1.5 09/01/2016 0852   MONOABS 0.4 09/01/2016 0852   EOSABS 0.1 09/01/2016 0852   BASOSABS 0.0 09/01/2016 0852    No results found for: "POCLITH", "LITHIUM"   No results found for: "PHENYTOIN", "PHENOBARB", "VALPROATE", "CBMZ"   .res Assessment: Plan:    RECOMMENDATIONS:    PDMP reviewed.  Spravato filled 11/05/2023. I provided 50 minutes of face to face time during this encounter, including time spent before and after the visit in records review, medical decision making, counseling pertinent to today's visit, and charting.   Robin Stevenson continues to respond well to the Spravato.  Continue current medications as prescribed.   Return in 1 week.  Diagnoses and all orders for this visit:  Recurrent major depression resistant to treatment [facility])     Please see After Visit Summary for patient specific instructions.  Future Appointments  Date Time Provider Department Center  04/15/2024 10:20 AM [facility]    No orders of the defined types were placed in this encounter.   -------------------------------

## 2023-11-06 NOTE — Progress Notes (Signed)
 NURSES NOTE:   Pt arrived for her # 36 Spravato Treatment for treatment resistant depression, the starting dose was 56 mg (2 of the 28 mg nasal sprays) pt stayed at that dose for the first 4 treatments and also takes Zofran and Meclizine prior to arrival at office. Pt received 84 mg (3 of the 28 mg nasal sprays) total again today and did also premedicate. [facility] had been advised on her treatments and doses needed. Pt sees [name], NP and she will follow her throughout her treatments and follow ups.  Pt's Spravato is ordered through [facility].  Spravato medication is stored at treatment center per REMS/FDA guidelines. The medication is required to be locked behind two doors per REMS/FDA protocol. Medication is also disposed of properly after each use per regulations. All documentation for REMS is completed and submitted per FDA/REMS requirements. Pt's prior authorization was submitted and approved for Spravato 84 mg effective 09/24/2023-09/23/2024. Max daily dose is 0.43         Pt directed to treatment room, started vitals at 1:00 PM, 138/74, pulse 70, SpO2 99%. Pt did take Zofran 4 mg and Meclizine 25 mg prior to arriving at office, an hour ahead. Instructed patient to blow her nose if needed then recline back to a 45 degree angle if that was more comfortable for her. Gave patient first dose 28 mg nasal spray, administered in each nostril as directed and observed by nurse, waited 5 more minutes for the second and third doses. Placed a fan in her room and left her door open in case she did start to experience the extreme feeling of being hot.  After all doses given pt did not complain of nausea. pt keeps water and some mints to help with the taste of Spravato it gives the metallic taste. 30 minutes after she started treatment, she did start vomiting, once she got everything out she felt much better and asked for a ginger ale. She reports starting to get  hot extremely fast and then started her vomiting. Her 40 minute check was at 2:00 PM, once she was feeling better.  Vital signs were 138/90, pulse 65, SpO2 98%. Pt aware she would be monitored for a total time of 120 minutes.  Discharge vitals were taken at 2:53 PM 152/94, P 65, SpO2 98%. Pt met with Rene Kocher to discuss her treatment. Pt advised to relax the rest of the day. No driving, no intense activities. Verbalized understanding. Pt reports no issues today. Nurse was with pt a total of 60 minutes for clinical assessment. Pt will return on Tuesday, March 25th. Pt instructed to call office with any problems or questions prior to next treatment.      LOT [id number] MAY 2027

## 2023-11-07 ENCOUNTER — Other Ambulatory Visit (HOSPITAL_COMMUNITY): Payer: Self-pay

## 2023-11-07 ENCOUNTER — Other Ambulatory Visit: Payer: Self-pay

## 2023-11-07 NOTE — Progress Notes (Signed)
 Specialty Pharmacy Refill Coordination Note  MCKENZI BUONOMO is a 48 y.o. female assessed today regarding refills of clinic administered specialty medication(s) Esketamine HCl (Spravato (84 MG Dose))   Clinic requested Courier to Provider Office   Delivery date: 11/12/23   Verified address: [facility] [address], [place], [place]   Medication will be filled on 11/12/23.   Per [name] patient continues to do well on treatment. Next appointment scheduled for 3/25.

## 2023-11-12 ENCOUNTER — Other Ambulatory Visit (HOSPITAL_COMMUNITY): Payer: Self-pay

## 2023-11-12 ENCOUNTER — Other Ambulatory Visit: Payer: Self-pay

## 2023-11-12 NOTE — Progress Notes (Signed)
 Same Day courier (signature required) on 11/12/23.  Control Number: [id number]

## 2023-11-12 NOTE — Progress Notes (Signed)
 11/12/23 REMS Authorization Code: [id number]

## 2023-11-13 ENCOUNTER — Ambulatory Visit

## 2023-11-13 ENCOUNTER — Encounter: Payer: Self-pay | Admitting: Physician Assistant

## 2023-11-13 ENCOUNTER — Ambulatory Visit (INDEPENDENT_AMBULATORY_CARE_PROVIDER_SITE_OTHER): Admitting: Physician Assistant

## 2023-11-13 VITALS — BP 143/89 | HR 66

## 2023-11-13 DIAGNOSIS — F339 Major depressive disorder, recurrent, unspecified: Secondary | ICD-10-CM | POA: Diagnosis not present

## 2023-11-13 DIAGNOSIS — F411 Generalized anxiety disorder: Secondary | ICD-10-CM

## 2023-11-13 NOTE — Progress Notes (Signed)
 [facility]  Patient ID: Robin Stevenson,  MRN: [id number]  PCP: Robin Funk, MD (Inactive)  Date of Evaluation: 11/13/2023 Time spent:45 minutes  Chief Complaint:  Chief Complaint   Depression; Other    HISTORY/CURRENT STATUS: HPI for Spravato treatment, she sees Robin Rack, NP who is out of the office today.  Robin Stevenson is doing well and is still responding to the Spravato.  She is getting it weekly. Patient is able to enjoy things.  Energy and motivation are good.  She is still under some stress, will be moving in 2 weeks.  Will understandably be glad when it is over with.   No extreme sadness, tearfulness, or feelings of hopelessness.  Sleeps well most of the time. ADLs and personal hygiene are normal.   Denies any changes in concentration, making decisions, or remembering things.  Appetite has not changed.  Weight is stable.  No complaints of anxiety.  No reports of SI/HI.  Review of Systems  Constitutional: Negative.   HENT: Negative.    Eyes: Negative.   Respiratory: Negative.    Cardiovascular: Negative.   Gastrointestinal: Negative.   Genitourinary: Negative.   Musculoskeletal: Negative.   Skin: Negative.   Neurological: Negative.   Endo/Heme/Allergies: Negative.   Psychiatric/Behavioral:         See HPI   Individual Medical History/ Review of Systems: Changes? :No   Past medications for mental health diagnoses include: Spravato, Xanax, Wellbutrin, Vraylar, gabapentin, Fetzima, trazodone  Allergies: Cat dander and Brewers yeast  Current Medications:  Current Outpatient Medications:    ALPRAZolam (XANAX) 0.5 MG tablet, Take 1 tablet at bedtime the night before procedure. Take 1 tablet 1 hour before arrival. Bring third tablet with you to procedure., Disp: 3 tablet, Rfl: 0   atorvastatin (LIPITOR) 20 MG tablet, Take 1 tablet (20 mg total) by mouth daily., Disp: 90 tablet, Rfl: 3   buPROPion (WELLBUTRIN XL) 150 MG 24 hr tablet, , Disp: , Rfl: 3    buPROPion (WELLBUTRIN XL) 150 MG 24 hr tablet, Take 1 tablet (150 mg total) by mouth every morning., Disp: 90 tablet, Rfl: 0   cariprazine (VRAYLAR) 1.5 MG capsule, Take 1 capsule (1.5 mg total) by mouth daily., Disp: 28 capsule, Rfl: 0   celecoxib (CELEBREX) 200 MG capsule, Take 1 capsule by mouth single dose as directed. Take 1 hour prior to arrival for surgery, Disp: 1 capsule, Rfl: 0   Esketamine HCl, 84 MG Dose, (SPRAVATO, 84 MG DOSE,) 28 MG/DEVICE SOPK, Dispense X 3 (28 mg) devices for treatments every 7 days, administer intranasally, Disp: 3 each, Rfl: 3   esomeprazole (NEXIUM) 20 MG capsule, Take 20 mg by mouth once., Disp: , Rfl:    FETZIMA 120 MG CP24, , Disp: , Rfl: 3   gabapentin (NEURONTIN) 300 MG capsule, Take 1 capsule (300 mg total) by mouth 3 (three) times daily. Take first dose 1 hour before arrival for surgery., Disp: 21 capsule, Rfl: 0   meclizine (ANTIVERT) 25 MG tablet, Take 1 tablet (25 mg total) by mouth as directed prior to treatment., Disp: 60 tablet, Rfl: 0   metFORMIN (GLUCOPHAGE) 500 MG tablet, Take 1 tablet (500 mg total) by mouth 2 (two) times daily., Disp: 180 tablet, Rfl: 1   ondansetron (ZOFRAN) 4 MG tablet, Take 1 tablet  by mouth every 6 hours as needed for nausea, Disp: 15 tablet, Rfl: 0   ondansetron (ZOFRAN-ODT) 4 MG disintegrating tablet, Dissolve 1 tablet (4 mg total) by mouth every 8 (eight)  hours as needed for nausea or vomiting., Disp: 20 tablet, Rfl: 1   Semaglutide-Weight Management (WEGOVY) 2.4 MG/0.75ML SOAJ, Inject 2.4 mg into the skin once a week for weight loss., Disp: 3 mL, Rfl: 0   SUMAtriptan (IMITREX) 50 MG tablet, Take 1 tablet by mouth at onset of headache, may repeat same dose once 2 hours later if needed, Disp: 10 tablet, Rfl: 5   traZODone (DESYREL) 50 MG tablet, Take 50 mg by mouth at bedtime., Disp: , Rfl:    atovaquone-proguanil (MALARONE) 250-100 MG TABS tablet, Take 1 tablet by mouth once daily begin 2 days prearrival,take daily during  stay & continue for 7 days posttravel for malaria prevention, Disp: 24 tablet, Rfl: 0   azithromycin (ZITHROMAX) 500 MG tablet, For non bloody diarrhea,take 2 tabs on day 1, if resolved,stop med,If diarrhea persists 1 tab on day 2 & 3. For bloody diarrhea,2 tabs on day 1 & 1 tab on day 2 & 3, Disp: 4 tablet, Rfl: 0   Esketamine HCl, 56 MG Dose, (SPRAVATO, 56 MG DOSE,) 28 MG/DEVICE SOPK, Dispense X 2 (28mg ) devices, administer on day 1 intranasally as directed, Disp: 4 each, Rfl: 0   fluocinonide ointment (LIDEX) 0.05 %, Apply as directed to skin twice a day, Disp: 30 g, Rfl: 0   ketoconazole (NIZORAL) 2 % cream, Apply a small amount to affected area on left foot/toe once a day, Disp: 30 g, Rfl: 0   Levomilnacipran HCl ER (FETZIMA) 120 MG CP24, Take 1 capsule by mouth daily., Disp: 90 capsule, Rfl: 3   metFORMIN (GLUCOPHAGE) 500 MG tablet, Take 1 tablet (500 mg total) by mouth daily., Disp: 90 tablet, Rfl: 1   metFORMIN (GLUCOPHAGE) 500 MG tablet, Take 1 tablet (500 mg total) by mouth 2 (two) times daily., Disp: 90 tablet, Rfl: 1   metFORMIN (GLUCOPHAGE-XR) 500 MG 24 hr tablet, Take 1 tablet (500 mg total) by mouth daily. Please do not fill if not picked up within 30 days., Disp: 30 tablet, Rfl: 0   methocarbamol (ROBAXIN) 500 MG tablet, Take 1 tablet (500 mg total) by mouth every 6 (six) hours as needed for muscle spasms. (Patient not taking: Reported on 10/29/2023), Disp: 45 tablet, Rfl: 0   ondansetron (ZOFRAN) 4 MG tablet, Take 1 tablet (4 mg total) by mouth every 6 (six) hours for nausea. TAKE 4 tablets 15 minutes prior to arrival for surgery, Disp: 30 tablet, Rfl: 0   oxyCODONE (OXY IR/ROXICODONE) 5 MG immediate release tablet, Take 1-2 tablet by mouth every six to eight hours as needed for Post Op Pain. DO NOT EXCEED  6 tablets in 24 hours., Disp: 30 tablet, Rfl: 0   polyethylene glycol (MIRALAX / GLYCOLAX) packet, Take 17 g by mouth daily., Disp: , Rfl:    polyethylene glycol-electrolytes  (NULYTELY) 420 g solution, Take 4,000 mLs by mouth Nightly., Disp: 4000 mL, Rfl: 0   sulfamethoxazole-trimethoprim (BACTRIM DS) 800-160 MG tablet, Take 1 tablet by mouth 2 (two) times daily as directed. Start the day after surgery., Disp: 10 tablet, Rfl: 0   SUMAtriptan (IMITREX) 50 MG tablet, TAKE 1 TABLET BY MOUTH AT ONSET OF HEADACHE, MAY REPEAT 1 DOSE AFTER 2 HOURS IF NEEDED, Disp: 10 tablet, Rfl: 5   SUMAtriptan (IMITREX) 50 MG tablet, Take 1 tablet (50 mg total) by mouth at the onset of headache. May repeat 1 dose after 2 hours if needed., Disp: 10 tablet, Rfl: 5   SUMAtriptan (IMITREX) 50 MG tablet, Take 1 tablet (50 mg  total) by mouth at onset of headache,may repeat 1 dose after 2 hours if needed, 2 to 3 times a week, Disp: 9 tablet, Rfl: 6   terbinafine (LAMISIL) 250 MG tablet, Take 1 tablet (250 mg total) by mouth daily for Tinea, Disp: 14 tablet, Rfl: 0   traZODone (DESYREL) 50 MG tablet, TAKE 1 TO 2 TABLETS BY MOUTH AT BEDTIME, Disp: 180 tablet, Rfl: 1   traZODone (DESYREL) 50 MG tablet, Take 1-2 tablets (50-100 mg total) by mouth at bedtime., Disp: 180 tablet, Rfl: 1   traZODone (DESYREL) 50 MG tablet, Take 1-2 tablets (50-100 mg total) by mouth at bedtime., Disp: 180 tablet, Rfl: 2   WEGOVY 0.5 MG/0.5ML SOAJ, Inject 0.5 mg into the skin once a week for weight loss., Disp: 2 mL, Rfl: 0   WEGOVY 1 MG/0.5ML SOAJ, Inject 1 mg into the skin once a week for weight loss, Disp: 2 mL, Rfl: 0   WEGOVY 2.4 MG/0.75ML SOAJ, Inject 2.4 mg into the skin once a week for weight loss., Disp: 3 mL, Rfl: 0   WEGOVY 2.4 MG/0.75ML SOAJ, Inject 2.4 mg into the skin once a week fro weight loss, Disp: 3 mL, Rfl: 0   WEGOVY 2.4 MG/0.75ML SOAJ, Inject 2.4 mg into the skin once a week for weight loss, Disp: 3 mL, Rfl: 0   WEGOVY 2.4 MG/0.75ML SOAJ, Inject 2.4 mg,sub-q, into the skin once a week for weight loss, Disp: 3 mL, Rfl: 0   WEGOVY 2.4 MG/0.75ML SOAJ, Inject 2.4 mg into the skin once a week for weight loss.,  Disp: 3 mL, Rfl: 0   WEGOVY 2.4 MG/0.75ML SOAJ, Inject 2.4 mg into the skin once a week for weight loss., Disp: 3 mL, Rfl: 0   WEGOVY 2.4 MG/0.75ML SOAJ, Inject 2.4 mg into the skin once a week for weight loss, Disp: 3 mL, Rfl: 0 Medication Side Effects: none  Family Medical/ Social History: Changes? No  MENTAL HEALTH EXAM:  There were no vitals taken for this visit.There is no height or weight on file to calculate BMI.  General Appearance: Casual and Well Groomed  Eye Contact:  Good  Speech:  Clear and Coherent and Normal Rate  Volume:  Normal  Mood:  Euthymic  Affect:  Congruent  Thought Process:  Goal Directed and Descriptions of Associations: Circumstantial  Orientation:  Full (Time, Place, and Person)  Thought Content: Logical   Suicidal Thoughts:  No  Homicidal Thoughts:  No  Memory:  WNL  Judgement:  Good  Insight:  Good  Psychomotor Activity:  Normal  Concentration:  Concentration: Good and Attention Span: Good  Recall:  Good  Fund of Knowledge: Good  Language: Good  Assets:  Communication Skills Desire for Improvement Financial Resources/Insurance Housing Transportation Vocational/Educational  ADL's:  Intact  Cognition: WNL  Prognosis:  Good   Patient was administered Spravato 84 mg intranasally today.  The patient experienced the typical dissociation which gradually resolved over the 2-hour period of observation.  There were no complications.  Specifically the patient did not have nausea or vomiting or headache.  Blood pressures were slightly elevated at 40 minutes and the 2-hour mark after Spravato was given, which is common for her.  She is monitoring blood pressures at home and they are running normal.  Pulse ox all were within normal limits.  See nurse's note for further details.  By the time the 2-hour observation period was met the patient was alert and oriented and able to exit without assistance.  Patient feels the Spravato administration is helpful for the  treatment resistant depression and would like to continue the treatment.     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 Generalized anxiety disorder  F41.1      Receiving Psychotherapy: No   RECOMMENDATIONS:   PDMP reviewed.  Spravato filled 11/12/2023. I provided  45 minutes of face to face time during this encounter, including time spent before and after the visit in records review, medical decision making, counseling pertinent to today's visit, and charting.   She is still responding well to the Spravato so no changes will be made.  Continue all current meds as per med list. Return in 1 week.  Melony Overly, PA-C

## 2023-11-13 NOTE — Progress Notes (Signed)
 NURSES NOTE:   Pt arrived for her # 70 Spravato Treatment for treatment resistant depression, the starting dose was 56 mg (2 of the 28 mg nasal sprays) pt stayed at that dose for the first 4 treatments and also takes Zofran and Meclizine prior to arrival at office. Pt received 84 mg (3 of the 28 mg nasal sprays) total again today and did also premedicate. [facility] had been advised on her treatments and doses needed. Pt sees [name], NP and she will follow her throughout her treatments and follow ups.  Pt's Spravato is ordered through [facility].  Spravato medication is stored at treatment center per REMS/FDA guidelines. The medication is required to be locked behind two doors per REMS/FDA protocol. Medication is also disposed of properly after each use per regulations. All documentation for REMS is completed and submitted per FDA/REMS requirements. Pt's prior authorization was submitted and approved for Spravato 84 mg effective 09/24/2023-09/23/2024. Max daily dose is 0.43         Pt directed to treatment room, started vitals at 10:05 AM, 134/80, pulse 77, SpO2 98%. Pt did take Zofran 4 mg and Meclizine 25 mg prior to arriving at office, an hour ahead. Instructed patient to blow her nose if needed then recline back to a 45 degree angle if that was more comfortable for her. Gave patient first dose 28 mg nasal spray, administered in each nostril as directed and observed by nurse, waited 5 more minutes for the second and third doses. Placed a fan in her room. After all doses given pt did not complain of nausea. pt keeps water and some mints to help with the taste of Spravato it gives the metallic taste. Her 40 minute check was at 10:45 AM, vital signs were 140/90, pulse 73, SpO2 98%. Pt aware she would be monitored for a total time of 120 minutes.  Discharge vitals were taken at 11:55 AM 143/89, P 66, SpO2 99%. Pt met with Melony Overly, PA-C to discuss her  treatment. Pt advised to relax the rest of the day. No driving, no intense activities. Verbalized understanding. Pt reports no issues today. Nurse was with pt a total of 60 minutes for clinical assessment. Pt will return on Monday, March 31st. Pt instructed to call office with any problems or questions prior to next treatment.      LOT [id number] OCT 2027

## 2023-11-14 ENCOUNTER — Other Ambulatory Visit: Payer: Self-pay

## 2023-11-14 ENCOUNTER — Other Ambulatory Visit (HOSPITAL_COMMUNITY): Payer: Self-pay

## 2023-11-14 MED ORDER — SUMATRIPTAN SUCCINATE 50 MG PO TABS
50.0000 mg | ORAL_TABLET | ORAL | 6 refills | Status: AC | PRN
  Filled 2023-11-14: qty 9, 30d supply, fill #0
  Filled 2024-01-04: qty 9, 30d supply, fill #1
  Filled 2024-02-20: qty 9, 30d supply, fill #2
  Filled 2024-04-22: qty 9, 30d supply, fill #3
  Filled 2024-05-24: qty 9, 30d supply, fill #4
  Filled 2024-06-29: qty 9, 30d supply, fill #5
  Filled 2024-08-26: qty 9, 30d supply, fill #6

## 2023-11-14 NOTE — Progress Notes (Signed)
 Specialty Pharmacy Refill Coordination Note  Robin Stevenson is a 48 y.o. female assessed today regarding refills of clinic administered specialty medication(s) Esketamine HCl (Spravato (84 MG Dose))   Clinic requested Courier to Provider Office   Delivery date: 11/19/23   Verified address: [facility] [address], [address], [place], [id number]   Medication will be filled on 11/19/23.   Per [name] patient continues to do well on treatment once weekly. Patient appt is currently 3/31 in AM but soonest we can bill is 3/29. Advised [name] we cannot deliver sooner than 3/31 and she will speak to patient and reschedule.

## 2023-11-19 ENCOUNTER — Ambulatory Visit

## 2023-11-19 ENCOUNTER — Ambulatory Visit (INDEPENDENT_AMBULATORY_CARE_PROVIDER_SITE_OTHER): Admitting: Physician Assistant

## 2023-11-19 ENCOUNTER — Other Ambulatory Visit: Payer: Self-pay

## 2023-11-19 ENCOUNTER — Encounter: Payer: Self-pay | Admitting: Physician Assistant

## 2023-11-19 VITALS — BP 145/99 | HR 70

## 2023-11-19 DIAGNOSIS — F339 Major depressive disorder, recurrent, unspecified: Secondary | ICD-10-CM

## 2023-11-19 DIAGNOSIS — F411 Generalized anxiety disorder: Secondary | ICD-10-CM | POA: Diagnosis not present

## 2023-11-19 DIAGNOSIS — F418 Other specified anxiety disorders: Secondary | ICD-10-CM | POA: Diagnosis not present

## 2023-11-19 NOTE — Progress Notes (Signed)
 [facility]  Patient ID: Robin Stevenson,  MRN: [id number]  PCP: [name], MD (Inactive)  Date of Evaluation: 11/19/2023 Time spent:55 minutes  Chief Complaint:  Chief Complaint   Depression; Other    HISTORY/CURRENT STATUS: HPI for Spravato treatment, she sees [name], NP who is out of the office today.  [name] continues to respond well to the spravato.  She is still under quite a bit of stress because of the upcoming move.other than that the anxiety is controlled. Patient is able to enjoy things.  Energy and motivation are good.  No extreme sadness, tearfulness, or feelings of hopelessness.  Sleeps well most of the time.  ADLs and personal hygiene are normal.   Denies any changes in concentration, making decisions, or remembering things.  Appetite has not changed.  Weight is stable.  No suicidal or homicidal thoughts.  Review of Systems  Constitutional: Negative.   HENT: Negative.    Eyes: Negative.   Respiratory: Negative.    Cardiovascular: Negative.   Gastrointestinal: Negative.   Genitourinary: Negative.   Musculoskeletal: Negative.   Skin: Negative.   Neurological: Negative.   Endo/Heme/Allergies: Negative.   Psychiatric/Behavioral:         See HPI   Individual Medical History/ Review of Systems: Changes? :No   Past medications for mental health diagnoses include: Spravato, Xanax, Wellbutrin, Vraylar, gabapentin, Fetzima, trazodone  Allergies: Cat dander and Brewers yeast  Current Medications:  Current Outpatient Medications:    ALPRAZolam (XANAX) 0.5 MG tablet, Take 1 tablet at bedtime the night before procedure. Take 1 tablet 1 hour before arrival. Bring third tablet with you to procedure., Disp: 3 tablet, Rfl: 0   atorvastatin (LIPITOR) 20 MG tablet, Take 1 tablet (20 mg total) by mouth daily., Disp: 90 tablet, Rfl: 3   azithromycin (ZITHROMAX) 500 MG tablet, For non bloody diarrhea,take 2 tabs on day 1, if resolved,stop med,If  diarrhea persists 1 tab on day 2 & 3. For bloody diarrhea,2 tabs on day 1 & 1 tab on day 2 & 3, Disp: 4 tablet, Rfl: 0   buPROPion (WELLBUTRIN XL) 150 MG 24 hr tablet, , Disp: , Rfl: 3   buPROPion (WELLBUTRIN XL) 150 MG 24 hr tablet, Take 1 tablet (150 mg total) by mouth every morning., Disp: 90 tablet, Rfl: 0   cariprazine (VRAYLAR) 1.5 MG capsule, Take 1 capsule (1.5 mg total) by mouth daily., Disp: 28 capsule, Rfl: 0   celecoxib (CELEBREX) 200 MG capsule, Take 1 capsule by mouth single dose as directed. Take 1 hour prior to arrival for surgery, Disp: 1 capsule, Rfl: 0   Esketamine HCl, 84 MG Dose, (SPRAVATO, 84 MG DOSE,) 28 MG/DEVICE SOPK, Dispense X 3 (28 mg) devices for treatments every 7 days, administer intranasally, Disp: 3 each, Rfl: 3   esomeprazole (NEXIUM) 20 MG capsule, Take 20 mg by mouth once., Disp: , Rfl:    FETZIMA 120 MG CP24, , Disp: , Rfl: 3   gabapentin (NEURONTIN) 300 MG capsule, Take 1 capsule (300 mg total) by mouth 3 (three) times daily. Take first dose 1 hour before arrival for surgery., Disp: 21 capsule, Rfl: 0   Levomilnacipran HCl ER (FETZIMA) 120 MG CP24, Take 1 capsule by mouth daily., Disp: 90 capsule, Rfl: 3   meclizine (ANTIVERT) 25 MG tablet, Take 1 tablet (25 mg total) by mouth as directed prior to treatment., Disp: 60 tablet, Rfl: 0   metFORMIN (GLUCOPHAGE) 500 MG tablet, Take 1 tablet (500 mg total) by mouth  daily., Disp: 90 tablet, Rfl: 1   metFORMIN (GLUCOPHAGE) 500 MG tablet, Take 1 tablet (500 mg total) by mouth 2 (two) times daily., Disp: 180 tablet, Rfl: 1   metFORMIN (GLUCOPHAGE) 500 MG tablet, Take 1 tablet (500 mg total) by mouth 2 (two) times daily., Disp: 90 tablet, Rfl: 1   metFORMIN (GLUCOPHAGE-XR) 500 MG 24 hr tablet, Take 1 tablet (500 mg total) by mouth daily. Please do not fill if not picked up within 30 days., Disp: 30 tablet, Rfl: 0   ondansetron (ZOFRAN) 4 MG tablet, Take 1 tablet  by mouth every 6 hours as needed for nausea, Disp: 15 tablet,  Rfl: 0   Semaglutide-Weight Management (WEGOVY) 2.4 MG/0.75ML SOAJ, Inject 2.4 mg into the skin once a week for weight loss., Disp: 3 mL, Rfl: 0   SUMAtriptan (IMITREX) 50 MG tablet, Take 1 tablet by mouth at onset of headache, may repeat same dose once 2 hours later if needed, Disp: 10 tablet, Rfl: 5   traZODone (DESYREL) 50 MG tablet, Take 1-2 tablets (50-100 mg total) by mouth at bedtime., Disp: 180 tablet, Rfl: 1   atovaquone-proguanil (MALARONE) 250-100 MG TABS tablet, Take 1 tablet by mouth once daily begin 2 days prearrival,take daily during stay & continue for 7 days posttravel for malaria prevention, Disp: 24 tablet, Rfl: 0   Esketamine HCl, 56 MG Dose, (SPRAVATO, 56 MG DOSE,) 28 MG/DEVICE SOPK, Dispense X 2 (28mg ) devices, administer on day 1 intranasally as directed, Disp: 4 each, Rfl: 0   fluocinonide ointment (LIDEX) 0.05 %, Apply as directed to skin twice a day, Disp: 30 g, Rfl: 0   ketoconazole (NIZORAL) 2 % cream, Apply a small amount to affected area on left foot/toe once a day, Disp: 30 g, Rfl: 0   methocarbamol (ROBAXIN) 500 MG tablet, Take 1 tablet (500 mg total) by mouth every 6 (six) hours as needed for muscle spasms. (Patient not taking: Reported on 10/29/2023), Disp: 45 tablet, Rfl: 0   ondansetron (ZOFRAN) 4 MG tablet, Take 1 tablet (4 mg total) by mouth every 6 (six) hours for nausea. TAKE 4 tablets 15 minutes prior to arrival for surgery, Disp: 30 tablet, Rfl: 0   ondansetron (ZOFRAN-ODT) 4 MG disintegrating tablet, Dissolve 1 tablet (4 mg total) by mouth every 8 (eight) hours as needed for nausea or vomiting., Disp: 20 tablet, Rfl: 1   oxyCODONE (OXY IR/ROXICODONE) 5 MG immediate release tablet, Take 1-2 tablet by mouth every six to eight hours as needed for Post Op Pain. DO NOT EXCEED  6 tablets in 24 hours., Disp: 30 tablet, Rfl: 0   polyethylene glycol (MIRALAX / GLYCOLAX) packet, Take 17 g by mouth daily., Disp: , Rfl:    polyethylene glycol-electrolytes (NULYTELY) 420 g  solution, Take 4,000 mLs by mouth Nightly., Disp: 4000 mL, Rfl: 0   sulfamethoxazole-trimethoprim (BACTRIM DS) 800-160 MG tablet, Take 1 tablet by mouth 2 (two) times daily as directed. Start the day after surgery., Disp: 10 tablet, Rfl: 0   SUMAtriptan (IMITREX) 50 MG tablet, TAKE 1 TABLET BY MOUTH AT ONSET OF HEADACHE, MAY REPEAT 1 DOSE AFTER 2 HOURS IF NEEDED, Disp: 10 tablet, Rfl: 5   SUMAtriptan (IMITREX) 50 MG tablet, Take 1 tablet (50 mg total) by mouth at the onset of headache. May repeat 1 dose after 2 hours if needed., Disp: 10 tablet, Rfl: 5   SUMAtriptan (IMITREX) 50 MG tablet, Take 1 tablet (50 mg total) by mouth at onset of headache,may repeat 1 dose after  2 hours if needed, 2 to 3 times a week, Disp: 9 tablet, Rfl: 6   SUMAtriptan (IMITREX) 50 MG tablet, Take 1 tablet (50 mg total) by mouth as needed at onset of headache. May repeat 1 dose after 2 hours if needed. Only take 2-3 times a week., Disp: 9 tablet, Rfl: 6   terbinafine (LAMISIL) 250 MG tablet, Take 1 tablet (250 mg total) by mouth daily for Tinea, Disp: 14 tablet, Rfl: 0   traZODone (DESYREL) 50 MG tablet, Take 50 mg by mouth at bedtime., Disp: , Rfl:    traZODone (DESYREL) 50 MG tablet, TAKE 1 TO 2 TABLETS BY MOUTH AT BEDTIME, Disp: 180 tablet, Rfl: 1   traZODone (DESYREL) 50 MG tablet, Take 1-2 tablets (50-100 mg total) by mouth at bedtime., Disp: 180 tablet, Rfl: 2   WEGOVY 0.5 MG/0.5ML SOAJ, Inject 0.5 mg into the skin once a week for weight loss., Disp: 2 mL, Rfl: 0   WEGOVY 1 MG/0.5ML SOAJ, Inject 1 mg into the skin once a week for weight loss, Disp: 2 mL, Rfl: 0   WEGOVY 2.4 MG/0.75ML SOAJ, Inject 2.4 mg into the skin once a week for weight loss., Disp: 3 mL, Rfl: 0   WEGOVY 2.4 MG/0.75ML SOAJ, Inject 2.4 mg into the skin once a week fro weight loss, Disp: 3 mL, Rfl: 0   WEGOVY 2.4 MG/0.75ML SOAJ, Inject 2.4 mg into the skin once a week for weight loss, Disp: 3 mL, Rfl: 0   WEGOVY 2.4 MG/0.75ML SOAJ, Inject 2.4  mg,sub-q, into the skin once a week for weight loss, Disp: 3 mL, Rfl: 0   WEGOVY 2.4 MG/0.75ML SOAJ, Inject 2.4 mg into the skin once a week for weight loss., Disp: 3 mL, Rfl: 0   WEGOVY 2.4 MG/0.75ML SOAJ, Inject 2.4 mg into the skin once a week for weight loss., Disp: 3 mL, Rfl: 0   WEGOVY 2.4 MG/0.75ML SOAJ, Inject 2.4 mg into the skin once a week for weight loss, Disp: 3 mL, Rfl: 0 Medication Side Effects: none  Family Medical/ Social History: Changes? No  MENTAL HEALTH EXAM:  There were no vitals taken for this visit.There is no height or weight on file to calculate BMI.  General Appearance: Casual and Well Groomed  Eye Contact:  Good  Speech:  Clear and Coherent and Normal Rate  Volume:  Normal  Mood:  Euthymic  Affect:  Congruent  Thought Process:  Goal Directed and Descriptions of Associations: Circumstantial  Orientation:  Full (Time, Place, and Person)  Thought Content: Logical   Suicidal Thoughts:  No  Homicidal Thoughts:  No  Memory:  WNL  Judgement:  Good  Insight:  Good  Psychomotor Activity:  Normal  Concentration:  Concentration: Good and Attention Span: Good  Recall:  Good  Fund of Knowledge: Good  Language: Good  Assets:  Communication Skills Desire for Improvement Financial Resources/Insurance Housing Resilience Transportation Vocational/Educational  ADL's:  Intact  Cognition: WNL  Prognosis:  Good   Patient was administered Spravato 84 mg intranasally today.  The patient experienced the typical dissociation which gradually resolved over the 2-hour period of observation.  There were no complications.  Specifically the patient did not have nausea or vomiting or headache.  Blood pressures were slightly elevated at 40 minutes and the 2-hour mark after Spravato was given, which is common for her.  She is monitoring blood pressures at home and they are running normal.  Pulse ox all were within normal limits.  See  nurse's note for further details.  By the time  the 2-hour observation period was met the patient was alert and oriented and able to exit without assistance.  Patient feels the Spravato administration is helpful for the treatment resistant depression and would like to continue the treatment.     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 Generalized anxiety disorder  F41.1     3. Situational anxiety  F41.8       Receiving Psychotherapy: No   RECOMMENDATIONS:   PDMP reviewed.  Spravato filled 11/12/2023. I provided  55 minutes of face to face time during this encounter, including time spent before and after the visit in records review, medical decision making, counseling pertinent to today's visit, and charting.   She is still responding well to the Spravato so no changes will be made.  Continue all current meds as per med list. Return in 1 week.  Melony Overly, PA-C

## 2023-11-19 NOTE — Progress Notes (Signed)
 NURSES NOTE:   Pt arrived for her # 40 Spravato Treatment for treatment resistant depression, the starting dose was 56 mg (2 of the 28 mg nasal sprays) pt stayed at that dose for the first 4 treatments and also takes Zofran and Meclizine prior to arrival at office. Pt received 84 mg (3 of the 28 mg nasal sprays) total again today and did also premedicate. [facility] had been advised on her treatments and doses needed. Pt sees [name], NP and she will follow her throughout her treatments and follow ups.  Pt's Spravato is ordered through [facility].  Spravato medication is stored at treatment center per REMS/FDA guidelines. The medication is required to be locked behind two doors per REMS/FDA protocol. Medication is also disposed of properly after each use per regulations. All documentation for REMS is completed and submitted per FDA/REMS requirements. Pt's prior authorization was submitted and approved for Spravato 84 mg effective 09/24/2023-09/23/2024. Max daily dose is 0.43         Pt directed to treatment room, started vitals at 10:15 AM, 133/86, pulse 75, SpO2 99%. Pt did take Zofran 4 mg and Meclizine 25 mg prior to arriving at office, an hour ahead. Instructed patient to blow her nose if needed then recline back to a 45 degree angle if that was more comfortable for her. Gave patient first dose 28 mg nasal spray, administered in each nostril as directed and observed by nurse, waited 5 more minutes for the second and third doses. Placed a fan in her room. After all doses given pt did not complain of nausea. pt keeps water and some mints to help with the taste of Spravato it gives the metallic taste. Her 40 minute check was at 11:05 AM, vital signs were 156/94, pulse 72, SpO2 98%. Pt aware she would be monitored for a total time of 120 minutes.  Discharge vitals were taken at 12:17 PM 145/99, P 70, SpO2 99%. Pt met with Melony Overly, PA-C to discuss her  treatment. Pt advised to relax the rest of the day. No driving, no intense activities. Verbalized understanding. Pt reports no issues today. Nurse was with pt a total of 60 minutes for clinical assessment. Pt will return on Wednesday, April 9th. Pt instructed to call office with any problems or questions prior to next treatment.      LOT [id number] OCT 2027

## 2023-11-19 NOTE — Progress Notes (Signed)
 11/19/23 REMS Authorization Code: [id number]

## 2023-11-22 ENCOUNTER — Other Ambulatory Visit (HOSPITAL_COMMUNITY): Payer: Self-pay

## 2023-11-22 NOTE — Progress Notes (Signed)
 Specialty Pharmacy Refill Coordination Note  Robin Stevenson is a 48 y.o. female contacted today regarding refills of specialty medication(s) Esketamine HCl (Spravato (84 MG Dose))   Patient requested Courier to Provider Office   Delivery date: 11/26/23   Verified address: [facility] [address], [address], [place], [place]   Medication will be filled on 11/26/23. Same day courier, signature required.    Per [name], patient continues to do well on therapy with no issues or concerns.    Spravato REMS authorization will be added on the date of dispense.

## 2023-11-26 ENCOUNTER — Other Ambulatory Visit: Payer: Self-pay

## 2023-11-26 NOTE — Progress Notes (Signed)
 11/26/23 REMS Authorization Code: [id number]

## 2023-11-28 ENCOUNTER — Ambulatory Visit: Admitting: Physician Assistant

## 2023-11-28 ENCOUNTER — Encounter: Payer: Self-pay | Admitting: Physician Assistant

## 2023-11-28 ENCOUNTER — Other Ambulatory Visit (HOSPITAL_COMMUNITY): Payer: Self-pay

## 2023-11-28 ENCOUNTER — Other Ambulatory Visit: Payer: Self-pay | Admitting: Adult Health

## 2023-11-28 ENCOUNTER — Ambulatory Visit

## 2023-11-28 VITALS — BP 135/84 | HR 67

## 2023-11-28 DIAGNOSIS — F411 Generalized anxiety disorder: Secondary | ICD-10-CM | POA: Diagnosis not present

## 2023-11-28 DIAGNOSIS — F339 Major depressive disorder, recurrent, unspecified: Secondary | ICD-10-CM

## 2023-11-28 DIAGNOSIS — F418 Other specified anxiety disorders: Secondary | ICD-10-CM

## 2023-11-28 DIAGNOSIS — F331 Major depressive disorder, recurrent, moderate: Secondary | ICD-10-CM

## 2023-11-28 NOTE — Progress Notes (Signed)
 NURSES NOTE:   Pt arrived for her # 54 Spravato Treatment for treatment resistant depression, the starting dose was 56 mg (2 of the 28 mg nasal sprays) pt stayed at that dose for the first 4 treatments and also takes Zofran and Meclizine prior to arrival at office. Pt received 84 mg (3 of the 28 mg nasal sprays) total again today and did also premedicate. [facility] had been advised on her treatments and doses needed. Pt sees [name], NP and she will follow her throughout her treatments and follow ups.  Pt's Spravato is ordered through [facility].  Spravato medication is stored at treatment center per REMS/FDA guidelines. The medication is required to be locked behind two doors per REMS/FDA protocol. Medication is also disposed of properly after each use per regulations. All documentation for REMS is completed and submitted per FDA/REMS requirements. Pt's prior authorization was submitted and approved for Spravato 84 mg effective 09/24/2023-09/23/2024. Max daily dose is 0.43         Pt directed to treatment room, started vitals at 10:08 AM, 127/79, pulse 75, SpO2 98%. Pt did take Zofran 4 mg and Meclizine 25 mg prior to arriving at office, an hour ahead. Instructed patient to blow her nose if needed then recline back to a 45 degree angle if that was more comfortable for her. Gave patient first dose 28 mg nasal spray, administered in each nostril as directed and observed by nurse, waited 5 more minutes for the second and third doses. Placed a fan in her room. After all doses given pt did not complain of nausea. pt keeps water and some mints to help with the taste of Spravato it gives the metallic taste. Her 40 minute check was at 10:55 AM, vital signs were 131/87, pulse 64, SpO2 98%. Pt aware she would be monitored for a total time of 120 minutes.  Discharge vitals were taken at 12:17 PM 145/99, P 70, SpO2 99%. Pt met with Melony Overly, PA-C to discuss her  treatment. Pt advised to relax the rest of the day. No driving, no intense activities. Verbalized understanding. Pt reports no issues today. Nurse was with pt a total of 60 minutes for clinical assessment. Pt will return on, Tuesday, April 15th. Pt instructed to call office with any problems or questions prior to next treatment.      LOT [id number] OCT 2027

## 2023-11-28 NOTE — Progress Notes (Signed)
 [facility]  Patient ID: Robin Stevenson,  MRN: [id number]  PCP: [name], MD (Inactive)  Date of Evaluation: 11/28/2023 Time spent:50 minutes  Chief Complaint:  Chief Complaint   Depression; Other    HISTORY/CURRENT STATUS: HPI for Spravato treatment, she sees [name], NP who is out of the office today.  She's doing well, now that she and husband have moved into their new home. Less stressed. Feels like the current treatment is working well. Patient is able to enjoy things.  Energy and motivation are good.  No extreme sadness, tearfulness, or feelings of hopelessness.  Sleeps ok. ADLs and personal hygiene are normal.   Denies any changes in concentration, making decisions, or remembering things.  Appetite has not changed.  Weight is stable.  Anxiety is well controlled.  Takes Xanax prn.  Denies suicidal or homicidal thoughts.  Patient denies increased energy with decreased need for sleep, increased talkativeness, racing thoughts, impulsivity or risky behaviors, increased spending, increased libido, grandiosity, increased irritability or anger, paranoia, or hallucinations.  Denies dizziness, syncope, seizures, numbness, tingling, tremor, tics, unsteady gait, slurred speech, confusion. Denies muscle or joint pain, stiffness, or dystonia. Denies unexplained weight loss, frequent infections, or sores that heal slowly.  No polyphagia, polydipsia, or polyuria. Denies visual changes or paresthesias.   Individual Medical History/ Review of Systems: Changes? :No   Past medications for mental health diagnoses include: Spravato, Xanax, Wellbutrin, Vraylar, gabapentin, Fetzima, trazodone  Allergies: Cat dander and Brewers yeast  Current Medications:  Current Outpatient Medications:    ALPRAZolam (XANAX) 0.5 MG tablet, Take 1 tablet at bedtime the night before procedure. Take 1 tablet 1 hour before arrival. Bring third tablet with you to procedure., Disp: 3 tablet, Rfl:  0   atorvastatin (LIPITOR) 20 MG tablet, Take 1 tablet (20 mg total) by mouth daily., Disp: 90 tablet, Rfl: 3   atovaquone-proguanil (MALARONE) 250-100 MG TABS tablet, Take 1 tablet by mouth once daily begin 2 days prearrival,take daily during stay & continue for 7 days posttravel for malaria prevention, Disp: 24 tablet, Rfl: 0   azithromycin (ZITHROMAX) 500 MG tablet, For non bloody diarrhea,take 2 tabs on day 1, if resolved,stop med,If diarrhea persists 1 tab on day 2 & 3. For bloody diarrhea,2 tabs on day 1 & 1 tab on day 2 & 3, Disp: 4 tablet, Rfl: 0   buPROPion (WELLBUTRIN XL) 150 MG 24 hr tablet, , Disp: , Rfl: 3   buPROPion (WELLBUTRIN XL) 150 MG 24 hr tablet, Take 1 tablet (150 mg total) by mouth every morning., Disp: 90 tablet, Rfl: 0   cariprazine (VRAYLAR) 1.5 MG capsule, Take 1 capsule (1.5 mg total) by mouth daily., Disp: 28 capsule, Rfl: 0   celecoxib (CELEBREX) 200 MG capsule, Take 1 capsule by mouth single dose as directed. Take 1 hour prior to arrival for surgery, Disp: 1 capsule, Rfl: 0   Esketamine HCl, 84 MG Dose, (SPRAVATO, 84 MG DOSE,) 28 MG/DEVICE SOPK, Dispense X 3 (28 mg) devices for treatments every 7 days, administer intranasally, Disp: 3 each, Rfl: 3   esomeprazole (NEXIUM) 20 MG capsule, Take 20 mg by mouth once., Disp: , Rfl:    gabapentin (NEURONTIN) 300 MG capsule, Take 1 capsule (300 mg total) by mouth 3 (three) times daily. Take first dose 1 hour before arrival for surgery., Disp: 21 capsule, Rfl: 0   Levomilnacipran HCl ER (FETZIMA) 120 MG CP24, Take 1 capsule by mouth daily., Disp: 90 capsule, Rfl: 3   meclizine (ANTIVERT)  25 MG tablet, Take 1 tablet (25 mg total) by mouth as directed prior to treatment., Disp: 60 tablet, Rfl: 0   metFORMIN (GLUCOPHAGE) 500 MG tablet, Take 1 tablet (500 mg total) by mouth daily., Disp: 90 tablet, Rfl: 1   SUMAtriptan (IMITREX) 50 MG tablet, Take 1 tablet (50 mg total) by mouth at the onset of headache. May repeat 1 dose after 2 hours  if needed., Disp: 10 tablet, Rfl: 5   traZODone (DESYREL) 50 MG tablet, Take 50 mg by mouth at bedtime., Disp: , Rfl:    Esketamine HCl, 56 MG Dose, (SPRAVATO, 56 MG DOSE,) 28 MG/DEVICE SOPK, Dispense X 2 (28mg ) devices, administer on day 1 intranasally as directed, Disp: 4 each, Rfl: 0   FETZIMA 120 MG CP24, , Disp: , Rfl: 3   fluocinonide ointment (LIDEX) 0.05 %, Apply as directed to skin twice a day, Disp: 30 g, Rfl: 0   ketoconazole (NIZORAL) 2 % cream, Apply a small amount to affected area on left foot/toe once a day, Disp: 30 g, Rfl: 0   metFORMIN (GLUCOPHAGE) 500 MG tablet, Take 1 tablet (500 mg total) by mouth 2 (two) times daily., Disp: 180 tablet, Rfl: 1   metFORMIN (GLUCOPHAGE) 500 MG tablet, Take 1 tablet (500 mg total) by mouth 2 (two) times daily., Disp: 90 tablet, Rfl: 1   metFORMIN (GLUCOPHAGE-XR) 500 MG 24 hr tablet, Take 1 tablet (500 mg total) by mouth daily. Please do not fill if not picked up within 30 days., Disp: 30 tablet, Rfl: 0   methocarbamol (ROBAXIN) 500 MG tablet, Take 1 tablet (500 mg total) by mouth every 6 (six) hours as needed for muscle spasms. (Patient not taking: Reported on 10/29/2023), Disp: 45 tablet, Rfl: 0   ondansetron (ZOFRAN) 4 MG tablet, Take 1 tablet  by mouth every 6 hours as needed for nausea (Patient not taking: Reported on 11/28/2023), Disp: 15 tablet, Rfl: 0   ondansetron (ZOFRAN) 4 MG tablet, Take 1 tablet (4 mg total) by mouth every 6 (six) hours for nausea. TAKE 4 tablets 15 minutes prior to arrival for surgery, Disp: 30 tablet, Rfl: 0   ondansetron (ZOFRAN-ODT) 4 MG disintegrating tablet, Dissolve 1 tablet (4 mg total) by mouth every 8 (eight) hours as needed for nausea or vomiting., Disp: 20 tablet, Rfl: 1   oxyCODONE (OXY IR/ROXICODONE) 5 MG immediate release tablet, Take 1-2 tablet by mouth every six to eight hours as needed for Post Op Pain. DO NOT EXCEED  6 tablets in 24 hours., Disp: 30 tablet, Rfl: 0   polyethylene glycol (MIRALAX / GLYCOLAX)  packet, Take 17 g by mouth daily., Disp: , Rfl:    polyethylene glycol-electrolytes (NULYTELY) 420 g solution, Take 4,000 mLs by mouth Nightly., Disp: 4000 mL, Rfl: 0   Semaglutide-Weight Management (WEGOVY) 2.4 MG/0.75ML SOAJ, Inject 2.4 mg into the skin once a week for weight loss., Disp: 3 mL, Rfl: 0   sulfamethoxazole-trimethoprim (BACTRIM DS) 800-160 MG tablet, Take 1 tablet by mouth 2 (two) times daily as directed. Start the day after surgery., Disp: 10 tablet, Rfl: 0   SUMAtriptan (IMITREX) 50 MG tablet, TAKE 1 TABLET BY MOUTH AT ONSET OF HEADACHE, MAY REPEAT 1 DOSE AFTER 2 HOURS IF NEEDED, Disp: 10 tablet, Rfl: 5   SUMAtriptan (IMITREX) 50 MG tablet, Take 1 tablet by mouth at onset of headache, may repeat same dose once 2 hours later if needed, Disp: 10 tablet, Rfl: 5   SUMAtriptan (IMITREX) 50 MG tablet, Take 1 tablet (  50 mg total) by mouth at onset of headache,may repeat 1 dose after 2 hours if needed, 2 to 3 times a week, Disp: 9 tablet, Rfl: 6   SUMAtriptan (IMITREX) 50 MG tablet, Take 1 tablet (50 mg total) by mouth as needed at onset of headache. May repeat 1 dose after 2 hours if needed. Only take 2-3 times a week., Disp: 9 tablet, Rfl: 6   terbinafine (LAMISIL) 250 MG tablet, Take 1 tablet (250 mg total) by mouth daily for Tinea, Disp: 14 tablet, Rfl: 0   traZODone (DESYREL) 50 MG tablet, TAKE 1 TO 2 TABLETS BY MOUTH AT BEDTIME, Disp: 180 tablet, Rfl: 1   traZODone (DESYREL) 50 MG tablet, Take 1-2 tablets (50-100 mg total) by mouth at bedtime., Disp: 180 tablet, Rfl: 1   traZODone (DESYREL) 50 MG tablet, Take 1-2 tablets (50-100 mg total) by mouth at bedtime., Disp: 180 tablet, Rfl: 2   WEGOVY 0.5 MG/0.5ML SOAJ, Inject 0.5 mg into the skin once a week for weight loss., Disp: 2 mL, Rfl: 0   WEGOVY 1 MG/0.5ML SOAJ, Inject 1 mg into the skin once a week for weight loss, Disp: 2 mL, Rfl: 0   WEGOVY 2.4 MG/0.75ML SOAJ, Inject 2.4 mg into the skin once a week for weight loss., Disp: 3 mL,  Rfl: 0   WEGOVY 2.4 MG/0.75ML SOAJ, Inject 2.4 mg into the skin once a week fro weight loss, Disp: 3 mL, Rfl: 0   WEGOVY 2.4 MG/0.75ML SOAJ, Inject 2.4 mg into the skin once a week for weight loss, Disp: 3 mL, Rfl: 0   WEGOVY 2.4 MG/0.75ML SOAJ, Inject 2.4 mg,sub-q, into the skin once a week for weight loss, Disp: 3 mL, Rfl: 0   WEGOVY 2.4 MG/0.75ML SOAJ, Inject 2.4 mg into the skin once a week for weight loss., Disp: 3 mL, Rfl: 0   WEGOVY 2.4 MG/0.75ML SOAJ, Inject 2.4 mg into the skin once a week for weight loss., Disp: 3 mL, Rfl: 0   WEGOVY 2.4 MG/0.75ML SOAJ, Inject 2.4 mg into the skin once a week for weight loss, Disp: 3 mL, Rfl: 0 Medication Side Effects: none  Family Medical/ Social History: Changes? No  MENTAL HEALTH EXAM:  There were no vitals taken for this visit.There is no height or weight on file to calculate BMI.  General Appearance: Casual and Well Groomed  Eye Contact:  Good  Speech:  Clear and Coherent and Normal Rate  Volume:  Normal  Mood:  Euthymic  Affect:  Congruent  Thought Process:  Goal Directed and Descriptions of Associations: Circumstantial  Orientation:  Full (Time, Place, and Person)  Thought Content: Logical   Suicidal Thoughts:  No  Homicidal Thoughts:  No  Memory:  WNL  Judgement:  Good  Insight:  Good  Psychomotor Activity:  Normal  Concentration:  Concentration: Good and Attention Span: Good  Recall:  Good  Fund of Knowledge: Good  Language: Good  Assets:  Communication Skills Desire for Improvement Financial Resources/Insurance Housing Resilience Transportation  ADL's:  Intact  Cognition: WNL  Prognosis:  Good   Patient was administered Spravato 84 mg intranasally today.  The patient experienced the typical dissociation which gradually resolved over the 2-hour period of observation.  There were no complications.  Specifically the patient did not have nausea or vomiting or headache.  Blood pressures were much better today and remained  within normal ranges at the 40-minute and 2-hour follow-up intervals. Pulse ox nl.  By the time the 2-hour  observation period was met the patient was alert and oriented and able to exit without assistance.  Patient feels the Spravato administration is helpful for the treatment resistant depression and would like to continue the treatment.  See nursing note for further details.  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 Generalized anxiety disorder  F41.1     3. Situational anxiety  F41.8      Receiving Psychotherapy: No   RECOMMENDATIONS:   PDMP reviewed.  Spravato filled 11/26/2023.   I provided 50 minutes of face to face time during this encounter, including time spent before and after the visit in records review, medical decision making, counseling pertinent to today's visit, and charting.   [name] is still responding to her current medications so no changes will be made.  Continue all current meds as per med list. Return in 1 week.  Melony Overly, PA-C

## 2023-11-29 ENCOUNTER — Other Ambulatory Visit (HOSPITAL_COMMUNITY): Payer: Self-pay

## 2023-11-29 ENCOUNTER — Other Ambulatory Visit: Payer: Self-pay

## 2023-11-29 MED ORDER — BUPROPION HCL ER (XL) 150 MG PO TB24
150.0000 mg | ORAL_TABLET | ORAL | 0 refills | Status: DC
Start: 2023-11-29 — End: 2024-02-20
  Filled 2023-11-29: qty 90, 90d supply, fill #0

## 2023-11-29 MED ORDER — SPRAVATO (84 MG DOSE) 28 MG/DEVICE NA SOPK
PACK | NASAL | 3 refills | Status: AC
Start: 2023-11-29 — End: ?
  Filled 2023-11-29: qty 3, 7d supply, fill #0
  Filled 2023-12-06: qty 3, 7d supply, fill #1
  Filled 2023-12-12: qty 3, 7d supply, fill #2
  Filled 2023-12-20: qty 3, 7d supply, fill #3

## 2023-11-29 NOTE — Progress Notes (Signed)
 Specialty Pharmacy Refill Coordination Note  Robin Stevenson is a 48 y.o. female contacted today regarding refills of specialty medication(s) Esketamine HCl (Spravato (84 MG Dose))   Patient requested Courier to Provider Office   Delivery date: 12/03/23   Verified address: [facility] [address], [address], [place], [place]   Medication will be filled on 12/03/23. Same day delivery, signature required.   REMs authorization information will be added on the date of dispense.

## 2023-12-03 ENCOUNTER — Other Ambulatory Visit: Payer: Self-pay

## 2023-12-03 NOTE — Progress Notes (Signed)
 12/03/23 REMS Authorization Code: [id number]

## 2023-12-04 ENCOUNTER — Other Ambulatory Visit (HOSPITAL_COMMUNITY): Payer: Self-pay

## 2023-12-04 ENCOUNTER — Ambulatory Visit: Admitting: Adult Health

## 2023-12-04 ENCOUNTER — Ambulatory Visit

## 2023-12-04 ENCOUNTER — Encounter: Payer: Self-pay | Admitting: Adult Health

## 2023-12-04 VITALS — BP 125/83 | HR 74

## 2023-12-04 DIAGNOSIS — F339 Major depressive disorder, recurrent, unspecified: Secondary | ICD-10-CM

## 2023-12-04 NOTE — Progress Notes (Signed)
 Robin Stevenson [id number] 1976-05-26 48 y.o.  Subjective:   Patient ID:  Robin Stevenson is a 48 y.o. (DOB [birth year]) female.  Chief Complaint: No chief complaint on file.   HPI Robin Stevenson presents to the office today for follow-up of  treatment resistant depression (TRD).  Spravato treatment    Patient was administered Spravato 84 mg intranasally today. Patient was observed by provider throughout Spravato treatment. The patient experienced the typical dissociation which gradually resolved over the 2-hour period of observation. She did not feel sleepy, decreased feeling of sensitivity (numbness), lack of energy, increased blood pressure, feeling happy or very excited, or headache. She denied feeling disconnected from themself, their thoughts, feelings and things around them. Blood pressures remained within normal ranges at the 40-minute and 2-hour follow-up intervals. By the time the 2-hour observation period was met the patient was alert and oriented and able to exit without assistance. Patient willing to continue Spravato administration for the treatment of resistant depression. See nursing note for further details.  [name] continues to do well with current treatment regimen. She reports stable energy and motivation. She denies feelings of sadness and hopelessness. She reports completing ADLs and personal hygiene. She reports stable focus and concentration. Appetite is adquate and her weight is stable. She reports sleeping well most nights. Denies suicidal or homicidal thoughts.  BP checks at home are within normal range.   Review of Systems:  Review of Systems  Musculoskeletal:  Negative for gait problem.  Neurological:  Negative for tremors.  Psychiatric/Behavioral:         Please refer to HPI    Medications: I have reviewed the patient's current medications.  Current Outpatient Medications  Medication Sig Dispense Refill   ALPRAZolam (XANAX) 0.5 MG tablet Take 1  tablet at bedtime the night before procedure. Take 1 tablet 1 hour before arrival. Bring third tablet with you to procedure. 3 tablet 0   atorvastatin (LIPITOR) 20 MG tablet Take 1 tablet (20 mg total) by mouth daily. 90 tablet 3   atovaquone-proguanil (MALARONE) 250-100 MG TABS tablet Take 1 tablet by mouth once daily begin 2 days prearrival,take daily during stay & continue for 7 days posttravel for malaria prevention 24 tablet 0   azithromycin (ZITHROMAX) 500 MG tablet For non bloody diarrhea,take 2 tabs on day 1, if resolved,stop med,If diarrhea persists 1 tab on day 2 & 3. For bloody diarrhea,2 tabs on day 1 & 1 tab on day 2 & 3 4 tablet 0   buPROPion (WELLBUTRIN XL) 150 MG 24 hr tablet Take 1 tablet (150 mg total) by mouth every morning. 90 tablet 0   cariprazine (VRAYLAR) 1.5 MG capsule Take 1 capsule (1.5 mg total) by mouth daily. 28 capsule 0   celecoxib (CELEBREX) 200 MG capsule Take 1 capsule by mouth single dose as directed. Take 1 hour prior to arrival for surgery 1 capsule 0   Esketamine HCl, 56 MG Dose, (SPRAVATO, 56 MG DOSE,) 28 MG/DEVICE SOPK Dispense X 2 (28mg ) devices, administer on day 1 intranasally as directed 4 each 0   Esketamine HCl, 84 MG Dose, (SPRAVATO, 84 MG DOSE,) 28 MG/DEVICE SOPK Dispense X 3 (28 mg) devices for treatments every 7 days, administer intranasally 3 each 3   esomeprazole (NEXIUM) 20 MG capsule Take 20 mg by mouth once.     FETZIMA 120 MG CP24   3   fluocinonide ointment (LIDEX) 0.05 % Apply as directed to skin twice a day 30 g 0  gabapentin (NEURONTIN) 300 MG capsule Take 1 capsule (300 mg total) by mouth 3 (three) times daily. Take first dose 1 hour before arrival for surgery. 21 capsule 0   ketoconazole (NIZORAL) 2 % cream Apply a small amount to affected area on left foot/toe once a day 30 g 0   Levomilnacipran HCl ER (FETZIMA) 120 MG CP24 Take 1 capsule by mouth daily. 90 capsule 3   meclizine (ANTIVERT) 25 MG tablet Take 1 tablet (25 mg total) by  mouth as directed prior to treatment. 60 tablet 0   metFORMIN (GLUCOPHAGE) 500 MG tablet Take 1 tablet (500 mg total) by mouth daily. 90 tablet 1   metFORMIN (GLUCOPHAGE) 500 MG tablet Take 1 tablet (500 mg total) by mouth 2 (two) times daily. 180 tablet 1   metFORMIN (GLUCOPHAGE) 500 MG tablet Take 1 tablet (500 mg total) by mouth 2 (two) times daily. 90 tablet 1   metFORMIN (GLUCOPHAGE-XR) 500 MG 24 hr tablet Take 1 tablet (500 mg total) by mouth daily. Please do not fill if not picked up within 30 days. 30 tablet 0   methocarbamol (ROBAXIN) 500 MG tablet Take 1 tablet (500 mg total) by mouth every 6 (six) hours as needed for muscle spasms. (Patient not taking: Reported on 10/29/2023) 45 tablet 0   ondansetron (ZOFRAN) 4 MG tablet Take 1 tablet  by mouth every 6 hours as needed for nausea (Patient not taking: Reported on 11/28/2023) 15 tablet 0   ondansetron (ZOFRAN) 4 MG tablet Take 1 tablet (4 mg total) by mouth every 6 (six) hours for nausea. TAKE 4 tablets 15 minutes prior to arrival for surgery 30 tablet 0   ondansetron (ZOFRAN-ODT) 4 MG disintegrating tablet Dissolve 1 tablet (4 mg total) by mouth every 8 (eight) hours as needed for nausea or vomiting. 20 tablet 1   oxyCODONE (OXY IR/ROXICODONE) 5 MG immediate release tablet Take 1-2 tablet by mouth every six to eight hours as needed for Post Op Pain. DO NOT EXCEED  6 tablets in 24 hours. 30 tablet 0   polyethylene glycol (MIRALAX / GLYCOLAX) packet Take 17 g by mouth daily.     polyethylene glycol-electrolytes (NULYTELY) 420 g solution Take 4,000 mLs by mouth Nightly. 4000 mL 0   Semaglutide-Weight Management (WEGOVY) 2.4 MG/0.75ML SOAJ Inject 2.4 mg into the skin once a week for weight loss. 3 mL 0   sulfamethoxazole-trimethoprim (BACTRIM DS) 800-160 MG tablet Take 1 tablet by mouth 2 (two) times daily as directed. Start the day after surgery. 10 tablet 0   SUMAtriptan (IMITREX) 50 MG tablet TAKE 1 TABLET BY MOUTH AT ONSET OF HEADACHE, MAY  REPEAT 1 DOSE AFTER 2 HOURS IF NEEDED 10 tablet 5   SUMAtriptan (IMITREX) 50 MG tablet Take 1 tablet by mouth at onset of headache, may repeat same dose once 2 hours later if needed 10 tablet 5   SUMAtriptan (IMITREX) 50 MG tablet Take 1 tablet (50 mg total) by mouth at the onset of headache. May repeat 1 dose after 2 hours if needed. 10 tablet 5   SUMAtriptan (IMITREX) 50 MG tablet Take 1 tablet (50 mg total) by mouth at onset of headache,may repeat 1 dose after 2 hours if needed, 2 to 3 times a week 9 tablet 6   SUMAtriptan (IMITREX) 50 MG tablet Take 1 tablet (50 mg total) by mouth as needed at onset of headache. May repeat 1 dose after 2 hours if needed. Only take 2-3 times a week. 9 tablet 6  terbinafine (LAMISIL) 250 MG tablet Take 1 tablet (250 mg total) by mouth daily for Tinea 14 tablet 0   traZODone (DESYREL) 50 MG tablet Take 50 mg by mouth at bedtime.     traZODone (DESYREL) 50 MG tablet TAKE 1 TO 2 TABLETS BY MOUTH AT BEDTIME 180 tablet 1   traZODone (DESYREL) 50 MG tablet Take 1-2 tablets (50-100 mg total) by mouth at bedtime. 180 tablet 1   traZODone (DESYREL) 50 MG tablet Take 1-2 tablets (50-100 mg total) by mouth at bedtime. 180 tablet 2   WEGOVY 0.5 MG/0.5ML SOAJ Inject 0.5 mg into the skin once a week for weight loss. 2 mL 0   WEGOVY 1 MG/0.5ML SOAJ Inject 1 mg into the skin once a week for weight loss 2 mL 0   WEGOVY 2.4 MG/0.75ML SOAJ Inject 2.4 mg into the skin once a week for weight loss. 3 mL 0   WEGOVY 2.4 MG/0.75ML SOAJ Inject 2.4 mg into the skin once a week fro weight loss 3 mL 0   WEGOVY 2.4 MG/0.75ML SOAJ Inject 2.4 mg into the skin once a week for weight loss 3 mL 0   WEGOVY 2.4 MG/0.75ML SOAJ Inject 2.4 mg,sub-q, into the skin once a week for weight loss 3 mL 0   WEGOVY 2.4 MG/0.75ML SOAJ Inject 2.4 mg into the skin once a week for weight loss. 3 mL 0   WEGOVY 2.4 MG/0.75ML SOAJ Inject 2.4 mg into the skin once a week for weight loss. 3 mL 0   WEGOVY 2.4 MG/0.75ML  SOAJ Inject 2.4 mg into the skin once a week for weight loss 3 mL 0   No current facility-administered medications for this visit.    Medication Side Effects: None  Allergies:  Allergies  Allergen Reactions   Cat Dander Anaphylaxis   Brewers Yeast Rash    Past Medical History:  Diagnosis Date   Depression    takes Welbutrin and Fetzima   GERD (gastroesophageal reflux disease)    takes nexium    Past Medical History, Surgical history, Social history, and Family history were reviewed and updated as appropriate.   Please see review of systems for further details on the patient's review from today.   Objective:   Physical Exam:  There were no vitals taken for this visit.  Physical Exam Constitutional:      General: She is not in acute distress. Musculoskeletal:        General: No deformity.  Neurological:     Mental Status: She is alert and oriented to person, place, and time.     Coordination: Coordination normal.  Psychiatric:        Attention and Perception: Attention and perception normal. She does not perceive auditory or visual hallucinations.        Mood and Affect: Mood normal. Affect is not labile, blunt, angry or inappropriate.        Speech: Speech normal.        Behavior: Behavior normal.        Thought Content: Thought content normal. Thought content is not paranoid or delusional. Thought content does not include homicidal or suicidal ideation. Thought content does not include homicidal or suicidal plan.        Cognition and Memory: Cognition and memory normal.        Judgment: Judgment normal.     Comments: Insight intact     Lab Review:     Component Value Date/Time   NA 140 09/01/2016  0852   K 4.4 09/01/2016 0852   CL 104 09/01/2016 0852   CO2 31 09/01/2016 0852   GLUCOSE 90 09/01/2016 0852   BUN 14 09/01/2016 0852   CREATININE 0.86 09/01/2016 0852   CALCIUM 9.2 09/01/2016 0852   PROT 7.0 09/01/2016 0852   ALBUMIN 4.3 09/01/2016 0852   AST  33 09/01/2016 0852   ALT 70 (H) 09/01/2016 0852   ALKPHOS 66 09/01/2016 0852   BILITOT 0.7 09/01/2016 0852       Component Value Date/Time   WBC 5.1 09/01/2016 0852   RBC 4.30 09/01/2016 0852   HGB 13.5 09/01/2016 0852   HCT 38.9 09/01/2016 0852   PLT 286.0 09/01/2016 0852   MCV 90.4 09/01/2016 0852   MCHC 34.6 09/01/2016 0852   RDW 11.9 09/01/2016 0852   LYMPHSABS 1.5 09/01/2016 0852   MONOABS 0.4 09/01/2016 0852   EOSABS 0.1 09/01/2016 0852   BASOSABS 0.0 09/01/2016 0852    No results found for: "POCLITH", "LITHIUM"   No results found for: "PHENYTOIN", "PHENOBARB", "VALPROATE", "CBMZ"   .res Assessment: Plan:    RECOMMENDATIONS:    PDMP reviewed.  Spravato filled 12/04/2023.  I provided 50 minutes of face to face time during this encounter, including time spent before and after the visit in records review, medical decision making, counseling pertinent to today's visit, and charting.   [name] continues to respond well to the Spravato.  Continue current medications as prescribed.   Return in 1 week.  Diagnoses and all orders for this visit:  Recurrent major depression resistant to treatment [facility])     Please see After Visit Summary for patient specific instructions.  Future Appointments  Date Time Provider Department Center  12/12/2023 10:00 AM Verneda Golder, PA-C [facility] None  12/12/2023 10:00 AM CP-NURSE [facility] None  04/15/2024 10:20 AM [facility]    No orders of the defined types were placed in this encounter.   -------------------------------

## 2023-12-04 NOTE — Progress Notes (Signed)
 NURSES NOTE:   Pt arrived for her # 34 Spravato Treatment for treatment resistant depression, the starting dose was 56 mg (2 of the 28 mg nasal sprays) pt stayed at that dose for the first 4 treatments and also takes Zofran and Meclizine prior to arrival at office. Pt received 84 mg (3 of the 28 mg nasal sprays) total again today and did also premedicate. [facility] had been advised on her treatments and doses needed. Pt sees Irving Mantle, NP and she will follow her throughout her treatments and follow ups.  Pt's Spravato is ordered through [facility].  Spravato medication is stored at treatment center per REMS/FDA guidelines. The medication is required to be locked behind two doors per REMS/FDA protocol. Medication is also disposed of properly after each use per regulations. All documentation for REMS is completed and submitted per FDA/REMS requirements. Pt's prior authorization was submitted and approved for Spravato 84 mg effective 09/24/2023-09/23/2024. Max daily dose is 0.43         Pt directed to treatment room, started vitals at 10:11 AM, 126/78, pulse 76, SpO2 98%. Pt did take Zofran 4 mg and Meclizine 25 mg prior to arriving at office, an hour ahead. Instructed patient to blow her nose if needed then recline back to a 45 degree angle if that was more comfortable for her. Gave patient first dose 28 mg nasal spray, administered in each nostril as directed and observed by nurse, waited 5 more minutes for the second and third doses. Placed a fan in her room. After all doses given pt did not complain of nausea. pt keeps water and some mints to help with the taste of Spravato it gives the metallic taste. Her 40 minute check was at 10:45 AM, vital signs were 136/90, pulse 74, SpO2 97%. Pt aware she would be monitored for a total time of 120 minutes.  Discharge vitals were taken at 11:55 AM 125/83, P 74, SpO2 99%. Pt met with Marvia Slocumb, PA-C to discuss her  treatment. Pt advised to relax the rest of the day. No driving, no intense activities. Verbalized understanding. Pt reports no issues today. Nurse was with pt a total of 60 minutes for clinical assessment. Pt will return on, Wednesday, April 23rd. Pt instructed to call office with any problems or questions prior to next treatment.      LOT [id number] EXP JAN 2028

## 2023-12-05 ENCOUNTER — Other Ambulatory Visit (HOSPITAL_COMMUNITY): Payer: Self-pay

## 2023-12-05 ENCOUNTER — Other Ambulatory Visit: Payer: Self-pay

## 2023-12-05 DIAGNOSIS — L820 Inflamed seborrheic keratosis: Secondary | ICD-10-CM | POA: Diagnosis not present

## 2023-12-05 MED ORDER — HYDROCORTISONE 2.5 % EX CREA
1.0000 | TOPICAL_CREAM | Freq: Two times a day (BID) | CUTANEOUS | 0 refills | Status: AC
  Filled 2023-12-05: qty 20, 14d supply, fill #0

## 2023-12-06 ENCOUNTER — Other Ambulatory Visit: Payer: Self-pay

## 2023-12-06 NOTE — Progress Notes (Signed)
 Specialty Pharmacy Refill Coordination Note  Robin Stevenson is a 48 y.o. female assessed today regarding refills of clinic administered specialty medication(s) Esketamine HCl (Spravato (84 MG Dose))   Clinic requested Courier to Provider Office   Delivery date: 12/10/23   Verified address: [facility] [address], [address], [place], [place]   Medication will be filled on 12/10/23.   Sw [name], next appointment 4/23.

## 2023-12-10 ENCOUNTER — Other Ambulatory Visit: Payer: Self-pay

## 2023-12-10 ENCOUNTER — Other Ambulatory Visit (HOSPITAL_COMMUNITY): Payer: Self-pay

## 2023-12-10 NOTE — Progress Notes (Signed)
 12/10/23 REMS Authorization Code: [id number]

## 2023-12-10 NOTE — Progress Notes (Signed)
 Same-Day courier has been set up on 12/10/23. Signature is required.  Control Number: [id number]  [facility] [address], [address], [place], [id number]

## 2023-12-12 ENCOUNTER — Encounter: Payer: Self-pay | Admitting: Physician Assistant

## 2023-12-12 ENCOUNTER — Other Ambulatory Visit: Payer: Self-pay

## 2023-12-12 ENCOUNTER — Ambulatory Visit

## 2023-12-12 ENCOUNTER — Ambulatory Visit (INDEPENDENT_AMBULATORY_CARE_PROVIDER_SITE_OTHER): Admitting: Physician Assistant

## 2023-12-12 VITALS — BP 130/90 | HR 71

## 2023-12-12 DIAGNOSIS — F339 Major depressive disorder, recurrent, unspecified: Secondary | ICD-10-CM

## 2023-12-12 DIAGNOSIS — F411 Generalized anxiety disorder: Secondary | ICD-10-CM | POA: Diagnosis not present

## 2023-12-12 NOTE — Progress Notes (Signed)
 NURSES NOTE:   Pt arrived for her # 27 Spravato  Treatment for treatment resistant depression, the starting dose was 56 mg (2 of the 28 mg nasal sprays) pt stayed at that dose for the first 4 treatments and also takes Zofran  and Meclizine  prior to arrival at office. Pt received 84 mg (3 of the 28 mg nasal sprays) total again today and did also premedicate. [facility] had been advised on her treatments and doses needed. Pt sees Irving Mantle, NP and she will follow her throughout her treatments and follow ups.  Pt's Spravato  is ordered through [facility].  Spravato  medication is stored at treatment center per REMS/FDA guidelines. The medication is required to be locked behind two doors per REMS/FDA protocol. Medication is also disposed of properly after each use per regulations. All documentation for REMS is completed and submitted per FDA/REMS requirements. Pt's prior authorization was submitted and approved for Spravato  84 mg effective 09/24/2023-09/23/2024. Max daily dose is 0.43         Pt directed to treatment room, started vitals at 10:05 AM, 131/85, pulse 70, SpO2 98%. Pt did take Zofran  4 mg and Meclizine  25 mg prior to arriving at office, an hour ahead. Instructed patient to blow her nose if needed then recline back to a 45 degree angle if that was more comfortable for her. Gave patient first dose 28 mg nasal spray, administered in each nostril as directed and observed by nurse, waited 5 more minutes for the second and third doses. Placed a fan in her room. After all doses given pt did not complain of nausea. pt keeps water and some mints to help with the taste of Spravato  it gives the metallic taste. Her 40 minute check was at 10:50 AM, vital signs were 145/91, pulse 72, SpO2 99%. Pt aware she would be monitored for a total time of 120 minutes.  Discharge vitals were taken at 12:09 AM 130/90, P 71, SpO2 99%. Pt met with Marvia Slocumb, PA-C to discuss her  treatment. Pt advised to relax the rest of the day. No driving, no intense activities. Verbalized understanding. Pt reports no issues today. Nurse was with pt a total of 60 minutes for clinical assessment. Pt would like to return, Monday, April 28th. I will double check with her pharmacy to be able to have her medication by then. Pt instructed to call office with any problems or questions prior to next treatment.      LOT [id number] EXP OCT 2027

## 2023-12-12 NOTE — Progress Notes (Signed)
 [facility]  Patient ID: Robin Stevenson,  MRN: [id number]  PCP: Robin Saunas, MD (Inactive)  Date of Evaluation: 12/12/2023 Time spent:55 minutes  Chief Complaint:  Chief Complaint   Depression; Other    HISTORY/CURRENT STATUS: HPI for Spravato  treatment, she sees Robin Mantle, NP who is out of the office today.  Robin Stevenson is still doing well and is responding to the Spravato . Patient is able to enjoy things.  Energy and motivation are good.   No extreme sadness, tearfulness, or feelings of hopelessness.  Sleeps ok. ADLs and personal hygiene are normal.   Denies any changes in concentration, making decisions, or remembering things.  Appetite has not changed.  Weight is stable.  Anxiety is controlled.  Denies suicidal or homicidal thoughts.  Patient denies increased energy with decreased need for sleep, increased talkativeness, racing thoughts, impulsivity or risky behaviors, increased spending, increased libido, grandiosity, increased irritability or anger, paranoia, or hallucinations.  Review of Systems  Constitutional: Negative.   HENT: Negative.    Eyes: Negative.   Respiratory: Negative.    Cardiovascular: Negative.   Gastrointestinal: Negative.   Genitourinary: Negative.   Musculoskeletal: Negative.   Skin: Negative.   Neurological: Negative.   Endo/Heme/Allergies: Negative.   Psychiatric/Behavioral:         See HPI   Individual Medical History/ Review of Systems: Changes? :No   Past medications for mental health diagnoses include: Spravato , Xanax , Wellbutrin , Vraylar , gabapentin , Fetzima , trazodone   Allergies: Cat dander and Brewers yeast  Current Medications:  Current Outpatient Medications:    ALPRAZolam  (XANAX ) 0.5 MG tablet, Take 1 tablet at bedtime the night before procedure. Take 1 tablet 1 hour before arrival. Bring third tablet with you to procedure., Disp: 3 tablet, Rfl: 0   atorvastatin  (LIPITOR) 20 MG tablet, Take 1 tablet (20 mg total) by  mouth daily., Disp: 90 tablet, Rfl: 3   buPROPion  (WELLBUTRIN  XL) 150 MG 24 hr tablet, Take 1 tablet (150 mg total) by mouth every morning., Disp: 90 tablet, Rfl: 0   cariprazine  (VRAYLAR ) 1.5 MG capsule, Take 1 capsule (1.5 mg total) by mouth daily., Disp: 28 capsule, Rfl: 0   celecoxib  (CELEBREX ) 200 MG capsule, Take 1 capsule by mouth single dose as directed. Take 1 hour prior to arrival for surgery, Disp: 1 capsule, Rfl: 0   Esketamine HCl, 84 MG Dose, (SPRAVATO , 84 MG DOSE,) 28 MG/DEVICE SOPK, Dispense X 3 (28 mg) devices for treatments every 7 days, administer intranasally, Disp: 3 each, Rfl: 3   esomeprazole (NEXIUM) 20 MG capsule, Take 20 mg by mouth once., Disp: , Rfl:    gabapentin  (NEURONTIN ) 300 MG capsule, Take 1 capsule (300 mg total) by mouth 3 (three) times daily. Take first dose 1 hour before arrival for surgery., Disp: 21 capsule, Rfl: 0   Levomilnacipran  HCl ER (FETZIMA ) 120 MG CP24, Take 1 capsule by mouth daily., Disp: 90 capsule, Rfl: 3   meclizine  (ANTIVERT ) 25 MG tablet, Take 1 tablet (25 mg total) by mouth as directed prior to treatment., Disp: 60 tablet, Rfl: 0   metFORMIN  (GLUCOPHAGE ) 500 MG tablet, Take 1 tablet (500 mg total) by mouth daily., Disp: 90 tablet, Rfl: 1   metFORMIN  (GLUCOPHAGE ) 500 MG tablet, Take 1 tablet (500 mg total) by mouth 2 (two) times daily., Disp: 180 tablet, Rfl: 1   metFORMIN  (GLUCOPHAGE -XR) 500 MG 24 hr tablet, Take 1 tablet (500 mg total) by mouth daily. Please do not fill if not picked up within 30 days., Disp: 30 tablet,  Rfl: 0   ondansetron  (ZOFRAN ) 4 MG tablet, Take 1 tablet (4 mg total) by mouth every 6 (six) hours for nausea. TAKE 4 tablets 15 minutes prior to arrival for surgery, Disp: 30 tablet, Rfl: 0   SUMAtriptan  (IMITREX ) 50 MG tablet, Take 1 tablet (50 mg total) by mouth as needed at onset of headache. May repeat 1 dose after 2 hours if needed. Only take 2-3 times a week., Disp: 9 tablet, Rfl: 6   traZODone  (DESYREL ) 50 MG tablet,  Take 50 mg by mouth at bedtime., Disp: , Rfl:    WEGOVY  2.4 MG/0.75ML SOAJ, Inject 2.4 mg into the skin once a week for weight loss, Disp: 3 mL, Rfl: 0   atovaquone -proguanil (MALARONE ) 250-100 MG TABS tablet, Take 1 tablet by mouth once daily begin 2 days prearrival,take daily during stay & continue for 7 days posttravel for malaria prevention, Disp: 24 tablet, Rfl: 0   azithromycin  (ZITHROMAX ) 500 MG tablet, For non bloody diarrhea,take 2 tabs on day 1, if resolved,stop med,If diarrhea persists 1 tab on day 2 & 3. For bloody diarrhea,2 tabs on day 1 & 1 tab on day 2 & 3, Disp: 4 tablet, Rfl: 0   Esketamine HCl, 56 MG Dose, (SPRAVATO , 56 MG DOSE,) 28 MG/DEVICE SOPK, Dispense X 2 (28mg ) devices, administer on day 1 intranasally as directed, Disp: 4 each, Rfl: 0   FETZIMA  120 MG CP24, , Disp: , Rfl: 3   fluocinonide  ointment (LIDEX ) 0.05 %, Apply as directed to skin twice a day, Disp: 30 g, Rfl: 0   hydrocortisone  2.5 % cream, Apply a small amount to affected area twice daily for 1-2 weeks as directed., Disp: 20 g, Rfl: 0   ketoconazole  (NIZORAL ) 2 % cream, Apply a small amount to affected area on left foot/toe once a day, Disp: 30 g, Rfl: 0   metFORMIN  (GLUCOPHAGE ) 500 MG tablet, Take 1 tablet (500 mg total) by mouth 2 (two) times daily., Disp: 90 tablet, Rfl: 1   methocarbamol  (ROBAXIN ) 500 MG tablet, Take 1 tablet (500 mg total) by mouth every 6 (six) hours as needed for muscle spasms. (Patient not taking: Reported on 10/29/2023), Disp: 45 tablet, Rfl: 0   ondansetron  (ZOFRAN ) 4 MG tablet, Take 1 tablet  by mouth every 6 hours as needed for nausea (Patient not taking: Reported on 11/28/2023), Disp: 15 tablet, Rfl: 0   ondansetron  (ZOFRAN -ODT) 4 MG disintegrating tablet, Dissolve 1 tablet (4 mg total) by mouth every 8 (eight) hours as needed for nausea or vomiting., Disp: 20 tablet, Rfl: 1   oxyCODONE  (OXY IR/ROXICODONE ) 5 MG immediate release tablet, Take 1-2 tablet by mouth every six to eight hours as  needed for Post Op Pain. DO NOT EXCEED  6 tablets in 24 hours., Disp: 30 tablet, Rfl: 0   polyethylene glycol (MIRALAX / GLYCOLAX) packet, Take 17 g by mouth daily., Disp: , Rfl:    polyethylene glycol-electrolytes (NULYTELY) 420 g solution, Take 4,000 mLs by mouth Nightly., Disp: 4000 mL, Rfl: 0   Semaglutide -Weight Management (WEGOVY ) 2.4 MG/0.75ML SOAJ, Inject 2.4 mg into the skin once a week for weight loss., Disp: 3 mL, Rfl: 0   sulfamethoxazole -trimethoprim  (BACTRIM  DS) 800-160 MG tablet, Take 1 tablet by mouth 2 (two) times daily as directed. Start the day after surgery., Disp: 10 tablet, Rfl: 0   SUMAtriptan  (IMITREX ) 50 MG tablet, TAKE 1 TABLET BY MOUTH AT ONSET OF HEADACHE, MAY REPEAT 1 DOSE AFTER 2 HOURS IF NEEDED, Disp: 10 tablet, Rfl: 5  SUMAtriptan  (IMITREX ) 50 MG tablet, Take 1 tablet by mouth at onset of headache, may repeat same dose once 2 hours later if needed, Disp: 10 tablet, Rfl: 5   SUMAtriptan  (IMITREX ) 50 MG tablet, Take 1 tablet (50 mg total) by mouth at the onset of headache. May repeat 1 dose after 2 hours if needed., Disp: 10 tablet, Rfl: 5   SUMAtriptan  (IMITREX ) 50 MG tablet, Take 1 tablet (50 mg total) by mouth at onset of headache,may repeat 1 dose after 2 hours if needed, 2 to 3 times a week, Disp: 9 tablet, Rfl: 6   terbinafine  (LAMISIL ) 250 MG tablet, Take 1 tablet (250 mg total) by mouth daily for Tinea, Disp: 14 tablet, Rfl: 0   traZODone  (DESYREL ) 50 MG tablet, TAKE 1 TO 2 TABLETS BY MOUTH AT BEDTIME, Disp: 180 tablet, Rfl: 1   traZODone  (DESYREL ) 50 MG tablet, Take 1-2 tablets (50-100 mg total) by mouth at bedtime., Disp: 180 tablet, Rfl: 1   traZODone  (DESYREL ) 50 MG tablet, Take 1-2 tablets (50-100 mg total) by mouth at bedtime., Disp: 180 tablet, Rfl: 2   WEGOVY  0.5 MG/0.5ML SOAJ, Inject 0.5 mg into the skin once a week for weight loss., Disp: 2 mL, Rfl: 0   WEGOVY  1 MG/0.5ML SOAJ, Inject 1 mg into the skin once a week for weight loss, Disp: 2 mL, Rfl: 0    WEGOVY  2.4 MG/0.75ML SOAJ, Inject 2.4 mg into the skin once a week for weight loss., Disp: 3 mL, Rfl: 0   WEGOVY  2.4 MG/0.75ML SOAJ, Inject 2.4 mg into the skin once a week fro weight loss, Disp: 3 mL, Rfl: 0   WEGOVY  2.4 MG/0.75ML SOAJ, Inject 2.4 mg into the skin once a week for weight loss, Disp: 3 mL, Rfl: 0   WEGOVY  2.4 MG/0.75ML SOAJ, Inject 2.4 mg,sub-q, into the skin once a week for weight loss, Disp: 3 mL, Rfl: 0   WEGOVY  2.4 MG/0.75ML SOAJ, Inject 2.4 mg into the skin once a week for weight loss., Disp: 3 mL, Rfl: 0   WEGOVY  2.4 MG/0.75ML SOAJ, Inject 2.4 mg into the skin once a week for weight loss., Disp: 3 mL, Rfl: 0 Medication Side Effects: none  Family Medical/ Social History: Changes? No  MENTAL HEALTH EXAM:  There were no vitals taken for this visit.There is no height or weight on file to calculate BMI.  General Appearance: Casual and Well Groomed  Eye Contact:  Good  Speech:  Clear and Coherent and Normal Rate  Volume:  Normal  Mood:  Euthymic  Affect:  Congruent  Thought Process:  Goal Directed and Descriptions of Associations: Circumstantial  Orientation:  Full (Time, Place, and Person)  Thought Content: Logical   Suicidal Thoughts:  No  Homicidal Thoughts:  No  Memory:  WNL  Judgement:  Good  Insight:  Good  Psychomotor Activity:  Normal  Concentration:  Concentration: Good and Attention Span: Good  Recall:  Good  Fund of Knowledge: Good  Language: Good  Assets:  Communication Skills Desire for Improvement Financial Resources/Insurance Housing Physical Health Resilience Transportation  ADL's:  Intact  Cognition: WNL  Prognosis:  Good   Patient was administered Spravato  84 mg intranasally today.  The patient experienced the typical dissociation which gradually resolved over the 2-hour period of observation.  There were no complications.  Specifically the patient did not have nausea or vomiting or headache.  Blood pressures were much better today and  remained within normal ranges at the 40-minute and 2-hour  follow-up intervals. Pulse ox nl.  By the time the 2-hour observation period was met the patient was alert and oriented and able to exit without assistance.  Patient feels the Spravato  administration is helpful for the treatment resistant depression and would like to continue the treatment.  See nursing note for further details.  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 Generalized anxiety disorder  F41.1      Receiving Psychotherapy: No   RECOMMENDATIONS:   PDMP reviewed.  Spravato  filled 12/10/2023. I provided 55 minutes of face to face time during this encounter, including time spent before and after the visit in records review, medical decision making, counseling pertinent to today's visit, and charting.   Robin Stevenson is still responding to the Spravato  once a week.  Continue same treatment. Continue all current meds as per med list. Return in 1 week.  Marvia Slocumb, PA-C

## 2023-12-12 NOTE — Progress Notes (Signed)
 Specialty Pharmacy Refill Coordination Note  Robin Stevenson is a 48 y.o. female assessed today regarding refills of clinic administered specialty medication(s) Esketamine HCl (Spravato  (84 MG Dose))   Clinic requested Courier to Provider Office   Delivery date: 12/17/23   Verified address: [facility] [address], [place], [id number]   Medication will be filled on 12/17/23.   Per [name] patient was trying to schedule appt for 4/28. Informed that is the soonest we can fill per insurance and if she wants to come in late afternoon that day courier should be there in time. Robin Stevenson will discuss with patient, we will send on Monday either way.

## 2023-12-17 ENCOUNTER — Ambulatory Visit

## 2023-12-17 ENCOUNTER — Encounter: Payer: Self-pay | Admitting: Physician Assistant

## 2023-12-17 ENCOUNTER — Ambulatory Visit (INDEPENDENT_AMBULATORY_CARE_PROVIDER_SITE_OTHER): Admitting: Physician Assistant

## 2023-12-17 ENCOUNTER — Other Ambulatory Visit: Payer: Self-pay

## 2023-12-17 ENCOUNTER — Other Ambulatory Visit (HOSPITAL_COMMUNITY): Payer: Self-pay

## 2023-12-17 VITALS — BP 134/82 | HR 76

## 2023-12-17 DIAGNOSIS — F339 Major depressive disorder, recurrent, unspecified: Secondary | ICD-10-CM | POA: Diagnosis not present

## 2023-12-17 DIAGNOSIS — F411 Generalized anxiety disorder: Secondary | ICD-10-CM

## 2023-12-17 MED ORDER — FETZIMA 120 MG PO CP24
1.0000 | ORAL_CAPSULE | Freq: Every day | ORAL | 3 refills | Status: DC
  Filled 2023-12-17 – 2023-12-18 (×2): qty 90, 90d supply, fill #0
  Filled 2024-03-16: qty 90, 90d supply, fill #1
  Filled 2024-06-11: qty 90, 90d supply, fill #2
  Filled 2024-09-11: qty 90, 90d supply, fill #3

## 2023-12-17 NOTE — Progress Notes (Signed)
 12/17/23 REMS Authorization Code: [id number]

## 2023-12-17 NOTE — Progress Notes (Signed)
 NURSES NOTE:   Pt arrived for her # 34 Spravato  Treatment for treatment resistant depression, the starting dose was 56 mg (2 of the 28 mg nasal sprays) pt stayed at that dose for the first 4 treatments and also takes Zofran  and Meclizine  prior to arrival at office. Pt received 84 mg (3 of the 28 mg nasal sprays) total again today and did also premedicate. [facility] had been advised on her treatments and doses needed. Pt sees Irving Mantle, NP and she will follow her throughout her treatments and follow ups.  Pt's Spravato  is ordered through [facility].  Spravato  medication is stored at treatment center per REMS/FDA guidelines. The medication is required to be locked behind two doors per REMS/FDA protocol. Medication is also disposed of properly after each use per regulations. All documentation for REMS is completed and submitted per FDA/REMS requirements. Pt's prior authorization was submitted and approved for Spravato  84 mg effective 09/24/2023-09/23/2024. Max daily dose is 0.43         Pt directed to treatment room, started vitals at 10:05 AM, 117/80, pulse 74, SpO2 99%. Pt did take Zofran  4 mg and Meclizine  25 mg prior to arriving at office, an hour ahead. Instructed patient to blow her nose if needed then recline back to a 45 degree angle if that was more comfortable for her. Gave patient first dose 28 mg nasal spray, administered in each nostril as directed and observed by nurse, waited 5 more minutes for the second and third doses. Placed a fan in her room. After all doses given pt did not complain of nausea. pt keeps water and some mints to help with the taste of Spravato  it gives the metallic taste. Her 40 minute check was at 11:00 AM, vital signs were 127/89, pulse 75, SpO2 99%. Pt aware she would be monitored for a total time of 120 minutes.  Discharge vitals were taken at 12:15 PM 134/82, P 76, SpO2 99%. Pt met with Marvia Slocumb, PA-C to discuss her  treatment. Pt advised to relax the rest of the day. No driving, no intense activities. Verbalized understanding. Pt reports no issues today. Nurse was with pt a total of 60 minutes for clinical assessment. Pt would like to return, Tuesday, May 6th. Pt instructed to call office with any problems or questions prior to next treatment.      LOT [id number] EXP JAN 2028

## 2023-12-17 NOTE — Progress Notes (Signed)
 [facility]  Patient ID: Robin Stevenson,  MRN: [id number]  PCP: Lysle Saunas, MD (Inactive)  Date of Evaluation: 12/17/2023 Time spent:60 minutes  Chief Complaint:  Chief Complaint   Depression; Other    HISTORY/CURRENT STATUS: HPI for Spravato  treatment, she sees Irving Mantle, NP who is out of the office today.  The Spravato  is still effective. She is able to enjoy things.  Energy and motivation are good. No extreme sadness, tearfulness, or feelings of hopelessness.  Sleeps well most of the time. ADLs and personal hygiene are normal.   Denies any changes in concentration, making decisions, or remembering things.  Appetite has not changed.  Weight is stable.  She does get anxious at times, takes Xanax  occasionally and it is effective.  Denies suicidal or homicidal thoughts.  Patient denies increased energy with decreased need for sleep, increased talkativeness, racing thoughts, impulsivity or risky behaviors, increased spending, increased libido, grandiosity, increased irritability or anger, paranoia, or hallucinations.  Denies dizziness, syncope, seizures, numbness, tingling, tremor, tics, unsteady gait, slurred speech, confusion. Denies muscle or joint pain, stiffness, or dystonia.  Individual Medical History/ Review of Systems: Changes? :No   Past medications for mental health diagnoses include: Spravato , Xanax , Wellbutrin , Vraylar , gabapentin , Fetzima , trazodone   Allergies: Cat dander and Brewers yeast  Current Medications:  Current Outpatient Medications:    ALPRAZolam  (XANAX ) 0.5 MG tablet, Take 1 tablet at bedtime the night before procedure. Take 1 tablet 1 hour before arrival. Bring third tablet with you to procedure., Disp: 3 tablet, Rfl: 0   atorvastatin  (LIPITOR) 20 MG tablet, Take 1 tablet (20 mg total) by mouth daily., Disp: 90 tablet, Rfl: 3   buPROPion  (WELLBUTRIN  XL) 150 MG 24 hr tablet, Take 1 tablet (150 mg total) by mouth every morning., Disp:  90 tablet, Rfl: 0   cariprazine  (VRAYLAR ) 1.5 MG capsule, Take 1 capsule (1.5 mg total) by mouth daily., Disp: 28 capsule, Rfl: 0   celecoxib  (CELEBREX ) 200 MG capsule, Take 1 capsule by mouth single dose as directed. Take 1 hour prior to arrival for surgery, Disp: 1 capsule, Rfl: 0   Esketamine HCl, 84 MG Dose, (SPRAVATO , 84 MG DOSE,) 28 MG/DEVICE SOPK, Dispense X 3 (28 mg) devices for treatments every 7 days, administer intranasally, Disp: 3 each, Rfl: 3   esomeprazole (NEXIUM) 20 MG capsule, Take 20 mg by mouth once., Disp: , Rfl:    gabapentin  (NEURONTIN ) 300 MG capsule, Take 1 capsule (300 mg total) by mouth 3 (three) times daily. Take first dose 1 hour before arrival for surgery., Disp: 21 capsule, Rfl: 0   meclizine  (ANTIVERT ) 25 MG tablet, Take 1 tablet (25 mg total) by mouth as directed prior to treatment., Disp: 60 tablet, Rfl: 0   metFORMIN  (GLUCOPHAGE ) 500 MG tablet, Take 1 tablet (500 mg total) by mouth daily., Disp: 90 tablet, Rfl: 1   ondansetron  (ZOFRAN ) 4 MG tablet, Take 1 tablet (4 mg total) by mouth every 6 (six) hours for nausea. TAKE 4 tablets 15 minutes prior to arrival for surgery, Disp: 30 tablet, Rfl: 0   SUMAtriptan  (IMITREX ) 50 MG tablet, Take 1 tablet by mouth at onset of headache, may repeat same dose once 2 hours later if needed, Disp: 10 tablet, Rfl: 5   traZODone  (DESYREL ) 50 MG tablet, Take 50 mg by mouth at bedtime., Disp: , Rfl:    atovaquone -proguanil (MALARONE ) 250-100 MG TABS tablet, Take 1 tablet by mouth once daily begin 2 days prearrival,take daily during stay & continue for 7  days posttravel for malaria prevention, Disp: 24 tablet, Rfl: 0   azithromycin  (ZITHROMAX ) 500 MG tablet, For non bloody diarrhea,take 2 tabs on day 1, if resolved,stop med,If diarrhea persists 1 tab on day 2 & 3. For bloody diarrhea,2 tabs on day 1 & 1 tab on day 2 & 3, Disp: 4 tablet, Rfl: 0   Esketamine HCl, 56 MG Dose, (SPRAVATO , 56 MG DOSE,) 28 MG/DEVICE SOPK, Dispense X 2 (28mg )  devices, administer on day 1 intranasally as directed, Disp: 4 each, Rfl: 0   fluocinonide  ointment (LIDEX ) 0.05 %, Apply as directed to skin twice a day, Disp: 30 g, Rfl: 0   hydrocortisone  2.5 % cream, Apply a small amount to affected area twice daily for 1-2 weeks as directed., Disp: 20 g, Rfl: 0   ketoconazole  (NIZORAL ) 2 % cream, Apply a small amount to affected area on left foot/toe once a day, Disp: 30 g, Rfl: 0   Levomilnacipran  HCl ER (FETZIMA ) 120 MG CP24, Take 1 capsule by mouth daily., Disp: 90 capsule, Rfl: 3   metFORMIN  (GLUCOPHAGE ) 500 MG tablet, Take 1 tablet (500 mg total) by mouth 2 (two) times daily., Disp: 180 tablet, Rfl: 1   metFORMIN  (GLUCOPHAGE ) 500 MG tablet, Take 1 tablet (500 mg total) by mouth 2 (two) times daily., Disp: 90 tablet, Rfl: 1   metFORMIN  (GLUCOPHAGE -XR) 500 MG 24 hr tablet, Take 1 tablet (500 mg total) by mouth daily. Please do not fill if not picked up within 30 days., Disp: 30 tablet, Rfl: 0   methocarbamol  (ROBAXIN ) 500 MG tablet, Take 1 tablet (500 mg total) by mouth every 6 (six) hours as needed for muscle spasms. (Patient not taking: Reported on 10/29/2023), Disp: 45 tablet, Rfl: 0   ondansetron  (ZOFRAN ) 4 MG tablet, Take 1 tablet  by mouth every 6 hours as needed for nausea (Patient not taking: Reported on 11/28/2023), Disp: 15 tablet, Rfl: 0   ondansetron  (ZOFRAN -ODT) 4 MG disintegrating tablet, Dissolve 1 tablet (4 mg total) by mouth every 8 (eight) hours as needed for nausea or vomiting., Disp: 20 tablet, Rfl: 1   oxyCODONE  (OXY IR/ROXICODONE ) 5 MG immediate release tablet, Take 1-2 tablet by mouth every six to eight hours as needed for Post Op Pain. DO NOT EXCEED  6 tablets in 24 hours., Disp: 30 tablet, Rfl: 0   polyethylene glycol (MIRALAX / GLYCOLAX) packet, Take 17 g by mouth daily., Disp: , Rfl:    polyethylene glycol-electrolytes (NULYTELY) 420 g solution, Take 4,000 mLs by mouth Nightly., Disp: 4000 mL, Rfl: 0   Semaglutide -Weight Management  (WEGOVY ) 2.4 MG/0.75ML SOAJ, Inject 2.4 mg into the skin once a week for weight loss., Disp: 3 mL, Rfl: 0   sulfamethoxazole -trimethoprim  (BACTRIM  DS) 800-160 MG tablet, Take 1 tablet by mouth 2 (two) times daily as directed. Start the day after surgery., Disp: 10 tablet, Rfl: 0   SUMAtriptan  (IMITREX ) 50 MG tablet, TAKE 1 TABLET BY MOUTH AT ONSET OF HEADACHE, MAY REPEAT 1 DOSE AFTER 2 HOURS IF NEEDED, Disp: 10 tablet, Rfl: 5   SUMAtriptan  (IMITREX ) 50 MG tablet, Take 1 tablet (50 mg total) by mouth at the onset of headache. May repeat 1 dose after 2 hours if needed., Disp: 10 tablet, Rfl: 5   SUMAtriptan  (IMITREX ) 50 MG tablet, Take 1 tablet (50 mg total) by mouth at onset of headache,may repeat 1 dose after 2 hours if needed, 2 to 3 times a week, Disp: 9 tablet, Rfl: 6   SUMAtriptan  (IMITREX ) 50 MG tablet,  Take 1 tablet (50 mg total) by mouth as needed at onset of headache. May repeat 1 dose after 2 hours if needed. Only take 2-3 times a week., Disp: 9 tablet, Rfl: 6   terbinafine  (LAMISIL ) 250 MG tablet, Take 1 tablet (250 mg total) by mouth daily for Tinea, Disp: 14 tablet, Rfl: 0   traZODone  (DESYREL ) 50 MG tablet, TAKE 1 TO 2 TABLETS BY MOUTH AT BEDTIME, Disp: 180 tablet, Rfl: 1   traZODone  (DESYREL ) 50 MG tablet, Take 1-2 tablets (50-100 mg total) by mouth at bedtime., Disp: 180 tablet, Rfl: 1   traZODone  (DESYREL ) 50 MG tablet, Take 1-2 tablets (50-100 mg total) by mouth at bedtime., Disp: 180 tablet, Rfl: 2   WEGOVY  0.5 MG/0.5ML SOAJ, Inject 0.5 mg into the skin once a week for weight loss., Disp: 2 mL, Rfl: 0   WEGOVY  1 MG/0.5ML SOAJ, Inject 1 mg into the skin once a week for weight loss, Disp: 2 mL, Rfl: 0   WEGOVY  2.4 MG/0.75ML SOAJ, Inject 2.4 mg into the skin once a week for weight loss., Disp: 3 mL, Rfl: 0   WEGOVY  2.4 MG/0.75ML SOAJ, Inject 2.4 mg into the skin once a week fro weight loss, Disp: 3 mL, Rfl: 0   WEGOVY  2.4 MG/0.75ML SOAJ, Inject 2.4 mg into the skin once a week for weight  loss, Disp: 3 mL, Rfl: 0   WEGOVY  2.4 MG/0.75ML SOAJ, Inject 2.4 mg,sub-q, into the skin once a week for weight loss, Disp: 3 mL, Rfl: 0   WEGOVY  2.4 MG/0.75ML SOAJ, Inject 2.4 mg into the skin once a week for weight loss., Disp: 3 mL, Rfl: 0   WEGOVY  2.4 MG/0.75ML SOAJ, Inject 2.4 mg into the skin once a week for weight loss., Disp: 3 mL, Rfl: 0   WEGOVY  2.4 MG/0.75ML SOAJ, Inject 2.4 mg into the skin once a week for weight loss, Disp: 3 mL, Rfl: 0 Medication Side Effects: none  Family Medical/ Social History: Changes?  Her mom is having surgery today, for reflux  MENTAL HEALTH EXAM:  There were no vitals taken for this visit.There is no height or weight on file to calculate BMI.  General Appearance: Casual and Well Groomed  Eye Contact:  Good  Speech:  Clear and Coherent and Normal Rate  Volume:  Normal  Mood:  Euthymic  Affect:  Congruent  Thought Process:  Goal Directed and Descriptions of Associations: Circumstantial  Orientation:  Full (Time, Place, and Person)  Thought Content: Logical   Suicidal Thoughts:  No  Homicidal Thoughts:  No  Memory:  WNL  Judgement:  Good  Insight:  Good  Psychomotor Activity:  Normal  Concentration:  Concentration: Good and Attention Span: Good  Recall:  Good  Fund of Knowledge: Good  Language: Good  Assets:  Communication Skills Desire for Improvement Financial Resources/Insurance Housing Physical Health Resilience Social Support Transportation  ADL's:  Intact  Cognition: WNL  Prognosis:  Good   Patient was administered Spravato  84 mg intranasally today.  The patient experienced the typical dissociation which gradually resolved over the 2-hour period of observation.  There were no complications.  Specifically the patient did not have nausea or vomiting or headache.  Blood pressures remained within normal ranges at the 40-minute and 2-hour follow-up intervals.  Pulse ox were nl. By the time the 2-hour observation period was met the  patient was alert and oriented and able to exit without assistance.  Patient feels the Spravato  administration is helpful for the treatment  resistant depression and would like to continue the treatment.  See nursing note for further details.  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 Generalized anxiety disorder  F41.1      Receiving Psychotherapy: No   RECOMMENDATIONS:   PDMP reviewed.  Spravato  filled 12/10/2023. I provided 60 minutes of face to face time during this encounter, including time spent before and after the visit in records review, medical decision making, counseling pertinent to today's visit, and charting.   She is doing well with her current medications so no changes will be made.    Continue all current meds as per med list. Return in 1 week.  Marvia Slocumb, PA-C

## 2023-12-18 ENCOUNTER — Other Ambulatory Visit (HOSPITAL_COMMUNITY): Payer: Self-pay

## 2023-12-20 ENCOUNTER — Other Ambulatory Visit (HOSPITAL_COMMUNITY): Payer: Self-pay

## 2023-12-20 NOTE — Progress Notes (Signed)
 Specialty Pharmacy Refill Coordination Note  Robin Stevenson is a 48 y.o. female contacted today regarding refills of specialty medication(s) Esketamine HCl (Spravato  (84 MG Dose))   Patient requested Courier to Provider Office   Delivery date: 12/24/23   Verified address: [facility] [address], [address], [place], [place]- [place]   Medication will be filled on 12/24/23.  Same day delivery, signature required, ATTN: [name].   Per [name], patient continues to do well on therapy with no issues or concerns.    REMS Authorization information will be added on the date of dispense.

## 2023-12-24 ENCOUNTER — Other Ambulatory Visit: Payer: Self-pay

## 2023-12-24 NOTE — Progress Notes (Signed)
 12/24/23 REMS Authorization Code: [id number]

## 2023-12-25 ENCOUNTER — Ambulatory Visit

## 2023-12-25 ENCOUNTER — Ambulatory Visit: Admitting: Physician Assistant

## 2023-12-25 ENCOUNTER — Encounter: Payer: Self-pay | Admitting: Physician Assistant

## 2023-12-25 VITALS — BP 143/92 | HR 75

## 2023-12-25 DIAGNOSIS — F339 Major depressive disorder, recurrent, unspecified: Secondary | ICD-10-CM

## 2023-12-25 DIAGNOSIS — F411 Generalized anxiety disorder: Secondary | ICD-10-CM | POA: Diagnosis not present

## 2023-12-25 NOTE — Progress Notes (Signed)
 NURSES NOTE:   Pt arrived for her # 35 Spravato  Treatment for treatment resistant depression, the starting dose was 56 mg (2 of the 28 mg nasal sprays) pt stayed at that dose for the first 4 treatments and also takes Zofran  and Meclizine  prior to arrival at office. Pt received 84 mg (3 of the 28 mg nasal sprays) total again today and did also premedicate. [facility] had been advised on her treatments and doses needed. Pt sees Irving Mantle, NP and she will follow her throughout her treatments and follow ups.  Pt's Spravato  is ordered through [facility].  Spravato  medication is stored at treatment center per REMS/FDA guidelines. The medication is required to be locked behind two doors per REMS/FDA protocol. Medication is also disposed of properly after each use per regulations. All documentation for REMS is completed and submitted per FDA/REMS requirements. Pt's prior authorization was submitted and approved for Spravato  84 mg effective 09/24/2023-09/23/2024. Max daily dose is 0.43         Pt directed to treatment room, started vitals at 10:05 AM, 128/79, pulse 71, SpO2 98%. Pt did take Zofran  4 mg and Meclizine  25 mg prior to arriving at office, an hour ahead. Instructed patient to blow her nose if needed then recline back to a 45 degree angle if that was more comfortable for her. Gave patient first dose 28 mg nasal spray, administered in each nostril as directed and observed by nurse, waited 5 more minutes for the second and third doses. Placed a fan in her room. After all doses given pt did not complain of nausea. pt keeps water and some mints to help with the taste of Spravato  it gives the metallic taste. Her 40 minute check was at 10:50 AM, vital signs were 137/91, pulse 79, SpO2 99%. Pt aware she would be monitored for a total time of 120 minutes.  Discharge vitals were taken at 11:55 PM 143/92, P 75, SpO2 99%. Pt met with Marvia Slocumb, PA-C to discuss her  treatment. Pt advised to relax the rest of the day. No driving, no intense activities. Verbalized understanding. Pt reports no issues today. Nurse was with pt a total of 60 minutes for clinical assessment. Pt would like to return, Wednesday,May 14 th. Pt instructed to call office with any problems or questions prior to next treatment.      LOT [id number] EXP JAN [id number]

## 2023-12-25 NOTE — Progress Notes (Signed)
 [facility]  Patient ID: Robin Stevenson,  MRN: [id number]  PCP: Lysle Saunas, MD (Inactive)  Date of Evaluation: 12/25/2023 Time spent:55 minutes  Chief Complaint:  Chief Complaint   Depression; Other    HISTORY/CURRENT STATUS: HPI for Spravato  treatment, she sees Irving Mantle, NP who is out of the office today.  [name] is doing well.  She spent a week with her mom and dad to help out after her mom had surgery last week on her esophagus.  Energy and motivation are good.  No extreme sadness, tearfulness, or feelings of hopelessness.  Sleeps well most of the time. ADLs and personal hygiene are normal.   Denies any changes in concentration, making decisions, or remembering things.  Appetite has not changed.  Weight is stable.  Anxiety is well controlled.  Denies suicidal or homicidal thoughts.  Patient denies increased energy with decreased need for sleep, increased talkativeness, racing thoughts, impulsivity or risky behaviors, increased spending, increased libido, grandiosity, increased irritability or anger, paranoia, or hallucinations.  Review of Systems  Constitutional: Negative.   HENT: Negative.    Eyes: Negative.   Respiratory: Negative.    Cardiovascular: Negative.   Gastrointestinal: Negative.   Genitourinary: Negative.   Musculoskeletal: Negative.   Skin: Negative.   Neurological: Negative.   Endo/Heme/Allergies: Negative.   Psychiatric/Behavioral:         See HPI   Individual Medical History/ Review of Systems: Changes? :No   Past medications for mental health diagnoses include: Spravato , Xanax , Wellbutrin , Vraylar , gabapentin , Fetzima , trazodone   Allergies: Cat dander and Brewers yeast  Current Medications:  Current Outpatient Medications:    ALPRAZolam  (XANAX ) 0.5 MG tablet, Take 1 tablet at bedtime the night before procedure. Take 1 tablet 1 hour before arrival. Bring third tablet with you to procedure., Disp: 3 tablet, Rfl: 0   atorvastatin   (LIPITOR) 20 MG tablet, Take 1 tablet (20 mg total) by mouth daily., Disp: 90 tablet, Rfl: 3   buPROPion  (WELLBUTRIN  XL) 150 MG 24 hr tablet, Take 1 tablet (150 mg total) by mouth every morning., Disp: 90 tablet, Rfl: 0   cariprazine  (VRAYLAR ) 1.5 MG capsule, Take 1 capsule (1.5 mg total) by mouth daily., Disp: 28 capsule, Rfl: 0   celecoxib  (CELEBREX ) 200 MG capsule, Take 1 capsule by mouth single dose as directed. Take 1 hour prior to arrival for surgery, Disp: 1 capsule, Rfl: 0   Esketamine HCl, 84 MG Dose, (SPRAVATO , 84 MG DOSE,) 28 MG/DEVICE SOPK, Dispense X 3 (28 mg) devices for treatments every 7 days, administer intranasally, Disp: 3 each, Rfl: 3   esomeprazole (NEXIUM) 20 MG capsule, Take 20 mg by mouth once., Disp: , Rfl:    gabapentin  (NEURONTIN ) 300 MG capsule, Take 1 capsule (300 mg total) by mouth 3 (three) times daily. Take first dose 1 hour before arrival for surgery., Disp: 21 capsule, Rfl: 0   Levomilnacipran  HCl ER (FETZIMA ) 120 MG CP24, Take 1 capsule by mouth daily., Disp: 90 capsule, Rfl: 3   meclizine  (ANTIVERT ) 25 MG tablet, Take 1 tablet (25 mg total) by mouth as directed prior to treatment., Disp: 60 tablet, Rfl: 0   metFORMIN  (GLUCOPHAGE ) 500 MG tablet, Take 1 tablet (500 mg total) by mouth daily., Disp: 90 tablet, Rfl: 1   ondansetron  (ZOFRAN -ODT) 4 MG disintegrating tablet, Dissolve 1 tablet (4 mg total) by mouth every 8 (eight) hours as needed for nausea or vomiting., Disp: 20 tablet, Rfl: 1   SUMAtriptan  (IMITREX ) 50 MG tablet, Take 1 tablet by  mouth at onset of headache, may repeat same dose once 2 hours later if needed, Disp: 10 tablet, Rfl: 5   traZODone  (DESYREL ) 50 MG tablet, Take 50 mg by mouth at bedtime., Disp: , Rfl:    atovaquone -proguanil (MALARONE ) 250-100 MG TABS tablet, Take 1 tablet by mouth once daily begin 2 days prearrival,take daily during stay & continue for 7 days posttravel for malaria prevention, Disp: 24 tablet, Rfl: 0   azithromycin  (ZITHROMAX )  500 MG tablet, For non bloody diarrhea,take 2 tabs on day 1, if resolved,stop med,If diarrhea persists 1 tab on day 2 & 3. For bloody diarrhea,2 tabs on day 1 & 1 tab on day 2 & 3, Disp: 4 tablet, Rfl: 0   Esketamine HCl, 56 MG Dose, (SPRAVATO , 56 MG DOSE,) 28 MG/DEVICE SOPK, Dispense X 2 (28mg ) devices, administer on day 1 intranasally as directed, Disp: 4 each, Rfl: 0   fluocinonide  ointment (LIDEX ) 0.05 %, Apply as directed to skin twice a day, Disp: 30 g, Rfl: 0   hydrocortisone  2.5 % cream, Apply a small amount to affected area twice daily for 1-2 weeks as directed., Disp: 20 g, Rfl: 0   ketoconazole  (NIZORAL ) 2 % cream, Apply a small amount to affected area on left foot/toe once a day, Disp: 30 g, Rfl: 0   metFORMIN  (GLUCOPHAGE ) 500 MG tablet, Take 1 tablet (500 mg total) by mouth 2 (two) times daily., Disp: 180 tablet, Rfl: 1   metFORMIN  (GLUCOPHAGE ) 500 MG tablet, Take 1 tablet (500 mg total) by mouth 2 (two) times daily., Disp: 90 tablet, Rfl: 1   metFORMIN  (GLUCOPHAGE -XR) 500 MG 24 hr tablet, Take 1 tablet (500 mg total) by mouth daily. Please do not fill if not picked up within 30 days., Disp: 30 tablet, Rfl: 0   methocarbamol  (ROBAXIN ) 500 MG tablet, Take 1 tablet (500 mg total) by mouth every 6 (six) hours as needed for muscle spasms. (Patient not taking: Reported on 10/29/2023), Disp: 45 tablet, Rfl: 0   ondansetron  (ZOFRAN ) 4 MG tablet, Take 1 tablet  by mouth every 6 hours as needed for nausea (Patient not taking: Reported on 11/28/2023), Disp: 15 tablet, Rfl: 0   ondansetron  (ZOFRAN ) 4 MG tablet, Take 1 tablet (4 mg total) by mouth every 6 (six) hours for nausea. TAKE 4 tablets 15 minutes prior to arrival for surgery, Disp: 30 tablet, Rfl: 0   oxyCODONE  (OXY IR/ROXICODONE ) 5 MG immediate release tablet, Take 1-2 tablet by mouth every six to eight hours as needed for Post Op Pain. DO NOT EXCEED  6 tablets in 24 hours., Disp: 30 tablet, Rfl: 0   polyethylene glycol (MIRALAX / GLYCOLAX)  packet, Take 17 g by mouth daily., Disp: , Rfl:    polyethylene glycol-electrolytes (NULYTELY) 420 g solution, Take 4,000 mLs by mouth Nightly., Disp: 4000 mL, Rfl: 0   Semaglutide -Weight Management (WEGOVY ) 2.4 MG/0.75ML SOAJ, Inject 2.4 mg into the skin once a week for weight loss., Disp: 3 mL, Rfl: 0   sulfamethoxazole -trimethoprim  (BACTRIM  DS) 800-160 MG tablet, Take 1 tablet by mouth 2 (two) times daily as directed. Start the day after surgery., Disp: 10 tablet, Rfl: 0   SUMAtriptan  (IMITREX ) 50 MG tablet, TAKE 1 TABLET BY MOUTH AT ONSET OF HEADACHE, MAY REPEAT 1 DOSE AFTER 2 HOURS IF NEEDED, Disp: 10 tablet, Rfl: 5   SUMAtriptan  (IMITREX ) 50 MG tablet, Take 1 tablet (50 mg total) by mouth at the onset of headache. May repeat 1 dose after 2 hours if needed., Disp:  10 tablet, Rfl: 5   SUMAtriptan  (IMITREX ) 50 MG tablet, Take 1 tablet (50 mg total) by mouth at onset of headache,may repeat 1 dose after 2 hours if needed, 2 to 3 times a week, Disp: 9 tablet, Rfl: 6   SUMAtriptan  (IMITREX ) 50 MG tablet, Take 1 tablet (50 mg total) by mouth as needed at onset of headache. May repeat 1 dose after 2 hours if needed. Only take 2-3 times a week., Disp: 9 tablet, Rfl: 6   terbinafine  (LAMISIL ) 250 MG tablet, Take 1 tablet (250 mg total) by mouth daily for Tinea, Disp: 14 tablet, Rfl: 0   traZODone  (DESYREL ) 50 MG tablet, TAKE 1 TO 2 TABLETS BY MOUTH AT BEDTIME, Disp: 180 tablet, Rfl: 1   traZODone  (DESYREL ) 50 MG tablet, Take 1-2 tablets (50-100 mg total) by mouth at bedtime., Disp: 180 tablet, Rfl: 1   traZODone  (DESYREL ) 50 MG tablet, Take 1-2 tablets (50-100 mg total) by mouth at bedtime., Disp: 180 tablet, Rfl: 2   WEGOVY  0.5 MG/0.5ML SOAJ, Inject 0.5 mg into the skin once a week for weight loss., Disp: 2 mL, Rfl: 0   WEGOVY  1 MG/0.5ML SOAJ, Inject 1 mg into the skin once a week for weight loss, Disp: 2 mL, Rfl: 0   WEGOVY  2.4 MG/0.75ML SOAJ, Inject 2.4 mg into the skin once a week for weight loss.,  Disp: 3 mL, Rfl: 0   WEGOVY  2.4 MG/0.75ML SOAJ, Inject 2.4 mg into the skin once a week fro weight loss, Disp: 3 mL, Rfl: 0   WEGOVY  2.4 MG/0.75ML SOAJ, Inject 2.4 mg into the skin once a week for weight loss, Disp: 3 mL, Rfl: 0   WEGOVY  2.4 MG/0.75ML SOAJ, Inject 2.4 mg,sub-q, into the skin once a week for weight loss, Disp: 3 mL, Rfl: 0   WEGOVY  2.4 MG/0.75ML SOAJ, Inject 2.4 mg into the skin once a week for weight loss., Disp: 3 mL, Rfl: 0   WEGOVY  2.4 MG/0.75ML SOAJ, Inject 2.4 mg into the skin once a week for weight loss., Disp: 3 mL, Rfl: 0   WEGOVY  2.4 MG/0.75ML SOAJ, Inject 2.4 mg into the skin once a week for weight loss, Disp: 3 mL, Rfl: 0 Medication Side Effects: none  Family Medical/ Social History: Changes?     MENTAL HEALTH EXAM:  There were no vitals taken for this visit.There is no height or weight on file to calculate BMI.  General Appearance: Casual and Well Groomed  Eye Contact:  Good  Speech:  Clear and Coherent and Normal Rate  Volume:  Normal  Mood:  Euthymic  Affect:  Congruent  Thought Process:  Goal Directed and Descriptions of Associations: Circumstantial  Orientation:  Full (Time, Place, and Person)  Thought Content: Logical   Suicidal Thoughts:  No  Homicidal Thoughts:  No  Memory:  WNL  Judgement:  Good  Insight:  Good  Psychomotor Activity:  Normal  Concentration:  Concentration: Good and Attention Span: Good  Recall:  Good  Fund of Knowledge: Good  Language: Good  Assets:  Communication Skills Desire for Improvement Financial Resources/Insurance Housing Leisure Time Physical Health Resilience Social Support Transportation  ADL's:  Intact  Cognition: WNL  Prognosis:  Good   Patient was administered Spravato  84 mg intranasally today.  The patient experienced the typical dissociation which gradually resolved over the 2-hour period of observation.  There were no complications.  Specifically the patient did not have nausea or vomiting or  headache.  Blood pressures remained within normal  ranges at the 40-minute and 2-hour follow-up intervals.  Pulse ox were nl. By the time the 2-hour observation period was met the patient was alert and oriented and able to exit without assistance.  Patient feels the Spravato  administration is helpful for the treatment resistant depression and would like to continue the treatment.  See nursing note for further details.  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 Generalized anxiety disorder  F41.1       Receiving Psychotherapy: No   RECOMMENDATIONS:   PDMP reviewed.  Spravato  filled 12/24/2023. I provided 55 minutes of face to face time during this encounter, including time spent before and after the visit in records review, medical decision making, counseling pertinent to today's visit, and charting.   [name] is doing well and continues to respond to the Spravato  treatments.  No changes are necessary.  Continue Xanax  0.5 mg, daily as needed. Continue Wellbutrin  XL 150 mg daily. Continue Vraylar  1.5 mg, 1 p.o. daily. Continue Spravato  weekly. Continue Fetzima  120 mg, 1 p.o. daily. Continue trazodone  50 mg, 1-2 nightly as needed sleep. Return in 1 week.  Marvia Slocumb, PA-C

## 2023-12-27 ENCOUNTER — Other Ambulatory Visit: Payer: Self-pay

## 2023-12-27 ENCOUNTER — Other Ambulatory Visit (HOSPITAL_COMMUNITY): Payer: Self-pay

## 2023-12-27 MED ORDER — SPRAVATO (84 MG DOSE) 28 MG/DEVICE NA SOPK
PACK | NASAL | 3 refills | Status: AC
Start: 2023-12-27 — End: ?
  Filled 2023-12-27: qty 3, 7d supply, fill #0

## 2023-12-27 NOTE — Progress Notes (Signed)
 Specialty Pharmacy Refill Coordination Note  Robin Stevenson is a 48 y.o. female contacted today regarding refills of specialty medication(s) Esketamine HCl (Spravato  (84 MG Dose))   Patient requested Courier to Provider Office   Delivery date: 12/31/23   Verified address: [facility] [address], [place], [place]- [place] [facility]   Medication will be filled on 12/31/23.    Same day courier, signature required, Attn: [name]. REMS authorization to be added on date dispensed.

## 2023-12-31 ENCOUNTER — Other Ambulatory Visit: Payer: Self-pay

## 2023-12-31 NOTE — Progress Notes (Signed)
 12/31/23 REMS Authorization Code: [id number]

## 2024-01-01 ENCOUNTER — Other Ambulatory Visit: Payer: Self-pay

## 2024-01-02 ENCOUNTER — Ambulatory Visit: Admitting: Adult Health

## 2024-01-02 ENCOUNTER — Encounter: Payer: Self-pay | Admitting: Adult Health

## 2024-01-02 ENCOUNTER — Ambulatory Visit

## 2024-01-02 VITALS — BP 132/84 | HR 69

## 2024-01-02 DIAGNOSIS — F339 Major depressive disorder, recurrent, unspecified: Secondary | ICD-10-CM | POA: Diagnosis not present

## 2024-01-02 NOTE — Progress Notes (Signed)
 Robin Stevenson [id number] 1975/11/18 48 y.o.  Subjective:   Patient ID:  Robin Stevenson is a 48 y.o. (DOB 21-Apr-1976) female.  Chief Complaint: No chief complaint on file.   HPI Robin Stevenson presents to the office today for follow-up of  treatment resistant depression (TRD).  Spravato  treatment    Patient was administered Spravato  84 mg intranasally today. Patient was observed by provider throughout Spravato  treatment. The patient experienced the typical dissociation which gradually resolved over the 2-hour period of observation. She did not feel sleepy, decreased feeling of sensitivity (numbness), lack of energy, increased blood pressure, feeling happy or very excited, or headache. She denied feeling disconnected from themself, their thoughts, feelings and things around them. Blood pressures remained within normal ranges at the 40-minute and 2-hour follow-up intervals. By the time the 2-hour observation period was met the patient was alert and oriented and able to exit without assistance. 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 See nursing note for further details.  [name] continues to do well with current treatment regimen.  Reports mood as stable. Denies depression. She reports stable interest and motivation. She denies feelings of sadness. Feels hopeful about the future. She reports completing ADLs and personal hygiene. She reports stable focus and concentration. Reports completing tasks. Appetite is adquate and her weight is stable. She reports sleeping well most nights. Denies suicidal or homicidal thoughts.   Review of Systems:  Review of Systems  Musculoskeletal:  Negative for gait problem.  Neurological:  Negative for tremors.  Psychiatric/Behavioral:         Please refer to HPI    Medications: I have reviewed the patient's current medications.  Current Outpatient Medications  Medication Sig Dispense Refill    ALPRAZolam  (XANAX ) 0.5 MG tablet Take 1 tablet at bedtime the night before procedure. Take 1 tablet 1 hour before arrival. Bring third tablet with you to procedure. 3 tablet 0   atorvastatin  (LIPITOR) 20 MG tablet Take 1 tablet (20 mg total) by mouth daily. 90 tablet 3   atovaquone -proguanil (MALARONE ) 250-100 MG TABS tablet Take 1 tablet by mouth once daily begin 2 days prearrival,take daily during stay & continue for 7 days posttravel for malaria prevention 24 tablet 0   azithromycin  (ZITHROMAX ) 500 MG tablet For non bloody diarrhea,take 2 tabs on day 1, if resolved,stop med,If diarrhea persists 1 tab on day 2 & 3. For bloody diarrhea,2 tabs on day 1 & 1 tab on day 2 & 3 4 tablet 0   buPROPion  (WELLBUTRIN  XL) 150 MG 24 hr tablet Take 1 tablet (150 mg total) by mouth every morning. 90 tablet 0   cariprazine  (VRAYLAR ) 1.5 MG capsule Take 1 capsule (1.5 mg total) by mouth daily. 28 capsule 0   celecoxib  (CELEBREX ) 200 MG capsule Take 1 capsule by mouth single dose as directed. Take 1 hour prior to arrival for surgery 1 capsule 0   Esketamine HCl, 56 MG Dose, (SPRAVATO , 56 MG DOSE,) 28 MG/DEVICE SOPK Dispense X 2 (28mg ) devices, administer on day 1 intranasally as directed 4 each 0   Esketamine HCl, 84 MG Dose, (SPRAVATO , 84 MG DOSE,) 28 MG/DEVICE SOPK Dispense X 3 (28 mg) devices for treatments every 7 days, administer intranasally 3 each 3   esomeprazole (NEXIUM) 20 MG capsule Take 20 mg by mouth once.     fluocinonide  ointment (LIDEX ) 0.05 % Apply as directed to skin twice a day 30 g 0   gabapentin  (NEURONTIN ) 300 MG  capsule Take 1 capsule (300 mg total) by mouth 3 (three) times daily. Take first dose 1 hour before arrival for surgery. 21 capsule 0   hydrocortisone  2.5 % cream Apply a small amount to affected area twice daily for 1-2 weeks as directed. 20 g 0   ketoconazole  (NIZORAL ) 2 % cream Apply a small amount to affected area on left foot/toe once a day 30 g 0   Levomilnacipran  HCl ER  (FETZIMA ) 120 MG CP24 Take 1 capsule by mouth daily. 90 capsule 3   meclizine  (ANTIVERT ) 25 MG tablet Take 1 tablet (25 mg total) by mouth as directed prior to treatment. 60 tablet 0   metFORMIN  (GLUCOPHAGE ) 500 MG tablet Take 1 tablet (500 mg total) by mouth daily. 90 tablet 1   metFORMIN  (GLUCOPHAGE ) 500 MG tablet Take 1 tablet (500 mg total) by mouth 2 (two) times daily. 180 tablet 1   metFORMIN  (GLUCOPHAGE ) 500 MG tablet Take 1 tablet (500 mg total) by mouth 2 (two) times daily. 90 tablet 1   metFORMIN  (GLUCOPHAGE -XR) 500 MG 24 hr tablet Take 1 tablet (500 mg total) by mouth daily. Please do not fill if not picked up within 30 days. 30 tablet 0   methocarbamol  (ROBAXIN ) 500 MG tablet Take 1 tablet (500 mg total) by mouth every 6 (six) hours as needed for muscle spasms. (Patient not taking: Reported on 10/29/2023) 45 tablet 0   ondansetron  (ZOFRAN ) 4 MG tablet Take 1 tablet  by mouth every 6 hours as needed for nausea (Patient not taking: Reported on 11/28/2023) 15 tablet 0   ondansetron  (ZOFRAN ) 4 MG tablet Take 1 tablet (4 mg total) by mouth every 6 (six) hours for nausea. TAKE 4 tablets 15 minutes prior to arrival for surgery 30 tablet 0   ondansetron  (ZOFRAN -ODT) 4 MG disintegrating tablet Dissolve 1 tablet (4 mg total) by mouth every 8 (eight) hours as needed for nausea or vomiting. 20 tablet 1   oxyCODONE  (OXY IR/ROXICODONE ) 5 MG immediate release tablet Take 1-2 tablet by mouth every six to eight hours as needed for Post Op Pain. DO NOT EXCEED  6 tablets in 24 hours. 30 tablet 0   polyethylene glycol (MIRALAX / GLYCOLAX) packet Take 17 g by mouth daily.     polyethylene glycol-electrolytes (NULYTELY) 420 g solution Take 4,000 mLs by mouth Nightly. 4000 mL 0   Semaglutide -Weight Management (WEGOVY ) 2.4 MG/0.75ML SOAJ Inject 2.4 mg into the skin once a week for weight loss. 3 mL 0   sulfamethoxazole -trimethoprim  (BACTRIM  DS) 800-160 MG tablet Take 1 tablet by mouth 2 (two) times daily as directed.  Start the day after surgery. 10 tablet 0   SUMAtriptan  (IMITREX ) 50 MG tablet TAKE 1 TABLET BY MOUTH AT ONSET OF HEADACHE, MAY REPEAT 1 DOSE AFTER 2 HOURS IF NEEDED 10 tablet 5   SUMAtriptan  (IMITREX ) 50 MG tablet Take 1 tablet by mouth at onset of headache, may repeat same dose once 2 hours later if needed 10 tablet 5   SUMAtriptan  (IMITREX ) 50 MG tablet Take 1 tablet (50 mg total) by mouth at the onset of headache. May repeat 1 dose after 2 hours if needed. 10 tablet 5   SUMAtriptan  (IMITREX ) 50 MG tablet Take 1 tablet (50 mg total) by mouth at onset of headache,may repeat 1 dose after 2 hours if needed, 2 to 3 times a week 9 tablet 6   SUMAtriptan  (IMITREX ) 50 MG tablet Take 1 tablet (50 mg total) by mouth as needed at onset of  headache. May repeat 1 dose after 2 hours if needed. Only take 2-3 times a week. 9 tablet 6   terbinafine  (LAMISIL ) 250 MG tablet Take 1 tablet (250 mg total) by mouth daily for Tinea 14 tablet 0   traZODone  (DESYREL ) 50 MG tablet Take 50 mg by mouth at bedtime.     traZODone  (DESYREL ) 50 MG tablet TAKE 1 TO 2 TABLETS BY MOUTH AT BEDTIME 180 tablet 1   traZODone  (DESYREL ) 50 MG tablet Take 1-2 tablets (50-100 mg total) by mouth at bedtime. 180 tablet 1   traZODone  (DESYREL ) 50 MG tablet Take 1-2 tablets (50-100 mg total) by mouth at bedtime. 180 tablet 2   WEGOVY  0.5 MG/0.5ML SOAJ Inject 0.5 mg into the skin once a week for weight loss. 2 mL 0   WEGOVY  1 MG/0.5ML SOAJ Inject 1 mg into the skin once a week for weight loss 2 mL 0   WEGOVY  2.4 MG/0.75ML SOAJ Inject 2.4 mg into the skin once a week for weight loss. 3 mL 0   WEGOVY  2.4 MG/0.75ML SOAJ Inject 2.4 mg into the skin once a week fro weight loss 3 mL 0   WEGOVY  2.4 MG/0.75ML SOAJ Inject 2.4 mg into the skin once a week for weight loss 3 mL 0   WEGOVY  2.4 MG/0.75ML SOAJ Inject 2.4 mg,sub-q, into the skin once a week for weight loss 3 mL 0   WEGOVY  2.4 MG/0.75ML SOAJ Inject 2.4 mg into the skin once a week for weight  loss. 3 mL 0   WEGOVY  2.4 MG/0.75ML SOAJ Inject 2.4 mg into the skin once a week for weight loss. 3 mL 0   WEGOVY  2.4 MG/0.75ML SOAJ Inject 2.4 mg into the skin once a week for weight loss 3 mL 0   No current facility-administered medications for this visit.    Medication Side Effects: None  Allergies:  Allergies  Allergen Reactions   Cat Dander Anaphylaxis   Brewers Yeast Rash    Past Medical History:  Diagnosis Date   Depression    takes Welbutrin and Fetzima    GERD (gastroesophageal reflux disease)    takes nexium    Past Medical History, Surgical history, Social history, and Family history were reviewed and updated as appropriate.   Please see review of systems for further details on the patient's review from today.   Objective:   Physical Exam:  There were no vitals taken for this visit.  Physical Exam Constitutional:      General: She is not in acute distress. Musculoskeletal:        General: No deformity.  Neurological:     Mental Status: She is alert and oriented to person, place, and time.     Coordination: Coordination normal.  Psychiatric:        Attention and Perception: Attention and perception normal. She does not perceive auditory or visual hallucinations.        Mood and Affect: Mood normal. Mood is not anxious or depressed. Affect is not labile, blunt, angry or inappropriate.        Speech: Speech normal.        Behavior: Behavior normal.        Thought Content: Thought content normal. Thought content is not paranoid or delusional. Thought content does not include homicidal or suicidal ideation. Thought content does not include homicidal or suicidal plan.        Cognition and Memory: Cognition and memory normal.        Judgment:  Judgment normal.     Comments: Insight intact     Lab Review:     Component Value Date/Time   NA 140 09/01/2016 0852   K 4.4 09/01/2016 0852   CL 104 09/01/2016 0852   CO2 31 09/01/2016 0852   GLUCOSE 90 09/01/2016  0852   BUN 14 09/01/2016 0852   CREATININE 0.86 09/01/2016 0852   CALCIUM  9.2 09/01/2016 0852   PROT 7.0 09/01/2016 0852   ALBUMIN 4.3 09/01/2016 0852   AST 33 09/01/2016 0852   ALT 70 (H) 09/01/2016 0852   ALKPHOS 66 09/01/2016 0852   BILITOT 0.7 09/01/2016 0852       Component Value Date/Time   WBC 5.1 09/01/2016 0852   RBC 4.30 09/01/2016 0852   HGB 13.5 09/01/2016 0852   HCT 38.9 09/01/2016 0852   PLT 286.0 09/01/2016 0852   MCV 90.4 09/01/2016 0852   MCHC 34.6 09/01/2016 0852   RDW 11.9 09/01/2016 0852   LYMPHSABS 1.5 09/01/2016 0852   MONOABS 0.4 09/01/2016 0852   EOSABS 0.1 09/01/2016 0852   BASOSABS 0.0 09/01/2016 0852    No results found for: "POCLITH", "LITHIUM"   No results found for: "PHENYTOIN", "PHENOBARB", "VALPROATE", "CBMZ"   .res Assessment: Plan:    PDMP reviewed.  Spravato  filled 01/01/2024.  I provided 60 minutes of face to face time during this encounter, including time spent before and after the visit in records review, medical decision making, counseling pertinent to today's visit, and charting.   [name] is doing well and continues to respond to Spravato  treatments.    Continue Xanax  0.5 mg, daily as needed. Continue Wellbutrin  XL 150 mg daily. Continue Vraylar  1.5 mg, 1 p.o. daily. Continue Spravato  weekly. Continue Fetzima  120 mg, 1 p.o. daily. Continue trazodone  50 mg, 1-2 nightly as needed sleep. Return in 1 week.   Diagnoses and all orders for this visit:  Recurrent major depression resistant to treatment [facility])     Please see After Visit Summary for patient specific instructions.  Future Appointments  Date Time Provider Department Center  01/08/2024 10:00 AM Verneda Golder, PA-C [facility] None  01/08/2024 10:00 AM CP-NURSE [facility] None  04/15/2024 10:20 AM [facility]    No orders of the defined types were placed in this encounter.   -------------------------------

## 2024-01-02 NOTE — Progress Notes (Signed)
 NURSES NOTE:   Pt arrived for her # 36 Spravato  Treatment for treatment resistant depression, the starting dose was 56 mg (2 of the 28 mg nasal sprays) pt stayed at that dose for the first 4 treatments and also takes Zofran  and Meclizine  prior to arrival at office. Pt received 84 mg (3 of the 28 mg nasal sprays) total again today and did also premedicate. [facility] had been advised on her treatments and doses needed. Pt sees Irving Mantle, NP and she will follow her throughout her treatments and follow ups.  Pt's Spravato  is ordered through [facility].  Spravato  medication is stored at treatment center per REMS/FDA guidelines. The medication is required to be locked behind two doors per REMS/FDA protocol. Medication is also disposed of properly after each use per regulations. All documentation for REMS is completed and submitted per FDA/REMS requirements. Pt's prior authorization was submitted and approved for Spravato  84 mg effective 09/24/2023-09/23/2024. Max daily dose is 0.43         Pt directed to treatment room, started vitals at 10:12 AM, 148/92, pulse 72, SpO2 99%. Pt did take Zofran  4 mg and Meclizine  25 mg prior to arriving at office, an hour ahead. Instructed patient to blow her nose if needed then recline back to a 45 degree angle if that was more comfortable for her. Gave patient first dose 28 mg nasal spray, administered in each nostril as directed and observed by nurse, waited 5 more minutes for the second and third doses. Placed a fan in her room. After all doses given pt did not complain of nausea. pt keeps water and some mints to help with the taste of Spravato  it gives the metallic taste. Her 40 minute check was at 11:00 AM, vital signs were 142/93, pulse 72, SpO2 99%. Pt aware she would be monitored for a total time of 120 minutes.  Discharge vitals were taken at 11:58 AM 132/84, P 69, SpO2 99%. Pt met with Leward Record to discuss her treatment. Pt  advised to relax the rest of the day. No driving, no intense activities. Verbalized understanding. Pt reports no issues today. Nurse was with pt a total of 60 minutes for clinical assessment. Pt would like to return, Tuesday, May 20th. Pt instructed to call office with any problems or questions prior to next treatment.      LOT [id number] EXP JAN 2028

## 2024-01-03 ENCOUNTER — Other Ambulatory Visit: Payer: Self-pay

## 2024-01-03 ENCOUNTER — Other Ambulatory Visit (HOSPITAL_COMMUNITY): Payer: Self-pay

## 2024-01-03 NOTE — Progress Notes (Signed)
 The MD's office has decided to provide Spravato  via buy and bill.  They will contact us  if this changes in the future.  Disenrolling from Specialty Pharmacy services at this time.

## 2024-01-08 ENCOUNTER — Ambulatory Visit (INDEPENDENT_AMBULATORY_CARE_PROVIDER_SITE_OTHER): Admitting: Physician Assistant

## 2024-01-08 ENCOUNTER — Ambulatory Visit

## 2024-01-08 ENCOUNTER — Other Ambulatory Visit (HOSPITAL_COMMUNITY): Payer: Self-pay

## 2024-01-08 ENCOUNTER — Encounter: Payer: Self-pay | Admitting: Physician Assistant

## 2024-01-08 VITALS — BP 143/91 | HR 67

## 2024-01-08 DIAGNOSIS — F339 Major depressive disorder, recurrent, unspecified: Secondary | ICD-10-CM | POA: Diagnosis not present

## 2024-01-08 DIAGNOSIS — F418 Other specified anxiety disorders: Secondary | ICD-10-CM

## 2024-01-08 DIAGNOSIS — F411 Generalized anxiety disorder: Secondary | ICD-10-CM

## 2024-01-08 MED ORDER — ONDANSETRON 4 MG PO TBDP
4.0000 mg | ORAL_TABLET | Freq: Three times a day (TID) | ORAL | 1 refills | Status: AC | PRN
  Filled 2024-01-08: qty 20, 7d supply, fill #0

## 2024-01-08 NOTE — Progress Notes (Signed)
 NURSES NOTE:   Pt arrived for her # 37 Spravato  Treatment for treatment resistant depression, the starting dose was 56 mg (2 of the 28 mg nasal sprays) pt stayed at that dose for the first 4 treatments and also takes Zofran  and Meclizine  prior to arrival at office. Pt received 84 mg (3 of the 28 mg nasal sprays) total again today and did also premedicate. Pt is now using her medical benefits and receives Spravato  through [facility], today is the first time using it. Pt sees Irving Mantle, NP and she will follow her throughout her treatments and follow ups. Spravato  medication is stored at treatment center per REMS/FDA guidelines. The medication is required to be locked behind two doors per REMS/FDA protocol. Medication is also disposed of properly after each use per regulations. All documentation for REMS is completed and submitted per FDA/REMS requirements.          Pt directed to treatment room, started vitals at 10:10 AM, 141/87, pulse 74, SpO2 98%. Pt did take Zofran  4 mg and Meclizine  25 mg prior to arriving at office, an hour ahead. Instructed patient to blow her nose if needed then recline back to a 45 degree angle if that was more comfortable for her. Gave patient first dose 28 mg nasal spray, administered in each nostril as directed and observed by nurse, waited 5 more minutes for the second and third doses. Placed a fan in her room. After all doses given pt did not complain of nausea. pt keeps water and some mints to help with the taste of Spravato  it gives the metallic taste. Her 40 minute check was at 11:00 AM, vital signs were 142/93, pulse 72, SpO2 99%. Pt aware she would be monitored for a total time of 120 minutes.  Discharge vitals were taken at 12:03 PM 143/91, P 67, SpO2 99%. Pt met with Marvia Slocumb, PA-C to discuss her treatment. Pt advised to relax the rest of the day. No driving, no intense activities. Verbalized understanding. Pt reports no issues today. Nurse was with pt a total of  60 minutes for clinical assessment. Pt would like to return, Wednesday, May 28th. Pt instructed to call office with any problems or questions prior to next treatment.      LOT [id number] EXP FEB 2028

## 2024-01-08 NOTE — Progress Notes (Signed)
 [facility]  Patient ID: Robin Stevenson,  MRN: [id number]  PCP: Lysle Saunas, MD (Inactive)  Date of Evaluation: 01/08/2024 Time spent:60 minutes  Chief Complaint:  Chief Complaint   Depression; Other    HISTORY/CURRENT STATUS: HPI for Spravato  treatment, she sees Irving Mantle, NP who is out of the office today.  Continues to do well with Spravato  and all other meds. Energy and motivation are good.  No extreme sadness, tearfulness, or feelings of hopelessness.  Sleeps ok.  ADLs and personal hygiene are normal.   Denies any changes in concentration, making decisions, or remembering things.  Appetite has not changed.  Weight is stable.  Anxiety is controlled.  Denies suicidal or homicidal thoughts.  Patient denies increased energy with decreased need for sleep, increased talkativeness, racing thoughts, impulsivity or risky behaviors, increased spending, increased libido, grandiosity, increased irritability or anger, paranoia, or hallucinations.  Denies dizziness, syncope, seizures, numbness, tingling, tremor, tics, unsteady gait, slurred speech, confusion. Denies muscle or joint pain, stiffness, or dystonia. Denies unexplained weight loss, frequent infections, or sores that heal slowly.  No polyphagia, polydipsia, or polyuria. Denies visual changes or paresthesias.   Individual Medical History/ Review of Systems: Changes? :No   Past medications for mental health diagnoses include: Spravato , Xanax , Wellbutrin , Vraylar , gabapentin , Fetzima , trazodone   Allergies: Cat dander and Brewers yeast  Current Medications:  Current Outpatient Medications:    ALPRAZolam  (XANAX ) 0.5 MG tablet, Take 1 tablet at bedtime the night before procedure. Take 1 tablet 1 hour before arrival. Bring third tablet with you to procedure., Disp: 3 tablet, Rfl: 0   atorvastatin  (LIPITOR) 20 MG tablet, Take 1 tablet (20 mg total) by mouth daily., Disp: 90 tablet, Rfl: 3   buPROPion  (WELLBUTRIN  XL)  150 MG 24 hr tablet, Take 1 tablet (150 mg total) by mouth every morning., Disp: 90 tablet, Rfl: 0   cariprazine  (VRAYLAR ) 1.5 MG capsule, Take 1 capsule (1.5 mg total) by mouth daily., Disp: 28 capsule, Rfl: 0   celecoxib  (CELEBREX ) 200 MG capsule, Take 1 capsule by mouth single dose as directed. Take 1 hour prior to arrival for surgery, Disp: 1 capsule, Rfl: 0   Esketamine HCl, 84 MG Dose, (SPRAVATO , 84 MG DOSE,) 28 MG/DEVICE SOPK, Dispense X 3 (28 mg) devices for treatments every 7 days, administer intranasally, Disp: 3 each, Rfl: 3   esomeprazole (NEXIUM) 20 MG capsule, Take 20 mg by mouth once., Disp: , Rfl:    gabapentin  (NEURONTIN ) 300 MG capsule, Take 1 capsule (300 mg total) by mouth 3 (three) times daily. Take first dose 1 hour before arrival for surgery., Disp: 21 capsule, Rfl: 0   Levomilnacipran  HCl ER (FETZIMA ) 120 MG CP24, Take 1 capsule by mouth daily., Disp: 90 capsule, Rfl: 3   SUMAtriptan  (IMITREX ) 50 MG tablet, Take 1 tablet (50 mg total) by mouth as needed at onset of headache. May repeat 1 dose after 2 hours if needed. Only take 2-3 times a week., Disp: 9 tablet, Rfl: 6   traZODone  (DESYREL ) 50 MG tablet, Take 1-2 tablets (50-100 mg total) by mouth at bedtime., Disp: 180 tablet, Rfl: 1   atovaquone -proguanil (MALARONE ) 250-100 MG TABS tablet, Take 1 tablet by mouth once daily begin 2 days prearrival,take daily during stay & continue for 7 days posttravel for malaria prevention, Disp: 24 tablet, Rfl: 0   azithromycin  (ZITHROMAX ) 500 MG tablet, For non bloody diarrhea,take 2 tabs on day 1, if resolved,stop med,If diarrhea persists 1 tab on day 2 & 3. For  bloody diarrhea,2 tabs on day 1 & 1 tab on day 2 & 3, Disp: 4 tablet, Rfl: 0   Esketamine HCl, 56 MG Dose, (SPRAVATO , 56 MG DOSE,) 28 MG/DEVICE SOPK, Dispense X 2 (28mg ) devices, administer on day 1 intranasally as directed, Disp: 4 each, Rfl: 0   fluocinonide  ointment (LIDEX ) 0.05 %, Apply as directed to skin twice a day, Disp: 30 g,  Rfl: 0   hydrocortisone  2.5 % cream, Apply a small amount to affected area twice daily for 1-2 weeks as directed., Disp: 20 g, Rfl: 0   ketoconazole  (NIZORAL ) 2 % cream, Apply a small amount to affected area on left foot/toe once a day, Disp: 30 g, Rfl: 0   meclizine  (ANTIVERT ) 25 MG tablet, Take 1 tablet (25 mg total) by mouth as directed prior to treatment., Disp: 60 tablet, Rfl: 0   metFORMIN  (GLUCOPHAGE ) 500 MG tablet, Take 1 tablet (500 mg total) by mouth daily., Disp: 90 tablet, Rfl: 1   metFORMIN  (GLUCOPHAGE ) 500 MG tablet, Take 1 tablet (500 mg total) by mouth 2 (two) times daily., Disp: 180 tablet, Rfl: 1   metFORMIN  (GLUCOPHAGE ) 500 MG tablet, Take 1 tablet (500 mg total) by mouth 2 (two) times daily., Disp: 90 tablet, Rfl: 1   metFORMIN  (GLUCOPHAGE -XR) 500 MG 24 hr tablet, Take 1 tablet (500 mg total) by mouth daily. Please do not fill if not picked up within 30 days., Disp: 30 tablet, Rfl: 0   methocarbamol  (ROBAXIN ) 500 MG tablet, Take 1 tablet (500 mg total) by mouth every 6 (six) hours as needed for muscle spasms. (Patient not taking: Reported on 10/29/2023), Disp: 45 tablet, Rfl: 0   ondansetron  (ZOFRAN ) 4 MG tablet, Take 1 tablet  by mouth every 6 hours as needed for nausea, Disp: 15 tablet, Rfl: 0   ondansetron  (ZOFRAN ) 4 MG tablet, Take 1 tablet (4 mg total) by mouth every 6 (six) hours for nausea. TAKE 4 tablets 15 minutes prior to arrival for surgery, Disp: 30 tablet, Rfl: 0   ondansetron  (ZOFRAN -ODT) 4 MG disintegrating tablet, Dissolve 1 tablet (4 mg total) by mouth every 8 (eight) hours as needed for nausea or vomiting., Disp: 20 tablet, Rfl: 1   oxyCODONE  (OXY IR/ROXICODONE ) 5 MG immediate release tablet, Take 1-2 tablet by mouth every six to eight hours as needed for Post Op Pain. DO NOT EXCEED  6 tablets in 24 hours., Disp: 30 tablet, Rfl: 0   polyethylene glycol (MIRALAX / GLYCOLAX) packet, Take 17 g by mouth daily., Disp: , Rfl:    polyethylene glycol-electrolytes (NULYTELY)  420 g solution, Take 4,000 mLs by mouth Nightly., Disp: 4000 mL, Rfl: 0   Semaglutide -Weight Management (WEGOVY ) 2.4 MG/0.75ML SOAJ, Inject 2.4 mg into the skin once a week for weight loss., Disp: 3 mL, Rfl: 0   sulfamethoxazole -trimethoprim  (BACTRIM  DS) 800-160 MG tablet, Take 1 tablet by mouth 2 (two) times daily as directed. Start the day after surgery., Disp: 10 tablet, Rfl: 0   SUMAtriptan  (IMITREX ) 50 MG tablet, TAKE 1 TABLET BY MOUTH AT ONSET OF HEADACHE, MAY REPEAT 1 DOSE AFTER 2 HOURS IF NEEDED, Disp: 10 tablet, Rfl: 5   SUMAtriptan  (IMITREX ) 50 MG tablet, Take 1 tablet by mouth at onset of headache, may repeat same dose once 2 hours later if needed, Disp: 10 tablet, Rfl: 5   SUMAtriptan  (IMITREX ) 50 MG tablet, Take 1 tablet (50 mg total) by mouth at the onset of headache. May repeat 1 dose after 2 hours if needed., Disp: 10  tablet, Rfl: 5   SUMAtriptan  (IMITREX ) 50 MG tablet, Take 1 tablet (50 mg total) by mouth at onset of headache,may repeat 1 dose after 2 hours if needed, 2 to 3 times a week, Disp: 9 tablet, Rfl: 6   terbinafine  (LAMISIL ) 250 MG tablet, Take 1 tablet (250 mg total) by mouth daily for Tinea, Disp: 14 tablet, Rfl: 0   traZODone  (DESYREL ) 50 MG tablet, Take 50 mg by mouth at bedtime., Disp: , Rfl:    traZODone  (DESYREL ) 50 MG tablet, TAKE 1 TO 2 TABLETS BY MOUTH AT BEDTIME, Disp: 180 tablet, Rfl: 1   traZODone  (DESYREL ) 50 MG tablet, Take 1-2 tablets (50-100 mg total) by mouth at bedtime., Disp: 180 tablet, Rfl: 2   WEGOVY  0.5 MG/0.5ML SOAJ, Inject 0.5 mg into the skin once a week for weight loss., Disp: 2 mL, Rfl: 0   WEGOVY  1 MG/0.5ML SOAJ, Inject 1 mg into the skin once a week for weight loss, Disp: 2 mL, Rfl: 0   WEGOVY  2.4 MG/0.75ML SOAJ, Inject 2.4 mg into the skin once a week for weight loss., Disp: 3 mL, Rfl: 0   WEGOVY  2.4 MG/0.75ML SOAJ, Inject 2.4 mg into the skin once a week fro weight loss, Disp: 3 mL, Rfl: 0   WEGOVY  2.4 MG/0.75ML SOAJ, Inject 2.4 mg into the  skin once a week for weight loss, Disp: 3 mL, Rfl: 0   WEGOVY  2.4 MG/0.75ML SOAJ, Inject 2.4 mg,sub-q, into the skin once a week for weight loss, Disp: 3 mL, Rfl: 0   WEGOVY  2.4 MG/0.75ML SOAJ, Inject 2.4 mg into the skin once a week for weight loss., Disp: 3 mL, Rfl: 0   WEGOVY  2.4 MG/0.75ML SOAJ, Inject 2.4 mg into the skin once a week for weight loss., Disp: 3 mL, Rfl: 0   WEGOVY  2.4 MG/0.75ML SOAJ, Inject 2.4 mg into the skin once a week for weight loss, Disp: 3 mL, Rfl: 0 Medication Side Effects: none  Family Medical/ Social History: Changes?     MENTAL HEALTH EXAM:  There were no vitals taken for this visit.There is no height or weight on file to calculate BMI.  General Appearance: Casual and Well Groomed  Eye Contact:  Good  Speech:  Clear and Coherent and Normal Rate  Volume:  Normal  Mood:  Euthymic  Affect:  Congruent  Thought Process:  Goal Directed and Descriptions of Associations: Circumstantial  Orientation:  Full (Time, Place, and Person)  Thought Content: Logical   Suicidal Thoughts:  No  Homicidal Thoughts:  No  Memory:  WNL  Judgement:  Good  Insight:  Good  Psychomotor Activity:  Normal  Concentration:  Concentration: Good and Attention Span: Good  Recall:  Good  Fund of Knowledge: Good  Language: Good  Assets:  Communication Skills Desire for Improvement Financial Resources/Insurance Housing Leisure Time Resilience Social Support Transportation  ADL's:  Intact  Cognition: WNL  Prognosis:  Good   Patient was administered Spravato  84 mg intranasally today.  The patient experienced the typical dissociation which gradually resolved over the 2-hour period of observation.  There were no complications.  Specifically the patient did not have nausea or vomiting or headache.  Blood pressures remained within normal ranges at the 40-minute and 2-hour follow-up intervals.  Pulse ox were nl. By the time the 2-hour observation period was met the patient was alert and  oriented and able to exit without assistance.  Patient feels the Spravato  administration is helpful for the treatment resistant depression and  would like to continue the treatment.  See nursing note for further details.  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 Generalized anxiety disorder  F41.1     3. Situational anxiety  F41.8       Receiving Psychotherapy: No   RECOMMENDATIONS:   PDMP reviewed.  Spravato  filled 12/31/2023. I provided 60 minutes of face to face time during this encounter, including time spent before and after the visit in records review, medical decision making, counseling pertinent to today's visit, and charting.   [name] needs to do well on the weekly Spravato  treatments.  She is responding to all other medications as well so no changes will be made.  Continue Xanax  0.5 mg, daily as needed. Continue Wellbutrin  XL 150 mg daily. Continue Vraylar  1.5 mg, 1 p.o. daily. Continue Spravato  weekly. Continue Fetzima  120 mg, 1 p.o. daily. Continue trazodone  50 mg, 1-2 nightly as needed sleep. Return in 1 week.  Marvia Slocumb, PA-C

## 2024-01-16 ENCOUNTER — Encounter: Payer: Self-pay | Admitting: Physician Assistant

## 2024-01-16 ENCOUNTER — Ambulatory Visit

## 2024-01-16 ENCOUNTER — Ambulatory Visit: Admitting: Physician Assistant

## 2024-01-16 VITALS — BP 141/94 | HR 67

## 2024-01-16 DIAGNOSIS — F411 Generalized anxiety disorder: Secondary | ICD-10-CM

## 2024-01-16 DIAGNOSIS — F339 Major depressive disorder, recurrent, unspecified: Secondary | ICD-10-CM

## 2024-01-16 NOTE — Progress Notes (Signed)
 [facility]  Patient ID: Robin Stevenson,  MRN: [id number]  PCP: Lysle Saunas, MD (Inactive)  Date of Evaluation: 01/16/2024 Time spent:60 minutes  Chief Complaint:  Chief Complaint   Depression    HISTORY/CURRENT STATUS: HPI for Spravato  treatment, she sees Irving Mantle, NP who is out of the office today.  Robin Stevenson continues to respond well to weekly Spravato  treatments.  Energy and motivation are good for the most part.  Sleeps ok. ADLs and personal hygiene are normal.   No change in memory.   Appetite has not changed.  Weight is stable. Has anxiety occas, takes Xanax  when needed although not often, and it is effective.  Denies suicidal or homicidal thoughts.  Patient denies increased energy with decreased need for sleep, increased talkativeness, racing thoughts, impulsivity or risky behaviors, increased spending, increased libido, grandiosity, increased irritability or anger, paranoia, or hallucinations.  Denies dizziness, syncope, seizures, numbness, tingling, tremor, tics, unsteady gait, slurred speech, confusion. Denies muscle or joint pain, stiffness, or dystonia. Denies unexplained weight loss, frequent infections, or sores that heal slowly.  No polyphagia, polydipsia, or polyuria. Denies visual changes or paresthesias.   Individual Medical History/ Review of Systems: Changes? :No   Past medications for mental health diagnoses include: Spravato , Xanax , Wellbutrin , Vraylar , gabapentin , Fetzima , trazodone   Allergies: Cat dander and Brewers yeast  Current Medications:  Current Outpatient Medications:    ALPRAZolam  (XANAX ) 0.5 MG tablet, Take 1 tablet at bedtime the night before procedure. Take 1 tablet 1 hour before arrival. Bring third tablet with you to procedure., Disp: 3 tablet, Rfl: 0   atorvastatin  (LIPITOR) 20 MG tablet, Take 1 tablet (20 mg total) by mouth daily., Disp: 90 tablet, Rfl: 3   buPROPion  (WELLBUTRIN  XL) 150 MG 24 hr tablet, Take 1 tablet (150  mg total) by mouth every morning., Disp: 90 tablet, Rfl: 0   cariprazine  (VRAYLAR ) 1.5 MG capsule, Take 1 capsule (1.5 mg total) by mouth daily., Disp: 28 capsule, Rfl: 0   celecoxib  (CELEBREX ) 200 MG capsule, Take 1 capsule by mouth single dose as directed. Take 1 hour prior to arrival for surgery, Disp: 1 capsule, Rfl: 0   Esketamine HCl, 84 MG Dose, (SPRAVATO , 84 MG DOSE,) 28 MG/DEVICE SOPK, Dispense X 3 (28 mg) devices for treatments every 7 days, administer intranasally, Disp: 3 each, Rfl: 3   esomeprazole (NEXIUM) 20 MG capsule, Take 20 mg by mouth once., Disp: , Rfl:    Levomilnacipran  HCl ER (FETZIMA ) 120 MG CP24, Take 1 capsule by mouth daily., Disp: 90 capsule, Rfl: 3   ondansetron  (ZOFRAN ) 4 MG tablet, Take 1 tablet  by mouth every 6 hours as needed for nausea, Disp: 15 tablet, Rfl: 0   SUMAtriptan  (IMITREX ) 50 MG tablet, Take 1 tablet (50 mg total) by mouth as needed at onset of headache. May repeat 1 dose after 2 hours if needed. Only take 2-3 times a week., Disp: 9 tablet, Rfl: 6   traZODone  (DESYREL ) 50 MG tablet, Take 50 mg by mouth at bedtime., Disp: , Rfl:    atovaquone -proguanil (MALARONE ) 250-100 MG TABS tablet, Take 1 tablet by mouth once daily begin 2 days prearrival,take daily during stay & continue for 7 days posttravel for malaria prevention, Disp: 24 tablet, Rfl: 0   azithromycin  (ZITHROMAX ) 500 MG tablet, For non bloody diarrhea,take 2 tabs on day 1, if resolved,stop med,If diarrhea persists 1 tab on day 2 & 3. For bloody diarrhea,2 tabs on day 1 & 1 tab on day 2 & 3,  Disp: 4 tablet, Rfl: 0   Esketamine HCl, 56 MG Dose, (SPRAVATO , 56 MG DOSE,) 28 MG/DEVICE SOPK, Dispense X 2 (28mg ) devices, administer on day 1 intranasally as directed, Disp: 4 each, Rfl: 0   fluocinonide  ointment (LIDEX ) 0.05 %, Apply as directed to skin twice a day, Disp: 30 g, Rfl: 0   gabapentin  (NEURONTIN ) 300 MG capsule, Take 1 capsule (300 mg total) by mouth 3 (three) times daily. Take first dose 1 hour  before arrival for surgery., Disp: 21 capsule, Rfl: 0   hydrocortisone  2.5 % cream, Apply a small amount to affected area twice daily for 1-2 weeks as directed., Disp: 20 g, Rfl: 0   ketoconazole  (NIZORAL ) 2 % cream, Apply a small amount to affected area on left foot/toe once a day, Disp: 30 g, Rfl: 0   meclizine  (ANTIVERT ) 25 MG tablet, Take 1 tablet (25 mg total) by mouth as directed prior to treatment., Disp: 60 tablet, Rfl: 0   metFORMIN  (GLUCOPHAGE ) 500 MG tablet, Take 1 tablet (500 mg total) by mouth daily., Disp: 90 tablet, Rfl: 1   metFORMIN  (GLUCOPHAGE ) 500 MG tablet, Take 1 tablet (500 mg total) by mouth 2 (two) times daily., Disp: 180 tablet, Rfl: 1   metFORMIN  (GLUCOPHAGE ) 500 MG tablet, Take 1 tablet (500 mg total) by mouth 2 (two) times daily., Disp: 90 tablet, Rfl: 1   metFORMIN  (GLUCOPHAGE -XR) 500 MG 24 hr tablet, Take 1 tablet (500 mg total) by mouth daily. Please do not fill if not picked up within 30 days., Disp: 30 tablet, Rfl: 0   methocarbamol  (ROBAXIN ) 500 MG tablet, Take 1 tablet (500 mg total) by mouth every 6 (six) hours as needed for muscle spasms. (Patient not taking: Reported on 10/29/2023), Disp: 45 tablet, Rfl: 0   ondansetron  (ZOFRAN ) 4 MG tablet, Take 1 tablet (4 mg total) by mouth every 6 (six) hours for nausea. TAKE 4 tablets 15 minutes prior to arrival for surgery, Disp: 30 tablet, Rfl: 0   ondansetron  (ZOFRAN -ODT) 4 MG disintegrating tablet, Dissolve 1 tablet (4 mg total) by mouth every 8 (eight) hours as needed for nausea or vomiting., Disp: 20 tablet, Rfl: 1   oxyCODONE  (OXY IR/ROXICODONE ) 5 MG immediate release tablet, Take 1-2 tablet by mouth every six to eight hours as needed for Post Op Pain. DO NOT EXCEED  6 tablets in 24 hours., Disp: 30 tablet, Rfl: 0   polyethylene glycol (MIRALAX / GLYCOLAX) packet, Take 17 g by mouth daily., Disp: , Rfl:    polyethylene glycol-electrolytes (NULYTELY) 420 g solution, Take 4,000 mLs by mouth Nightly., Disp: 4000 mL, Rfl: 0    Semaglutide -Weight Management (WEGOVY ) 2.4 MG/0.75ML SOAJ, Inject 2.4 mg into the skin once a week for weight loss., Disp: 3 mL, Rfl: 0   sulfamethoxazole -trimethoprim  (BACTRIM  DS) 800-160 MG tablet, Take 1 tablet by mouth 2 (two) times daily as directed. Start the day after surgery., Disp: 10 tablet, Rfl: 0   SUMAtriptan  (IMITREX ) 50 MG tablet, TAKE 1 TABLET BY MOUTH AT ONSET OF HEADACHE, MAY REPEAT 1 DOSE AFTER 2 HOURS IF NEEDED, Disp: 10 tablet, Rfl: 5   SUMAtriptan  (IMITREX ) 50 MG tablet, Take 1 tablet by mouth at onset of headache, may repeat same dose once 2 hours later if needed, Disp: 10 tablet, Rfl: 5   SUMAtriptan  (IMITREX ) 50 MG tablet, Take 1 tablet (50 mg total) by mouth at the onset of headache. May repeat 1 dose after 2 hours if needed., Disp: 10 tablet, Rfl: 5   SUMAtriptan  (  IMITREX ) 50 MG tablet, Take 1 tablet (50 mg total) by mouth at onset of headache,may repeat 1 dose after 2 hours if needed, 2 to 3 times a week, Disp: 9 tablet, Rfl: 6   terbinafine  (LAMISIL ) 250 MG tablet, Take 1 tablet (250 mg total) by mouth daily for Tinea, Disp: 14 tablet, Rfl: 0   traZODone  (DESYREL ) 50 MG tablet, TAKE 1 TO 2 TABLETS BY MOUTH AT BEDTIME, Disp: 180 tablet, Rfl: 1   traZODone  (DESYREL ) 50 MG tablet, Take 1-2 tablets (50-100 mg total) by mouth at bedtime., Disp: 180 tablet, Rfl: 1   traZODone  (DESYREL ) 50 MG tablet, Take 1-2 tablets (50-100 mg total) by mouth at bedtime., Disp: 180 tablet, Rfl: 2   WEGOVY  0.5 MG/0.5ML SOAJ, Inject 0.5 mg into the skin once a week for weight loss., Disp: 2 mL, Rfl: 0   WEGOVY  1 MG/0.5ML SOAJ, Inject 1 mg into the skin once a week for weight loss, Disp: 2 mL, Rfl: 0   WEGOVY  2.4 MG/0.75ML SOAJ, Inject 2.4 mg into the skin once a week for weight loss., Disp: 3 mL, Rfl: 0   WEGOVY  2.4 MG/0.75ML SOAJ, Inject 2.4 mg into the skin once a week fro weight loss, Disp: 3 mL, Rfl: 0   WEGOVY  2.4 MG/0.75ML SOAJ, Inject 2.4 mg into the skin once a week for weight loss, Disp:  3 mL, Rfl: 0   WEGOVY  2.4 MG/0.75ML SOAJ, Inject 2.4 mg,sub-q, into the skin once a week for weight loss, Disp: 3 mL, Rfl: 0   WEGOVY  2.4 MG/0.75ML SOAJ, Inject 2.4 mg into the skin once a week for weight loss., Disp: 3 mL, Rfl: 0   WEGOVY  2.4 MG/0.75ML SOAJ, Inject 2.4 mg into the skin once a week for weight loss., Disp: 3 mL, Rfl: 0   WEGOVY  2.4 MG/0.75ML SOAJ, Inject 2.4 mg into the skin once a week for weight loss, Disp: 3 mL, Rfl: 0 Medication Side Effects: none  Family Medical/ Social History: Changes?   No  MENTAL HEALTH EXAM:  There were no vitals taken for this visit.There is no height or weight on file to calculate BMI.  General Appearance: Casual and Well Groomed  Eye Contact:  Good  Speech:  Clear and Coherent and Normal Rate  Volume:  Normal  Mood:  Euthymic  Affect:  Congruent  Thought Process:  Goal Directed and Descriptions of Associations: Circumstantial  Orientation:  Full (Time, Place, and Person)  Thought Content: Logical   Suicidal Thoughts:  No  Homicidal Thoughts:  No  Memory:  WNL  Judgement:  Good  Insight:  Good  Psychomotor Activity:  Normal  Concentration:  Concentration: Good and Attention Span: Good  Recall:  Good  Fund of Knowledge: Good  Language: Good  Assets:  Communication Skills Desire for Improvement Financial Resources/Insurance Housing Leisure Time Resilience Transportation  ADL's:  Intact  Cognition: WNL  Prognosis:  Good   Patient was administered Spravato  84 mg intranasally today.  The patient experienced the typical dissociation which gradually resolved over the 2-hour period of observation.  There were no complications.  Specifically the patient did not have nausea or vomiting or headache.  Blood pressures remained within normal ranges at the 40-minute and 2-hour follow-up intervals.  Pulse ox were nl. By the time the 2-hour observation period was met the patient was alert and oriented and able to exit without assistance.  Patient  feels the Spravato  administration is helpful for the treatment resistant depression and would like to continue  the treatment.  See nursing note for further details.  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 Generalized anxiety disorder  F41.1       Receiving Psychotherapy: No   RECOMMENDATIONS:   PDMP reviewed.  Spravato  filled 12/31/2023. I provided 60 minutes of face to face time during this encounter, including time spent before and after the visit in records review, medical decision making, counseling pertinent to today's visit, and charting.   She's doing well on current regimen so no changes will be made.   Continue Xanax  0.5 mg, daily as needed. Continue Wellbutrin  XL 150 mg daily. Continue Vraylar  1.5 mg, 1 p.o. daily. Continue Spravato  weekly. Continue Fetzima  120 mg, 1 p.o. daily. Continue trazodone  50 mg, 1-2 nightly as needed sleep. Return in 1 week.  Marvia Slocumb, PA-C

## 2024-01-16 NOTE — Progress Notes (Signed)
 NURSES NOTE:   Pt arrived for her # 38 Spravato  Treatment for treatment resistant depression, the starting dose was 56 mg (2 of the 28 mg nasal sprays) pt stayed at that dose for the first 4 treatments and also takes Zofran  and Meclizine  prior to arrival at office. Pt received 84 mg (3 of the 28 mg nasal sprays) total again today and did also premedicate. Pt is now using her medical benefits and receives Spravato  through [place] [facility], today is the first time using it. Pt sees Irving Mantle, NP and she will follow her throughout her treatments and follow ups. Spravato  medication is stored at treatment center per REMS/FDA guidelines. The medication is required to be locked behind two doors per REMS/FDA protocol. Medication is also disposed of properly after each use per regulations. All documentation for [facility]S is completed and submitted per FDA/REMS requirements.          Pt directed to treatment room, started vitals at 10:00 AM, 134/80, pulse 70, SpO2 99%. Pt did take Zofran  4 mg and Meclizine  25 mg prior to arriving at office, an hour ahead. Instructed patient to blow her nose if needed then recline back to a 45 degree angle if that was more comfortable for her. Gave patient first dose 28 mg nasal spray, administered in each nostril as directed and observed by nurse, waited 5 more minutes for the second and third doses. Placed a fan in her room. After all doses given pt did not complain of nausea. pt keeps water and some mints to help with the taste of Spravato  it gives the metallic taste. Her 40 minute check was at 10:46 AM, vital signs were 142/94, pulse 70, SpO2 97%. Pt aware she would be monitored for a total time of 120 minutes.  Discharge vitals were taken at 12:09 PM 141/94, P 67, SpO2 99%. Pt met with Marvia Slocumb, PA-C to discuss her treatment. Pt advised to relax the rest of the day. No driving, no intense activities. Verbalized understanding. Pt reports no issues today. Nurse was with pt a total of  60 minutes for clinical assessment. Pt would like to return, Wednesday, June 4th. Pt instructed to call office with any problems or questions prior to next treatment.      LOT [id number] EXP FEB 2028

## 2024-01-23 ENCOUNTER — Ambulatory Visit

## 2024-01-23 ENCOUNTER — Ambulatory Visit: Admitting: Physician Assistant

## 2024-01-23 ENCOUNTER — Encounter: Payer: Self-pay | Admitting: Physician Assistant

## 2024-01-23 VITALS — BP 160/92 | HR 70

## 2024-01-23 DIAGNOSIS — F339 Major depressive disorder, recurrent, unspecified: Secondary | ICD-10-CM

## 2024-01-23 DIAGNOSIS — F411 Generalized anxiety disorder: Secondary | ICD-10-CM

## 2024-01-23 NOTE — Progress Notes (Unsigned)
 [facility]  Patient ID: Robin Stevenson,  MRN: [id number]  PCP: Lysle Saunas, MD (Inactive)  Date of Evaluation: 01/23/2024 Time spent:55 minutes  Chief Complaint:  Chief Complaint   Depression; Other    HISTORY/CURRENT STATUS: HPI for Spravato  treatment, she sees Irving Mantle, NP who is out of the office today.  [name] states she's not sure if the Spravato  is working anymore.  "It may just be this stage of my life.  Or perimenopause. Who knows?"   Individual Medical History/ Review of Systems: Changes? :No   Past medications for mental health diagnoses include: Spravato , Xanax , Wellbutrin , Vraylar , gabapentin , Fetzima , trazodone   Allergies: Cat dander and Brewers yeast  Current Medications:  Current Outpatient Medications:    ALPRAZolam  (XANAX ) 0.5 MG tablet, Take 1 tablet at bedtime the night before procedure. Take 1 tablet 1 hour before arrival. Bring third tablet with you to procedure., Disp: 3 tablet, Rfl: 0   atorvastatin  (LIPITOR) 20 MG tablet, Take 1 tablet (20 mg total) by mouth daily., Disp: 90 tablet, Rfl: 3   buPROPion  (WELLBUTRIN  XL) 150 MG 24 hr tablet, Take 1 tablet (150 mg total) by mouth every morning., Disp: 90 tablet, Rfl: 0   cariprazine  (VRAYLAR ) 1.5 MG capsule, Take 1 capsule (1.5 mg total) by mouth daily., Disp: 28 capsule, Rfl: 0   celecoxib  (CELEBREX ) 200 MG capsule, Take 1 capsule by mouth single dose as directed. Take 1 hour prior to arrival for surgery, Disp: 1 capsule, Rfl: 0   Esketamine HCl, 84 MG Dose, (SPRAVATO , 84 MG DOSE,) 28 MG/DEVICE SOPK, Dispense X 3 (28 mg) devices for treatments every 7 days, administer intranasally, Disp: 3 each, Rfl: 3   esomeprazole (NEXIUM) 20 MG capsule, Take 20 mg by mouth once., Disp: , Rfl:    gabapentin  (NEURONTIN ) 300 MG capsule, Take 1 capsule (300 mg total) by mouth 3 (three) times daily. Take first dose 1 hour before arrival for surgery., Disp: 21 capsule, Rfl: 0   hydrocortisone  2.5 % cream,  Apply a small amount to affected area twice daily for 1-2 weeks as directed., Disp: 20 g, Rfl: 0   Levomilnacipran  HCl ER (FETZIMA ) 120 MG CP24, Take 1 capsule by mouth daily., Disp: 90 capsule, Rfl: 3   meclizine  (ANTIVERT ) 25 MG tablet, Take 1 tablet (25 mg total) by mouth as directed prior to treatment., Disp: 60 tablet, Rfl: 0   metFORMIN  (GLUCOPHAGE ) 500 MG tablet, Take 1 tablet (500 mg total) by mouth daily., Disp: 90 tablet, Rfl: 1   ondansetron  (ZOFRAN -ODT) 4 MG disintegrating tablet, Dissolve 1 tablet (4 mg total) by mouth every 8 (eight) hours as needed for nausea or vomiting., Disp: 20 tablet, Rfl: 1   SUMAtriptan  (IMITREX ) 50 MG tablet, Take 1 tablet (50 mg total) by mouth as needed at onset of headache. May repeat 1 dose after 2 hours if needed. Only take 2-3 times a week., Disp: 9 tablet, Rfl: 6   traZODone  (DESYREL ) 50 MG tablet, Take 50 mg by mouth at bedtime., Disp: , Rfl:    atovaquone -proguanil (MALARONE ) 250-100 MG TABS tablet, Take 1 tablet by mouth once daily begin 2 days prearrival,take daily during stay & continue for 7 days posttravel for malaria prevention, Disp: 24 tablet, Rfl: 0   azithromycin  (ZITHROMAX ) 500 MG tablet, For non bloody diarrhea,take 2 tabs on day 1, if resolved,stop med,If diarrhea persists 1 tab on day 2 & 3. For bloody diarrhea,2 tabs on day 1 & 1 tab on day 2 & 3, Disp:  4 tablet, Rfl: 0   Esketamine HCl, 56 MG Dose, (SPRAVATO , 56 MG DOSE,) 28 MG/DEVICE SOPK, Dispense X 2 (28mg ) devices, administer on day 1 intranasally as directed, Disp: 4 each, Rfl: 0   fluocinonide  ointment (LIDEX ) 0.05 %, Apply as directed to skin twice a day, Disp: 30 g, Rfl: 0   ketoconazole  (NIZORAL ) 2 % cream, Apply a small amount to affected area on left foot/toe once a day, Disp: 30 g, Rfl: 0   metFORMIN  (GLUCOPHAGE ) 500 MG tablet, Take 1 tablet (500 mg total) by mouth 2 (two) times daily., Disp: 180 tablet, Rfl: 1   metFORMIN  (GLUCOPHAGE ) 500 MG tablet, Take 1 tablet (500 mg  total) by mouth 2 (two) times daily., Disp: 90 tablet, Rfl: 1   metFORMIN  (GLUCOPHAGE -XR) 500 MG 24 hr tablet, Take 1 tablet (500 mg total) by mouth daily. Please do not fill if not picked up within 30 days., Disp: 30 tablet, Rfl: 0   methocarbamol  (ROBAXIN ) 500 MG tablet, Take 1 tablet (500 mg total) by mouth every 6 (six) hours as needed for muscle spasms. (Patient not taking: Reported on 10/29/2023), Disp: 45 tablet, Rfl: 0   ondansetron  (ZOFRAN ) 4 MG tablet, Take 1 tablet  by mouth every 6 hours as needed for nausea, Disp: 15 tablet, Rfl: 0   ondansetron  (ZOFRAN ) 4 MG tablet, Take 1 tablet (4 mg total) by mouth every 6 (six) hours for nausea. TAKE 4 tablets 15 minutes prior to arrival for surgery, Disp: 30 tablet, Rfl: 0   oxyCODONE  (OXY IR/ROXICODONE ) 5 MG immediate release tablet, Take 1-2 tablet by mouth every six to eight hours as needed for Post Op Pain. DO NOT EXCEED  6 tablets in 24 hours., Disp: 30 tablet, Rfl: 0   polyethylene glycol (MIRALAX / GLYCOLAX) packet, Take 17 g by mouth daily., Disp: , Rfl:    polyethylene glycol-electrolytes (NULYTELY) 420 g solution, Take 4,000 mLs by mouth Nightly., Disp: 4000 mL, Rfl: 0   Semaglutide -Weight Management (WEGOVY ) 2.4 MG/0.75ML SOAJ, Inject 2.4 mg into the skin once a week for weight loss., Disp: 3 mL, Rfl: 0   sulfamethoxazole -trimethoprim  (BACTRIM  DS) 800-160 MG tablet, Take 1 tablet by mouth 2 (two) times daily as directed. Start the day after surgery., Disp: 10 tablet, Rfl: 0   SUMAtriptan  (IMITREX ) 50 MG tablet, TAKE 1 TABLET BY MOUTH AT ONSET OF HEADACHE, MAY REPEAT 1 DOSE AFTER 2 HOURS IF NEEDED, Disp: 10 tablet, Rfl: 5   SUMAtriptan  (IMITREX ) 50 MG tablet, Take 1 tablet by mouth at onset of headache, may repeat same dose once 2 hours later if needed, Disp: 10 tablet, Rfl: 5   SUMAtriptan  (IMITREX ) 50 MG tablet, Take 1 tablet (50 mg total) by mouth at the onset of headache. May repeat 1 dose after 2 hours if needed., Disp: 10 tablet, Rfl:  5   SUMAtriptan  (IMITREX ) 50 MG tablet, Take 1 tablet (50 mg total) by mouth at onset of headache,may repeat 1 dose after 2 hours if needed, 2 to 3 times a week, Disp: 9 tablet, Rfl: 6   terbinafine  (LAMISIL ) 250 MG tablet, Take 1 tablet (250 mg total) by mouth daily for Tinea, Disp: 14 tablet, Rfl: 0   traZODone  (DESYREL ) 50 MG tablet, TAKE 1 TO 2 TABLETS BY MOUTH AT BEDTIME, Disp: 180 tablet, Rfl: 1   traZODone  (DESYREL ) 50 MG tablet, Take 1-2 tablets (50-100 mg total) by mouth at bedtime., Disp: 180 tablet, Rfl: 1   traZODone  (DESYREL ) 50 MG tablet, Take 1-2 tablets (  50-100 mg total) by mouth at bedtime., Disp: 180 tablet, Rfl: 2   WEGOVY  0.5 MG/0.5ML SOAJ, Inject 0.5 mg into the skin once a week for weight loss., Disp: 2 mL, Rfl: 0   WEGOVY  1 MG/0.5ML SOAJ, Inject 1 mg into the skin once a week for weight loss, Disp: 2 mL, Rfl: 0   WEGOVY  2.4 MG/0.75ML SOAJ, Inject 2.4 mg into the skin once a week for weight loss., Disp: 3 mL, Rfl: 0   WEGOVY  2.4 MG/0.75ML SOAJ, Inject 2.4 mg into the skin once a week fro weight loss, Disp: 3 mL, Rfl: 0   WEGOVY  2.4 MG/0.75ML SOAJ, Inject 2.4 mg into the skin once a week for weight loss, Disp: 3 mL, Rfl: 0   WEGOVY  2.4 MG/0.75ML SOAJ, Inject 2.4 mg,sub-q, into the skin once a week for weight loss, Disp: 3 mL, Rfl: 0   WEGOVY  2.4 MG/0.75ML SOAJ, Inject 2.4 mg into the skin once a week for weight loss., Disp: 3 mL, Rfl: 0   WEGOVY  2.4 MG/0.75ML SOAJ, Inject 2.4 mg into the skin once a week for weight loss., Disp: 3 mL, Rfl: 0   WEGOVY  2.4 MG/0.75ML SOAJ, Inject 2.4 mg into the skin once a week for weight loss, Disp: 3 mL, Rfl: 0 Medication Side Effects: none  Family Medical/ Social History: Changes?   No  MENTAL HEALTH EXAM:  There were no vitals taken for this visit.There is no height or weight on file to calculate BMI.  General Appearance: Casual and Well Groomed  Eye Contact:  Good  Speech:  Clear and Coherent and Normal Rate  Volume:  Normal  Mood:   Depressed  Affect:  Depressed and Tearful  Thought Process:  Goal Directed and Descriptions of Associations: Circumstantial  Orientation:  Full (Time, Place, and Person)  Thought Content: Logical   Suicidal Thoughts:  No  Homicidal Thoughts:  No  Memory:  WNL  Judgement:  Good  Insight:  Good  Psychomotor Activity:  Normal  Concentration:  Concentration: Good and Attention Span: Good  Recall:  Good  Fund of Knowledge: Good  Language: Good  Assets:  Communication Skills Desire for Improvement Financial Resources/Insurance Housing Leisure Time Resilience Transportation  ADL's:  Intact  Cognition: WNL  Prognosis:  Good   Patient was administered Spravato  84 mg intranasally today.  The patient experienced the typical dissociation which gradually resolved over the 2-hour period of observation.  There were no complications.  Specifically the patient did not have nausea or vomiting or headache.  Blood pressures remained within normal ranges at the 40-minute and 2-hour follow-up intervals.  Pulse ox were nl. By the time the 2-hour observation period was met the patient was alert and oriented and able to exit without assistance.  Patient feels the Spravato  administration is helpful for the treatment resistant depression and would like to continue the treatment.  See nursing note for further details.  DIAGNOSES:  No diagnosis found.  Receiving Psychotherapy: No   RECOMMENDATIONS:   PDMP reviewed.  Spravato  filled 12/31/2023. I provided 55 minutes of face to face time during this encounter, including time spent before and after the visit in records review, medical decision making, counseling pertinent to today's visit, and charting.      Continue Xanax  0.5 mg, daily as needed. Continue Wellbutrin  XL 150 mg daily. Continue Vraylar  1.5 mg, 1 p.o. daily. Continue Spravato  weekly. Continue Fetzima  120 mg, 1 p.o. daily. Continue trazodone  50 mg, 1-2 nightly as needed sleep. Return in 1  week.  Marvia Slocumb, PA-C

## 2024-01-23 NOTE — Progress Notes (Signed)
 NURSES NOTE:   Pt arrived for her # 39 Spravato  Treatment for treatment resistant depression, the starting dose was 56 mg (2 of the 28 mg nasal sprays) pt stayed at that dose for the first 4 treatments and also takes Zofran  and Meclizine  prior to arrival at office. Pt received 84 mg (3 of the 28 mg nasal sprays) total again today and did also premedicate. Pt is now using her medical benefits and receives Spravato  through [place] and [facility], today is the first time using it. Pt sees Irving Mantle, NP and she will follow her throughout her treatments and follow ups. Spravato  medication is stored at treatment center per REMS/FDA guidelines. The medication is required to be locked behind two doors per REMS/FDA protocol. Medication is also disposed of properly after each use per regulations. All documentation for REMS is completed and submitted per FDA/REMS requirements.          Pt directed to treatment room, started vitals at 10:05 AM, 141/79, pulse 72, SpO2 96%. Pt did take Zofran  4 mg and Meclizine  25 mg prior to arriving at office, an hour ahead. Instructed patient to blow her nose if needed then recline back to a 45 degree angle if that was more comfortable for her. Gave patient first dose 28 mg nasal spray, administered in each nostril as directed and observed by nurse, waited 5 more minutes for the second and third doses. After all doses given pt did not complain of nausea. pt keeps water and some mints to help with the taste of Spravato  it gives the metallic taste. Her 40 minute check was at 10:45 AM, vital signs were 156/93, pulse 71, SpO2 97%. Pt aware she would be monitored for a total time of 120 minutes.  Discharge vitals were taken at 12:15 PM 160/92, P 70, SpO2 99%. Pt met with Marvia Slocumb, PA-C to discuss her treatment. Pt advised to relax the rest of the day. No driving, no intense activities. Verbalized understanding. Pt reports no issues today. Nurse was with pt a total of 60 minutes for clinical  assessment. Pt would like to return, Wednesday, June 18th due to them being out of town next week. Pt instructed to call office with any problems or questions prior to next treatment.      LOT [id number] EXP OCT 2027

## 2024-01-24 ENCOUNTER — Encounter: Payer: Self-pay | Admitting: Physician Assistant

## 2024-02-06 ENCOUNTER — Ambulatory Visit: Admitting: Adult Health

## 2024-02-06 ENCOUNTER — Ambulatory Visit

## 2024-02-06 ENCOUNTER — Encounter: Payer: Self-pay | Admitting: Adult Health

## 2024-02-06 VITALS — BP 154/91 | HR 68

## 2024-02-06 DIAGNOSIS — F339 Major depressive disorder, recurrent, unspecified: Secondary | ICD-10-CM

## 2024-02-06 NOTE — Progress Notes (Signed)
 Robin Stevenson [id number] 10-17-1975 48 y.o.  Subjective:   Patient ID:  Robin Stevenson is a 48 y.o. (DOB 07/29/1976) female.  Chief Complaint: No chief complaint on file.   HPI Robin Stevenson presents to the office today for follow-up of  treatment resistant depression (TRD).  Spravato  treatment    Patient was administered Spravato  84 mg intranasally today. Patient was observed by provider throughout Spravato  treatment. The patient experienced the typical dissociation which gradually resolved over the 2-hour period of observation. She did not feel sleepy, decreased feeling of sensitivity (numbness), lack of energy, increased blood pressure, feeling happy or very excited, or headache. She denied feeling disconnected from themself, their thoughts, feelings and things around them. Blood pressures remained within normal ranges at the 40-minute and 2-hour follow-up intervals. By the time the 2-hour observation period was met the patient was alert and oriented and able to exit without assistance. 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 See nursing note for further details.  Robin Stevenson continues to do well with current Spravato  treatment regimen. There was discussion concerning possible hormonal changes effecting mood. She plans to follow up with GYN to discuss options.  Reports mood as variable - possibly hormonal component. Denies depression. She reports stable interest and motivation. She reports completing ADLs and personal hygiene. She reports stable focus and concentration. Reports completing tasks. Appetite is adequate and her weight is stable. She reports sleeping well most nights. Denies suicidal or homicidal thoughts.   ECT-MADRS    Flowsheet Row Clinical Support from 01/23/2024 in [facility]  MADRS Total Score 18     Review of Systems:  Review of Systems  Musculoskeletal:  Negative for gait  problem.  Neurological:  Negative for tremors.  Psychiatric/Behavioral:         Please refer to HPI    Medications: I have reviewed the patient's current medications.  Current Outpatient Medications  Medication Sig Dispense Refill   ALPRAZolam  (XANAX ) 0.5 MG tablet Take 1 tablet at bedtime the night before procedure. Take 1 tablet 1 hour before arrival. Bring third tablet with you to procedure. 3 tablet 0   atorvastatin  (LIPITOR) 20 MG tablet Take 1 tablet (20 mg total) by mouth daily. 90 tablet 3   atovaquone -proguanil (MALARONE ) 250-100 MG TABS tablet Take 1 tablet by mouth once daily begin 2 days prearrival,take daily during stay & continue for 7 days posttravel for malaria prevention 24 tablet 0   azithromycin  (ZITHROMAX ) 500 MG tablet For non bloody diarrhea,take 2 tabs on day 1, if resolved,stop med,If diarrhea persists 1 tab on day 2 & 3. For bloody diarrhea,2 tabs on day 1 & 1 tab on day 2 & 3 4 tablet 0   buPROPion  (WELLBUTRIN  XL) 150 MG 24 hr tablet Take 1 tablet (150 mg total) by mouth every morning. 90 tablet 0   cariprazine  (VRAYLAR ) 1.5 MG capsule Take 1 capsule (1.5 mg total) by mouth daily. 28 capsule 0   celecoxib  (CELEBREX ) 200 MG capsule Take 1 capsule by mouth single dose as directed. Take 1 hour prior to arrival for surgery 1 capsule 0   Esketamine HCl, 56 MG Dose, (SPRAVATO , 56 MG DOSE,) 28 MG/DEVICE SOPK Dispense X 2 (28mg ) devices, administer on day 1 intranasally as directed 4 each 0   Esketamine HCl, 84 MG Dose, (SPRAVATO , 84 MG DOSE,) 28 MG/DEVICE SOPK Dispense X 3 (28 mg) devices for treatments every 7 days, administer intranasally 3 each 3  esomeprazole (NEXIUM) 20 MG capsule Take 20 mg by mouth once.     fluocinonide  ointment (LIDEX ) 0.05 % Apply as directed to skin twice a day 30 g 0   gabapentin  (NEURONTIN ) 300 MG capsule Take 1 capsule (300 mg total) by mouth 3 (three) times daily. Take first dose 1 hour before arrival for surgery. 21 capsule 0   hydrocortisone   2.5 % cream Apply a small amount to affected area twice daily for 1-2 weeks as directed. 20 g 0   ketoconazole  (NIZORAL ) 2 % cream Apply a small amount to affected area on left foot/toe once a day 30 g 0   Levomilnacipran  HCl ER (FETZIMA ) 120 MG CP24 Take 1 capsule by mouth daily. 90 capsule 3   meclizine  (ANTIVERT ) 25 MG tablet Take 1 tablet (25 mg total) by mouth as directed prior to treatment. 60 tablet 0   metFORMIN  (GLUCOPHAGE ) 500 MG tablet Take 1 tablet (500 mg total) by mouth daily. 90 tablet 1   metFORMIN  (GLUCOPHAGE ) 500 MG tablet Take 1 tablet (500 mg total) by mouth 2 (two) times daily. 180 tablet 1   metFORMIN  (GLUCOPHAGE ) 500 MG tablet Take 1 tablet (500 mg total) by mouth 2 (two) times daily. 90 tablet 1   metFORMIN  (GLUCOPHAGE -XR) 500 MG 24 hr tablet Take 1 tablet (500 mg total) by mouth daily. Please do not fill if not picked up within 30 days. 30 tablet 0   methocarbamol  (ROBAXIN ) 500 MG tablet Take 1 tablet (500 mg total) by mouth every 6 (six) hours as needed for muscle spasms. (Patient not taking: Reported on 10/29/2023) 45 tablet 0   ondansetron  (ZOFRAN ) 4 MG tablet Take 1 tablet  by mouth every 6 hours as needed for nausea 15 tablet 0   ondansetron  (ZOFRAN ) 4 MG tablet Take 1 tablet (4 mg total) by mouth every 6 (six) hours for nausea. TAKE 4 tablets 15 minutes prior to arrival for surgery 30 tablet 0   ondansetron  (ZOFRAN -ODT) 4 MG disintegrating tablet Dissolve 1 tablet (4 mg total) by mouth every 8 (eight) hours as needed for nausea or vomiting. 20 tablet 1   oxyCODONE  (OXY IR/ROXICODONE ) 5 MG immediate release tablet Take 1-2 tablet by mouth every six to eight hours as needed for Post Op Pain. DO NOT EXCEED  6 tablets in 24 hours. 30 tablet 0   polyethylene glycol (MIRALAX / GLYCOLAX) packet Take 17 g by mouth daily.     polyethylene glycol-electrolytes (NULYTELY) 420 g solution Take 4,000 mLs by mouth Nightly. 4000 mL 0   Semaglutide -Weight Management (WEGOVY ) 2.4 MG/0.75ML  SOAJ Inject 2.4 mg into the skin once a week for weight loss. 3 mL 0   sulfamethoxazole -trimethoprim  (BACTRIM  DS) 800-160 MG tablet Take 1 tablet by mouth 2 (two) times daily as directed. Start the day after surgery. 10 tablet 0   SUMAtriptan  (IMITREX ) 50 MG tablet TAKE 1 TABLET BY MOUTH AT ONSET OF HEADACHE, MAY REPEAT 1 DOSE AFTER 2 HOURS IF NEEDED 10 tablet 5   SUMAtriptan  (IMITREX ) 50 MG tablet Take 1 tablet by mouth at onset of headache, may repeat same dose once 2 hours later if needed 10 tablet 5   SUMAtriptan  (IMITREX ) 50 MG tablet Take 1 tablet (50 mg total) by mouth at the onset of headache. May repeat 1 dose after 2 hours if needed. 10 tablet 5   SUMAtriptan  (IMITREX ) 50 MG tablet Take 1 tablet (50 mg total) by mouth at onset of headache,may repeat 1 dose after 2 hours  if needed, 2 to 3 times a week 9 tablet 6   SUMAtriptan  (IMITREX ) 50 MG tablet Take 1 tablet (50 mg total) by mouth as needed at onset of headache. May repeat 1 dose after 2 hours if needed. Only take 2-3 times a week. 9 tablet 6   terbinafine  (LAMISIL ) 250 MG tablet Take 1 tablet (250 mg total) by mouth daily for Tinea 14 tablet 0   traZODone  (DESYREL ) 50 MG tablet Take 50 mg by mouth at bedtime.     traZODone  (DESYREL ) 50 MG tablet TAKE 1 TO 2 TABLETS BY MOUTH AT BEDTIME 180 tablet 1   traZODone  (DESYREL ) 50 MG tablet Take 1-2 tablets (50-100 mg total) by mouth at bedtime. 180 tablet 1   traZODone  (DESYREL ) 50 MG tablet Take 1-2 tablets (50-100 mg total) by mouth at bedtime. 180 tablet 2   WEGOVY  0.5 MG/0.5ML SOAJ Inject 0.5 mg into the skin once a week for weight loss. 2 mL 0   WEGOVY  1 MG/0.5ML SOAJ Inject 1 mg into the skin once a week for weight loss 2 mL 0   WEGOVY  2.4 MG/0.75ML SOAJ Inject 2.4 mg into the skin once a week for weight loss. 3 mL 0   WEGOVY  2.4 MG/0.75ML SOAJ Inject 2.4 mg into the skin once a week fro weight loss 3 mL 0   WEGOVY  2.4 MG/0.75ML SOAJ Inject 2.4 mg into the skin once a week for weight loss  3 mL 0   WEGOVY  2.4 MG/0.75ML SOAJ Inject 2.4 mg,sub-q, into the skin once a week for weight loss 3 mL 0   WEGOVY  2.4 MG/0.75ML SOAJ Inject 2.4 mg into the skin once a week for weight loss. 3 mL 0   WEGOVY  2.4 MG/0.75ML SOAJ Inject 2.4 mg into the skin once a week for weight loss. 3 mL 0   WEGOVY  2.4 MG/0.75ML SOAJ Inject 2.4 mg into the skin once a week for weight loss 3 mL 0   No current facility-administered medications for this visit.    Medication Side Effects: None  Allergies:  Allergies  Allergen Reactions   Cat Dander Anaphylaxis   Brewers Yeast Rash    Past Medical History:  Diagnosis Date   Depression    takes Welbutrin and Fetzima    GERD (gastroesophageal reflux disease)    takes nexium    Past Medical History, Surgical history, Social history, and Family history were reviewed and updated as appropriate.   Please see review of systems for further details on the patient's review from today.   Objective:   Physical Exam:  There were no vitals taken for this visit.  Physical Exam Constitutional:      General: She is not in acute distress.  Musculoskeletal:        General: No deformity.   Neurological:     Mental Status: She is alert and oriented to person, place, and time.     Coordination: Coordination normal.   Psychiatric:        Attention and Perception: Attention and perception normal. She does not perceive auditory or visual hallucinations.        Mood and Affect: Mood normal. Mood is not anxious or depressed. Affect is not labile, blunt, angry or inappropriate.        Speech: Speech normal.        Behavior: Behavior normal.        Thought Content: Thought content normal. Thought content is not paranoid or delusional. Thought content does not include homicidal  or suicidal ideation. Thought content does not include homicidal or suicidal plan.        Cognition and Memory: Cognition and memory normal.        Judgment: Judgment normal.     Comments:  Insight intact     Lab Review:     Component Value Date/Time   NA 140 09/01/2016 0852   K 4.4 09/01/2016 0852   CL 104 09/01/2016 0852   CO2 31 09/01/2016 0852   GLUCOSE 90 09/01/2016 0852   BUN 14 09/01/2016 0852   CREATININE 0.86 09/01/2016 0852   CALCIUM  9.2 09/01/2016 0852   PROT 7.0 09/01/2016 0852   ALBUMIN 4.3 09/01/2016 0852   AST 33 09/01/2016 0852   ALT 70 (H) 09/01/2016 0852   ALKPHOS 66 09/01/2016 0852   BILITOT 0.7 09/01/2016 0852       Component Value Date/Time   WBC 5.1 09/01/2016 0852   RBC 4.30 09/01/2016 0852   HGB 13.5 09/01/2016 0852   HCT 38.9 09/01/2016 0852   PLT 286.0 09/01/2016 0852   MCV 90.4 09/01/2016 0852   MCHC 34.6 09/01/2016 0852   RDW 11.9 09/01/2016 0852   LYMPHSABS 1.5 09/01/2016 0852   MONOABS 0.4 09/01/2016 0852   EOSABS 0.1 09/01/2016 0852   BASOSABS 0.0 09/01/2016 0852    No results found for: POCLITH, LITHIUM   No results found for: PHENYTOIN, PHENOBARB, VALPROATE, CBMZ   .res Assessment: Plan:    PDMP reviewed.  Spravato  filled 02/05/2024.  I provided 60 minutes of face to face time during this encounter, including time spent before and after the visit in records review, medical decision making, counseling pertinent to today's visit, and charting.   Robin Stevenson reports an overall improved mood and continues to respond to weekly Spravato  treatments.    Continue Xanax  0.5 mg, daily as needed. Continue Wellbutrin  XL 150 mg daily. Continue Vraylar  1.5 mg, 1 p.o. daily. Continue Spravato  weekly. Continue Fetzima  120 mg, 1 p.o. daily. Continue trazodone  50 mg, 1-2 nightly as needed sleep.  Return in 1 week.   Diagnoses and all orders for this visit:  Recurrent major depression resistant to treatment [facility])     Please see After Visit Summary for patient specific instructions.  Future Appointments  Date Time Provider Department Center  02/13/2024 10:00 AM Verneda Golder, PA-C [facility] None  02/13/2024 10:00 AM  CP-NURSE CP-CP None  04/15/2024 10:20 AM [facility]    No orders of the defined types were placed in this encounter.   -------------------------------

## 2024-02-06 NOTE — Progress Notes (Signed)
 NURSES NOTE:   Pt arrived for her # 40 Spravato  Treatment for treatment resistant depression, the starting dose was 56 mg (2 of the 28 mg nasal sprays) pt stayed at that dose for the first 4 treatments and also takes Zofran  and Meclizine  prior to arrival at office. Pt received 84 mg (3 of the 28 mg nasal sprays) total again today and did also premedicate. Pt is now using her medical benefits and receives Spravato  through [place] and [facility], today is the first time using it. Pt sees Irving Mantle, NP and she will follow her throughout her treatments and follow ups. Spravato  medication is stored at treatment center per REMS/FDA guidelines. The medication is required to be locked behind two doors per REMS/FDA protocol. Medication is also disposed of properly after each use per regulations. All documentation for REMS is completed and submitted per FDA/REMS requirements.          Pt directed to treatment room, started vitals at 10:03 AM, 128/72, pulse 74, SpO2 97%. Pt did take Zofran  4 mg and Meclizine  25 mg prior to arriving at office, an hour ahead. Instructed patient to blow her nose if needed then recline back to a 45 degree angle if that was more comfortable for her. Gave patient first dose 28 mg nasal spray, administered in each nostril as directed and observed by nurse, waited 5 more minutes for the second and third doses. After all doses given pt did not complain of nausea. pt keeps water and some mints to help with the taste of Spravato  it gives the metallic taste. Her 40 minute check was at 10:45 AM, vital signs were 144/81, pulse 68, SpO2 99%. Pt aware she would be monitored for a total time of 120 minutes.  Discharge vitals were taken at 12:15 PM 160/92, P 70, SpO2 99%. Pt met with Leward Record to discuss her treatment. Pt advised to relax the rest of the day. No driving, no intense activities. Verbalized understanding. Pt reports no issues today. Nurse was with pt a total of 60 minutes for clinical assessment.   Pt instructed to call office with any problems or questions prior to next treatment. She is scheduled for next Wednesday, June 25th.     LOT [id number] EXP OCT 2027

## 2024-02-13 ENCOUNTER — Ambulatory Visit (INDEPENDENT_AMBULATORY_CARE_PROVIDER_SITE_OTHER): Admitting: Physician Assistant

## 2024-02-13 ENCOUNTER — Ambulatory Visit

## 2024-02-13 VITALS — BP 135/90 | HR 67

## 2024-02-13 DIAGNOSIS — F339 Major depressive disorder, recurrent, unspecified: Secondary | ICD-10-CM

## 2024-02-13 DIAGNOSIS — F411 Generalized anxiety disorder: Secondary | ICD-10-CM

## 2024-02-13 NOTE — Progress Notes (Signed)
 NURSES NOTE:   Pt arrived for her # 41 Spravato  Treatment for treatment resistant depression, the starting dose was 56 mg (2 of the 28 mg nasal sprays) pt stayed at that dose for the first 4 treatments and also takes Zofran  and Meclizine  prior to arrival at office. Pt received 84 mg (3 of the 28 mg nasal sprays) total again today and did also premedicate. Pt is now using her medical benefits and receives Spravato  through [facility], today is the first time using it. Pt sees Angeline Sayers, NP and she will follow her throughout her treatments and follow ups. Spravato  medication is stored at treatment center per REMS/FDA guidelines. The medication is required to be locked behind two doors per REMS/FDA protocol. Medication is also disposed of properly after each use per regulations. All documentation for REMS is completed and submitted per FDA/REMS requirements.          Pt directed to treatment room, started vitals at 10:15 AM, 143/80, pulse 68, SpO2 98%. Pt did take Zofran  4 mg and Meclizine  25 mg prior to arriving at office, an hour ahead. Instructed patient to blow her nose if needed then recline back to a 45 degree angle if that was more comfortable for her. Gave patient first dose 28 mg nasal spray, administered in each nostril as directed and observed by nurse, waited 5 more minutes for the second and third doses. After all doses given pt did not complain of nausea. pt keeps water and some mints to help with the taste of Spravato  it gives the metallic taste. Her 40 minute check was at 10:55 AM, vital signs were 150/92, pulse 70, SpO2 98%. Pt aware she would be monitored for a total time of 120 minutes.  Discharge vitals were taken at 12:01 PM 135/90, P 67, SpO2 99%. Pt met with Verneita Cooks, PA-C to discuss her treatment. Pt advised to relax the rest of the day. No driving, no intense activities. Verbalized understanding. Pt reports no issues today. Nurse was with pt a total of 60 minutes for clinical  assessment.  Pt instructed to call office with any problems or questions prior to next treatment. She is scheduled for next Tuesday, July 1st.     LOT [id number] EXP FEB 2028

## 2024-02-13 NOTE — Progress Notes (Signed)
 [facility]  Patient ID: Robin Stevenson,  MRN: [id number]  PCP: Signa Rush, MD (Inactive)  Date of Evaluation: 02/13/2024 Time spent:60 minutes  Chief Complaint:  Chief Complaint   Depression; Other    HISTORY/CURRENT STATUS: HPI for Spravato  treatment, she sees Angeline Sayers, NP who is out of the office today.  Continues to respond to the Spravato .  She'll be seeing her GYN sometime soon to discuss possible hormonal cause of mood changes. Energy and motivation are good most of the time.  No extreme sadness, tearfulness, or feelings of hopelessness.  Sleeps well most of the time. ADLs and personal hygiene are normal.   Denies any changes in concentration, making decisions, or remembering things.  Appetite has not changed.  Weight is stable.   Occas anxiety, no PA.  No mania, psychosis, or delirium.  Denies suicidal or homicidal thoughts.  Denies dizziness, syncope, seizures, numbness, tingling, tremor, tics, unsteady gait, slurred speech, confusion. Denies muscle or joint pain, stiffness, or dystonia. Denies unexplained weight loss, frequent infections, or sores that heal slowly.  No polyphagia, polydipsia, or polyuria. Denies visual changes or paresthesias.   Individual Medical History/ Review of Systems: Changes? :No   Past medications for mental health diagnoses include: Spravato , Xanax , Wellbutrin , Vraylar , gabapentin , Fetzima , trazodone   Allergies: Cat dander and Brewers yeast  Current Medications:  Current Outpatient Medications:    ALPRAZolam  (XANAX ) 0.5 MG tablet, Take 1 tablet at bedtime the night before procedure. Take 1 tablet 1 hour before arrival. Bring third tablet with you to procedure., Disp: 3 tablet, Rfl: 0   atorvastatin  (LIPITOR) 20 MG tablet, Take 1 tablet (20 mg total) by mouth daily., Disp: 90 tablet, Rfl: 3   buPROPion  (WELLBUTRIN  XL) 150 MG 24 hr tablet, Take 1 tablet (150 mg total) by mouth every morning., Disp: 90 tablet, Rfl: 0    cariprazine  (VRAYLAR ) 1.5 MG capsule, Take 1 capsule (1.5 mg total) by mouth daily., Disp: 28 capsule, Rfl: 0   Esketamine HCl, 84 MG Dose, (SPRAVATO , 84 MG DOSE,) 28 MG/DEVICE SOPK, Dispense X 3 (28 mg) devices for treatments every 7 days, administer intranasally, Disp: 3 each, Rfl: 3   esomeprazole (NEXIUM) 20 MG capsule, Take 20 mg by mouth once., Disp: , Rfl:    gabapentin  (NEURONTIN ) 300 MG capsule, Take 1 capsule (300 mg total) by mouth 3 (three) times daily. Take first dose 1 hour before arrival for surgery., Disp: 21 capsule, Rfl: 0   Levomilnacipran  HCl ER (FETZIMA ) 120 MG CP24, Take 1 capsule by mouth daily., Disp: 90 capsule, Rfl: 3   meclizine  (ANTIVERT ) 25 MG tablet, Take 1 tablet (25 mg total) by mouth as directed prior to treatment., Disp: 60 tablet, Rfl: 0   atovaquone -proguanil (MALARONE ) 250-100 MG TABS tablet, Take 1 tablet by mouth once daily begin 2 days prearrival,take daily during stay & continue for 7 days posttravel for malaria prevention, Disp: 24 tablet, Rfl: 0   azithromycin  (ZITHROMAX ) 500 MG tablet, For non bloody diarrhea,take 2 tabs on day 1, if resolved,stop med,If diarrhea persists 1 tab on day 2 & 3. For bloody diarrhea,2 tabs on day 1 & 1 tab on day 2 & 3, Disp: 4 tablet, Rfl: 0   celecoxib  (CELEBREX ) 200 MG capsule, Take 1 capsule by mouth single dose as directed. Take 1 hour prior to arrival for surgery, Disp: 1 capsule, Rfl: 0   Esketamine HCl, 56 MG Dose, (SPRAVATO , 56 MG DOSE,) 28 MG/DEVICE SOPK, Dispense X 2 (28mg ) devices, administer on day  1 intranasally as directed (Patient not taking: Reported on 02/17/2024), Disp: 4 each, Rfl: 0   fluocinonide  ointment (LIDEX ) 0.05 %, Apply as directed to skin twice a day, Disp: 30 g, Rfl: 0   hydrocortisone  2.5 % cream, Apply a small amount to affected area twice daily for 1-2 weeks as directed., Disp: 20 g, Rfl: 0   ketoconazole  (NIZORAL ) 2 % cream, Apply a small amount to affected area on left foot/toe once a day, Disp: 30  g, Rfl: 0   metFORMIN  (GLUCOPHAGE ) 500 MG tablet, Take 1 tablet (500 mg total) by mouth daily., Disp: 90 tablet, Rfl: 1   metFORMIN  (GLUCOPHAGE ) 500 MG tablet, Take 1 tablet (500 mg total) by mouth 2 (two) times daily., Disp: 180 tablet, Rfl: 1   metFORMIN  (GLUCOPHAGE ) 500 MG tablet, Take 1 tablet (500 mg total) by mouth 2 (two) times daily., Disp: 90 tablet, Rfl: 1   metFORMIN  (GLUCOPHAGE -XR) 500 MG 24 hr tablet, Take 1 tablet (500 mg total) by mouth daily. Please do not fill if not picked up within 30 days., Disp: 30 tablet, Rfl: 0   methocarbamol  (ROBAXIN ) 500 MG tablet, Take 1 tablet (500 mg total) by mouth every 6 (six) hours as needed for muscle spasms. (Patient not taking: Reported on 10/29/2023), Disp: 45 tablet, Rfl: 0   ondansetron  (ZOFRAN ) 4 MG tablet, Take 1 tablet  by mouth every 6 hours as needed for nausea, Disp: 15 tablet, Rfl: 0   ondansetron  (ZOFRAN ) 4 MG tablet, Take 1 tablet (4 mg total) by mouth every 6 (six) hours for nausea. TAKE 4 tablets 15 minutes prior to arrival for surgery, Disp: 30 tablet, Rfl: 0   ondansetron  (ZOFRAN -ODT) 4 MG disintegrating tablet, Dissolve 1 tablet (4 mg total) by mouth every 8 (eight) hours as needed for nausea or vomiting., Disp: 20 tablet, Rfl: 1   oxyCODONE  (OXY IR/ROXICODONE ) 5 MG immediate release tablet, Take 1-2 tablet by mouth every six to eight hours as needed for Post Op Pain. DO NOT EXCEED  6 tablets in 24 hours., Disp: 30 tablet, Rfl: 0   polyethylene glycol (MIRALAX / GLYCOLAX) packet, Take 17 g by mouth daily., Disp: , Rfl:    polyethylene glycol-electrolytes (NULYTELY) 420 g solution, Take 4,000 mLs by mouth Nightly., Disp: 4000 mL, Rfl: 0   Semaglutide -Weight Management (WEGOVY ) 2.4 MG/0.75ML SOAJ, Inject 2.4 mg into the skin once a week for weight loss., Disp: 3 mL, Rfl: 0   sulfamethoxazole -trimethoprim  (BACTRIM  DS) 800-160 MG tablet, Take 1 tablet by mouth 2 (two) times daily as directed. Start the day after surgery., Disp: 10 tablet,  Rfl: 0   SUMAtriptan  (IMITREX ) 50 MG tablet, TAKE 1 TABLET BY MOUTH AT ONSET OF HEADACHE, MAY REPEAT 1 DOSE AFTER 2 HOURS IF NEEDED, Disp: 10 tablet, Rfl: 5   SUMAtriptan  (IMITREX ) 50 MG tablet, Take 1 tablet by mouth at onset of headache, may repeat same dose once 2 hours later if needed, Disp: 10 tablet, Rfl: 5   SUMAtriptan  (IMITREX ) 50 MG tablet, Take 1 tablet (50 mg total) by mouth at the onset of headache. May repeat 1 dose after 2 hours if needed., Disp: 10 tablet, Rfl: 5   SUMAtriptan  (IMITREX ) 50 MG tablet, Take 1 tablet (50 mg total) by mouth at onset of headache,may repeat 1 dose after 2 hours if needed, 2 to 3 times a week, Disp: 9 tablet, Rfl: 6   SUMAtriptan  (IMITREX ) 50 MG tablet, Take 1 tablet (50 mg total) by mouth as needed at onset  of headache. May repeat 1 dose after 2 hours if needed. Only take 2-3 times a week., Disp: 9 tablet, Rfl: 6   terbinafine  (LAMISIL ) 250 MG tablet, Take 1 tablet (250 mg total) by mouth daily for Tinea, Disp: 14 tablet, Rfl: 0   traZODone  (DESYREL ) 50 MG tablet, Take 50 mg by mouth at bedtime., Disp: , Rfl:    traZODone  (DESYREL ) 50 MG tablet, TAKE 1 TO 2 TABLETS BY MOUTH AT BEDTIME, Disp: 180 tablet, Rfl: 1   traZODone  (DESYREL ) 50 MG tablet, Take 1-2 tablets (50-100 mg total) by mouth at bedtime., Disp: 180 tablet, Rfl: 1   WEGOVY  0.5 MG/0.5ML SOAJ, Inject 0.5 mg into the skin once a week for weight loss., Disp: 2 mL, Rfl: 0   WEGOVY  1 MG/0.5ML SOAJ, Inject 1 mg into the skin once a week for weight loss, Disp: 2 mL, Rfl: 0   WEGOVY  2.4 MG/0.75ML SOAJ, Inject 2.4 mg into the skin once a week for weight loss., Disp: 3 mL, Rfl: 0   WEGOVY  2.4 MG/0.75ML SOAJ, Inject 2.4 mg into the skin once a week fro weight loss, Disp: 3 mL, Rfl: 0   WEGOVY  2.4 MG/0.75ML SOAJ, Inject 2.4 mg into the skin once a week for weight loss, Disp: 3 mL, Rfl: 0   WEGOVY  2.4 MG/0.75ML SOAJ, Inject 2.4 mg,sub-q, into the skin once a week for weight loss, Disp: 3 mL, Rfl: 0   WEGOVY   2.4 MG/0.75ML SOAJ, Inject 2.4 mg into the skin once a week for weight loss., Disp: 3 mL, Rfl: 0   WEGOVY  2.4 MG/0.75ML SOAJ, Inject 2.4 mg into the skin once a week for weight loss., Disp: 3 mL, Rfl: 0   WEGOVY  2.4 MG/0.75ML SOAJ, Inject 2.4 mg into the skin once a week for weight loss, Disp: 3 mL, Rfl: 0 Medication Side Effects: none  Family Medical/ Social History: Changes?   No  MENTAL HEALTH EXAM:  There were no vitals taken for this visit.There is no height or weight on file to calculate BMI.  General Appearance: Casual and Well Groomed  Eye Contact:  Good  Speech:  Clear and Coherent and Normal Rate  Volume:  Normal  Mood:  Euthymic  Affect:  Congruent  Thought Process:  Goal Directed and Descriptions of Associations: Circumstantial  Orientation:  Full (Time, Place, and Person)  Thought Content: Logical   Suicidal Thoughts:  No  Homicidal Thoughts:  No  Memory:  WNL  Judgement:  Good  Insight:  Good  Psychomotor Activity:  Normal  Concentration:  Concentration: Good and Attention Span: Good  Recall:  Good  Fund of Knowledge: Good  Language: Good  Assets:  Communication Skills Desire for Improvement Financial Resources/Insurance Housing Leisure Time Resilience Transportation  ADL's:  Intact  Cognition: WNL  Prognosis:  Good   ECT-MADRS    Flowsheet Row Clinical Support from 01/23/2024 in [facility]  MADRS Total Score 18    Patient was administered Spravato  84 mg intranasally today.  The patient experienced the typical dissociation which gradually resolved over the 2-hour period of observation.  There were no complications.  Specifically the patient did not have nausea or vomiting or headache.  Blood pressures remained within normal ranges at the 40-minute and 2-hour follow-up intervals.  Pulse ox were nl. By the time the 2-hour observation period was met the patient was alert and oriented and able to exit without assistance.  Patient  feels the Spravato  administration is helpful for the treatment  resistant depression and would like to continue the treatment.  See nursing note for further details.  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 Generalized anxiety disorder  F41.1       Receiving Psychotherapy: No   RECOMMENDATIONS:   PDMP reviewed.  Spravato  filled 12/31/2023. I provided approximately 60 minutes of face to face time during this encounter, including time spent before and after the visit in records review, medical decision making, counseling pertinent to today's visit, and charting.   Spravato  is effective so no changes will be made.  Continue Xanax  0.5 mg, daily as needed. Continue Wellbutrin  XL 150 mg daily. Continue Vraylar  1.5 mg, 1 p.o. daily. Continue Spravato  weekly. Continue Fetzima  120 mg, 1 p.o. daily. Continue trazodone  50 mg, 1-2 nightly as needed sleep. Return in 1 week.  Verneita Cooks, PA-C

## 2024-02-17 ENCOUNTER — Encounter: Payer: Self-pay | Admitting: Physician Assistant

## 2024-02-19 ENCOUNTER — Ambulatory Visit

## 2024-02-19 ENCOUNTER — Encounter: Payer: Self-pay | Admitting: Physician Assistant

## 2024-02-19 ENCOUNTER — Ambulatory Visit (INDEPENDENT_AMBULATORY_CARE_PROVIDER_SITE_OTHER): Admitting: Physician Assistant

## 2024-02-19 VITALS — BP 146/85 | HR 66

## 2024-02-19 DIAGNOSIS — F339 Major depressive disorder, recurrent, unspecified: Secondary | ICD-10-CM

## 2024-02-19 DIAGNOSIS — N926 Irregular menstruation, unspecified: Secondary | ICD-10-CM | POA: Diagnosis not present

## 2024-02-19 DIAGNOSIS — N959 Unspecified menopausal and perimenopausal disorder: Secondary | ICD-10-CM | POA: Diagnosis not present

## 2024-02-19 DIAGNOSIS — F411 Generalized anxiety disorder: Secondary | ICD-10-CM

## 2024-02-19 NOTE — Progress Notes (Signed)
 [facility]  Patient ID: Robin Stevenson,  MRN: [id number]  PCP: Signa Rush, MD (Inactive)  Date of Evaluation: 02/19/2024 Time spent:55 minutes  Chief Complaint:  Chief Complaint   Depression; Other    HISTORY/CURRENT STATUS: HPI for Spravato  treatment, she sees Angeline Sayers, NP who is out of the office today.  Saw her GYN this morning who has started her on a birth control pill for HRT.  Hopefully that will help with mood and physical symptoms.  She will have to wait until she has a period before she can start it though.  Her GYN said if that does not help her mood enough maybe an increase in Wellbutrin  would.   Continues to respond to the Spravato .  Energy and motivation are good.  No extreme sadness, tearfulness, or feelings of hopelessness.  Sleeps ok.  ADLs and personal hygiene are normal.   No change in focus, concentration, or memory.  Appetite has not changed.  Weight is stable.   Occas anxiety, no PA.  No mania, psychosis, or delirium.  Denies suicidal or homicidal thoughts.  Denies dizziness, syncope, seizures, numbness, tingling, tremor, tics, unsteady gait, slurred speech, confusion. Denies muscle or joint pain, stiffness, or dystonia. Denies unexplained weight loss, frequent infections, or sores that heal slowly.  No polyphagia, polydipsia, or polyuria. Denies visual changes or paresthesias.   Individual Medical History/ Review of Systems: Changes? :No   Past medications for mental health diagnoses include: Spravato , Xanax , Wellbutrin , Vraylar , gabapentin , Fetzima , trazodone   Allergies: Cat dander and Brewers yeast  Current Medications:  Current Outpatient Medications:    ALPRAZolam  (XANAX ) 0.5 MG tablet, Take 1 tablet at bedtime the night before procedure. Take 1 tablet 1 hour before arrival. Bring third tablet with you to procedure., Disp: 3 tablet, Rfl: 0   atorvastatin  (LIPITOR) 20 MG tablet, Take 1 tablet (20 mg total) by mouth daily., Disp: 90  tablet, Rfl: 3   buPROPion  (WELLBUTRIN  XL) 150 MG 24 hr tablet, Take 1 tablet (150 mg total) by mouth every morning., Disp: 90 tablet, Rfl: 0   cariprazine  (VRAYLAR ) 1.5 MG capsule, Take 1 capsule (1.5 mg total) by mouth daily., Disp: 28 capsule, Rfl: 0   celecoxib  (CELEBREX ) 200 MG capsule, Take 1 capsule by mouth single dose as directed. Take 1 hour prior to arrival for surgery, Disp: 1 capsule, Rfl: 0   Esketamine HCl, 84 MG Dose, (SPRAVATO , 84 MG DOSE,) 28 MG/DEVICE SOPK, Dispense X 3 (28 mg) devices for treatments every 7 days, administer intranasally, Disp: 3 each, Rfl: 3   esomeprazole (NEXIUM) 20 MG capsule, Take 20 mg by mouth once., Disp: , Rfl:    gabapentin  (NEURONTIN ) 300 MG capsule, Take 1 capsule (300 mg total) by mouth 3 (three) times daily. Take first dose 1 hour before arrival for surgery., Disp: 21 capsule, Rfl: 0   Levomilnacipran  HCl ER (FETZIMA ) 120 MG CP24, Take 1 capsule by mouth daily., Disp: 90 capsule, Rfl: 3   meclizine  (ANTIVERT ) 25 MG tablet, Take 1 tablet (25 mg total) by mouth as directed prior to treatment., Disp: 60 tablet, Rfl: 0   SUMAtriptan  (IMITREX ) 50 MG tablet, Take 1 tablet (50 mg total) by mouth as needed at onset of headache. May repeat 1 dose after 2 hours if needed. Only take 2-3 times a week., Disp: 9 tablet, Rfl: 6   traZODone  (DESYREL ) 50 MG tablet, Take 50 mg by mouth at bedtime., Disp: , Rfl:    atovaquone -proguanil (MALARONE ) 250-100 MG TABS tablet, Take  1 tablet by mouth once daily begin 2 days prearrival,take daily during stay & continue for 7 days posttravel for malaria prevention, Disp: 24 tablet, Rfl: 0   azithromycin  (ZITHROMAX ) 500 MG tablet, For non bloody diarrhea,take 2 tabs on day 1, if resolved,stop med,If diarrhea persists 1 tab on day 2 & 3. For bloody diarrhea,2 tabs on day 1 & 1 tab on day 2 & 3, Disp: 4 tablet, Rfl: 0   Esketamine HCl, 56 MG Dose, (SPRAVATO , 56 MG DOSE,) 28 MG/DEVICE SOPK, Dispense X 2 (28mg ) devices, administer on day  1 intranasally as directed (Patient not taking: Reported on 02/17/2024), Disp: 4 each, Rfl: 0   fluocinonide  ointment (LIDEX ) 0.05 %, Apply as directed to skin twice a day, Disp: 30 g, Rfl: 0   hydrocortisone  2.5 % cream, Apply a small amount to affected area twice daily for 1-2 weeks as directed., Disp: 20 g, Rfl: 0   ketoconazole  (NIZORAL ) 2 % cream, Apply a small amount to affected area on left foot/toe once a day, Disp: 30 g, Rfl: 0   metFORMIN  (GLUCOPHAGE ) 500 MG tablet, Take 1 tablet (500 mg total) by mouth daily., Disp: 90 tablet, Rfl: 1   metFORMIN  (GLUCOPHAGE ) 500 MG tablet, Take 1 tablet (500 mg total) by mouth 2 (two) times daily., Disp: 180 tablet, Rfl: 1   metFORMIN  (GLUCOPHAGE ) 500 MG tablet, Take 1 tablet (500 mg total) by mouth 2 (two) times daily., Disp: 90 tablet, Rfl: 1   metFORMIN  (GLUCOPHAGE -XR) 500 MG 24 hr tablet, Take 1 tablet (500 mg total) by mouth daily. Please do not fill if not picked up within 30 days., Disp: 30 tablet, Rfl: 0   methocarbamol  (ROBAXIN ) 500 MG tablet, Take 1 tablet (500 mg total) by mouth every 6 (six) hours as needed for muscle spasms. (Patient not taking: Reported on 10/29/2023), Disp: 45 tablet, Rfl: 0   ondansetron  (ZOFRAN ) 4 MG tablet, Take 1 tablet  by mouth every 6 hours as needed for nausea, Disp: 15 tablet, Rfl: 0   ondansetron  (ZOFRAN ) 4 MG tablet, Take 1 tablet (4 mg total) by mouth every 6 (six) hours for nausea. TAKE 4 tablets 15 minutes prior to arrival for surgery, Disp: 30 tablet, Rfl: 0   ondansetron  (ZOFRAN -ODT) 4 MG disintegrating tablet, Dissolve 1 tablet (4 mg total) by mouth every 8 (eight) hours as needed for nausea or vomiting., Disp: 20 tablet, Rfl: 1   oxyCODONE  (OXY IR/ROXICODONE ) 5 MG immediate release tablet, Take 1-2 tablet by mouth every six to eight hours as needed for Post Op Pain. DO NOT EXCEED  6 tablets in 24 hours., Disp: 30 tablet, Rfl: 0   polyethylene glycol (MIRALAX / GLYCOLAX) packet, Take 17 g by mouth daily., Disp: ,  Rfl:    polyethylene glycol-electrolytes (NULYTELY) 420 g solution, Take 4,000 mLs by mouth Nightly., Disp: 4000 mL, Rfl: 0   Semaglutide -Weight Management (WEGOVY ) 2.4 MG/0.75ML SOAJ, Inject 2.4 mg into the skin once a week for weight loss., Disp: 3 mL, Rfl: 0   sulfamethoxazole -trimethoprim  (BACTRIM  DS) 800-160 MG tablet, Take 1 tablet by mouth 2 (two) times daily as directed. Start the day after surgery., Disp: 10 tablet, Rfl: 0   SUMAtriptan  (IMITREX ) 50 MG tablet, TAKE 1 TABLET BY MOUTH AT ONSET OF HEADACHE, MAY REPEAT 1 DOSE AFTER 2 HOURS IF NEEDED, Disp: 10 tablet, Rfl: 5   SUMAtriptan  (IMITREX ) 50 MG tablet, Take 1 tablet by mouth at onset of headache, may repeat same dose once 2 hours later if  needed, Disp: 10 tablet, Rfl: 5   SUMAtriptan  (IMITREX ) 50 MG tablet, Take 1 tablet (50 mg total) by mouth at the onset of headache. May repeat 1 dose after 2 hours if needed., Disp: 10 tablet, Rfl: 5   SUMAtriptan  (IMITREX ) 50 MG tablet, Take 1 tablet (50 mg total) by mouth at onset of headache,may repeat 1 dose after 2 hours if needed, 2 to 3 times a week, Disp: 9 tablet, Rfl: 6   terbinafine  (LAMISIL ) 250 MG tablet, Take 1 tablet (250 mg total) by mouth daily for Tinea, Disp: 14 tablet, Rfl: 0   traZODone  (DESYREL ) 50 MG tablet, TAKE 1 TO 2 TABLETS BY MOUTH AT BEDTIME, Disp: 180 tablet, Rfl: 1   traZODone  (DESYREL ) 50 MG tablet, Take 1-2 tablets (50-100 mg total) by mouth at bedtime., Disp: 180 tablet, Rfl: 1   WEGOVY  0.5 MG/0.5ML SOAJ, Inject 0.5 mg into the skin once a week for weight loss., Disp: 2 mL, Rfl: 0   WEGOVY  1 MG/0.5ML SOAJ, Inject 1 mg into the skin once a week for weight loss, Disp: 2 mL, Rfl: 0   WEGOVY  2.4 MG/0.75ML SOAJ, Inject 2.4 mg into the skin once a week for weight loss., Disp: 3 mL, Rfl: 0   WEGOVY  2.4 MG/0.75ML SOAJ, Inject 2.4 mg into the skin once a week fro weight loss, Disp: 3 mL, Rfl: 0   WEGOVY  2.4 MG/0.75ML SOAJ, Inject 2.4 mg into the skin once a week for weight  loss, Disp: 3 mL, Rfl: 0   WEGOVY  2.4 MG/0.75ML SOAJ, Inject 2.4 mg,sub-q, into the skin once a week for weight loss, Disp: 3 mL, Rfl: 0   WEGOVY  2.4 MG/0.75ML SOAJ, Inject 2.4 mg into the skin once a week for weight loss., Disp: 3 mL, Rfl: 0   WEGOVY  2.4 MG/0.75ML SOAJ, Inject 2.4 mg into the skin once a week for weight loss., Disp: 3 mL, Rfl: 0   WEGOVY  2.4 MG/0.75ML SOAJ, Inject 2.4 mg into the skin once a week for weight loss, Disp: 3 mL, Rfl: 0 Medication Side Effects: none  Family Medical/ Social History: Changes?   No  MENTAL HEALTH EXAM:  There were no vitals taken for this visit.There is no height or weight on file to calculate BMI.  General Appearance: Casual and Well Groomed  Eye Contact:  Good  Speech:  Clear and Coherent and Normal Rate  Volume:  Normal  Mood:  Euthymic  Affect:  Congruent  Thought Process:  Goal Directed and Descriptions of Associations: Circumstantial  Orientation:  Full (Time, Place, and Person)  Thought Content: Logical   Suicidal Thoughts:  No  Homicidal Thoughts:  No  Memory:  WNL  Judgement:  Good  Insight:  Good  Psychomotor Activity:  Normal  Concentration:  Concentration: Good and Attention Span: Good  Recall:  Good  Fund of Knowledge: Good  Language: Good  Assets:  Communication Skills Desire for Improvement Financial Resources/Insurance Housing Leisure Time Resilience Transportation Vocational/Educational  ADL's:  Intact  Cognition: WNL  Prognosis:  Good   ECT-MADRS    Flowsheet Row Clinical Support from 01/23/2024 in [facility]  MADRS Total Score 18    Patient was administered Spravato  84 mg intranasally today.  The patient experienced the typical dissociation which gradually resolved over the 2-hour period of observation.  There were no complications.  Specifically the patient did not have nausea or vomiting or headache.  Blood pressures remained within normal ranges at the 40-minute and 2-hour  follow-up intervals.  Pulse ox were nl. By the time the 2-hour observation period was met the patient was alert and oriented and able to exit without assistance.  Patient feels the Spravato  administration is helpful for the treatment resistant depression and would like to continue the treatment.  See nursing note for further details.  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 Generalized anxiety disorder  F41.1       Receiving Psychotherapy: No   RECOMMENDATIONS:   PDMP reviewed.  Spravato  filled 12/31/2023. I provided approximately 55 minutes of face to face time during this encounter, including time spent before and after the visit in records review, medical decision making, counseling pertinent to today's visit, and charting.   I am glad to hear that her GYN is starting HRT.  If that is not helpful enough for mood/depression the GYN suggested increasing the Wellbutrin .  We will keep that in mind.  For now no changes will be made.  Continue Xanax  0.5 mg, daily as needed. Continue Wellbutrin  XL 150 mg daily. Continue Vraylar  1.5 mg, 1 p.o. daily. Continue Spravato  weekly. Continue Fetzima  120 mg, 1 p.o. daily. Continue trazodone  50 mg, 1-2 nightly as needed sleep. Return in 1 week.  Verneita Cooks, PA-C

## 2024-02-19 NOTE — Progress Notes (Signed)
 NURSES NOTE:   Pt arrived for her # 42 Spravato  Treatment for treatment resistant depression, the starting dose was 56 mg (2 of the 28 mg nasal sprays) pt stayed at that dose for the first 4 treatments and also takes Zofran  and Meclizine  prior to arrival at office. Pt received 84 mg (3 of the 28 mg nasal sprays) total again today and did also premedicate. Pt is now using her medical benefits and receives Spravato  through [place] [facility], today is the first time using it. Pt sees Angeline Sayers, NP and she will follow her throughout her treatments and follow ups. Spravato  medication is stored at treatment center per REMS/FDA guidelines. The medication is required to be locked behind two doors per REMS/FDA protocol. Medication is also disposed of properly after each use per regulations. All documentation for REMS is completed and submitted per FDA/REMS requirements.          Pt directed to treatment room, started vitals at 10:00 AM, 135/94, pulse 97, SpO2 99%. Pt did take Zofran  4 mg and Meclizine  25 mg prior to arriving at office, an hour ahead. Instructed patient to blow her nose if needed then recline back to a 45 degree angle if that was more comfortable for her. Gave patient first dose 28 mg nasal spray, administered in each nostril as directed and observed by nurse, waited 5 more minutes for the second and third doses. After all doses given pt did not complain of nausea. pt keeps water and some mints to help with the taste of Spravato  it gives the metallic taste. Her 40 minute check was at 10:52 AM, vital signs were 144/91, pulse 69, SpO2 99%. Pt aware she would be monitored for a total time of 120 minutes.  Discharge vitals were taken at 12:07 PM 146/85 P 67, SpO2 99%. Pt met with Verneita Cooks, PA-C to discuss her treatment. Pt advised to relax the rest of the day. No driving, no intense activities. Verbalized understanding. Pt reports no issues today. Nurse was with pt a total of 60 minutes for clinical  assessment.  Pt instructed to call office with any problems or questions prior to next treatment. She is scheduled for 2 weeks on Juley 16th.     LOT [id number] EXP [id number]

## 2024-02-20 ENCOUNTER — Other Ambulatory Visit: Payer: Self-pay | Admitting: Adult Health

## 2024-02-20 ENCOUNTER — Other Ambulatory Visit (HOSPITAL_COMMUNITY): Payer: Self-pay

## 2024-02-20 ENCOUNTER — Other Ambulatory Visit: Payer: Self-pay

## 2024-02-20 DIAGNOSIS — F331 Major depressive disorder, recurrent, moderate: Secondary | ICD-10-CM

## 2024-02-20 MED ORDER — BUPROPION HCL ER (XL) 150 MG PO TB24
150.0000 mg | ORAL_TABLET | ORAL | 0 refills | Status: DC
Start: 2024-02-20 — End: 2024-04-02
  Filled 2024-02-20: qty 30, 30d supply, fill #0

## 2024-02-28 ENCOUNTER — Other Ambulatory Visit (HOSPITAL_COMMUNITY): Payer: Self-pay

## 2024-02-28 MED ORDER — MEDROXYPROGESTERONE ACETATE 10 MG PO TABS
10.0000 mg | ORAL_TABLET | Freq: Every day | ORAL | 1 refills | Status: DC
  Filled 2024-02-28: qty 7, 7d supply, fill #0

## 2024-02-28 MED ORDER — LO LOESTRIN FE 1 MG-10 MCG / 10 MCG PO TABS
1.0000 | ORAL_TABLET | Freq: Every day | ORAL | 4 refills | Status: DC
  Filled 2024-02-28: qty 28, 28d supply, fill #0
  Filled 2024-04-02: qty 28, 28d supply, fill #1
  Filled 2024-04-30: qty 28, 28d supply, fill #2
  Filled 2024-05-24: qty 28, 28d supply, fill #3
  Filled 2024-06-22: qty 28, 28d supply, fill #4

## 2024-03-05 ENCOUNTER — Ambulatory Visit

## 2024-03-05 ENCOUNTER — Ambulatory Visit: Admitting: Adult Health

## 2024-03-05 VITALS — BP 143/84 | HR 68

## 2024-03-05 DIAGNOSIS — F339 Major depressive disorder, recurrent, unspecified: Secondary | ICD-10-CM

## 2024-03-05 NOTE — Progress Notes (Signed)
 NURSES NOTE:   Pt arrived for her # 43 Spravato  Treatment for treatment resistant depression, the starting dose was 56 mg (2 of the 28 mg nasal sprays) pt stayed at that dose for the first 4 treatments and also takes Zofran  and Meclizine  prior to arrival at office. Pt received 84 mg (3 of the 28 mg nasal sprays) total again today and did also premedicate. Pt is now using her medical benefits and receives Spravato  through [facility] and [facility], today is the first time using it. Pt sees Angeline Sayers, NP and she will follow her throughout her treatments and follow ups. Spravato  medication is stored at treatment center per REMS/FDA guidelines. The medication is required to be locked behind two doors per REMS/FDA protocol. Medication is also disposed of properly after each use per regulations. All documentation for REMS is completed and submitted per FDA/REMS requirements.          Pt directed to treatment room, started vitals at 10:08 AM, 121/84, pulse 74, SpO2 97%. Pt did take Zofran  4 mg and Meclizine  25 mg prior to arriving at office, an hour ahead. Instructed patient to blow her nose if needed then recline back to a 45 degree angle if that was more comfortable for her. Gave patient first dose 28 mg nasal spray, administered in each nostril as directed and observed by nurse, waited 5 more minutes for the second and third doses. After all doses given pt did not complain of nausea. pt keeps water and some mints to help with the taste of Spravato  it gives the metallic taste. Her 40 minute check was at 11:00 AM, vital signs were 141/93, pulse 71, SpO2 99%. Pt aware she would be monitored for a total time of 120 minutes.  Discharge vitals were taken at 12:02 PM 143/84 P 68, SpO2 98%. Pt met with Angeline to discuss her treatment. Pt advised to relax the rest of the day. No driving, no intense activities. Verbalized understanding. Pt reports no issues today. Nurse was with pt a total of 60 minutes for clinical assessment.   Pt instructed to call office with any problems or questions prior to next treatment. She is scheduled for Monday, July 21st.     LOT [id number] EXP JAN 2028

## 2024-03-10 ENCOUNTER — Encounter: Payer: Self-pay | Admitting: Physician Assistant

## 2024-03-10 ENCOUNTER — Ambulatory Visit

## 2024-03-10 ENCOUNTER — Ambulatory Visit (INDEPENDENT_AMBULATORY_CARE_PROVIDER_SITE_OTHER): Admitting: Physician Assistant

## 2024-03-10 VITALS — BP 142/83 | HR 74

## 2024-03-10 DIAGNOSIS — F411 Generalized anxiety disorder: Secondary | ICD-10-CM

## 2024-03-10 DIAGNOSIS — F339 Major depressive disorder, recurrent, unspecified: Secondary | ICD-10-CM | POA: Diagnosis not present

## 2024-03-10 NOTE — Progress Notes (Signed)
 NURSES NOTE:   Pt arrived for her # 44 Spravato  Treatment for treatment resistant depression, the starting dose was 56 mg (2 of the 28 mg nasal sprays) pt stayed at that dose for the first 4 treatments and also takes Zofran  and Meclizine  prior to arrival at office. Pt received 84 mg (3 of the 28 mg nasal sprays) total again today and did also premedicate. Pt is now using her medical benefits and receives Spravato  through [facility], today is the first time using it. Pt sees Angeline Sayers, NP and she will follow her throughout her treatments and follow ups. Spravato  medication is stored at treatment center per REMS/FDA guidelines. The medication is required to be locked behind two doors per REMS/FDA protocol. Medication is also disposed of properly after each use per regulations. All documentation for REMS is completed and submitted per FDA/REMS requirements.          Pt directed to treatment room, started vitals at 9:40 AM, 112/84, pulse 80, SpO2 98%. Pt did take Zofran  4 mg and Meclizine  25 mg prior to arriving at office, an hour ahead. Instructed patient to blow her nose if needed then recline back to a 45 degree angle if that was more comfortable for her. Gave patient first dose 28 mg nasal spray, administered in each nostril as directed and observed by nurse, waited 5 more minutes for the second and third doses. After all doses given pt did not complain of nausea. pt keeps water and some mints to help with the taste of Spravato  it gives the metallic taste. Her 40 minute check was at 10:20 AM, vital signs were 139/82, pulse 87, SpO2 98%. Pt aware she would be monitored for a total time of 120 minutes.  Discharge vitals were taken at 11:33 PM 142/83 P 74, SpO2 99%. Pt met with Verneita Cooks, PA-C to discuss her treatment. Pt advised to relax the rest of the day. No driving, no intense activities. Verbalized understanding. Pt reports no issues today. Nurse was with pt a total of 60 minutes for clinical  assessment.  Pt instructed to call office with any problems or questions prior to next treatment. She is scheduled for Wednesday, July 30th.     LOT [id number] EXP JAN 2028

## 2024-03-10 NOTE — Progress Notes (Signed)
 [facility]  Patient ID: Robin Stevenson,  MRN: [id number]  PCP: Signa Rush, MD (Inactive)  Date of Evaluation: 03/10/2024 Time spent:60 minutes  Chief Complaint:  Chief Complaint   Depression; Other    HISTORY/CURRENT STATUS: HPI for Spravato  treatment, she sees Angeline Sayers, NP who is out of the office today.  Robin Stevenson is doing well. Spravato  is still effective. Stable as far as interest and motivation goes, no reports of extreme sadness.  Sleeps ok. ADLs and personal hygiene are normal.  Appetite has not changed.  Weight is stable.  No c/o anxiety.  No mania, delirium, AH/VH.  No SI/HI.  Denies dizziness, syncope, seizures, numbness, tingling, tremor, tics, unsteady gait, slurred speech, confusion. Denies muscle or joint pain, stiffness, or dystonia.   Individual Medical History/ Review of Systems: Changes? :No   Past medications for mental health diagnoses include: Spravato , Xanax , Wellbutrin , Vraylar , gabapentin , Fetzima , trazodone   Allergies: Cat dander and Brewers yeast  Current Medications:  Current Outpatient Medications:    ALPRAZolam  (XANAX ) 0.5 MG tablet, Take 1 tablet at bedtime the night before procedure. Take 1 tablet 1 hour before arrival. Bring third tablet with you to procedure., Disp: 3 tablet, Rfl: 0   atorvastatin  (LIPITOR) 20 MG tablet, Take 1 tablet (20 mg total) by mouth daily., Disp: 90 tablet, Rfl: 3   atovaquone -proguanil (MALARONE ) 250-100 MG TABS tablet, Take 1 tablet by mouth once daily begin 2 days prearrival,take daily during stay & continue for 7 days posttravel for malaria prevention, Disp: 24 tablet, Rfl: 0   azithromycin  (ZITHROMAX ) 500 MG tablet, For non bloody diarrhea,take 2 tabs on day 1, if resolved,stop med,If diarrhea persists 1 tab on day 2 & 3. For bloody diarrhea,2 tabs on day 1 & 1 tab on day 2 & 3, Disp: 4 tablet, Rfl: 0   buPROPion  (WELLBUTRIN  XL) 150 MG 24 hr tablet, Take 1 tablet (150 mg total) by mouth every  morning., Disp: 30 tablet, Rfl: 0   cariprazine  (VRAYLAR ) 1.5 MG capsule, Take 1 capsule (1.5 mg total) by mouth daily., Disp: 28 capsule, Rfl: 0   celecoxib  (CELEBREX ) 200 MG capsule, Take 1 capsule by mouth single dose as directed. Take 1 hour prior to arrival for surgery, Disp: 1 capsule, Rfl: 0   Esketamine HCl, 56 MG Dose, (SPRAVATO , 56 MG DOSE,) 28 MG/DEVICE SOPK, Dispense X 2 (28mg ) devices, administer on day 1 intranasally as directed (Patient not taking: Reported on 02/17/2024), Disp: 4 each, Rfl: 0   Esketamine HCl, 84 MG Dose, (SPRAVATO , 84 MG DOSE,) 28 MG/DEVICE SOPK, Dispense X 3 (28 mg) devices for treatments every 7 days, administer intranasally, Disp: 3 each, Rfl: 3   esomeprazole (NEXIUM) 20 MG capsule, Take 20 mg by mouth once., Disp: , Rfl:    fluocinonide  ointment (LIDEX ) 0.05 %, Apply as directed to skin twice a day, Disp: 30 g, Rfl: 0   gabapentin  (NEURONTIN ) 300 MG capsule, Take 1 capsule (300 mg total) by mouth 3 (three) times daily. Take first dose 1 hour before arrival for surgery., Disp: 21 capsule, Rfl: 0   hydrocortisone  2.5 % cream, Apply a small amount to affected area twice daily for 1-2 weeks as directed., Disp: 20 g, Rfl: 0   ketoconazole  (NIZORAL ) 2 % cream, Apply a small amount to affected area on left foot/toe once a day, Disp: 30 g, Rfl: 0   Levomilnacipran  HCl ER (FETZIMA ) 120 MG CP24, Take 1 capsule by mouth daily., Disp: 90 capsule, Rfl: 3   meclizine  (  ANTIVERT ) 25 MG tablet, Take 1 tablet (25 mg total) by mouth as directed prior to treatment., Disp: 60 tablet, Rfl: 0   medroxyPROGESTERone  (PROVERA ) 10 MG tablet, Take 1 tablet (10 mg total) by mouth at bedtime for 7 days., Disp: 7 tablet, Rfl: 1   metFORMIN  (GLUCOPHAGE ) 500 MG tablet, Take 1 tablet (500 mg total) by mouth daily., Disp: 90 tablet, Rfl: 1   metFORMIN  (GLUCOPHAGE ) 500 MG tablet, Take 1 tablet (500 mg total) by mouth 2 (two) times daily., Disp: 180 tablet, Rfl: 1   metFORMIN  (GLUCOPHAGE ) 500 MG  tablet, Take 1 tablet (500 mg total) by mouth 2 (two) times daily., Disp: 90 tablet, Rfl: 1   metFORMIN  (GLUCOPHAGE -XR) 500 MG 24 hr tablet, Take 1 tablet (500 mg total) by mouth daily. Please do not fill if not picked up within 30 days., Disp: 30 tablet, Rfl: 0   methocarbamol  (ROBAXIN ) 500 MG tablet, Take 1 tablet (500 mg total) by mouth every 6 (six) hours as needed for muscle spasms. (Patient not taking: Reported on 10/29/2023), Disp: 45 tablet, Rfl: 0   Norethindrone-Ethinyl Estradiol -Fe Biphas (LO LOESTRIN FE ) 1 MG-10 MCG / 10 MCG tablet, Take 1 tablet by mouth daily., Disp: 28 tablet, Rfl: 4   ondansetron  (ZOFRAN ) 4 MG tablet, Take 1 tablet  by mouth every 6 hours as needed for nausea, Disp: 15 tablet, Rfl: 0   ondansetron  (ZOFRAN ) 4 MG tablet, Take 1 tablet (4 mg total) by mouth every 6 (six) hours for nausea. TAKE 4 tablets 15 minutes prior to arrival for surgery, Disp: 30 tablet, Rfl: 0   ondansetron  (ZOFRAN -ODT) 4 MG disintegrating tablet, Dissolve 1 tablet (4 mg total) by mouth every 8 (eight) hours as needed for nausea or vomiting., Disp: 20 tablet, Rfl: 1   oxyCODONE  (OXY IR/ROXICODONE ) 5 MG immediate release tablet, Take 1-2 tablet by mouth every six to eight hours as needed for Post Op Pain. DO NOT EXCEED  6 tablets in 24 hours., Disp: 30 tablet, Rfl: 0   polyethylene glycol (MIRALAX / GLYCOLAX) packet, Take 17 g by mouth daily., Disp: , Rfl:    polyethylene glycol-electrolytes (NULYTELY) 420 g solution, Take 4,000 mLs by mouth Nightly., Disp: 4000 mL, Rfl: 0   Semaglutide -Weight Management (WEGOVY ) 2.4 MG/0.75ML SOAJ, Inject 2.4 mg into the skin once a week for weight loss., Disp: 3 mL, Rfl: 0   sulfamethoxazole -trimethoprim  (BACTRIM  DS) 800-160 MG tablet, Take 1 tablet by mouth 2 (two) times daily as directed. Start the day after surgery., Disp: 10 tablet, Rfl: 0   SUMAtriptan  (IMITREX ) 50 MG tablet, TAKE 1 TABLET BY MOUTH AT ONSET OF HEADACHE, MAY REPEAT 1 DOSE AFTER 2 HOURS IF NEEDED,  Disp: 10 tablet, Rfl: 5   SUMAtriptan  (IMITREX ) 50 MG tablet, Take 1 tablet by mouth at onset of headache, may repeat same dose once 2 hours later if needed, Disp: 10 tablet, Rfl: 5   SUMAtriptan  (IMITREX ) 50 MG tablet, Take 1 tablet (50 mg total) by mouth at the onset of headache. May repeat 1 dose after 2 hours if needed., Disp: 10 tablet, Rfl: 5   SUMAtriptan  (IMITREX ) 50 MG tablet, Take 1 tablet (50 mg total) by mouth at onset of headache,may repeat 1 dose after 2 hours if needed, 2 to 3 times a week, Disp: 9 tablet, Rfl: 6   SUMAtriptan  (IMITREX ) 50 MG tablet, Take 1 tablet (50 mg total) by mouth as needed at onset of headache. May repeat 1 dose after 2 hours if needed. Only  take 2-3 times a week., Disp: 9 tablet, Rfl: 6   terbinafine  (LAMISIL ) 250 MG tablet, Take 1 tablet (250 mg total) by mouth daily for Tinea, Disp: 14 tablet, Rfl: 0   traZODone  (DESYREL ) 50 MG tablet, Take 50 mg by mouth at bedtime., Disp: , Rfl:    traZODone  (DESYREL ) 50 MG tablet, TAKE 1 TO 2 TABLETS BY MOUTH AT BEDTIME, Disp: 180 tablet, Rfl: 1   traZODone  (DESYREL ) 50 MG tablet, Take 1-2 tablets (50-100 mg total) by mouth at bedtime., Disp: 180 tablet, Rfl: 1   WEGOVY  0.5 MG/0.5ML SOAJ, Inject 0.5 mg into the skin once a week for weight loss., Disp: 2 mL, Rfl: 0   WEGOVY  1 MG/0.5ML SOAJ, Inject 1 mg into the skin once a week for weight loss, Disp: 2 mL, Rfl: 0   WEGOVY  2.4 MG/0.75ML SOAJ, Inject 2.4 mg into the skin once a week for weight loss., Disp: 3 mL, Rfl: 0   WEGOVY  2.4 MG/0.75ML SOAJ, Inject 2.4 mg into the skin once a week fro weight loss, Disp: 3 mL, Rfl: 0   WEGOVY  2.4 MG/0.75ML SOAJ, Inject 2.4 mg into the skin once a week for weight loss, Disp: 3 mL, Rfl: 0   WEGOVY  2.4 MG/0.75ML SOAJ, Inject 2.4 mg,sub-q, into the skin once a week for weight loss, Disp: 3 mL, Rfl: 0   WEGOVY  2.4 MG/0.75ML SOAJ, Inject 2.4 mg into the skin once a week for weight loss., Disp: 3 mL, Rfl: 0   WEGOVY  2.4 MG/0.75ML SOAJ, Inject  2.4 mg into the skin once a week for weight loss., Disp: 3 mL, Rfl: 0   WEGOVY  2.4 MG/0.75ML SOAJ, Inject 2.4 mg into the skin once a week for weight loss, Disp: 3 mL, Rfl: 0 Medication Side Effects: none  Family Medical/ Social History: Changes?   No  MENTAL HEALTH EXAM:  There were no vitals taken for this visit.There is no height or weight on file to calculate BMI.  General Appearance: Casual and Well Groomed  Eye Contact:  Good  Speech:  Clear and Coherent and Normal Rate  Volume:  Normal  Mood:  Euthymic  Affect:  Congruent  Thought Process:  Goal Directed and Descriptions of Associations: Circumstantial  Orientation:  Full (Time, Place, and Person)  Thought Content: Logical   Suicidal Thoughts:  No  Homicidal Thoughts:  No  Memory:  WNL  Judgement:  Good  Insight:  Good  Psychomotor Activity:  Normal  Concentration:  Concentration: Good and Attention Span: Good  Recall:  Good  Fund of Knowledge: Good  Language: Good  Assets:  Communication Skills Desire for Improvement Financial Resources/Insurance Housing Leisure Time Resilience Social Support Transportation Vocational/Educational  ADL's:  Intact  Cognition: WNL  Prognosis:  Good   ECT-MADRS    Flowsheet Row Clinical Support from 01/23/2024 in [facility]  MADRS Total Score 18    Patient was administered Spravato  84 mg intranasally today.  The patient experienced the typical dissociation which gradually resolved over the 2-hour period of observation.  There were no complications.  Specifically the patient did not have nausea or vomiting or headache.  Blood pressures remained within normal ranges at the 40-minute and 2-hour follow-up intervals.  Pulse ox were nl. By the time the 2-hour observation period was met the patient was alert and oriented and able to exit without assistance.  Patient feels the Spravato  administration is helpful for the treatment resistant depression and would  like to continue the treatment.  See nursing note for further details.  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 Generalized anxiety disorder  F41.1       Receiving Psychotherapy: No   RECOMMENDATIONS:   PDMP reviewed.  Spravato  filled 12/31/2023. I provided approximately 60 minutes of face to face time during this encounter, including time spent before and after the visit in records review, medical decision making, counseling pertinent to today's visit, and charting.   She's doing well so no changes will be made.   Continue Xanax  0.5 mg, daily as needed. Continue Wellbutrin  XL 150 mg daily. Continue Vraylar  1.5 mg, 1 p.o. daily. Continue Spravato  weekly. Continue Fetzima  120 mg, 1 p.o. daily. Continue trazodone  50 mg, 1-2 nightly as needed sleep. Return in 1 week.  Verneita Cooks, PA-C

## 2024-03-17 ENCOUNTER — Other Ambulatory Visit (HOSPITAL_COMMUNITY): Payer: Self-pay

## 2024-03-17 ENCOUNTER — Other Ambulatory Visit: Payer: Self-pay

## 2024-03-18 ENCOUNTER — Other Ambulatory Visit: Payer: Self-pay

## 2024-03-19 ENCOUNTER — Encounter: Payer: Self-pay | Admitting: Physician Assistant

## 2024-03-19 ENCOUNTER — Ambulatory Visit

## 2024-03-19 ENCOUNTER — Ambulatory Visit (INDEPENDENT_AMBULATORY_CARE_PROVIDER_SITE_OTHER): Admitting: Physician Assistant

## 2024-03-19 VITALS — BP 134/84 | HR 71

## 2024-03-19 DIAGNOSIS — F339 Major depressive disorder, recurrent, unspecified: Secondary | ICD-10-CM

## 2024-03-19 DIAGNOSIS — F411 Generalized anxiety disorder: Secondary | ICD-10-CM

## 2024-03-19 NOTE — Progress Notes (Signed)
 [facility]  Patient ID: Robin Stevenson,  MRN: [id number]  PCP: Signa Rush, MD (Inactive)  Date of Evaluation: 03/19/2024 Time spent:65 minutes  Chief Complaint:  Chief Complaint   Depression; Other    HISTORY/CURRENT STATUS: HPI for Spravato  treatment, she sees Angeline Sayers, NP who is out of the office today.  She still feels that the Spravato  is effective. Started OCP/HRT a few days ago.  Hopefully that will take care of any mood issues related to hormones. Energy and motivation are good.   No extreme sadness, tearfulness, or feelings of hopelessness.  Sleeps well most of the time.  She did not sleep great last night for some reason but she usually does.  ADLs and personal hygiene are normal.   Denies any changes in concentration, making decisions, or remembering things.  Appetite has not changed.  Weight is stable.  Gets anxious at times but well controlled with Xanax  when needed.  No mania, delirium, AH/VH.  No SI/HI.  Denies dizziness, syncope, seizures, numbness, tingling, tremor, tics, unsteady gait, slurred speech, confusion. Denies muscle or joint pain, stiffness, or dystonia.  Individual Medical History/ Review of Systems: Changes? :No   Past medications for mental health diagnoses include: Spravato , Xanax , Wellbutrin , Vraylar , gabapentin , Fetzima , trazodone   Allergies: Cat dander and Brewers yeast  Current Medications:  Current Outpatient Medications:    ALPRAZolam  (XANAX ) 0.5 MG tablet, Take 1 tablet at bedtime the night before procedure. Take 1 tablet 1 hour before arrival. Bring third tablet with you to procedure., Disp: 3 tablet, Rfl: 0   atorvastatin  (LIPITOR) 20 MG tablet, Take 1 tablet (20 mg total) by mouth daily., Disp: 90 tablet, Rfl: 3   azithromycin  (ZITHROMAX ) 500 MG tablet, For non bloody diarrhea,take 2 tabs on day 1, if resolved,stop med,If diarrhea persists 1 tab on day 2 & 3. For bloody diarrhea,2 tabs on day 1 & 1 tab on day 2 & 3,  Disp: 4 tablet, Rfl: 0   buPROPion  (WELLBUTRIN  XL) 150 MG 24 hr tablet, Take 1 tablet (150 mg total) by mouth every morning., Disp: 30 tablet, Rfl: 0   cariprazine  (VRAYLAR ) 1.5 MG capsule, Take 1 capsule (1.5 mg total) by mouth daily., Disp: 28 capsule, Rfl: 0   celecoxib  (CELEBREX ) 200 MG capsule, Take 1 capsule by mouth single dose as directed. Take 1 hour prior to arrival for surgery, Disp: 1 capsule, Rfl: 0   Esketamine HCl, 84 MG Dose, (SPRAVATO , 84 MG DOSE,) 28 MG/DEVICE SOPK, Dispense X 3 (28 mg) devices for treatments every 7 days, administer intranasally, Disp: 3 each, Rfl: 3   esomeprazole (NEXIUM) 20 MG capsule, Take 20 mg by mouth once., Disp: , Rfl:    fluocinonide  ointment (LIDEX ) 0.05 %, Apply as directed to skin twice a day, Disp: 30 g, Rfl: 0   gabapentin  (NEURONTIN ) 300 MG capsule, Take 1 capsule (300 mg total) by mouth 3 (three) times daily. Take first dose 1 hour before arrival for surgery., Disp: 21 capsule, Rfl: 0   Levomilnacipran  HCl ER (FETZIMA ) 120 MG CP24, Take 1 capsule by mouth daily., Disp: 90 capsule, Rfl: 3   meclizine  (ANTIVERT ) 25 MG tablet, Take 1 tablet (25 mg total) by mouth as directed prior to treatment., Disp: 60 tablet, Rfl: 0   metFORMIN  (GLUCOPHAGE -XR) 500 MG 24 hr tablet, Take 1 tablet (500 mg total) by mouth daily. Please do not fill if not picked up within 30 days., Disp: 30 tablet, Rfl: 0   Norethindrone-Ethinyl Estradiol -Fe Biphas (LO LOESTRIN   FE) 1 MG-10 MCG / 10 MCG tablet, Take 1 tablet by mouth daily., Disp: 28 tablet, Rfl: 4   ondansetron  (ZOFRAN -ODT) 4 MG disintegrating tablet, Dissolve 1 tablet (4 mg total) by mouth every 8 (eight) hours as needed for nausea or vomiting., Disp: 20 tablet, Rfl: 1   SUMAtriptan  (IMITREX ) 50 MG tablet, Take 1 tablet by mouth at onset of headache, may repeat same dose once 2 hours later if needed, Disp: 10 tablet, Rfl: 5   traZODone  (DESYREL ) 50 MG tablet, Take 1-2 tablets (50-100 mg total) by mouth at bedtime., Disp:  180 tablet, Rfl: 1   atovaquone -proguanil (MALARONE ) 250-100 MG TABS tablet, Take 1 tablet by mouth once daily begin 2 days prearrival,take daily during stay & continue for 7 days posttravel for malaria prevention, Disp: 24 tablet, Rfl: 0   Esketamine HCl, 56 MG Dose, (SPRAVATO , 56 MG DOSE,) 28 MG/DEVICE SOPK, Dispense X 2 (28mg ) devices, administer on day 1 intranasally as directed (Patient not taking: Reported on 02/17/2024), Disp: 4 each, Rfl: 0   hydrocortisone  2.5 % cream, Apply a small amount to affected area twice daily for 1-2 weeks as directed., Disp: 20 g, Rfl: 0   ketoconazole  (NIZORAL ) 2 % cream, Apply a small amount to affected area on left foot/toe once a day, Disp: 30 g, Rfl: 0   metFORMIN  (GLUCOPHAGE ) 500 MG tablet, Take 1 tablet (500 mg total) by mouth daily., Disp: 90 tablet, Rfl: 1   metFORMIN  (GLUCOPHAGE ) 500 MG tablet, Take 1 tablet (500 mg total) by mouth 2 (two) times daily., Disp: 180 tablet, Rfl: 1   metFORMIN  (GLUCOPHAGE ) 500 MG tablet, Take 1 tablet (500 mg total) by mouth 2 (two) times daily., Disp: 90 tablet, Rfl: 1   methocarbamol  (ROBAXIN ) 500 MG tablet, Take 1 tablet (500 mg total) by mouth every 6 (six) hours as needed for muscle spasms. (Patient not taking: Reported on 10/29/2023), Disp: 45 tablet, Rfl: 0   ondansetron  (ZOFRAN ) 4 MG tablet, Take 1 tablet  by mouth every 6 hours as needed for nausea, Disp: 15 tablet, Rfl: 0   ondansetron  (ZOFRAN ) 4 MG tablet, Take 1 tablet (4 mg total) by mouth every 6 (six) hours for nausea. TAKE 4 tablets 15 minutes prior to arrival for surgery, Disp: 30 tablet, Rfl: 0   oxyCODONE  (OXY IR/ROXICODONE ) 5 MG immediate release tablet, Take 1-2 tablet by mouth every six to eight hours as needed for Post Op Pain. DO NOT EXCEED  6 tablets in 24 hours., Disp: 30 tablet, Rfl: 0   polyethylene glycol (MIRALAX / GLYCOLAX) packet, Take 17 g by mouth daily., Disp: , Rfl:    polyethylene glycol-electrolytes (NULYTELY) 420 g solution, Take 4,000 mLs by  mouth Nightly., Disp: 4000 mL, Rfl: 0   Semaglutide -Weight Management (WEGOVY ) 2.4 MG/0.75ML SOAJ, Inject 2.4 mg into the skin once a week for weight loss., Disp: 3 mL, Rfl: 0   sulfamethoxazole -trimethoprim  (BACTRIM  DS) 800-160 MG tablet, Take 1 tablet by mouth 2 (two) times daily as directed. Start the day after surgery., Disp: 10 tablet, Rfl: 0   SUMAtriptan  (IMITREX ) 50 MG tablet, TAKE 1 TABLET BY MOUTH AT ONSET OF HEADACHE, MAY REPEAT 1 DOSE AFTER 2 HOURS IF NEEDED, Disp: 10 tablet, Rfl: 5   SUMAtriptan  (IMITREX ) 50 MG tablet, Take 1 tablet (50 mg total) by mouth at the onset of headache. May repeat 1 dose after 2 hours if needed., Disp: 10 tablet, Rfl: 5   SUMAtriptan  (IMITREX ) 50 MG tablet, Take 1 tablet (50 mg  total) by mouth at onset of headache,may repeat 1 dose after 2 hours if needed, 2 to 3 times a week, Disp: 9 tablet, Rfl: 6   SUMAtriptan  (IMITREX ) 50 MG tablet, Take 1 tablet (50 mg total) by mouth as needed at onset of headache. May repeat 1 dose after 2 hours if needed. Only take 2-3 times a week., Disp: 9 tablet, Rfl: 6   terbinafine  (LAMISIL ) 250 MG tablet, Take 1 tablet (250 mg total) by mouth daily for Tinea, Disp: 14 tablet, Rfl: 0   traZODone  (DESYREL ) 50 MG tablet, Take 50 mg by mouth at bedtime., Disp: , Rfl:    traZODone  (DESYREL ) 50 MG tablet, TAKE 1 TO 2 TABLETS BY MOUTH AT BEDTIME, Disp: 180 tablet, Rfl: 1   WEGOVY  0.5 MG/0.5ML SOAJ, Inject 0.5 mg into the skin once a week for weight loss., Disp: 2 mL, Rfl: 0   WEGOVY  1 MG/0.5ML SOAJ, Inject 1 mg into the skin once a week for weight loss, Disp: 2 mL, Rfl: 0   WEGOVY  2.4 MG/0.75ML SOAJ, Inject 2.4 mg into the skin once a week for weight loss., Disp: 3 mL, Rfl: 0   WEGOVY  2.4 MG/0.75ML SOAJ, Inject 2.4 mg into the skin once a week fro weight loss, Disp: 3 mL, Rfl: 0   WEGOVY  2.4 MG/0.75ML SOAJ, Inject 2.4 mg into the skin once a week for weight loss, Disp: 3 mL, Rfl: 0   WEGOVY  2.4 MG/0.75ML SOAJ, Inject 2.4 mg,sub-q, into  the skin once a week for weight loss, Disp: 3 mL, Rfl: 0   WEGOVY  2.4 MG/0.75ML SOAJ, Inject 2.4 mg into the skin once a week for weight loss., Disp: 3 mL, Rfl: 0   WEGOVY  2.4 MG/0.75ML SOAJ, Inject 2.4 mg into the skin once a week for weight loss., Disp: 3 mL, Rfl: 0   WEGOVY  2.4 MG/0.75ML SOAJ, Inject 2.4 mg into the skin once a week for weight loss, Disp: 3 mL, Rfl: 0 Medication Side Effects: none  Family Medical/ Social History: Changes?   No  MENTAL HEALTH EXAM:  There were no vitals taken for this visit.There is no height or weight on file to calculate BMI.  General Appearance: Casual and Well Groomed  Eye Contact:  Good  Speech:  Clear and Coherent and Normal Rate  Volume:  Normal  Mood:  Euthymic  Affect:  Congruent  Thought Process:  Goal Directed and Descriptions of Associations: Circumstantial  Orientation:  Full (Time, Place, and Person)  Thought Content: Logical   Suicidal Thoughts:  No  Homicidal Thoughts:  No  Memory:  WNL  Judgement:  Good  Insight:  Good  Psychomotor Activity:  Normal  Concentration:  Concentration: Good and Attention Span: Good  Recall:  Good  Fund of Knowledge: Good  Language: Good  Assets:  Communication Skills Desire for Improvement Financial Resources/Insurance Housing Leisure Time Resilience Social Support Transportation Vocational/Educational  ADL's:  Intact  Cognition: WNL  Prognosis:  Good   ECT-MADRS    Flowsheet Row Clinical Support from 01/23/2024 in [facility]  MADRS Total Score 18    Patient was administered Spravato  84 mg intranasally today.  The patient experienced the typical dissociation which gradually resolved over the 2-hour period of observation.  There were no complications.  Specifically the patient did not have nausea or vomiting or headache.  Blood pressures remained within normal ranges at the 40-minute and 2-hour follow-up intervals.  Pulse ox were nl. By the time the 2-hour  observation  period was met the patient was alert and oriented and able to exit without assistance.  Patient feels the Spravato  administration is helpful for the treatment resistant depression and would like to continue the treatment.  See nursing note for further details.  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 Generalized anxiety disorder  F41.1       Receiving Psychotherapy: No   RECOMMENDATIONS:   PDMP reviewed.  Spravato  filled 12/31/2023. I provided approximately  65 minutes of face to face time during this encounter, including time spent before and after the visit in records review, medical decision making, counseling pertinent to today's visit, and charting.   She is doing well with the Spravato  so no changes need to be made.  Continue Xanax  0.5 mg, daily as needed. Continue Wellbutrin  XL 150 mg daily. Continue Vraylar  1.5 mg, 1 p.o. daily. Continue Spravato  weekly. Continue Fetzima  120 mg, 1 p.o. daily. Continue trazodone  50 mg, 1-2 nightly as needed sleep. Return in 1 week.  Verneita Cooks, PA-C

## 2024-03-19 NOTE — Progress Notes (Signed)
 NURSES NOTE:   Pt arrived for her # 45 Spravato  Treatment for treatment resistant depression, the starting dose was 56 mg (2 of the 28 mg nasal sprays) pt stayed at that dose for the first 4 treatments and also takes Zofran  and Meclizine  prior to arrival at office. Pt received 84 mg (3 of the 28 mg nasal sprays) total again today and did also premedicate. Pt is now using her medical benefits and receives Spravato  through [facility], today is the first time using it. Pt sees Angeline Sayers, NP and she will follow her throughout her treatments and follow ups. Spravato  medication is stored at treatment center per REMS/FDA guidelines. The medication is required to be locked behind two doors per REMS/FDA protocol. Medication is also disposed of properly after each use per regulations. All documentation for REMS is completed and submitted per FDA/REMS requirements.          Pt directed to treatment room, started vitals at 10:10 AM, 164/81, pulse 76, SpO2 98%. Pt did take Zofran  4 mg and Meclizine  25 mg prior to arriving at office, an hour ahead. Instructed patient to blow her nose if needed then recline back to a 45 degree angle if that was more comfortable for her. Gave patient first dose 28 mg nasal spray, administered in each nostril as directed and observed by nurse, waited 5 more minutes for the second and third doses. After all doses given pt did not complain of nausea. pt keeps water and some mints to help with the taste of Spravato  it gives the metallic taste. Her 40 minute check was at 10:55 AM, vital signs were 146/83, pulse 71, SpO2 97%. Pt aware she would be monitored for a total time of 120 minutes.  Discharge vitals were taken at 12:02 PM 134/84 P 71, SpO2 98%. Pt met with Verneita Cooks, PA-C to discuss her treatment. Pt advised to relax the rest of the day. No driving, no intense activities. Verbalized understanding. Pt reports no issues today. Nurse was with pt a total of 60 minutes for clinical  assessment.  Pt instructed to call office with any problems or questions prior to next treatment. She is scheduled for next Wednesday, August 6th.      LOT [id number] EXP JAN [id number]

## 2024-03-20 ENCOUNTER — Telehealth: Admitting: Adult Health

## 2024-03-20 ENCOUNTER — Encounter: Payer: Self-pay | Admitting: Adult Health

## 2024-03-20 DIAGNOSIS — F339 Major depressive disorder, recurrent, unspecified: Secondary | ICD-10-CM | POA: Diagnosis not present

## 2024-03-20 DIAGNOSIS — F411 Generalized anxiety disorder: Secondary | ICD-10-CM | POA: Diagnosis not present

## 2024-03-20 NOTE — Progress Notes (Signed)
 Robin Stevenson [id number] 1976-06-30 48 y.o.  Virtual Visit via Video Note  I connected with pt @ on 03/20/24 at  1:30 PM EDT by a video enabled telemedicine application and verified that I am speaking with the correct person using two identifiers.   I discussed the limitations of evaluation and management by telemedicine and the availability of in person appointments. The patient expressed understanding and agreed to proceed.  I discussed the assessment and treatment plan with the patient. The patient was provided an opportunity to ask questions and all were answered. The patient agreed with the plan and demonstrated an understanding of the instructions.   The patient was advised to call back or seek an in-person evaluation if the symptoms worsen or if the condition fails to improve as anticipated.  I provided 15 minutes of non-face-to-face time during this encounter.  The patient was located at home.  The provider was located at [facility].   Angeline LOISE Sayers, NP   Subjective:   Patient ID:  Robin Stevenson is a 48 y.o. (DOB Nov 09, 1975) female.  Chief Complaint: No chief complaint on file.   HPI ALESIA OSHIELDS presents for follow-up of TRD, MDD and GAD.  Describes mood today as ok. Pleasant. Denies tearfulness. Mood symptoms - reports anxiety, depression and irritability. Reports lower interest and motivation. Denies panic attacks. Reports worry, rumination and over thinking. Denies obsessive thoughts and acts. Denies irrational thoughts and fears. Reports mood as lower. Stating I'm not feeling too good. Taking other medications as prescribed.   Energy levels stable. Active, walking 2 to 3 miles a day. Enjoys some usual interests and activities. Married. Lives with husband - daughter. Spending time with family Appetite adequate. Weight gain. Sleeps well most nights. Averages 8 hours. Focus and concentration stable. Completing tasks. Managing aspects of  household.  Denies SI or HI.  Denies AH or VH. Denies self harm. Denies substance use.  Previous medication trials:  Prozac, Lexapro, Zoloft, Wellbutrin , Pristiq, Fetzima  x 3 years, and  Aplenzin ,    Review of Systems:  Review of Systems  Musculoskeletal:  Negative for gait problem.  Neurological:  Negative for tremors.  Psychiatric/Behavioral:         Please refer to HPI    Medications: I have reviewed the patient's current medications.  Current Outpatient Medications  Medication Sig Dispense Refill   ALPRAZolam  (XANAX ) 0.5 MG tablet Take 1 tablet at bedtime the night before procedure. Take 1 tablet 1 hour before arrival. Bring third tablet with you to procedure. 3 tablet 0   atorvastatin  (LIPITOR) 20 MG tablet Take 1 tablet (20 mg total) by mouth daily. 90 tablet 3   atovaquone -proguanil (MALARONE ) 250-100 MG TABS tablet Take 1 tablet by mouth once daily begin 2 days prearrival,take daily during stay & continue for 7 days posttravel for malaria prevention 24 tablet 0   azithromycin  (ZITHROMAX ) 500 MG tablet For non bloody diarrhea,take 2 tabs on day 1, if resolved,stop med,If diarrhea persists 1 tab on day 2 & 3. For bloody diarrhea,2 tabs on day 1 & 1 tab on day 2 & 3 4 tablet 0   buPROPion  (WELLBUTRIN  XL) 150 MG 24 hr tablet Take 1 tablet (150 mg total) by mouth every morning. 30 tablet 0   cariprazine  (VRAYLAR ) 1.5 MG capsule Take 1 capsule (1.5 mg total) by mouth daily. 28 capsule 0   celecoxib  (CELEBREX ) 200 MG capsule Take 1 capsule by mouth single dose as directed. Take 1 hour prior to  arrival for surgery 1 capsule 0   Esketamine HCl, 56 MG Dose, (SPRAVATO , 56 MG DOSE,) 28 MG/DEVICE SOPK Dispense X 2 (28mg ) devices, administer on day 1 intranasally as directed (Patient not taking: Reported on 02/17/2024) 4 each 0   Esketamine HCl, 84 MG Dose, (SPRAVATO , 84 MG DOSE,) 28 MG/DEVICE SOPK Dispense X 3 (28 mg) devices for treatments every 7 days, administer intranasally 3 each 3    esomeprazole (NEXIUM) 20 MG capsule Take 20 mg by mouth once.     fluocinonide  ointment (LIDEX ) 0.05 % Apply as directed to skin twice a day 30 g 0   gabapentin  (NEURONTIN ) 300 MG capsule Take 1 capsule (300 mg total) by mouth 3 (three) times daily. Take first dose 1 hour before arrival for surgery. 21 capsule 0   hydrocortisone  2.5 % cream Apply a small amount to affected area twice daily for 1-2 weeks as directed. 20 g 0   ketoconazole  (NIZORAL ) 2 % cream Apply a small amount to affected area on left foot/toe once a day 30 g 0   Levomilnacipran  HCl ER (FETZIMA ) 120 MG CP24 Take 1 capsule by mouth daily. 90 capsule 3   meclizine  (ANTIVERT ) 25 MG tablet Take 1 tablet (25 mg total) by mouth as directed prior to treatment. 60 tablet 0   metFORMIN  (GLUCOPHAGE ) 500 MG tablet Take 1 tablet (500 mg total) by mouth daily. 90 tablet 1   metFORMIN  (GLUCOPHAGE ) 500 MG tablet Take 1 tablet (500 mg total) by mouth 2 (two) times daily. 180 tablet 1   metFORMIN  (GLUCOPHAGE ) 500 MG tablet Take 1 tablet (500 mg total) by mouth 2 (two) times daily. 90 tablet 1   metFORMIN  (GLUCOPHAGE -XR) 500 MG 24 hr tablet Take 1 tablet (500 mg total) by mouth daily. Please do not fill if not picked up within 30 days. 30 tablet 0   methocarbamol  (ROBAXIN ) 500 MG tablet Take 1 tablet (500 mg total) by mouth every 6 (six) hours as needed for muscle spasms. (Patient not taking: Reported on 10/29/2023) 45 tablet 0   Norethindrone-Ethinyl Estradiol -Fe Biphas (LO LOESTRIN FE ) 1 MG-10 MCG / 10 MCG tablet Take 1 tablet by mouth daily. 28 tablet 4   ondansetron  (ZOFRAN ) 4 MG tablet Take 1 tablet  by mouth every 6 hours as needed for nausea 15 tablet 0   ondansetron  (ZOFRAN ) 4 MG tablet Take 1 tablet (4 mg total) by mouth every 6 (six) hours for nausea. TAKE 4 tablets 15 minutes prior to arrival for surgery 30 tablet 0   ondansetron  (ZOFRAN -ODT) 4 MG disintegrating tablet Dissolve 1 tablet (4 mg total) by mouth every 8 (eight) hours as needed  for nausea or vomiting. 20 tablet 1   oxyCODONE  (OXY IR/ROXICODONE ) 5 MG immediate release tablet Take 1-2 tablet by mouth every six to eight hours as needed for Post Op Pain. DO NOT EXCEED  6 tablets in 24 hours. 30 tablet 0   polyethylene glycol (MIRALAX / GLYCOLAX) packet Take 17 g by mouth daily.     polyethylene glycol-electrolytes (NULYTELY) 420 g solution Take 4,000 mLs by mouth Nightly. 4000 mL 0   Semaglutide -Weight Management (WEGOVY ) 2.4 MG/0.75ML SOAJ Inject 2.4 mg into the skin once a week for weight loss. 3 mL 0   sulfamethoxazole -trimethoprim  (BACTRIM  DS) 800-160 MG tablet Take 1 tablet by mouth 2 (two) times daily as directed. Start the day after surgery. 10 tablet 0   SUMAtriptan  (IMITREX ) 50 MG tablet TAKE 1 TABLET BY MOUTH AT ONSET OF HEADACHE, MAY  REPEAT 1 DOSE AFTER 2 HOURS IF NEEDED 10 tablet 5   SUMAtriptan  (IMITREX ) 50 MG tablet Take 1 tablet by mouth at onset of headache, may repeat same dose once 2 hours later if needed 10 tablet 5   SUMAtriptan  (IMITREX ) 50 MG tablet Take 1 tablet (50 mg total) by mouth at the onset of headache. May repeat 1 dose after 2 hours if needed. 10 tablet 5   SUMAtriptan  (IMITREX ) 50 MG tablet Take 1 tablet (50 mg total) by mouth at onset of headache,may repeat 1 dose after 2 hours if needed, 2 to 3 times a week 9 tablet 6   SUMAtriptan  (IMITREX ) 50 MG tablet Take 1 tablet (50 mg total) by mouth as needed at onset of headache. May repeat 1 dose after 2 hours if needed. Only take 2-3 times a week. 9 tablet 6   terbinafine  (LAMISIL ) 250 MG tablet Take 1 tablet (250 mg total) by mouth daily for Tinea 14 tablet 0   traZODone  (DESYREL ) 50 MG tablet Take 50 mg by mouth at bedtime.     traZODone  (DESYREL ) 50 MG tablet TAKE 1 TO 2 TABLETS BY MOUTH AT BEDTIME 180 tablet 1   traZODone  (DESYREL ) 50 MG tablet Take 1-2 tablets (50-100 mg total) by mouth at bedtime. 180 tablet 1   WEGOVY  0.5 MG/0.5ML SOAJ Inject 0.5 mg into the skin once a week for weight loss. 2  mL 0   WEGOVY  1 MG/0.5ML SOAJ Inject 1 mg into the skin once a week for weight loss 2 mL 0   WEGOVY  2.4 MG/0.75ML SOAJ Inject 2.4 mg into the skin once a week for weight loss. 3 mL 0   WEGOVY  2.4 MG/0.75ML SOAJ Inject 2.4 mg into the skin once a week fro weight loss 3 mL 0   WEGOVY  2.4 MG/0.75ML SOAJ Inject 2.4 mg into the skin once a week for weight loss 3 mL 0   WEGOVY  2.4 MG/0.75ML SOAJ Inject 2.4 mg,sub-q, into the skin once a week for weight loss 3 mL 0   WEGOVY  2.4 MG/0.75ML SOAJ Inject 2.4 mg into the skin once a week for weight loss. 3 mL 0   WEGOVY  2.4 MG/0.75ML SOAJ Inject 2.4 mg into the skin once a week for weight loss. 3 mL 0   WEGOVY  2.4 MG/0.75ML SOAJ Inject 2.4 mg into the skin once a week for weight loss 3 mL 0   No current facility-administered medications for this visit.    Medication Side Effects: None  Allergies:  Allergies  Allergen Reactions   Cat Dander Anaphylaxis   Brewers Yeast Rash    Past Medical History:  Diagnosis Date   Depression    takes Welbutrin and Fetzima    GERD (gastroesophageal reflux disease)    takes nexium    No family history on file.  Social History   Socioeconomic History   Marital status: Married    Spouse name: Not on file   Number of children: Not on file   Years of education: Not on file   Highest education level: Not on file  Occupational History   Not on file  Tobacco Use   Smoking status: Never   Smokeless tobacco: Never  Substance and Sexual Activity   Alcohol use: Yes    Alcohol/week: 5.0 standard drinks of alcohol    Types: 5 Glasses of wine per week   Drug use: No   Sexual activity: Yes  Other Topics Concern   Not on file  Social History  Narrative   Not on file   Social Drivers of Health   Financial Resource Strain: Not on file  Food Insecurity: Not on file  Transportation Needs: Not on file  Physical Activity: Not on file  Stress: Not on file  Social Connections: Not on file  Intimate Partner  Violence: Not on file    Past Medical History, Surgical history, Social history, and Family history were reviewed and updated as appropriate.   Please see review of systems for further details on the patient's review from today.   Objective:   Physical Exam:  There were no vitals taken for this visit.  Physical Exam Constitutional:      General: She is not in acute distress. Musculoskeletal:        General: No deformity.  Neurological:     Mental Status: She is alert and oriented to person, place, and time.     Coordination: Coordination normal.  Psychiatric:        Attention and Perception: Attention and perception normal. She does not perceive auditory or visual hallucinations.        Mood and Affect: Mood normal. Mood is not anxious or depressed. Affect is not labile, blunt, angry or inappropriate.        Speech: Speech normal.        Behavior: Behavior normal.        Thought Content: Thought content normal. Thought content is not paranoid or delusional. Thought content does not include homicidal or suicidal ideation. Thought content does not include homicidal or suicidal plan.        Cognition and Memory: Cognition and memory normal.        Judgment: Judgment normal.     Comments: Insight intact     Lab Review:     Component Value Date/Time   NA 140 09/01/2016 0852   K 4.4 09/01/2016 0852   CL 104 09/01/2016 0852   CO2 31 09/01/2016 0852   GLUCOSE 90 09/01/2016 0852   BUN 14 09/01/2016 0852   CREATININE 0.86 09/01/2016 0852   CALCIUM  9.2 09/01/2016 0852   PROT 7.0 09/01/2016 0852   ALBUMIN 4.3 09/01/2016 0852   AST 33 09/01/2016 0852   ALT 70 (H) 09/01/2016 0852   ALKPHOS 66 09/01/2016 0852   BILITOT 0.7 09/01/2016 0852       Component Value Date/Time   WBC 5.1 09/01/2016 0852   RBC 4.30 09/01/2016 0852   HGB 13.5 09/01/2016 0852   HCT 38.9 09/01/2016 0852   PLT 286.0 09/01/2016 0852   MCV 90.4 09/01/2016 0852   MCHC 34.6 09/01/2016 0852   RDW 11.9  09/01/2016 0852   LYMPHSABS 1.5 09/01/2016 0852   MONOABS 0.4 09/01/2016 0852   EOSABS 0.1 09/01/2016 0852   BASOSABS 0.0 09/01/2016 0852    No results found for: POCLITH, LITHIUM   No results found for: PHENYTOIN, PHENOBARB, VALPROATE, CBMZ   .res Assessment: Plan:    Plan:  PDMP reviewed  Continue Spravato   Xanax  0.5 mg, daily as needed. Wellbutrin  XL 150 mg daily. Vraylar  1.5 mg, 1 p.o. daily. Fetzima  120 mg, 1 p.o. daily. Trazodone  50 mg, 1-2 nightly as needed sleep.  Patient seen for 30 minutes and time spent discussing treatment options.  RTC 3 months  Patient advised to contact office with any questions, adverse effects, or acute worsening in signs and symptoms.     Diagnoses and all orders for this visit:  Recurrent major depression resistant to treatment [facility])  Generalized anxiety  disorder     Please see After Visit Summary for patient specific instructions.  Future Appointments  Date Time Provider Department Center  03/26/2024 10:00 AM Rhys Boyer T, PA-C [facility] None  03/26/2024 10:00 AM CP-NURSE [facility] None  04/15/2024 10:20 AM [facility]    No orders of the defined types were placed in this encounter.     -------------------------------

## 2024-03-25 DIAGNOSIS — Z124 Encounter for screening for malignant neoplasm of cervix: Secondary | ICD-10-CM | POA: Diagnosis not present

## 2024-03-25 DIAGNOSIS — Z01419 Encounter for gynecological examination (general) (routine) without abnormal findings: Secondary | ICD-10-CM | POA: Diagnosis not present

## 2024-03-25 DIAGNOSIS — Z6825 Body mass index (BMI) 25.0-25.9, adult: Secondary | ICD-10-CM | POA: Diagnosis not present

## 2024-03-25 DIAGNOSIS — Z1151 Encounter for screening for human papillomavirus (HPV): Secondary | ICD-10-CM | POA: Diagnosis not present

## 2024-03-26 ENCOUNTER — Encounter: Payer: Self-pay | Admitting: Physician Assistant

## 2024-03-26 ENCOUNTER — Ambulatory Visit

## 2024-03-26 ENCOUNTER — Ambulatory Visit (INDEPENDENT_AMBULATORY_CARE_PROVIDER_SITE_OTHER): Admitting: Physician Assistant

## 2024-03-26 VITALS — BP 129/83 | HR 76

## 2024-03-26 DIAGNOSIS — F339 Major depressive disorder, recurrent, unspecified: Secondary | ICD-10-CM

## 2024-03-26 DIAGNOSIS — F411 Generalized anxiety disorder: Secondary | ICD-10-CM

## 2024-03-26 NOTE — Progress Notes (Signed)
 [facility]  Patient ID: KIIRA BRACH,  MRN: [id number]  PCP: Signa Rush, MD (Inactive)  Date of Evaluation: 03/26/2024 Time spent:60 minutes  Chief Complaint:  Chief Complaint   Depression; Other    HISTORY/CURRENT STATUS: HPI for Spravato  treatment, she sees Angeline Sayers, NP who is out of the office today.  [name] is doing well with the Spravato  and other meds. No anhdonia. No extreme sadness, tearfulness, or feelings of hopelessness.  Sleeps well most of the time. ADLs and personal hygiene are normal.   No change in memory.  Appetite has not changed.  Weight is stable.  No mania, delirium, AH/VH.  No SI/HI.  Denies dizziness, syncope, seizures, numbness, tingling, tremor, tics, unsteady gait, slurred speech, confusion. Denies muscle or joint pain, stiffness, or dystonia. Denies unexplained weight loss, frequent infections, or sores that heal slowly.  No polyphagia, polydipsia, or polyuria. Denies visual changes or paresthesias.   Individual Medical History/ Review of Systems: Changes? :No   Past medications for mental health diagnoses include: Spravato , Xanax , Wellbutrin , Vraylar , gabapentin , Fetzima , trazodone   Allergies: Cat dander and Brewers yeast  Current Medications:  Current Outpatient Medications:    ALPRAZolam  (XANAX ) 0.5 MG tablet, Take 1 tablet at bedtime the night before procedure. Take 1 tablet 1 hour before arrival. Bring third tablet with you to procedure., Disp: 3 tablet, Rfl: 0   atorvastatin  (LIPITOR) 20 MG tablet, Take 1 tablet (20 mg total) by mouth daily., Disp: 90 tablet, Rfl: 3   atovaquone -proguanil (MALARONE ) 250-100 MG TABS tablet, Take 1 tablet by mouth once daily begin 2 days prearrival,take daily during stay & continue for 7 days posttravel for malaria prevention, Disp: 24 tablet, Rfl: 0   azithromycin  (ZITHROMAX ) 500 MG tablet, For non bloody diarrhea,take 2 tabs on day 1, if resolved,stop med,If diarrhea persists 1 tab on day 2 &  3. For bloody diarrhea,2 tabs on day 1 & 1 tab on day 2 & 3, Disp: 4 tablet, Rfl: 0   buPROPion  (WELLBUTRIN  XL) 150 MG 24 hr tablet, Take 1 tablet (150 mg total) by mouth every morning., Disp: 30 tablet, Rfl: 0   cariprazine  (VRAYLAR ) 1.5 MG capsule, Take 1 capsule (1.5 mg total) by mouth daily., Disp: 28 capsule, Rfl: 0   celecoxib  (CELEBREX ) 200 MG capsule, Take 1 capsule by mouth single dose as directed. Take 1 hour prior to arrival for surgery, Disp: 1 capsule, Rfl: 0   Esketamine HCl, 56 MG Dose, (SPRAVATO , 56 MG DOSE,) 28 MG/DEVICE SOPK, Dispense X 2 (28mg ) devices, administer on day 1 intranasally as directed (Patient not taking: Reported on 02/17/2024), Disp: 4 each, Rfl: 0   Esketamine HCl, 84 MG Dose, (SPRAVATO , 84 MG DOSE,) 28 MG/DEVICE SOPK, Dispense X 3 (28 mg) devices for treatments every 7 days, administer intranasally, Disp: 3 each, Rfl: 3   esomeprazole (NEXIUM) 20 MG capsule, Take 20 mg by mouth once., Disp: , Rfl:    fluocinonide  ointment (LIDEX ) 0.05 %, Apply as directed to skin twice a day, Disp: 30 g, Rfl: 0   gabapentin  (NEURONTIN ) 300 MG capsule, Take 1 capsule (300 mg total) by mouth 3 (three) times daily. Take first dose 1 hour before arrival for surgery., Disp: 21 capsule, Rfl: 0   hydrocortisone  2.5 % cream, Apply a small amount to affected area twice daily for 1-2 weeks as directed., Disp: 20 g, Rfl: 0   ketoconazole  (NIZORAL ) 2 % cream, Apply a small amount to affected area on left foot/toe once a day, Disp:  30 g, Rfl: 0   Levomilnacipran  HCl ER (FETZIMA ) 120 MG CP24, Take 1 capsule by mouth daily., Disp: 90 capsule, Rfl: 3   meclizine  (ANTIVERT ) 25 MG tablet, Take 1 tablet (25 mg total) by mouth as directed prior to treatment., Disp: 60 tablet, Rfl: 0   metFORMIN  (GLUCOPHAGE ) 500 MG tablet, Take 1 tablet (500 mg total) by mouth daily., Disp: 90 tablet, Rfl: 1   metFORMIN  (GLUCOPHAGE ) 500 MG tablet, Take 1 tablet (500 mg total) by mouth 2 (two) times daily., Disp: 180  tablet, Rfl: 1   metFORMIN  (GLUCOPHAGE ) 500 MG tablet, Take 1 tablet (500 mg total) by mouth 2 (two) times daily., Disp: 90 tablet, Rfl: 1   metFORMIN  (GLUCOPHAGE -XR) 500 MG 24 hr tablet, Take 1 tablet (500 mg total) by mouth daily. Please do not fill if not picked up within 30 days., Disp: 30 tablet, Rfl: 0   methocarbamol  (ROBAXIN ) 500 MG tablet, Take 1 tablet (500 mg total) by mouth every 6 (six) hours as needed for muscle spasms. (Patient not taking: Reported on 10/29/2023), Disp: 45 tablet, Rfl: 0   Norethindrone-Ethinyl Estradiol -Fe Biphas (LO LOESTRIN FE ) 1 MG-10 MCG / 10 MCG tablet, Take 1 tablet by mouth daily., Disp: 28 tablet, Rfl: 4   ondansetron  (ZOFRAN ) 4 MG tablet, Take 1 tablet  by mouth every 6 hours as needed for nausea, Disp: 15 tablet, Rfl: 0   ondansetron  (ZOFRAN ) 4 MG tablet, Take 1 tablet (4 mg total) by mouth every 6 (six) hours for nausea. TAKE 4 tablets 15 minutes prior to arrival for surgery, Disp: 30 tablet, Rfl: 0   ondansetron  (ZOFRAN -ODT) 4 MG disintegrating tablet, Dissolve 1 tablet (4 mg total) by mouth every 8 (eight) hours as needed for nausea or vomiting., Disp: 20 tablet, Rfl: 1   oxyCODONE  (OXY IR/ROXICODONE ) 5 MG immediate release tablet, Take 1-2 tablet by mouth every six to eight hours as needed for Post Op Pain. DO NOT EXCEED  6 tablets in 24 hours., Disp: 30 tablet, Rfl: 0   polyethylene glycol (MIRALAX / GLYCOLAX) packet, Take 17 g by mouth daily., Disp: , Rfl:    polyethylene glycol-electrolytes (NULYTELY) 420 g solution, Take 4,000 mLs by mouth Nightly., Disp: 4000 mL, Rfl: 0   Semaglutide -Weight Management (WEGOVY ) 2.4 MG/0.75ML SOAJ, Inject 2.4 mg into the skin once a week for weight loss., Disp: 3 mL, Rfl: 0   sulfamethoxazole -trimethoprim  (BACTRIM  DS) 800-160 MG tablet, Take 1 tablet by mouth 2 (two) times daily as directed. Start the day after surgery., Disp: 10 tablet, Rfl: 0   SUMAtriptan  (IMITREX ) 50 MG tablet, TAKE 1 TABLET BY MOUTH AT ONSET OF  HEADACHE, MAY REPEAT 1 DOSE AFTER 2 HOURS IF NEEDED, Disp: 10 tablet, Rfl: 5   SUMAtriptan  (IMITREX ) 50 MG tablet, Take 1 tablet by mouth at onset of headache, may repeat same dose once 2 hours later if needed, Disp: 10 tablet, Rfl: 5   SUMAtriptan  (IMITREX ) 50 MG tablet, Take 1 tablet (50 mg total) by mouth at the onset of headache. May repeat 1 dose after 2 hours if needed., Disp: 10 tablet, Rfl: 5   SUMAtriptan  (IMITREX ) 50 MG tablet, Take 1 tablet (50 mg total) by mouth at onset of headache,may repeat 1 dose after 2 hours if needed, 2 to 3 times a week, Disp: 9 tablet, Rfl: 6   SUMAtriptan  (IMITREX ) 50 MG tablet, Take 1 tablet (50 mg total) by mouth as needed at onset of headache. May repeat 1 dose after 2 hours if  needed. Only take 2-3 times a week., Disp: 9 tablet, Rfl: 6   terbinafine  (LAMISIL ) 250 MG tablet, Take 1 tablet (250 mg total) by mouth daily for Tinea, Disp: 14 tablet, Rfl: 0   traZODone  (DESYREL ) 50 MG tablet, Take 50 mg by mouth at bedtime., Disp: , Rfl:    traZODone  (DESYREL ) 50 MG tablet, TAKE 1 TO 2 TABLETS BY MOUTH AT BEDTIME, Disp: 180 tablet, Rfl: 1   traZODone  (DESYREL ) 50 MG tablet, Take 1-2 tablets (50-100 mg total) by mouth at bedtime., Disp: 180 tablet, Rfl: 1   WEGOVY  0.5 MG/0.5ML SOAJ, Inject 0.5 mg into the skin once a week for weight loss., Disp: 2 mL, Rfl: 0   WEGOVY  1 MG/0.5ML SOAJ, Inject 1 mg into the skin once a week for weight loss, Disp: 2 mL, Rfl: 0   WEGOVY  2.4 MG/0.75ML SOAJ, Inject 2.4 mg into the skin once a week for weight loss., Disp: 3 mL, Rfl: 0   WEGOVY  2.4 MG/0.75ML SOAJ, Inject 2.4 mg into the skin once a week fro weight loss, Disp: 3 mL, Rfl: 0   WEGOVY  2.4 MG/0.75ML SOAJ, Inject 2.4 mg into the skin once a week for weight loss, Disp: 3 mL, Rfl: 0   WEGOVY  2.4 MG/0.75ML SOAJ, Inject 2.4 mg,sub-q, into the skin once a week for weight loss, Disp: 3 mL, Rfl: 0   WEGOVY  2.4 MG/0.75ML SOAJ, Inject 2.4 mg into the skin once a week for weight loss.,  Disp: 3 mL, Rfl: 0   WEGOVY  2.4 MG/0.75ML SOAJ, Inject 2.4 mg into the skin once a week for weight loss., Disp: 3 mL, Rfl: 0   WEGOVY  2.4 MG/0.75ML SOAJ, Inject 2.4 mg into the skin once a week for weight loss, Disp: 3 mL, Rfl: 0 Medication Side Effects: none  Family Medical/ Social History: Changes?   No  MENTAL HEALTH EXAM:  There were no vitals taken for this visit.There is no height or weight on file to calculate BMI.  General Appearance: Casual and Well Groomed  Eye Contact:  Good  Speech:  Clear and Coherent and Normal Rate  Volume:  Normal  Mood:  Euthymic  Affect:  Congruent  Thought Process:  Goal Directed and Descriptions of Associations: Circumstantial  Orientation:  Full (Time, Place, and Person)  Thought Content: Logical   Suicidal Thoughts:  No  Homicidal Thoughts:  No  Memory:  WNL  Judgement:  Good  Insight:  Good  Psychomotor Activity:  Normal  Concentration:  Concentration: Good and Attention Span: Good  Recall:  Good  Fund of Knowledge: Good  Language: Good  Assets:  Communication Skills Desire for Improvement Financial Resources/Insurance Housing Leisure Time Physical Health Resilience Social Support Transportation Vocational/Educational  ADL's:  Intact  Cognition: WNL  Prognosis:  Good   ECT-MADRS    Flowsheet Row Video Visit from 03/20/2024 in [facility] Clinical Support from 01/23/2024 in [facility]  MADRS Total Score 20 18   PHQ, 3  Patient was administered Spravato  84 mg intranasally today.  The patient experienced the typical dissociation which gradually resolved over the 2-hour period of observation.  There were no complications.  Specifically the patient did not have nausea or vomiting or headache.  Blood pressures remained within normal ranges at the 40-minute and 2-hour follow-up intervals.  Pulse ox were nl. By the time the 2-hour observation period was met the patient was alert  and oriented and able to exit without assistance.  Patient feels  the Spravato  administration is helpful for the treatment resistant depression and would like to continue the treatment.  See nursing note for further details.  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 Generalized anxiety disorder  F41.1      Receiving Psychotherapy: No   RECOMMENDATIONS:   PDMP reviewed.  Spravato  filled 12/31/2023. I provided approximately 60 minutes of face to face time during this encounter, including time spent before and after the visit in records review, medical decision making, counseling pertinent to today's visit, and charting.   [name] continues to respond to the Spravato .  No changes.   Continue Xanax  0.5 mg, daily as needed. Continue Wellbutrin  XL 150 mg daily. Continue Vraylar  1.5 mg, 1 p.o. daily. Continue Spravato  weekly. Continue Fetzima  120 mg, 1 p.o. daily. Continue trazodone  50 mg, 1-2 nightly as needed sleep. Return in 1 week.  Verneita Cooks, PA-C

## 2024-03-26 NOTE — Progress Notes (Signed)
 NURSES NOTE:   Pt arrived for her # 46 Spravato  Treatment for treatment resistant depression, the starting dose was 56 mg (2 of the 28 mg nasal sprays) pt stayed at that dose for the first 4 treatments and also takes Zofran  and Meclizine  prior to arrival at office. Pt received 84 mg (3 of the 28 mg nasal sprays) total again today and did also premedicate. Pt is now using her medical benefits and receives Spravato  through [facility], today is the first time using it. Pt sees Angeline Sayers, NP and she will follow her throughout her treatments and follow ups. Spravato  medication is stored at treatment center per REMS/FDA guidelines. The medication is required to be locked behind two doors per REMS/FDA protocol. Medication is also disposed of properly after each use per regulations. All documentation for REMS is completed and submitted per FDA/REMS requirements.          Pt directed to treatment room, started vitals at 10:08 AM, 113/84, pulse 75, SpO2 99%. Pt did take Zofran  4 mg and Meclizine  25 mg prior to arriving at office, an hour ahead. Instructed patient to blow her nose if needed then recline back to a 45 degree angle if that was more comfortable for her. Gave patient first dose 28 mg nasal spray, administered in each nostril as directed and observed by nurse, waited 5 more minutes for the second and third doses. After all doses given pt did not complain of nausea. pt keeps water and some mints to help with the taste of Spravato  it gives the metallic taste. Her 40 minute check was at 10:55 AM, vital signs were 132/87, pulse 74, SpO2 98%. Pt aware she would be monitored for a total time of 120 minutes.  Discharge vitals were taken at 11:55 AM 129/83 P 76, SpO2 98%. Pt met with Verneita Cooks, PA-C to discuss her treatment. Pt advised to relax the rest of the day. No driving, no intense activities. Verbalized understanding. Pt reports no issues today. Nurse was with pt a total of 60 minutes for clinical  assessment.  Pt instructed to call office with any problems or questions prior to next treatment. She is scheduled for next Thursday, August 14th.      LOT [id number] EXP JAN 2028

## 2024-03-31 ENCOUNTER — Encounter: Payer: Self-pay | Admitting: Physician Assistant

## 2024-04-02 ENCOUNTER — Other Ambulatory Visit: Payer: Self-pay | Admitting: Adult Health

## 2024-04-02 ENCOUNTER — Other Ambulatory Visit (HOSPITAL_COMMUNITY): Payer: Self-pay

## 2024-04-02 ENCOUNTER — Encounter (HOSPITAL_COMMUNITY): Payer: Self-pay

## 2024-04-02 ENCOUNTER — Other Ambulatory Visit: Payer: Self-pay

## 2024-04-02 DIAGNOSIS — F331 Major depressive disorder, recurrent, moderate: Secondary | ICD-10-CM

## 2024-04-03 ENCOUNTER — Other Ambulatory Visit (HOSPITAL_COMMUNITY): Payer: Self-pay

## 2024-04-03 ENCOUNTER — Ambulatory Visit

## 2024-04-03 ENCOUNTER — Ambulatory Visit (INDEPENDENT_AMBULATORY_CARE_PROVIDER_SITE_OTHER): Admitting: Behavioral Health

## 2024-04-03 VITALS — BP 157/89 | HR 67

## 2024-04-03 DIAGNOSIS — F339 Major depressive disorder, recurrent, unspecified: Secondary | ICD-10-CM

## 2024-04-03 MED ORDER — BUPROPION HCL ER (XL) 150 MG PO TB24
150.0000 mg | ORAL_TABLET | ORAL | 0 refills | Status: DC
Start: 2024-04-03 — End: 2024-04-23
  Filled 2024-04-03: qty 30, 30d supply, fill #0

## 2024-04-03 NOTE — Progress Notes (Signed)
 Robin Stevenson [id number] August 14, 1976 48 y.o.  Subjective:   Patient ID:  Robin Stevenson is a 48 y.o. (DOB 1975/12/30) female.  Chief Complaint: No chief complaint on file.   HPI Robin Stevenson presents to the office today for follow-up of  treatment resistant depression (TRD).   Spravato  treatment    Patient was administered Spravato  84 mg intranasally today. Patient was observed by provider throughout Spravato  treatment. The patient experienced the typical dissociation which gradually resolved over the 2-hour period of observation. She did not feel sleepy, decreased feeling of sensitivity (numbness), lack of energy, increased blood pressure, feeling happy or very excited, or headache. She denied feeling disconnected from themself, their thoughts, feelings and things around them. Blood pressures remained within normal ranges at the 40-minute and 2-hour follow-up intervals. By the time the 2-hour observation period was met the patient was alert and oriented and able to exit without assistance. 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 See nursing note for further details.   Reports depression 4/10 today. Says that she continues to maintain good stability but complicated by hormone therapy and menopause. No other questions or concerns today      ECT-MADRS    Flowsheet Row Video Visit from 03/20/2024 in [facility] Clinical Support from 01/23/2024 in [facility]  MADRS Total Score 20 18     Review of Systems:  Review of Systems  Constitutional: Negative.   Neurological: Negative.   Psychiatric/Behavioral:  Positive for dysphoric mood.     Medications: I have reviewed the patient's current medications.  Current Outpatient Medications  Medication Sig Dispense Refill   ALPRAZolam  (XANAX ) 0.5 MG tablet Take 1 tablet at bedtime the night before procedure. Take 1  tablet 1 hour before arrival. Bring third tablet with you to procedure. 3 tablet 0   atorvastatin  (LIPITOR) 20 MG tablet Take 1 tablet (20 mg total) by mouth daily. 90 tablet 3   atovaquone -proguanil (MALARONE ) 250-100 MG TABS tablet Take 1 tablet by mouth once daily begin 2 days prearrival,take daily during stay & continue for 7 days posttravel for malaria prevention 24 tablet 0   azithromycin  (ZITHROMAX ) 500 MG tablet For non bloody diarrhea,take 2 tabs on day 1, if resolved,stop med,If diarrhea persists 1 tab on day 2 & 3. For bloody diarrhea,2 tabs on day 1 & 1 tab on day 2 & 3 4 tablet 0   buPROPion  (WELLBUTRIN  XL) 150 MG 24 hr tablet Take 1 tablet (150 mg total) by mouth every morning. 30 tablet 0   cariprazine  (VRAYLAR ) 1.5 MG capsule Take 1 capsule (1.5 mg total) by mouth daily. 28 capsule 0   celecoxib  (CELEBREX ) 200 MG capsule Take 1 capsule by mouth single dose as directed. Take 1 hour prior to arrival for surgery 1 capsule 0   Esketamine HCl, 56 MG Dose, (SPRAVATO , 56 MG DOSE,) 28 MG/DEVICE SOPK Dispense X 2 (28mg ) devices, administer on day 1 intranasally as directed (Patient not taking: Reported on 02/17/2024) 4 each 0   Esketamine HCl, 84 MG Dose, (SPRAVATO , 84 MG DOSE,) 28 MG/DEVICE SOPK Dispense X 3 (28 mg) devices for treatments every 7 days, administer intranasally 3 each 3   esomeprazole (NEXIUM) 20 MG capsule Take 20 mg by mouth once.     fluocinonide  ointment (LIDEX ) 0.05 % Apply as directed to skin twice a day 30 g 0   gabapentin  (NEURONTIN ) 300 MG capsule Take 1 capsule (300 mg  total) by mouth 3 (three) times daily. Take first dose 1 hour before arrival for surgery. 21 capsule 0   hydrocortisone  2.5 % cream Apply a small amount to affected area twice daily for 1-2 weeks as directed. 20 g 0   ketoconazole  (NIZORAL ) 2 % cream Apply a small amount to affected area on left foot/toe once a day 30 g 0   Levomilnacipran  HCl ER (FETZIMA ) 120 MG CP24 Take 1 capsule by mouth daily. 90  capsule 3   meclizine  (ANTIVERT ) 25 MG tablet Take 1 tablet (25 mg total) by mouth as directed prior to treatment. 60 tablet 0   metFORMIN  (GLUCOPHAGE ) 500 MG tablet Take 1 tablet (500 mg total) by mouth daily. 90 tablet 1   metFORMIN  (GLUCOPHAGE ) 500 MG tablet Take 1 tablet (500 mg total) by mouth 2 (two) times daily. 180 tablet 1   metFORMIN  (GLUCOPHAGE ) 500 MG tablet Take 1 tablet (500 mg total) by mouth 2 (two) times daily. 90 tablet 1   metFORMIN  (GLUCOPHAGE -XR) 500 MG 24 hr tablet Take 1 tablet (500 mg total) by mouth daily. Please do not fill if not picked up within 30 days. 30 tablet 0   methocarbamol  (ROBAXIN ) 500 MG tablet Take 1 tablet (500 mg total) by mouth every 6 (six) hours as needed for muscle spasms. (Patient not taking: Reported on 10/29/2023) 45 tablet 0   Norethindrone-Ethinyl Estradiol -Fe Biphas (LO LOESTRIN FE ) 1 MG-10 MCG / 10 MCG tablet Take 1 tablet by mouth daily. 28 tablet 4   ondansetron  (ZOFRAN ) 4 MG tablet Take 1 tablet  by mouth every 6 hours as needed for nausea 15 tablet 0   ondansetron  (ZOFRAN ) 4 MG tablet Take 1 tablet (4 mg total) by mouth every 6 (six) hours for nausea. TAKE 4 tablets 15 minutes prior to arrival for surgery 30 tablet 0   ondansetron  (ZOFRAN -ODT) 4 MG disintegrating tablet Dissolve 1 tablet (4 mg total) by mouth every 8 (eight) hours as needed for nausea or vomiting. 20 tablet 1   oxyCODONE  (OXY IR/ROXICODONE ) 5 MG immediate release tablet Take 1-2 tablet by mouth every six to eight hours as needed for Post Op Pain. DO NOT EXCEED  6 tablets in 24 hours. 30 tablet 0   polyethylene glycol (MIRALAX / GLYCOLAX) packet Take 17 g by mouth daily.     polyethylene glycol-electrolytes (NULYTELY) 420 g solution Take 4,000 mLs by mouth Nightly. 4000 mL 0   Semaglutide -Weight Management (WEGOVY ) 2.4 MG/0.75ML SOAJ Inject 2.4 mg into the skin once a week for weight loss. 3 mL 0   sulfamethoxazole -trimethoprim  (BACTRIM  DS) 800-160 MG tablet Take 1 tablet by mouth  2 (two) times daily as directed. Start the day after surgery. 10 tablet 0   SUMAtriptan  (IMITREX ) 50 MG tablet TAKE 1 TABLET BY MOUTH AT ONSET OF HEADACHE, MAY REPEAT 1 DOSE AFTER 2 HOURS IF NEEDED 10 tablet 5   SUMAtriptan  (IMITREX ) 50 MG tablet Take 1 tablet by mouth at onset of headache, may repeat same dose once 2 hours later if needed 10 tablet 5   SUMAtriptan  (IMITREX ) 50 MG tablet Take 1 tablet (50 mg total) by mouth at the onset of headache. May repeat 1 dose after 2 hours if needed. 10 tablet 5   SUMAtriptan  (IMITREX ) 50 MG tablet Take 1 tablet (50 mg total) by mouth at onset of headache,may repeat 1 dose after 2 hours if needed, 2 to 3 times a week 9 tablet 6   SUMAtriptan  (IMITREX ) 50 MG tablet Take  1 tablet (50 mg total) by mouth as needed at onset of headache. May repeat 1 dose after 2 hours if needed. Only take 2-3 times a week. 9 tablet 6   terbinafine  (LAMISIL ) 250 MG tablet Take 1 tablet (250 mg total) by mouth daily for Tinea 14 tablet 0   traZODone  (DESYREL ) 50 MG tablet Take 50 mg by mouth at bedtime.     traZODone  (DESYREL ) 50 MG tablet TAKE 1 TO 2 TABLETS BY MOUTH AT BEDTIME 180 tablet 1   traZODone  (DESYREL ) 50 MG tablet Take 1-2 tablets (50-100 mg total) by mouth at bedtime. 180 tablet 1   WEGOVY  0.5 MG/0.5ML SOAJ Inject 0.5 mg into the skin once a week for weight loss. 2 mL 0   WEGOVY  1 MG/0.5ML SOAJ Inject 1 mg into the skin once a week for weight loss 2 mL 0   WEGOVY  2.4 MG/0.75ML SOAJ Inject 2.4 mg into the skin once a week for weight loss. 3 mL 0   WEGOVY  2.4 MG/0.75ML SOAJ Inject 2.4 mg into the skin once a week fro weight loss 3 mL 0   WEGOVY  2.4 MG/0.75ML SOAJ Inject 2.4 mg into the skin once a week for weight loss 3 mL 0   WEGOVY  2.4 MG/0.75ML SOAJ Inject 2.4 mg,sub-q, into the skin once a week for weight loss 3 mL 0   WEGOVY  2.4 MG/0.75ML SOAJ Inject 2.4 mg into the skin once a week for weight loss. 3 mL 0   WEGOVY  2.4 MG/0.75ML SOAJ Inject 2.4 mg into the skin once  a week for weight loss. 3 mL 0   WEGOVY  2.4 MG/0.75ML SOAJ Inject 2.4 mg into the skin once a week for weight loss 3 mL 0   No current facility-administered medications for this visit.    Medication Side Effects: None  Allergies:  Allergies  Allergen Reactions   Cat Dander Anaphylaxis   Brewers Yeast Rash    Past Medical History:  Diagnosis Date   Depression    takes Welbutrin and Fetzima    GERD (gastroesophageal reflux disease)    takes nexium    Past Medical History, Surgical history, Social history, and Family history were reviewed and updated as appropriate.   Please see review of systems for further details on the patient's review from today.   Objective:   Physical Exam:  There were no vitals taken for this visit.  Physical Exam Psychiatric:        Attention and Perception: Attention and perception normal.        Mood and Affect: Mood and affect normal.        Speech: Speech normal.        Behavior: Behavior normal. Behavior is cooperative.        Cognition and Memory: Cognition and memory normal.        Judgment: Judgment normal.     Lab Review:     Component Value Date/Time   NA 140 09/01/2016 0852   K 4.4 09/01/2016 0852   CL 104 09/01/2016 0852   CO2 31 09/01/2016 0852   GLUCOSE 90 09/01/2016 0852   BUN 14 09/01/2016 0852   CREATININE 0.86 09/01/2016 0852   CALCIUM  9.2 09/01/2016 0852   PROT 7.0 09/01/2016 0852   ALBUMIN 4.3 09/01/2016 0852   AST 33 09/01/2016 0852   ALT 70 (H) 09/01/2016 0852   ALKPHOS 66 09/01/2016 0852   BILITOT 0.7 09/01/2016 0852       Component Value Date/Time   WBC  5.1 09/01/2016 0852   RBC 4.30 09/01/2016 0852   HGB 13.5 09/01/2016 0852   HCT 38.9 09/01/2016 0852   PLT 286.0 09/01/2016 0852   MCV 90.4 09/01/2016 0852   MCHC 34.6 09/01/2016 0852   RDW 11.9 09/01/2016 0852   LYMPHSABS 1.5 09/01/2016 0852   MONOABS 0.4 09/01/2016 0852   EOSABS 0.1 09/01/2016 0852   BASOSABS 0.0 09/01/2016 0852    No results  found for: POCLITH, LITHIUM   No results found for: PHENYTOIN, PHENOBARB, VALPROATE, CBMZ   .res Assessment: Plan:    PDMP reviewed.      Robin Stevenson reports an overall improved mood and continues to respond to weekly Spravato  treatments.   Continue Spravato   as indicated    Will be skipping next week and will return 8/28.    Robin Stevenson A. Matan Steen, NP    Diagnoses and all orders for this visit:  Recurrent major depression resistant to treatment [facility])     Please see After Visit Summary for patient specific instructions.  Future Appointments  Date Time Provider Department Center  04/15/2024 10:20 AM [facility]    No orders of the defined types were placed in this encounter.   -------------------------------

## 2024-04-03 NOTE — Progress Notes (Signed)
 NURSES NOTE:   Pt arrived for her # 56 Spravato  Treatment for treatment resistant depression, the starting dose was 56 mg (2 of the 28 mg nasal sprays) pt stayed at that dose for the first 4 treatments and also takes Zofran  and Meclizine  prior to arrival at office. Pt received 84 mg (3 of the 28 mg nasal sprays) total again today and did also premedicate. Pt is now using her medical benefits and receives Spravato  through [facility], today is the first time using it. Pt sees Angeline Sayers, NP and she will follow her throughout her treatments and follow ups. Spravato  medication is stored at treatment center per REMS/FDA guidelines. The medication is required to be locked behind two doors per REMS/FDA protocol. Medication is also disposed of properly after each use per regulations. All documentation for REMS is completed and submitted per FDA/REMS requirements.          Pt directed to treatment room, started vitals at 10:05 AM, 127/79, pulse 72, SpO2 99%. Pt did take Zofran  4 mg and Meclizine  25 mg prior to arriving at office, an hour ahead. Instructed patient to blow her nose if needed then recline back to a 45 degree angle if that was more comfortable for her. Gave patient first dose 28 mg nasal spray, administered in each nostril as directed and observed by nurse, waited 5 more minutes for the second and third doses. After all doses given pt did not complain of nausea. pt keeps water and some mints to help with the taste of Spravato  it gives the metallic taste. Her 40 minute check was at 10:50 AM, vital signs were 132/87, pulse 74, SpO2 98%. Pt aware she would be monitored for a total time of 120 minutes.  Discharge vitals were taken at 12:01 PM 157/89 P 67, SpO2 98%. Pt met with [employer]ell [employer], NP to discuss her treatment. Pt advised to relax the rest of the day. No driving, no intense activities. Verbalized understanding. Pt reports no issues today. Nurse was with pt a total of 60 minutes for clinical  assessment.  Pt instructed to call office with any problems or questions prior to next treatment. She is scheduled 2 weeks.      LOT [id number] EXP FEB 2028

## 2024-04-15 ENCOUNTER — Ambulatory Visit
Admission: RE | Admit: 2024-04-15 | Discharge: 2024-04-15 | Disposition: A | Discharge status: Home / Self Care | Source: Ambulatory Visit | Attending: Obstetrics and Gynecology | Admitting: Obstetrics and Gynecology

## 2024-04-15 DIAGNOSIS — R921 Mammographic calcification found on diagnostic imaging of breast: Secondary | ICD-10-CM

## 2024-04-17 ENCOUNTER — Encounter: Payer: Self-pay | Admitting: Physician Assistant

## 2024-04-17 ENCOUNTER — Ambulatory Visit

## 2024-04-17 ENCOUNTER — Ambulatory Visit (INDEPENDENT_AMBULATORY_CARE_PROVIDER_SITE_OTHER): Admitting: Physician Assistant

## 2024-04-17 VITALS — BP 143/86 | HR 74

## 2024-04-17 DIAGNOSIS — F339 Major depressive disorder, recurrent, unspecified: Secondary | ICD-10-CM

## 2024-04-17 DIAGNOSIS — F411 Generalized anxiety disorder: Secondary | ICD-10-CM

## 2024-04-17 NOTE — Progress Notes (Signed)
 NURSES NOTE:   Pt arrived for her # 48 Spravato  Treatment for treatment resistant depression, the starting dose was 56 mg (2 of the 28 mg nasal sprays) pt stayed at that dose for the first 4 treatments and also takes Zofran  and Meclizine  prior to arrival at office. Pt received 84 mg (3 of the 28 mg nasal sprays) total again today and did also premedicate. Pt is now using her medical benefits and receives Spravato  through [facility], today is the first time using it. Pt sees Angeline Sayers, NP and she will follow her throughout her treatments and follow ups. Spravato  medication is stored at treatment center per REMS/FDA guidelines. The medication is required to be locked behind two doors per REMS/FDA protocol. Medication is also disposed of properly after each use per regulations. All documentation for REMS is completed and submitted per FDA/REMS requirements.          Pt directed to treatment room, started vitals at 10:05 AM, 133/79, pulse 73, SpO2 98%. Pt did take Zofran  4 mg and Meclizine  25 mg prior to arriving at office, an hour ahead. Instructed patient to blow her nose if needed then recline back to a 45 degree angle if that was more comfortable for her. Gave patient first dose 28 mg nasal spray, administered in each nostril as directed and observed by nurse, waited 5 more minutes for the second and third doses. After all doses given pt did not complain of nausea. pt keeps water and some mints to help with the taste of Spravato  it gives the metallic taste. Her 40 minute check was at 10:45 AM, vital signs were 162/96, pulse 78, SpO2 96%. Pt aware she would be monitored for a total time of 120 minutes.  Discharge vitals were taken at 11:57 AM 143/86 P 74, SpO2 99%. Pt met with Verneita Cooks, PA-C to discuss her treatment. Pt advised to relax the rest of the day. No driving, no intense activities. Verbalized understanding. Pt reports no issues today. Nurse was with pt a total of 60 minutes for clinical  assessment.  Pt instructed to call office with any problems or questions prior to next treatment. She is scheduled next Wednesday.      LOT [id number] EXP FEB 2028

## 2024-04-17 NOTE — Progress Notes (Signed)
 [facility]  Patient ID: KELCEY KORUS,  MRN: [id number]  PCP: Signa Rush, MD (Inactive)  Date of Evaluation: 04/17/2024 Time spent:65 minutes  Chief Complaint:  Chief Complaint   Depression; Other    HISTORY/CURRENT STATUS: HPI for Spravato  treatment, she sees Angeline Sayers, NP who is out of the office today.  [name] is doing well.  Spravato  is still effective. All other meds are helpful as well. Able to enjoy things.  Energy and motivation are good.  Sleeps ok most of the time.  ADLs and personal hygiene are normal.   Appetite has not changed.  Weight is stable.  No points of extreme anxiety.  Xanax  helps when needed.  No mania, delirium, AH/VH.  No SI/HI.  Individual Medical History/ Review of Systems: Changes? :No   Past medications for mental health diagnoses include: Spravato , Xanax , Wellbutrin , Vraylar , gabapentin , Fetzima , trazodone   Allergies: Cat dander and Brewers yeast  Current Medications:  Current Outpatient Medications:    ALPRAZolam  (XANAX ) 0.5 MG tablet, Take 1 tablet at bedtime the night before procedure. Take 1 tablet 1 hour before arrival. Bring third tablet with you to procedure., Disp: 3 tablet, Rfl: 0   atorvastatin  (LIPITOR) 20 MG tablet, Take 1 tablet (20 mg total) by mouth daily., Disp: 90 tablet, Rfl: 3   atovaquone -proguanil (MALARONE ) 250-100 MG TABS tablet, Take 1 tablet by mouth once daily begin 2 days prearrival,take daily during stay & continue for 7 days posttravel for malaria prevention, Disp: 24 tablet, Rfl: 0   azithromycin  (ZITHROMAX ) 500 MG tablet, For non bloody diarrhea,take 2 tabs on day 1, if resolved,stop med,If diarrhea persists 1 tab on day 2 & 3. For bloody diarrhea,2 tabs on day 1 & 1 tab on day 2 & 3, Disp: 4 tablet, Rfl: 0   buPROPion  (WELLBUTRIN  XL) 150 MG 24 hr tablet, Take 1 tablet (150 mg total) by mouth every morning., Disp: 30 tablet, Rfl: 0   cariprazine  (VRAYLAR ) 1.5 MG capsule, Take 1 capsule (1.5 mg  total) by mouth daily., Disp: 28 capsule, Rfl: 0   celecoxib  (CELEBREX ) 200 MG capsule, Take 1 capsule by mouth single dose as directed. Take 1 hour prior to arrival for surgery, Disp: 1 capsule, Rfl: 0   Esketamine HCl, 56 MG Dose, (SPRAVATO , 56 MG DOSE,) 28 MG/DEVICE SOPK, Dispense X 2 (28mg ) devices, administer on day 1 intranasally as directed (Patient not taking: Reported on 02/17/2024), Disp: 4 each, Rfl: 0   Esketamine HCl, 84 MG Dose, (SPRAVATO , 84 MG DOSE,) 28 MG/DEVICE SOPK, Dispense X 3 (28 mg) devices for treatments every 7 days, administer intranasally, Disp: 3 each, Rfl: 3   esomeprazole (NEXIUM) 20 MG capsule, Take 20 mg by mouth once., Disp: , Rfl:    fluocinonide  ointment (LIDEX ) 0.05 %, Apply as directed to skin twice a day, Disp: 30 g, Rfl: 0   gabapentin  (NEURONTIN ) 300 MG capsule, Take 1 capsule (300 mg total) by mouth 3 (three) times daily. Take first dose 1 hour before arrival for surgery., Disp: 21 capsule, Rfl: 0   hydrocortisone  2.5 % cream, Apply a small amount to affected area twice daily for 1-2 weeks as directed., Disp: 20 g, Rfl: 0   ketoconazole  (NIZORAL ) 2 % cream, Apply a small amount to affected area on left foot/toe once a day, Disp: 30 g, Rfl: 0   Levomilnacipran  HCl ER (FETZIMA ) 120 MG CP24, Take 1 capsule by mouth daily., Disp: 90 capsule, Rfl: 3   meclizine  (ANTIVERT ) 25 MG tablet, Take  1 tablet (25 mg total) by mouth as directed prior to treatment., Disp: 60 tablet, Rfl: 0   metFORMIN  (GLUCOPHAGE ) 500 MG tablet, Take 1 tablet (500 mg total) by mouth daily., Disp: 90 tablet, Rfl: 1   metFORMIN  (GLUCOPHAGE ) 500 MG tablet, Take 1 tablet (500 mg total) by mouth 2 (two) times daily., Disp: 180 tablet, Rfl: 1   metFORMIN  (GLUCOPHAGE ) 500 MG tablet, Take 1 tablet (500 mg total) by mouth 2 (two) times daily., Disp: 90 tablet, Rfl: 1   metFORMIN  (GLUCOPHAGE -XR) 500 MG 24 hr tablet, Take 1 tablet (500 mg total) by mouth daily. Please do not fill if not picked up within 30  days., Disp: 30 tablet, Rfl: 0   methocarbamol  (ROBAXIN ) 500 MG tablet, Take 1 tablet (500 mg total) by mouth every 6 (six) hours as needed for muscle spasms. (Patient not taking: Reported on 10/29/2023), Disp: 45 tablet, Rfl: 0   Norethindrone-Ethinyl Estradiol -Fe Biphas (LO LOESTRIN FE ) 1 MG-10 MCG / 10 MCG tablet, Take 1 tablet by mouth daily., Disp: 28 tablet, Rfl: 4   ondansetron  (ZOFRAN ) 4 MG tablet, Take 1 tablet  by mouth every 6 hours as needed for nausea, Disp: 15 tablet, Rfl: 0   ondansetron  (ZOFRAN ) 4 MG tablet, Take 1 tablet (4 mg total) by mouth every 6 (six) hours for nausea. TAKE 4 tablets 15 minutes prior to arrival for surgery, Disp: 30 tablet, Rfl: 0   ondansetron  (ZOFRAN -ODT) 4 MG disintegrating tablet, Dissolve 1 tablet (4 mg total) by mouth every 8 (eight) hours as needed for nausea or vomiting., Disp: 20 tablet, Rfl: 1   oxyCODONE  (OXY IR/ROXICODONE ) 5 MG immediate release tablet, Take 1-2 tablet by mouth every six to eight hours as needed for Post Op Pain. DO NOT EXCEED  6 tablets in 24 hours., Disp: 30 tablet, Rfl: 0   polyethylene glycol (MIRALAX / GLYCOLAX) packet, Take 17 g by mouth daily., Disp: , Rfl:    polyethylene glycol-electrolytes (NULYTELY) 420 g solution, Take 4,000 mLs by mouth Nightly., Disp: 4000 mL, Rfl: 0   Semaglutide -Weight Management (WEGOVY ) 2.4 MG/0.75ML SOAJ, Inject 2.4 mg into the skin once a week for weight loss., Disp: 3 mL, Rfl: 0   sulfamethoxazole -trimethoprim  (BACTRIM  DS) 800-160 MG tablet, Take 1 tablet by mouth 2 (two) times daily as directed. Start the day after surgery., Disp: 10 tablet, Rfl: 0   SUMAtriptan  (IMITREX ) 50 MG tablet, TAKE 1 TABLET BY MOUTH AT ONSET OF HEADACHE, MAY REPEAT 1 DOSE AFTER 2 HOURS IF NEEDED, Disp: 10 tablet, Rfl: 5   SUMAtriptan  (IMITREX ) 50 MG tablet, Take 1 tablet by mouth at onset of headache, may repeat same dose once 2 hours later if needed, Disp: 10 tablet, Rfl: 5   SUMAtriptan  (IMITREX ) 50 MG tablet, Take 1  tablet (50 mg total) by mouth at the onset of headache. May repeat 1 dose after 2 hours if needed., Disp: 10 tablet, Rfl: 5   SUMAtriptan  (IMITREX ) 50 MG tablet, Take 1 tablet (50 mg total) by mouth at onset of headache,may repeat 1 dose after 2 hours if needed, 2 to 3 times a week, Disp: 9 tablet, Rfl: 6   SUMAtriptan  (IMITREX ) 50 MG tablet, Take 1 tablet (50 mg total) by mouth as needed at onset of headache. May repeat 1 dose after 2 hours if needed. Only take 2-3 times a week., Disp: 9 tablet, Rfl: 6   terbinafine  (LAMISIL ) 250 MG tablet, Take 1 tablet (250 mg total) by mouth daily for Tinea, Disp: 14  tablet, Rfl: 0   traZODone  (DESYREL ) 50 MG tablet, Take 50 mg by mouth at bedtime., Disp: , Rfl:    traZODone  (DESYREL ) 50 MG tablet, TAKE 1 TO 2 TABLETS BY MOUTH AT BEDTIME, Disp: 180 tablet, Rfl: 1   traZODone  (DESYREL ) 50 MG tablet, Take 1-2 tablets (50-100 mg total) by mouth at bedtime., Disp: 180 tablet, Rfl: 1   WEGOVY  0.5 MG/0.5ML SOAJ, Inject 0.5 mg into the skin once a week for weight loss., Disp: 2 mL, Rfl: 0   WEGOVY  1 MG/0.5ML SOAJ, Inject 1 mg into the skin once a week for weight loss, Disp: 2 mL, Rfl: 0   WEGOVY  2.4 MG/0.75ML SOAJ, Inject 2.4 mg into the skin once a week for weight loss., Disp: 3 mL, Rfl: 0   WEGOVY  2.4 MG/0.75ML SOAJ, Inject 2.4 mg into the skin once a week fro weight loss, Disp: 3 mL, Rfl: 0   WEGOVY  2.4 MG/0.75ML SOAJ, Inject 2.4 mg into the skin once a week for weight loss, Disp: 3 mL, Rfl: 0   WEGOVY  2.4 MG/0.75ML SOAJ, Inject 2.4 mg,sub-q, into the skin once a week for weight loss, Disp: 3 mL, Rfl: 0   WEGOVY  2.4 MG/0.75ML SOAJ, Inject 2.4 mg into the skin once a week for weight loss., Disp: 3 mL, Rfl: 0   WEGOVY  2.4 MG/0.75ML SOAJ, Inject 2.4 mg into the skin once a week for weight loss., Disp: 3 mL, Rfl: 0   WEGOVY  2.4 MG/0.75ML SOAJ, Inject 2.4 mg into the skin once a week for weight loss, Disp: 3 mL, Rfl: 0 Medication Side Effects: none  Family Medical/  Social History: Changes?   No  MENTAL HEALTH EXAM:  Last menstrual period 04/01/2024.There is no height or weight on file to calculate BMI.  General Appearance: Casual and Well Groomed  Eye Contact:  Good  Speech:  Clear and Coherent and Normal Rate  Volume:  Normal  Mood:  Euthymic  Affect:  Congruent  Thought Process:  Goal Directed and Descriptions of Associations: Circumstantial  Orientation:  Full (Time, Place, and Person)  Thought Content: Logical   Suicidal Thoughts:  No  Homicidal Thoughts:  No  Memory:  WNL  Judgement:  Good  Insight:  Good  Psychomotor Activity:  Normal  Concentration:  Concentration: Good and Attention Span: Good  Recall:  Good  Fund of Knowledge: Good  Language: Good  Assets:  Communication Skills Desire for Improvement Financial Resources/Insurance Housing Leisure Time Physical Health Resilience Transportation Vocational/Educational  ADL's:  Intact  Cognition: WNL  Prognosis:  Good   ECT-MADRS    Flowsheet Row Video Visit from 03/20/2024 in [facility] Clinical Support from 01/23/2024 in [facility]  MADRS Total Score 20 18   Patient was administered Spravato  84 mg intranasally today.  The patient experienced the typical dissociation which gradually resolved over the 2-hour period of observation.  There were no complications.  Specifically the patient did not have nausea or vomiting or headache.  Blood pressures remained within normal ranges at the 40-minute and 2-hour follow-up intervals.  Pulse ox were nl. By the time the 2-hour observation period was met the patient was alert and oriented and able to exit without assistance.  Patient feels the Spravato  administration is helpful for the treatment resistant depression and would like to continue the treatment.  See nursing note for further details.  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 Generalized anxiety disorder  F41.1       Receiving Psychotherapy: No   RECOMMENDATIONS:   PDMP reviewed.  Spravato  filled 12/31/2023. I provided approximately  65 minutes of face to face time during this encounter, including time spent before and after the visit in records review, medical decision making, counseling pertinent to today's visit, and charting.   [name] continues to do well on current tx. No change needed.   Continue Xanax  0.5 mg, daily as needed. Continue Wellbutrin  XL 150 mg daily. Continue Vraylar  1.5 mg, 1 p.o. daily. Continue Spravato  weekly. Continue Fetzima  120 mg, 1 p.o. daily. Continue trazodone  50 mg, 1-2 nightly as needed sleep. Return in 1 week.  Verneita Cooks, PA-C

## 2024-04-23 ENCOUNTER — Ambulatory Visit

## 2024-04-23 ENCOUNTER — Ambulatory Visit (INDEPENDENT_AMBULATORY_CARE_PROVIDER_SITE_OTHER): Admitting: Physician Assistant

## 2024-04-23 ENCOUNTER — Other Ambulatory Visit: Payer: Self-pay | Admitting: Adult Health

## 2024-04-23 VITALS — BP 145/94 | HR 71

## 2024-04-23 DIAGNOSIS — F331 Major depressive disorder, recurrent, moderate: Secondary | ICD-10-CM

## 2024-04-23 DIAGNOSIS — F411 Generalized anxiety disorder: Secondary | ICD-10-CM | POA: Diagnosis not present

## 2024-04-23 DIAGNOSIS — F339 Major depressive disorder, recurrent, unspecified: Secondary | ICD-10-CM

## 2024-04-23 NOTE — Progress Notes (Signed)
 [facility]  Patient ID: Robin Stevenson,  MRN: [id number]  PCP: Robin Rush, MD (Inactive)  Date of Evaluation: 04/23/2024 Time spent:60 minutes  Chief Complaint:  Chief Complaint   Depression; Other    HISTORY/CURRENT STATUS: HPI for Spravato  treatment, she sees Robin Sayers, NP who is out of the office today.  [name] is doing well. Patient is able to enjoy things.  Energy and motivation are good.  No extreme sadness, tearfulness, or feelings of hopelessness.  Sleeps ok. ADLs and personal hygiene are normal.   Denies any changes in concentration, making decisions, or remembering things.  Appetite has not changed.  No mania, delirium, AH/VH.  No SI/HI.  Individual Medical History/ Review of Systems: Changes? :No   Past medications for mental health diagnoses include: Spravato , Xanax , Wellbutrin , Vraylar , gabapentin , Fetzima , trazodone   Allergies: Cat dander and Brewers yeast  Current Medications:  Current Outpatient Medications:    ALPRAZolam  (XANAX ) 0.5 MG tablet, Take 1 tablet at bedtime the night before procedure. Take 1 tablet 1 hour before arrival. Bring third tablet with you to procedure., Disp: 3 tablet, Rfl: 0   atorvastatin  (LIPITOR) 20 MG tablet, Take 1 tablet (20 mg total) by mouth daily., Disp: 90 tablet, Rfl: 3   atovaquone -proguanil (MALARONE ) 250-100 MG TABS tablet, Take 1 tablet by mouth once daily begin 2 days prearrival,take daily during stay & continue for 7 days posttravel for malaria prevention, Disp: 24 tablet, Rfl: 0   azithromycin  (ZITHROMAX ) 500 MG tablet, For non bloody diarrhea,take 2 tabs on day 1, if resolved,stop med,If diarrhea persists 1 tab on day 2 & 3. For bloody diarrhea,2 tabs on day 1 & 1 tab on day 2 & 3, Disp: 4 tablet, Rfl: 0   buPROPion  (WELLBUTRIN  XL) 150 MG 24 hr tablet, Take 1 tablet (150 mg total) by mouth every morning., Disp: 30 tablet, Rfl: 2   cariprazine  (VRAYLAR ) 1.5 MG capsule, Take 1 capsule (1.5 mg total) by  mouth daily., Disp: 28 capsule, Rfl: 0   celecoxib  (CELEBREX ) 200 MG capsule, Take 1 capsule by mouth single dose as directed. Take 1 hour prior to arrival for surgery, Disp: 1 capsule, Rfl: 0   Esketamine HCl, 84 MG Dose, (SPRAVATO , 84 MG DOSE,) 28 MG/DEVICE SOPK, Dispense X 3 (28 mg) devices for treatments every 7 days, administer intranasally, Disp: 3 each, Rfl: 3   esomeprazole (NEXIUM) 20 MG capsule, Take 20 mg by mouth once., Disp: , Rfl:    fluocinonide  ointment (LIDEX ) 0.05 %, Apply as directed to skin twice a day, Disp: 30 g, Rfl: 0   gabapentin  (NEURONTIN ) 300 MG capsule, Take 1 capsule (300 mg total) by mouth 3 (three) times daily. Take first dose 1 hour before arrival for surgery., Disp: 21 capsule, Rfl: 0   hydrocortisone  2.5 % cream, Apply a small amount to affected area twice daily for 1-2 weeks as directed., Disp: 20 g, Rfl: 0   ketoconazole  (NIZORAL ) 2 % cream, Apply a small amount to affected area on left foot/toe once a day, Disp: 30 g, Rfl: 0   Levomilnacipran  HCl ER (FETZIMA ) 120 MG CP24, Take 1 capsule by mouth daily., Disp: 90 capsule, Rfl: 3   meclizine  (ANTIVERT ) 25 MG tablet, Take 1 tablet (25 mg total) by mouth as directed prior to treatment., Disp: 60 tablet, Rfl: 0   metFORMIN  (GLUCOPHAGE ) 500 MG tablet, Take 1 tablet (500 mg total) by mouth daily., Disp: 90 tablet, Rfl: 1   metFORMIN  (GLUCOPHAGE ) 500 MG tablet, Take 1  tablet (500 mg total) by mouth 2 (two) times daily., Disp: 180 tablet, Rfl: 1   metFORMIN  (GLUCOPHAGE ) 500 MG tablet, Take 1 tablet (500 mg total) by mouth 2 (two) times daily., Disp: 90 tablet, Rfl: 1   metFORMIN  (GLUCOPHAGE -XR) 500 MG 24 hr tablet, Take 1 tablet (500 mg total) by mouth daily. Please do not fill if not picked up within 30 days., Disp: 30 tablet, Rfl: 0   methocarbamol  (ROBAXIN ) 500 MG tablet, Take 1 tablet (500 mg total) by mouth every 6 (six) hours as needed for muscle spasms. (Patient not taking: Reported on 10/29/2023), Disp: 45 tablet,  Rfl: 0   Norethindrone-Ethinyl Estradiol -Fe Biphas (LO LOESTRIN FE ) 1 MG-10 MCG / 10 MCG tablet, Take 1 tablet by mouth daily., Disp: 28 tablet, Rfl: 4   ondansetron  (ZOFRAN ) 4 MG tablet, Take 1 tablet  by mouth every 6 hours as needed for nausea, Disp: 15 tablet, Rfl: 0   ondansetron  (ZOFRAN ) 4 MG tablet, Take 1 tablet (4 mg total) by mouth every 6 (six) hours for nausea. TAKE 4 tablets 15 minutes prior to arrival for surgery, Disp: 30 tablet, Rfl: 0   ondansetron  (ZOFRAN -ODT) 4 MG disintegrating tablet, Dissolve 1 tablet (4 mg total) by mouth every 8 (eight) hours as needed for nausea or vomiting., Disp: 20 tablet, Rfl: 1   oxyCODONE  (OXY IR/ROXICODONE ) 5 MG immediate release tablet, Take 1-2 tablet by mouth every six to eight hours as needed for Post Op Pain. DO NOT EXCEED  6 tablets in 24 hours., Disp: 30 tablet, Rfl: 0   polyethylene glycol (MIRALAX / GLYCOLAX) packet, Take 17 g by mouth daily., Disp: , Rfl:    polyethylene glycol-electrolytes (NULYTELY) 420 g solution, Take 4,000 mLs by mouth Nightly., Disp: 4000 mL, Rfl: 0   Semaglutide -Weight Management (WEGOVY ) 2.4 MG/0.75ML SOAJ, Inject 2.4 mg into the skin once a week for weight loss., Disp: 3 mL, Rfl: 0   sulfamethoxazole -trimethoprim  (BACTRIM  DS) 800-160 MG tablet, Take 1 tablet by mouth 2 (two) times daily as directed. Start the day after surgery., Disp: 10 tablet, Rfl: 0   SUMAtriptan  (IMITREX ) 50 MG tablet, TAKE 1 TABLET BY MOUTH AT ONSET OF HEADACHE, MAY REPEAT 1 DOSE AFTER 2 HOURS IF NEEDED, Disp: 10 tablet, Rfl: 5   SUMAtriptan  (IMITREX ) 50 MG tablet, Take 1 tablet by mouth at onset of headache, may repeat same dose once 2 hours later if needed, Disp: 10 tablet, Rfl: 5   SUMAtriptan  (IMITREX ) 50 MG tablet, Take 1 tablet (50 mg total) by mouth at the onset of headache. May repeat 1 dose after 2 hours if needed., Disp: 10 tablet, Rfl: 5   SUMAtriptan  (IMITREX ) 50 MG tablet, Take 1 tablet (50 mg total) by mouth at onset of headache,may  repeat 1 dose after 2 hours if needed, 2 to 3 times a week, Disp: 9 tablet, Rfl: 6   SUMAtriptan  (IMITREX ) 50 MG tablet, Take 1 tablet (50 mg total) by mouth as needed at onset of headache. May repeat 1 dose after 2 hours if needed. Only take 2-3 times a week., Disp: 9 tablet, Rfl: 6   terbinafine  (LAMISIL ) 250 MG tablet, Take 1 tablet (250 mg total) by mouth daily for Tinea, Disp: 14 tablet, Rfl: 0   traZODone  (DESYREL ) 50 MG tablet, Take 50 mg by mouth at bedtime., Disp: , Rfl:    traZODone  (DESYREL ) 50 MG tablet, TAKE 1 TO 2 TABLETS BY MOUTH AT BEDTIME, Disp: 180 tablet, Rfl: 1   traZODone  (DESYREL ) 50  MG tablet, Take 1-2 tablets (50-100 mg total) by mouth at bedtime., Disp: 180 tablet, Rfl: 1   WEGOVY  0.5 MG/0.5ML SOAJ, Inject 0.5 mg into the skin once a week for weight loss., Disp: 2 mL, Rfl: 0   WEGOVY  1 MG/0.5ML SOAJ, Inject 1 mg into the skin once a week for weight loss, Disp: 2 mL, Rfl: 0   WEGOVY  2.4 MG/0.75ML SOAJ, Inject 2.4 mg into the skin once a week for weight loss., Disp: 3 mL, Rfl: 0   WEGOVY  2.4 MG/0.75ML SOAJ, Inject 2.4 mg into the skin once a week fro weight loss, Disp: 3 mL, Rfl: 0   WEGOVY  2.4 MG/0.75ML SOAJ, Inject 2.4 mg into the skin once a week for weight loss, Disp: 3 mL, Rfl: 0   WEGOVY  2.4 MG/0.75ML SOAJ, Inject 2.4 mg,sub-q, into the skin once a week for weight loss, Disp: 3 mL, Rfl: 0   WEGOVY  2.4 MG/0.75ML SOAJ, Inject 2.4 mg into the skin once a week for weight loss., Disp: 3 mL, Rfl: 0   WEGOVY  2.4 MG/0.75ML SOAJ, Inject 2.4 mg into the skin once a week for weight loss., Disp: 3 mL, Rfl: 0   WEGOVY  2.4 MG/0.75ML SOAJ, Inject 2.4 mg into the skin once a week for weight loss, Disp: 3 mL, Rfl: 0 Medication Side Effects: none  Family Medical/ Social History: Changes?   No  MENTAL HEALTH EXAM:  Last menstrual period 04/01/2024.There is no height or weight on file to calculate BMI.  General Appearance: Casual and Well Groomed  Eye Contact:  Good  Speech:  Clear  and Coherent and Normal Rate  Volume:  Normal  Mood:  Euthymic  Affect:  Congruent  Thought Process:  Goal Directed and Descriptions of Associations: Circumstantial  Orientation:  Full (Time, Place, and Person)  Thought Content: Logical   Suicidal Thoughts:  No  Homicidal Thoughts:  No  Memory:  WNL  Judgement:  Good  Insight:  Good  Psychomotor Activity:  Normal  Concentration:  Concentration: Good and Attention Span: Good  Recall:  Good  Fund of Knowledge: Good  Language: Good  Assets:  Communication Skills Desire for Improvement Financial Resources/Insurance Housing Leisure Time Physical Health Resilience Social Support Transportation Vocational/Educational  ADL's:  Intact  Cognition: WNL  Prognosis:  Good   ECT-MADRS    Flowsheet Row Video Visit from 03/20/2024 in [facility] Clinical Support from 01/23/2024 in [facility]  MADRS Total Score 20 18   Patient was administered Spravato  84 mg intranasally today.  The patient experienced the typical dissociation which gradually resolved over the 2-hour period of observation.  There were no complications.  Specifically the patient did not have nausea or vomiting or headache.  Blood pressures remained within normal ranges at the 40-minute and 2-hour follow-up intervals.  Pulse ox were nl. By the time the 2-hour observation period was met the patient was alert and oriented and able to exit without assistance.  Patient feels the Spravato  administration is helpful for the treatment resistant depression and would like to continue the treatment.  See nursing note for further details.  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 Generalized anxiety disorder  F41.1       Receiving Psychotherapy: No   RECOMMENDATIONS:   PDMP reviewed.  Spravato  filled 12/31/2023. I provided approximately 60 minutes of face to face time  during this encounter, including  time spent before and after the visit in records review, medical decision making, counseling pertinent to today's visit, and charting.   She's doing well on weekly Spravato  and other meds. No changes are needed.   Continue Xanax  0.5 mg, daily as needed. Continue Wellbutrin  XL 150 mg daily. Continue Vraylar  1.5 mg, 1 p.o. daily. Continue Spravato  weekly. Continue Fetzima  120 mg, 1 p.o. daily. Continue trazodone  50 mg, 1-2 nightly as needed sleep. Return in 1 week.  Verneita Cooks, PA-C

## 2024-04-24 ENCOUNTER — Other Ambulatory Visit (HOSPITAL_COMMUNITY): Payer: Self-pay

## 2024-04-24 MED ORDER — BUPROPION HCL ER (XL) 150 MG PO TB24
150.0000 mg | ORAL_TABLET | ORAL | 2 refills | Status: DC
Start: 2024-04-24 — End: 2024-07-18
  Filled 2024-04-24 – 2024-04-28 (×2): qty 30, 30d supply, fill #0
  Filled 2024-05-24: qty 30, 30d supply, fill #1
  Filled 2024-06-29: qty 30, 30d supply, fill #2

## 2024-04-24 NOTE — Progress Notes (Signed)
 NURSES NOTE:   Pt arrived for her # 37 Spravato  Treatment for treatment resistant depression, the starting dose was 56 mg (2 of the 28 mg nasal sprays) pt stayed at that dose for the first 4 treatments and also takes Zofran  and Meclizine  prior to arrival at office. Pt received 84 mg (3 of the 28 mg nasal sprays) total again today and did also premedicate. Pt is now using her medical benefits and receives Spravato  through [facility], today is the first time using it. Pt sees Angeline Sayers, NP and she will follow her throughout her treatments and follow ups. Spravato  medication is stored at treatment center per REMS/FDA guidelines. The medication is required to be locked behind two doors per REMS/FDA protocol. Medication is also disposed of properly after each use per regulations. All documentation for REMS is completed and submitted per FDA/REMS requirements.          Pt directed to treatment room, started vitals at 10:15 AM, 145/90, pulse 74, SpO2 97%. Pt did take Zofran  4 mg and Meclizine  25 mg prior to arriving at office, an hour ahead. Instructed patient to blow her nose if needed then recline back to a 45 degree angle if that was more comfortable for her. Gave patient first dose 28 mg nasal spray, administered in each nostril as directed and observed by nurse, waited 5 more minutes for the second and third doses. After all doses given pt did not complain of nausea. pt keeps water and some mints to help with the taste of Spravato  it gives the metallic taste. Her 40 minute check was at 11:00 AM, vital signs were 142/94, pulse 66, SpO2 99%. Pt aware she would be monitored for a total time of 120 minutes.  Discharge vitals were taken at 12:13 AM 145/94 P 71, SpO2 99%. Pt met with Verneita Cooks, PA-C to discuss her treatment. Pt advised to relax the rest of the day. No driving, no intense activities. Verbalized understanding. Pt reports no issues today. Nurse was with pt a total of 60 minutes for clinical  assessment.  Pt instructed to call office with any problems or questions prior to next treatment. She is scheduled next Tuesday.      LOT [id number] [id number] FEB [id number]

## 2024-04-28 ENCOUNTER — Other Ambulatory Visit (HOSPITAL_COMMUNITY): Payer: Self-pay

## 2024-04-28 DIAGNOSIS — F325 Major depressive disorder, single episode, in full remission: Secondary | ICD-10-CM | POA: Diagnosis not present

## 2024-04-28 DIAGNOSIS — Z23 Encounter for immunization: Secondary | ICD-10-CM | POA: Diagnosis not present

## 2024-04-28 DIAGNOSIS — Z0000 Encounter for general adult medical examination without abnormal findings: Secondary | ICD-10-CM | POA: Diagnosis not present

## 2024-04-28 DIAGNOSIS — Z6825 Body mass index (BMI) 25.0-25.9, adult: Secondary | ICD-10-CM | POA: Diagnosis not present

## 2024-04-28 DIAGNOSIS — G43909 Migraine, unspecified, not intractable, without status migrainosus: Secondary | ICD-10-CM | POA: Diagnosis not present

## 2024-04-28 DIAGNOSIS — Z131 Encounter for screening for diabetes mellitus: Secondary | ICD-10-CM | POA: Diagnosis not present

## 2024-04-28 DIAGNOSIS — D1724 Benign lipomatous neoplasm of skin and subcutaneous tissue of left leg: Secondary | ICD-10-CM | POA: Diagnosis not present

## 2024-04-28 DIAGNOSIS — E785 Hyperlipidemia, unspecified: Secondary | ICD-10-CM | POA: Diagnosis not present

## 2024-04-28 DIAGNOSIS — Z124 Encounter for screening for malignant neoplasm of cervix: Secondary | ICD-10-CM | POA: Diagnosis not present

## 2024-04-29 ENCOUNTER — Encounter: Payer: Self-pay | Admitting: Physician Assistant

## 2024-04-29 ENCOUNTER — Ambulatory Visit (INDEPENDENT_AMBULATORY_CARE_PROVIDER_SITE_OTHER): Admitting: Physician Assistant

## 2024-04-29 ENCOUNTER — Ambulatory Visit

## 2024-04-29 VITALS — BP 145/89 | HR 69

## 2024-04-29 DIAGNOSIS — F339 Major depressive disorder, recurrent, unspecified: Secondary | ICD-10-CM

## 2024-04-29 DIAGNOSIS — F418 Other specified anxiety disorders: Secondary | ICD-10-CM

## 2024-04-29 NOTE — Progress Notes (Signed)
 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 Pt received 84 mg (3 of the 28 mg nasal sprays) total again today and did also premedicate. Pt is now using her medical benefits and receives Spravato  through [facility], today is the first time using it. Pt sees Angeline Sayers, NP and she will follow her throughout her treatments and follow ups. Spravato  medication is stored at treatment center per REMS/FDA guidelines. The medication is required to be locked behind two doors per REMS/FDA protocol. Medication is also disposed of properly after each use per regulations. All documentation for REMS is completed and submitted per FDA/REMS requirements.          Pt directed to treatment room, started vitals at 10:05 AM, 137/79, pulse 75, SpO2 99%. Pt did take Zofran  4 mg and Meclizine  25 mg prior to arriving at office, an hour ahead. Instructed patient to blow her nose if needed then recline back to a 45 degree angle if that was more comfortable for her. Gave patient first dose 28 mg nasal spray, administered in each nostril as directed and observed by nurse, waited 5 more minutes for the second and third doses. After all doses given pt did not complain of nausea. pt keeps water and some mints to help with the taste of Spravato  it gives the metallic taste. Her 40 minute check was at 10:45 AM, vital signs were 150/91, pulse 74, SpO2 98%. Pt aware she would be monitored for a total time of 120 minutes.  Discharge vitals were taken at 12:04 AM 145/89 P 69, SpO2 99%. Pt met with Verneita Cooks, PA-C to discuss her treatment. Pt advised to relax the rest of the day. No driving, no intense activities. Verbalized understanding. Pt reports no issues today. Nurse was with pt a total of 60 minutes for clinical  assessment.  Pt instructed to call office with any problems or questions prior to next treatment. She is scheduled next Wednesday.      LOT [id number] EXP FEB 2028

## 2024-04-29 NOTE — Progress Notes (Signed)
 [facility]  Patient ID: Robin Stevenson,  MRN: [id number]  PCP: Signa Rush, MD (Inactive)  Date of Evaluation: 04/29/2024 Time spent:60 minutes  Chief Complaint:  Chief Complaint   Depression; Other    HISTORY/CURRENT STATUS: HPI for Spravato  treatment, she sees Angeline Sayers, NP who is out of the office today.  [name] is doing well w/ current meds. Feels like they're working as they should. No anhedonia.  No extreme sadness, tearfulness, or feelings of hopelessness.  Sleeps ok.  ADLs and personal hygiene are normal.   No change in memory or focus.  Appetite is nl.  Having some anxiety b/c her daughter is leaving for [place] in 2 days, for a 3 month study abroad program.  No PA.  No mania, delirium, AH/VH.  No SI/HI.  Individual Medical History/ Review of Systems: Changes? :No   Past medications for mental health diagnoses include: Spravato , Xanax , Wellbutrin , Vraylar , gabapentin , Fetzima , trazodone   Allergies: Cat dander and Brewers yeast  Current Medications:  Current Outpatient Medications:    ALPRAZolam  (XANAX ) 0.5 MG tablet, Take 1 tablet at bedtime the night before procedure. Take 1 tablet 1 hour before arrival. Bring third tablet with you to procedure., Disp: 3 tablet, Rfl: 0   atorvastatin  (LIPITOR) 20 MG tablet, Take 1 tablet (20 mg total) by mouth daily., Disp: 90 tablet, Rfl: 3   azithromycin  (ZITHROMAX ) 500 MG tablet, For non bloody diarrhea,take 2 tabs on day 1, if resolved,stop med,If diarrhea persists 1 tab on day 2 & 3. For bloody diarrhea,2 tabs on day 1 & 1 tab on day 2 & 3, Disp: 4 tablet, Rfl: 0   buPROPion  (WELLBUTRIN  XL) 150 MG 24 hr tablet, Take 1 tablet (150 mg total) by mouth every morning., Disp: 30 tablet, Rfl: 2   cariprazine  (VRAYLAR ) 1.5 MG capsule, Take 1 capsule (1.5 mg total) by mouth daily., Disp: 28 capsule, Rfl: 0   celecoxib  (CELEBREX ) 200 MG capsule, Take 1 capsule by mouth single dose as directed. Take 1 hour prior to arrival  for surgery, Disp: 1 capsule, Rfl: 0   Esketamine HCl, 84 MG Dose, (SPRAVATO , 84 MG DOSE,) 28 MG/DEVICE SOPK, Dispense X 3 (28 mg) devices for treatments every 7 days, administer intranasally, Disp: 3 each, Rfl: 3   esomeprazole (NEXIUM) 20 MG capsule, Take 20 mg by mouth once., Disp: , Rfl:    fluocinonide  ointment (LIDEX ) 0.05 %, Apply as directed to skin twice a day, Disp: 30 g, Rfl: 0   gabapentin  (NEURONTIN ) 300 MG capsule, Take 1 capsule (300 mg total) by mouth 3 (three) times daily. Take first dose 1 hour before arrival for surgery., Disp: 21 capsule, Rfl: 0   hydrocortisone  2.5 % cream, Apply a small amount to affected area twice daily for 1-2 weeks as directed., Disp: 20 g, Rfl: 0   ketoconazole  (NIZORAL ) 2 % cream, Apply a small amount to affected area on left foot/toe once a day, Disp: 30 g, Rfl: 0   Levomilnacipran  HCl ER (FETZIMA ) 120 MG CP24, Take 1 capsule by mouth daily., Disp: 90 capsule, Rfl: 3   meclizine  (ANTIVERT ) 25 MG tablet, Take 1 tablet (25 mg total) by mouth as directed prior to treatment., Disp: 60 tablet, Rfl: 0   metFORMIN  (GLUCOPHAGE -XR) 500 MG 24 hr tablet, Take 1 tablet (500 mg total) by mouth daily. Please do not fill if not picked up within 30 days., Disp: 30 tablet, Rfl: 0   Norethindrone-Ethinyl Estradiol -Fe Biphas (LO LOESTRIN FE ) 1 MG-10 MCG /  10 MCG tablet, Take 1 tablet by mouth daily., Disp: 28 tablet, Rfl: 4   traZODone  (DESYREL ) 50 MG tablet, Take 50 mg by mouth at bedtime., Disp: , Rfl:    atovaquone -proguanil (MALARONE ) 250-100 MG TABS tablet, Take 1 tablet by mouth once daily begin 2 days prearrival,take daily during stay & continue for 7 days posttravel for malaria prevention, Disp: 24 tablet, Rfl: 0   metFORMIN  (GLUCOPHAGE ) 500 MG tablet, Take 1 tablet (500 mg total) by mouth daily., Disp: 90 tablet, Rfl: 1   metFORMIN  (GLUCOPHAGE ) 500 MG tablet, Take 1 tablet (500 mg total) by mouth 2 (two) times daily., Disp: 180 tablet, Rfl: 1   metFORMIN   (GLUCOPHAGE ) 500 MG tablet, Take 1 tablet (500 mg total) by mouth 2 (two) times daily., Disp: 90 tablet, Rfl: 1   methocarbamol  (ROBAXIN ) 500 MG tablet, Take 1 tablet (500 mg total) by mouth every 6 (six) hours as needed for muscle spasms. (Patient not taking: Reported on 10/29/2023), Disp: 45 tablet, Rfl: 0   ondansetron  (ZOFRAN ) 4 MG tablet, Take 1 tablet  by mouth every 6 hours as needed for nausea, Disp: 15 tablet, Rfl: 0   ondansetron  (ZOFRAN ) 4 MG tablet, Take 1 tablet (4 mg total) by mouth every 6 (six) hours for nausea. TAKE 4 tablets 15 minutes prior to arrival for surgery, Disp: 30 tablet, Rfl: 0   ondansetron  (ZOFRAN -ODT) 4 MG disintegrating tablet, Dissolve 1 tablet (4 mg total) by mouth every 8 (eight) hours as needed for nausea or vomiting., Disp: 20 tablet, Rfl: 1   oxyCODONE  (OXY IR/ROXICODONE ) 5 MG immediate release tablet, Take 1-2 tablet by mouth every six to eight hours as needed for Post Op Pain. DO NOT EXCEED  6 tablets in 24 hours., Disp: 30 tablet, Rfl: 0   polyethylene glycol (MIRALAX / GLYCOLAX) packet, Take 17 g by mouth daily., Disp: , Rfl:    polyethylene glycol-electrolytes (NULYTELY) 420 g solution, Take 4,000 mLs by mouth Nightly., Disp: 4000 mL, Rfl: 0   Semaglutide -Weight Management (WEGOVY ) 2.4 MG/0.75ML SOAJ, Inject 2.4 mg into the skin once a week for weight loss., Disp: 3 mL, Rfl: 0   sulfamethoxazole -trimethoprim  (BACTRIM  DS) 800-160 MG tablet, Take 1 tablet by mouth 2 (two) times daily as directed. Start the day after surgery., Disp: 10 tablet, Rfl: 0   SUMAtriptan  (IMITREX ) 50 MG tablet, TAKE 1 TABLET BY MOUTH AT ONSET OF HEADACHE, MAY REPEAT 1 DOSE AFTER 2 HOURS IF NEEDED, Disp: 10 tablet, Rfl: 5   SUMAtriptan  (IMITREX ) 50 MG tablet, Take 1 tablet by mouth at onset of headache, may repeat same dose once 2 hours later if needed, Disp: 10 tablet, Rfl: 5   SUMAtriptan  (IMITREX ) 50 MG tablet, Take 1 tablet (50 mg total) by mouth at the onset of headache. May repeat 1  dose after 2 hours if needed., Disp: 10 tablet, Rfl: 5   SUMAtriptan  (IMITREX ) 50 MG tablet, Take 1 tablet (50 mg total) by mouth at onset of headache,may repeat 1 dose after 2 hours if needed, 2 to 3 times a week, Disp: 9 tablet, Rfl: 6   SUMAtriptan  (IMITREX ) 50 MG tablet, Take 1 tablet (50 mg total) by mouth as needed at onset of headache. May repeat 1 dose after 2 hours if needed. Only take 2-3 times a week., Disp: 9 tablet, Rfl: 6   terbinafine  (LAMISIL ) 250 MG tablet, Take 1 tablet (250 mg total) by mouth daily for Tinea, Disp: 14 tablet, Rfl: 0   traZODone  (DESYREL ) 50 MG  tablet, TAKE 1 TO 2 TABLETS BY MOUTH AT BEDTIME, Disp: 180 tablet, Rfl: 1   traZODone  (DESYREL ) 50 MG tablet, Take 1-2 tablets (50-100 mg total) by mouth at bedtime., Disp: 180 tablet, Rfl: 1   WEGOVY  0.5 MG/0.5ML SOAJ, Inject 0.5 mg into the skin once a week for weight loss., Disp: 2 mL, Rfl: 0   WEGOVY  1 MG/0.5ML SOAJ, Inject 1 mg into the skin once a week for weight loss, Disp: 2 mL, Rfl: 0   WEGOVY  2.4 MG/0.75ML SOAJ, Inject 2.4 mg into the skin once a week for weight loss., Disp: 3 mL, Rfl: 0   WEGOVY  2.4 MG/0.75ML SOAJ, Inject 2.4 mg into the skin once a week fro weight loss, Disp: 3 mL, Rfl: 0   WEGOVY  2.4 MG/0.75ML SOAJ, Inject 2.4 mg into the skin once a week for weight loss, Disp: 3 mL, Rfl: 0   WEGOVY  2.4 MG/0.75ML SOAJ, Inject 2.4 mg,sub-q, into the skin once a week for weight loss, Disp: 3 mL, Rfl: 0   WEGOVY  2.4 MG/0.75ML SOAJ, Inject 2.4 mg into the skin once a week for weight loss., Disp: 3 mL, Rfl: 0   WEGOVY  2.4 MG/0.75ML SOAJ, Inject 2.4 mg into the skin once a week for weight loss., Disp: 3 mL, Rfl: 0   WEGOVY  2.4 MG/0.75ML SOAJ, Inject 2.4 mg into the skin once a week for weight loss, Disp: 3 mL, Rfl: 0 Medication Side Effects: none  Family Medical/ Social History: Changes?   No  MENTAL HEALTH EXAM:  Last menstrual period 04/01/2024.There is no height or weight on file to calculate BMI.  General  Appearance: Casual and Well Groomed  Eye Contact:  Good  Speech:  Clear and Coherent and Normal Rate  Volume:  Normal  Mood:  Euthymic  Affect:  Congruent  Thought Process:  Goal Directed and Descriptions of Associations: Circumstantial  Orientation:  Full (Time, Place, and Person)  Thought Content: Logical   Suicidal Thoughts:  No  Homicidal Thoughts:  No  Memory:  WNL  Judgement:  Good  Insight:  Good  Psychomotor Activity:  Normal  Concentration:  Concentration: Good and Attention Span: Good  Recall:  Good  Fund of Knowledge: Good  Language: Good  Assets:  Communication Skills Desire for Improvement Financial Resources/Insurance Housing Leisure Time Resilience Transportation Vocational/Educational  ADL's:  Intact  Cognition: WNL  Prognosis:  Good   ECT-MADRS    Flowsheet Row Video Visit from 03/20/2024 in [facility] Clinical Support from 01/23/2024 in [facility]  MADRS Total Score 20 18   Patient was administered Spravato  84 mg intranasally today.  The patient experienced the typical dissociation which gradually resolved over the 2-hour period of observation.  There were no complications.  Specifically the patient did not have nausea or vomiting or headache.  Blood pressures remained within normal ranges at the 40-minute and 2-hour follow-up intervals.  Pulse ox were nl. By the time the 2-hour observation period was met the patient was alert and oriented and able to exit without assistance.  Patient feels the Spravato  administration is helpful for the treatment resistant depression and would like to continue the treatment.  See nursing note for further details.  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 Situational anxiety  F41.8       Receiving Psychotherapy: No   RECOMMENDATIONS:   PDMP reviewed.  Spravato  filled 12/31/2023.  I provided approximately  60 minutes of face to face time during this encounter, including time spent before and after the visit in records review, medical decision making, counseling pertinent to today's visit, and charting.   She continues to respond well to the Spravato  and all other meds so no changes are needed.  The anxiety is in keeping with the circumstances. She'll take the Xanax  when needed.   Continue Xanax  0.5 mg, daily as needed. Continue Wellbutrin  XL 150 mg daily. Continue Vraylar  1.5 mg, 1 p.o. daily. Continue Spravato  weekly. Continue Fetzima  120 mg, 1 p.o. daily. Continue trazodone  50 mg, 1-2 nightly as needed sleep. Return in 1 week.     Verneita Cooks, PA-C

## 2024-05-04 ENCOUNTER — Encounter: Payer: Self-pay | Admitting: Physician Assistant

## 2024-05-06 DIAGNOSIS — H52223 Regular astigmatism, bilateral: Secondary | ICD-10-CM | POA: Diagnosis not present

## 2024-05-07 ENCOUNTER — Ambulatory Visit (INDEPENDENT_AMBULATORY_CARE_PROVIDER_SITE_OTHER)

## 2024-05-07 ENCOUNTER — Encounter: Payer: Self-pay | Admitting: Adult Health

## 2024-05-07 ENCOUNTER — Ambulatory Visit (INDEPENDENT_AMBULATORY_CARE_PROVIDER_SITE_OTHER): Admitting: Adult Health

## 2024-05-07 VITALS — BP 157/87 | HR 70

## 2024-05-07 DIAGNOSIS — F339 Major depressive disorder, recurrent, unspecified: Secondary | ICD-10-CM

## 2024-05-07 NOTE — Progress Notes (Signed)
 Robin Stevenson [id number] 15-Jul-1976 48 y.o.  Subjective:   Patient ID:  Robin Stevenson is a 48 y.o. (DOB Oct 03, 1975) female.  Chief Complaint: No chief complaint on file.   HPI [name] presents to the office today for follow-up of (TRD).   Spravato  treatment    Patient was administered Spravato  84 mg intranasally today. Patient was observed by provider throughout Spravato  treatment. The patient experienced the typical dissociation which gradually resolved over the 2-hour period of observation. There were no complications. Specifically, the patient did not have any untoward side effects - feeling disconnected from themself, their thoughts, feelings and things around them, dizziness, nausea, feeling sleepy, decreased feeling of sensitivity (numbness) spinning sensation, feeling anxious, lack of energy, increased blood pressure, feeling happy or very excited, or headache. Blood pressures remained within normal ranges at the 40-minute and 2-hour follow-up intervals. By the time the 2-hour observation period was met the patient was alert and oriented and able to exit without assistance. 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 See nursing note for further details.  [name] reports a positive experience today with Spravato .   Describes mood today as ok. Pleasant. Mood symptoms - denies depression, feels like the Spravato  treatments are helpful for mood symptoms. Denies anxiety and irritability. Reports varying interest and motivation. Denies panic attacks. Reports some worry. Denies rumination and over thinking. Denies obsessive thoughts or acts. Reports mood as improved with current treatment regimen. Taking medications as prescribed. Energy levels lower. Active, does not have a regular exercise routine.   Enjoys some usual interests and activities. Plans to marry in October. Appetite adequate. Weight stable. Sleeps well most  nights. Averages 9 to 10 hours. Focus and concentration stable. Completing tasks. Managing aspects of household.  Denies SI or HI.  Denies AH or VH.   ECT-MADRS    Flowsheet Row Video Visit from 03/20/2024 in [facility] Clinical Support from 01/23/2024 in [facility]  MADRS Total Score 20 18     Review of Systems:  Review of Systems  Musculoskeletal:  Negative for gait problem.  Neurological:  Negative for tremors.  Psychiatric/Behavioral:         Please refer to HPI    Medications: I have reviewed the patient's current medications.  Current Outpatient Medications  Medication Sig Dispense Refill   ALPRAZolam  (XANAX ) 0.5 MG tablet Take 1 tablet at bedtime the night before procedure. Take 1 tablet 1 hour before arrival. Bring third tablet with you to procedure. 3 tablet 0   atorvastatin  (LIPITOR) 20 MG tablet Take 1 tablet (20 mg total) by mouth daily. 90 tablet 3   atovaquone -proguanil (MALARONE ) 250-100 MG TABS tablet Take 1 tablet by mouth once daily begin 2 days prearrival,take daily during stay & continue for 7 days posttravel for malaria prevention 24 tablet 0   azithromycin  (ZITHROMAX ) 500 MG tablet For non bloody diarrhea,take 2 tabs on day 1, if resolved,stop med,If diarrhea persists 1 tab on day 2 & 3. For bloody diarrhea,2 tabs on day 1 & 1 tab on day 2 & 3 4 tablet 0   buPROPion  (WELLBUTRIN  XL) 150 MG 24 hr tablet Take 1 tablet (150 mg total) by mouth every morning. 30 tablet 2   cariprazine  (VRAYLAR ) 1.5 MG capsule Take 1 capsule (1.5 mg total) by mouth daily. 28 capsule 0   celecoxib  (CELEBREX ) 200 MG capsule Take 1 capsule by mouth single dose as directed. Take 1  hour prior to arrival for surgery 1 capsule 0   Esketamine HCl, 84 MG Dose, (SPRAVATO , 84 MG DOSE,) 28 MG/DEVICE SOPK Dispense X 3 (28 mg) devices for treatments every 7 days, administer intranasally 3 each 3   esomeprazole (NEXIUM) 20 MG capsule Take 20 mg  by mouth once.     fluocinonide  ointment (LIDEX ) 0.05 % Apply as directed to skin twice a day 30 g 0   gabapentin  (NEURONTIN ) 300 MG capsule Take 1 capsule (300 mg total) by mouth 3 (three) times daily. Take first dose 1 hour before arrival for surgery. 21 capsule 0   hydrocortisone  2.5 % cream Apply a small amount to affected area twice daily for 1-2 weeks as directed. 20 g 0   ketoconazole  (NIZORAL ) 2 % cream Apply a small amount to affected area on left foot/toe once a day 30 g 0   Levomilnacipran  HCl ER (FETZIMA ) 120 MG CP24 Take 1 capsule by mouth daily. 90 capsule 3   meclizine  (ANTIVERT ) 25 MG tablet Take 1 tablet (25 mg total) by mouth as directed prior to treatment. 60 tablet 0   metFORMIN  (GLUCOPHAGE ) 500 MG tablet Take 1 tablet (500 mg total) by mouth daily. 90 tablet 1   metFORMIN  (GLUCOPHAGE ) 500 MG tablet Take 1 tablet (500 mg total) by mouth 2 (two) times daily. 180 tablet 1   metFORMIN  (GLUCOPHAGE ) 500 MG tablet Take 1 tablet (500 mg total) by mouth 2 (two) times daily. 90 tablet 1   metFORMIN  (GLUCOPHAGE -XR) 500 MG 24 hr tablet Take 1 tablet (500 mg total) by mouth daily. Please do not fill if not picked up within 30 days. 30 tablet 0   methocarbamol  (ROBAXIN ) 500 MG tablet Take 1 tablet (500 mg total) by mouth every 6 (six) hours as needed for muscle spasms. (Patient not taking: Reported on 10/29/2023) 45 tablet 0   Norethindrone-Ethinyl Estradiol -Fe Biphas (LO LOESTRIN FE ) 1 MG-10 MCG / 10 MCG tablet Take 1 tablet by mouth daily. 28 tablet 4   ondansetron  (ZOFRAN ) 4 MG tablet Take 1 tablet  by mouth every 6 hours as needed for nausea 15 tablet 0   ondansetron  (ZOFRAN ) 4 MG tablet Take 1 tablet (4 mg total) by mouth every 6 (six) hours for nausea. TAKE 4 tablets 15 minutes prior to arrival for surgery 30 tablet 0   ondansetron  (ZOFRAN -ODT) 4 MG disintegrating tablet Dissolve 1 tablet (4 mg total) by mouth every 8 (eight) hours as needed for nausea or vomiting. 20 tablet 1   oxyCODONE   (OXY IR/ROXICODONE ) 5 MG immediate release tablet Take 1-2 tablet by mouth every six to eight hours as needed for Post Op Pain. DO NOT EXCEED  6 tablets in 24 hours. 30 tablet 0   polyethylene glycol (MIRALAX / GLYCOLAX) packet Take 17 g by mouth daily.     polyethylene glycol-electrolytes (NULYTELY) 420 g solution Take 4,000 mLs by mouth Nightly. 4000 mL 0   Semaglutide -Weight Management (WEGOVY ) 2.4 MG/0.75ML SOAJ Inject 2.4 mg into the skin once a week for weight loss. 3 mL 0   sulfamethoxazole -trimethoprim  (BACTRIM  DS) 800-160 MG tablet Take 1 tablet by mouth 2 (two) times daily as directed. Start the day after surgery. 10 tablet 0   SUMAtriptan  (IMITREX ) 50 MG tablet TAKE 1 TABLET BY MOUTH AT ONSET OF HEADACHE, MAY REPEAT 1 DOSE AFTER 2 HOURS IF NEEDED 10 tablet 5   SUMAtriptan  (IMITREX ) 50 MG tablet Take 1 tablet by mouth at onset of headache, may repeat same dose once  2 hours later if needed 10 tablet 5   SUMAtriptan  (IMITREX ) 50 MG tablet Take 1 tablet (50 mg total) by mouth at the onset of headache. May repeat 1 dose after 2 hours if needed. 10 tablet 5   SUMAtriptan  (IMITREX ) 50 MG tablet Take 1 tablet (50 mg total) by mouth at onset of headache,may repeat 1 dose after 2 hours if needed, 2 to 3 times a week 9 tablet 6   SUMAtriptan  (IMITREX ) 50 MG tablet Take 1 tablet (50 mg total) by mouth as needed at onset of headache. May repeat 1 dose after 2 hours if needed. Only take 2-3 times a week. 9 tablet 6   terbinafine  (LAMISIL ) 250 MG tablet Take 1 tablet (250 mg total) by mouth daily for Tinea 14 tablet 0   traZODone  (DESYREL ) 50 MG tablet Take 50 mg by mouth at bedtime.     traZODone  (DESYREL ) 50 MG tablet TAKE 1 TO 2 TABLETS BY MOUTH AT BEDTIME 180 tablet 1   traZODone  (DESYREL ) 50 MG tablet Take 1-2 tablets (50-100 mg total) by mouth at bedtime. 180 tablet 1   WEGOVY  0.5 MG/0.5ML SOAJ Inject 0.5 mg into the skin once a week for weight loss. 2 mL 0   WEGOVY  1 MG/0.5ML SOAJ Inject 1 mg into  the skin once a week for weight loss 2 mL 0   WEGOVY  2.4 MG/0.75ML SOAJ Inject 2.4 mg into the skin once a week for weight loss. 3 mL 0   WEGOVY  2.4 MG/0.75ML SOAJ Inject 2.4 mg into the skin once a week fro weight loss 3 mL 0   WEGOVY  2.4 MG/0.75ML SOAJ Inject 2.4 mg into the skin once a week for weight loss 3 mL 0   WEGOVY  2.4 MG/0.75ML SOAJ Inject 2.4 mg,sub-q, into the skin once a week for weight loss 3 mL 0   WEGOVY  2.4 MG/0.75ML SOAJ Inject 2.4 mg into the skin once a week for weight loss. 3 mL 0   WEGOVY  2.4 MG/0.75ML SOAJ Inject 2.4 mg into the skin once a week for weight loss. 3 mL 0   WEGOVY  2.4 MG/0.75ML SOAJ Inject 2.4 mg into the skin once a week for weight loss 3 mL 0   No current facility-administered medications for this visit.    Medication Side Effects: None  Allergies:  Allergies  Allergen Reactions   Cat Dander Anaphylaxis   Brewers Yeast Rash    Past Medical History:  Diagnosis Date   Depression    takes Welbutrin and Fetzima    GERD (gastroesophageal reflux disease)    takes nexium    Past Medical History, Surgical history, Social history, and Family history were reviewed and updated as appropriate.   Please see review of systems for further details on the patient's review from today.   Objective:   Physical Exam:  LMP 04/01/2024   Physical Exam Constitutional:      General: She is not in acute distress. Musculoskeletal:        General: No deformity.  Neurological:     Mental Status: She is alert and oriented to person, place, and time.     Coordination: Coordination normal.  Psychiatric:        Attention and Perception: Attention and perception normal. She does not perceive auditory or visual hallucinations.        Mood and Affect: Mood normal. Mood is not anxious or depressed. Affect is not labile, blunt, angry or inappropriate.        Speech:  Speech normal.        Behavior: Behavior normal.        Thought Content: Thought content normal.  Thought content is not paranoid or delusional. Thought content does not include homicidal or suicidal ideation. Thought content does not include homicidal or suicidal plan.        Cognition and Memory: Cognition and memory normal.        Judgment: Judgment normal.     Comments: Insight intact     Lab Review:     Component Value Date/Time   NA 140 09/01/2016 0852   K 4.4 09/01/2016 0852   CL 104 09/01/2016 0852   CO2 31 09/01/2016 0852   GLUCOSE 90 09/01/2016 0852   BUN 14 09/01/2016 0852   CREATININE 0.86 09/01/2016 0852   CALCIUM  9.2 09/01/2016 0852   PROT 7.0 09/01/2016 0852   ALBUMIN 4.3 09/01/2016 0852   AST 33 09/01/2016 0852   ALT 70 (H) 09/01/2016 0852   ALKPHOS 66 09/01/2016 0852   BILITOT 0.7 09/01/2016 0852       Component Value Date/Time   WBC 5.1 09/01/2016 0852   RBC 4.30 09/01/2016 0852   HGB 13.5 09/01/2016 0852   HCT 38.9 09/01/2016 0852   PLT 286.0 09/01/2016 0852   MCV 90.4 09/01/2016 0852   MCHC 34.6 09/01/2016 0852   RDW 11.9 09/01/2016 0852   LYMPHSABS 1.5 09/01/2016 0852   MONOABS 0.4 09/01/2016 0852   EOSABS 0.1 09/01/2016 0852   BASOSABS 0.0 09/01/2016 0852    No results found for: POCLITH, LITHIUM   No results found for: PHENYTOIN, PHENOBARB, VALPROATE, CBMZ   .res Assessment: Plan:    RECOMMENDATIONS:    PDMP reviewed. Spravato  filled 05/06/2024.  I provided approximately 40 minutes of face to face time during this encounter, including time spent before and after the visit in records review, medical decision making, counseling pertinent to today's visit, and charting.   She continues to respond well to the Spravato .   Continue: Xanax  0.5 mg, daily as needed. Wellbutrin  XL 150 mg daily. Vraylar  1.5 mg, 1 p.o. daily. Fetzima  120 mg, 1 p.o. daily. Ttrazodone 50 mg, 1-2 nightly as needed sleep.  Spravato  weekly  Return in 1 week.     There are no diagnoses linked to this encounter.   Please see After Visit Summary  for patient specific instructions.  No future appointments.  No orders of the defined types were placed in this encounter.   -------------------------------

## 2024-05-07 NOTE — Progress Notes (Signed)
 NURSES NOTE:   Pt arrived for her # 51 Spravato  Treatment for treatment resistant depression, the starting dose was 56 mg (2 of the 28 mg nasal sprays) pt stayed at that dose for the first 4 treatments and also takes Zofran  and Meclizine  prior to arrival at office. Pt received 84 mg (3 of the 28 mg nasal sprays) total again today and did also premedicate. Pt is now using her medical benefits and receives Spravato  through [facility] and [facility], today is the first time using it. Pt sees Angeline Sayers, NP and she will follow her throughout her treatments and follow ups. Spravato  medication is stored at treatment center per REMS/FDA guidelines. The medication is required to be locked behind two doors per REMS/FDA protocol. Medication is also disposed of properly after each use per regulations. All documentation for REMS is completed and submitted per FDA/REMS requirements.          Pt directed to treatment room, started vitals at 10:00 AM, 128/79, pulse 77, SpO2 99%. Pt did take Zofran  4 mg and Meclizine  25 mg prior to arriving at office, an hour ahead. Instructed patient to blow her nose if needed then recline back to a 45 degree angle if that was more comfortable for her. Gave patient first dose 28 mg nasal spray, administered in each nostril as directed and observed by nurse, waited 5 more minutes for the second and third doses. After all doses given pt did not complain of nausea. pt keeps water and some mints to help with the taste of Spravato  it gives the metallic taste. Her 40 minute check was at 10:45 AM, vital signs were 155/90, pulse 75, SpO2 99%. Pt aware she would be monitored for a total time of 120 minutes.  Discharge vitals were taken at 11:56 AM 157/87 P 70, SpO2 97%. Pt met with Angeline Sayers, NP to discuss her treatment. Pt advised to relax the rest of the day. No driving, no intense activities. Verbalized understanding. Pt reports no issues today. Nurse was with pt a total of 60 minutes for clinical  assessment.  Pt instructed to call office with any problems or questions prior to next treatment. She is scheduled next Tuesday.      LOT [id number] EXP JUN 2028

## 2024-05-13 ENCOUNTER — Encounter: Admitting: Physician Assistant

## 2024-05-13 ENCOUNTER — Ambulatory Visit

## 2024-05-13 ENCOUNTER — Encounter: Payer: Self-pay | Admitting: Physician Assistant

## 2024-05-13 ENCOUNTER — Ambulatory Visit (INDEPENDENT_AMBULATORY_CARE_PROVIDER_SITE_OTHER): Admitting: Physician Assistant

## 2024-05-13 VITALS — BP 129/80 | HR 76

## 2024-05-13 DIAGNOSIS — F339 Major depressive disorder, recurrent, unspecified: Secondary | ICD-10-CM | POA: Diagnosis not present

## 2024-05-13 DIAGNOSIS — F418 Other specified anxiety disorders: Secondary | ICD-10-CM

## 2024-05-13 NOTE — Progress Notes (Signed)
 NURSES NOTE:   Pt arrived for her # 52 Spravato  Treatment for treatment resistant depression, the starting dose was 56 mg (2 of the 28 mg nasal sprays) pt stayed at that dose for the first 4 treatments and also takes Zofran  and Meclizine  prior to arrival at office. Pt received 84 mg (3 of the 28 mg nasal sprays) total again today and did also premedicate. Pt is now using her medical benefits and receives Spravato  through [facility] and [facility], today is the first time using it. Pt sees Angeline Sayers, NP and she will follow her throughout her treatments and follow ups. Spravato  medication is stored at treatment center per REMS/FDA guidelines. The medication is required to be locked behind two doors per REMS/FDA protocol. Medication is also disposed of properly after each use per regulations. All documentation for REMS is completed and submitted per FDA/REMS requirements.          Pt directed to treatment room, started vitals at 1:05 PM, 156/96, pulse 82, SpO2 99%. Pt did take Zofran  4 mg and Meclizine  25 mg prior to arriving at office, an hour ahead. Instructed patient to blow her nose if needed then recline back to a 45 degree angle if that was more comfortable for her. Gave patient first dose 28 mg nasal spray, administered in each nostril as directed and observed by nurse, waited 5 more minutes for the second and third doses. After all doses given pt did not complain of nausea. pt keeps water and some mints to help with the taste of Spravato  it gives the metallic taste. Her 40 minute check was at 1:48 PM, vital signs were 148/85, pulse 79, SpO2 98%. Pt aware she would be monitored for a total time of 120 minutes.  Discharge vitals were taken at 3:05 PM 129/80 P 76, SpO2 99%. Pt met with Verneita Cooks, PA-C to discuss her treatment. Pt advised to relax the rest of the day. No driving, no intense activities. Verbalized understanding. Pt reports no issues today. Nurse was with pt a total of 60 minutes for clinical  assessment.  Pt instructed to call office with any problems or questions prior to next treatment. She is scheduled next Tuesday.      LOT [id number] EXP [facility] 2028

## 2024-05-13 NOTE — Progress Notes (Unsigned)
 [facility]  Patient ID: Robin Stevenson,  MRN: [id number]  PCP: Robin Rush, MD (Inactive)  Date of Evaluation: 05/13/2024 Time spent:65 minutes  Chief Complaint:  Chief Complaint   Depression; Other    HISTORY/CURRENT STATUS: HPI for Spravato  treatment, she sees Robin Sayers, NP who is out of the office today.  Robin Stevenson is doing well and is still experiencing benefit from weekly Spravato  treatments. Energy and motivation are good.  No extreme sadness, tearfulness, or feelings of hopelessness.  She gets anxious about her daughter studying abroad in [place] this summer.  But it's not as bad as it was and she's able to work through it fine.  Sleeps well most of the time. ADLs and personal hygiene are normal.   Denies any changes in concentration, making decisions, or remembering things.  Appetite has not changed.   No mania, delirium, AH/VH.  No SI/HI.  Individual Medical History/ Review of Systems: Changes? :No   Past medications for mental health diagnoses include: Spravato , Xanax , Wellbutrin , Vraylar , gabapentin , Fetzima , trazodone   Allergies: Cat dander and Brewers yeast  Current Medications:  Current Outpatient Medications:    ALPRAZolam  (XANAX ) 0.5 MG tablet, Take 1 tablet at bedtime the night before procedure. Take 1 tablet 1 hour before arrival. Bring third tablet with you to procedure., Disp: 3 tablet, Rfl: 0   atorvastatin  (LIPITOR) 20 MG tablet, Take 1 tablet (20 mg total) by mouth daily., Disp: 90 tablet, Rfl: 3   atovaquone -proguanil (MALARONE ) 250-100 MG TABS tablet, Take 1 tablet by mouth once daily begin 2 days prearrival,take daily during stay & continue for 7 days posttravel for malaria prevention, Disp: 24 tablet, Rfl: 0   buPROPion  (WELLBUTRIN  XL) 150 MG 24 hr tablet, Take 1 tablet (150 mg total) by mouth every morning., Disp: 30 tablet, Rfl: 2   cariprazine  (VRAYLAR ) 1.5 MG capsule, Take 1 capsule (1.5 mg total) by mouth daily., Disp: 28 capsule,  Rfl: 0   celecoxib  (CELEBREX ) 200 MG capsule, Take 1 capsule by mouth single dose as directed. Take 1 hour prior to arrival for surgery, Disp: 1 capsule, Rfl: 0   Esketamine HCl, 84 MG Dose, (SPRAVATO , 84 MG DOSE,) 28 MG/DEVICE SOPK, Dispense X 3 (28 mg) devices for treatments every 7 days, administer intranasally, Disp: 3 each, Rfl: 3   esomeprazole (NEXIUM) 20 MG capsule, Take 20 mg by mouth once., Disp: , Rfl:    fluocinonide  ointment (LIDEX ) 0.05 %, Apply as directed to skin twice a day, Disp: 30 g, Rfl: 0   gabapentin  (NEURONTIN ) 300 MG capsule, Take 1 capsule (300 mg total) by mouth 3 (three) times daily. Take first dose 1 hour before arrival for surgery., Disp: 21 capsule, Rfl: 0   hydrocortisone  2.5 % cream, Apply a small amount to affected area twice daily for 1-2 weeks as directed., Disp: 20 g, Rfl: 0   ketoconazole  (NIZORAL ) 2 % cream, Apply a small amount to affected area on left foot/toe once a day, Disp: 30 g, Rfl: 0   Levomilnacipran  HCl ER (FETZIMA ) 120 MG CP24, Take 1 capsule by mouth daily., Disp: 90 capsule, Rfl: 3   meclizine  (ANTIVERT ) 25 MG tablet, Take 1 tablet (25 mg total) by mouth as directed prior to treatment., Disp: 60 tablet, Rfl: 0   SUMAtriptan  (IMITREX ) 50 MG tablet, Take 1 tablet (50 mg total) by mouth as needed at onset of headache. May repeat 1 dose after 2 hours if needed. Only take 2-3 times a week., Disp: 9 tablet, Rfl: 6  traZODone  (DESYREL ) 50 MG tablet, Take 50 mg by mouth at bedtime., Disp: , Rfl:    azithromycin  (ZITHROMAX ) 500 MG tablet, For non bloody diarrhea,take 2 tabs on day 1, if resolved,stop med,If diarrhea persists 1 tab on day 2 & 3. For bloody diarrhea,2 tabs on day 1 & 1 tab on day 2 & 3, Disp: 4 tablet, Rfl: 0   metFORMIN  (GLUCOPHAGE ) 500 MG tablet, Take 1 tablet (500 mg total) by mouth daily., Disp: 90 tablet, Rfl: 1   metFORMIN  (GLUCOPHAGE ) 500 MG tablet, Take 1 tablet (500 mg total) by mouth 2 (two) times daily., Disp: 180 tablet, Rfl: 1    metFORMIN  (GLUCOPHAGE ) 500 MG tablet, Take 1 tablet (500 mg total) by mouth 2 (two) times daily., Disp: 90 tablet, Rfl: 1   metFORMIN  (GLUCOPHAGE -XR) 500 MG 24 hr tablet, Take 1 tablet (500 mg total) by mouth daily. Please do not fill if not picked up within 30 days., Disp: 30 tablet, Rfl: 0   methocarbamol  (ROBAXIN ) 500 MG tablet, Take 1 tablet (500 mg total) by mouth every 6 (six) hours as needed for muscle spasms. (Patient not taking: Reported on 10/29/2023), Disp: 45 tablet, Rfl: 0   Norethindrone-Ethinyl Estradiol -Fe Biphas (LO LOESTRIN FE ) 1 MG-10 MCG / 10 MCG tablet, Take 1 tablet by mouth daily., Disp: 28 tablet, Rfl: 4   ondansetron  (ZOFRAN ) 4 MG tablet, Take 1 tablet  by mouth every 6 hours as needed for nausea, Disp: 15 tablet, Rfl: 0   ondansetron  (ZOFRAN ) 4 MG tablet, Take 1 tablet (4 mg total) by mouth every 6 (six) hours for nausea. TAKE 4 tablets 15 minutes prior to arrival for surgery, Disp: 30 tablet, Rfl: 0   ondansetron  (ZOFRAN -ODT) 4 MG disintegrating tablet, Dissolve 1 tablet (4 mg total) by mouth every 8 (eight) hours as needed for nausea or vomiting., Disp: 20 tablet, Rfl: 1   oxyCODONE  (OXY IR/ROXICODONE ) 5 MG immediate release tablet, Take 1-2 tablet by mouth every six to eight hours as needed for Post Op Pain. DO NOT EXCEED  6 tablets in 24 hours., Disp: 30 tablet, Rfl: 0   polyethylene glycol (MIRALAX / GLYCOLAX) packet, Take 17 g by mouth daily., Disp: , Rfl:    polyethylene glycol-electrolytes (NULYTELY) 420 g solution, Take 4,000 mLs by mouth Nightly., Disp: 4000 mL, Rfl: 0   Semaglutide -Weight Management (WEGOVY ) 2.4 MG/0.75ML SOAJ, Inject 2.4 mg into the skin once a week for weight loss., Disp: 3 mL, Rfl: 0   sulfamethoxazole -trimethoprim  (BACTRIM  DS) 800-160 MG tablet, Take 1 tablet by mouth 2 (two) times daily as directed. Start the day after surgery., Disp: 10 tablet, Rfl: 0   SUMAtriptan  (IMITREX ) 50 MG tablet, TAKE 1 TABLET BY MOUTH AT ONSET OF HEADACHE, MAY REPEAT 1  DOSE AFTER 2 HOURS IF NEEDED, Disp: 10 tablet, Rfl: 5   SUMAtriptan  (IMITREX ) 50 MG tablet, Take 1 tablet by mouth at onset of headache, may repeat same dose once 2 hours later if needed, Disp: 10 tablet, Rfl: 5   SUMAtriptan  (IMITREX ) 50 MG tablet, Take 1 tablet (50 mg total) by mouth at the onset of headache. May repeat 1 dose after 2 hours if needed., Disp: 10 tablet, Rfl: 5   SUMAtriptan  (IMITREX ) 50 MG tablet, Take 1 tablet (50 mg total) by mouth at onset of headache,may repeat 1 dose after 2 hours if needed, 2 to 3 times a week, Disp: 9 tablet, Rfl: 6   terbinafine  (LAMISIL ) 250 MG tablet, Take 1 tablet (250 mg total) by  mouth daily for Tinea, Disp: 14 tablet, Rfl: 0   traZODone  (DESYREL ) 50 MG tablet, TAKE 1 TO 2 TABLETS BY MOUTH AT BEDTIME, Disp: 180 tablet, Rfl: 1   traZODone  (DESYREL ) 50 MG tablet, Take 1-2 tablets (50-100 mg total) by mouth at bedtime., Disp: 180 tablet, Rfl: 1   WEGOVY  0.5 MG/0.5ML SOAJ, Inject 0.5 mg into the skin once a week for weight loss., Disp: 2 mL, Rfl: 0   WEGOVY  1 MG/0.5ML SOAJ, Inject 1 mg into the skin once a week for weight loss, Disp: 2 mL, Rfl: 0   WEGOVY  2.4 MG/0.75ML SOAJ, Inject 2.4 mg into the skin once a week for weight loss., Disp: 3 mL, Rfl: 0   WEGOVY  2.4 MG/0.75ML SOAJ, Inject 2.4 mg into the skin once a week fro weight loss, Disp: 3 mL, Rfl: 0   WEGOVY  2.4 MG/0.75ML SOAJ, Inject 2.4 mg into the skin once a week for weight loss, Disp: 3 mL, Rfl: 0   WEGOVY  2.4 MG/0.75ML SOAJ, Inject 2.4 mg,sub-q, into the skin once a week for weight loss, Disp: 3 mL, Rfl: 0   WEGOVY  2.4 MG/0.75ML SOAJ, Inject 2.4 mg into the skin once a week for weight loss., Disp: 3 mL, Rfl: 0   WEGOVY  2.4 MG/0.75ML SOAJ, Inject 2.4 mg into the skin once a week for weight loss., Disp: 3 mL, Rfl: 0   WEGOVY  2.4 MG/0.75ML SOAJ, Inject 2.4 mg into the skin once a week for weight loss, Disp: 3 mL, Rfl: 0 Medication Side Effects: none  Family Medical/ Social History: Changes?    No  MENTAL HEALTH EXAM:  Last menstrual period 04/01/2024.There is no height or weight on file to calculate BMI.  General Appearance: Casual and Well Groomed  Eye Contact:  Good  Speech:  Clear and Coherent and Normal Rate  Volume:  Normal  Mood:  Euthymic  Affect:  Congruent  Thought Process:  Goal Directed and Descriptions of Associations: Circumstantial  Orientation:  Full (Time, Place, and Person)  Thought Content: Logical   Suicidal Thoughts:  No  Homicidal Thoughts:  No  Memory:  WNL  Judgement:  Good  Insight:  Good  Psychomotor Activity:  Normal  Concentration:  Concentration: Good and Attention Span: Good  Recall:  Good  Fund of Knowledge: Good  Language: Good  Assets:  Communication Skills Desire for Improvement Financial Resources/Insurance Housing Leisure Time Resilience Transportation Vocational/Educational  ADL's:  Intact  Cognition: WNL  Prognosis:  Good   ECT-MADRS    Flowsheet Row Video Visit from 03/20/2024 in [facility] Clinical Support from 01/23/2024 in [facility]  MADRS Total Score 20 18   Patient was administered Spravato  84 mg intranasally today.  The patient experienced the typical dissociation which gradually resolved over the 2-hour period of observation.  There were no complications.  Specifically the patient did not have nausea or vomiting or headache.  Blood pressures remained within normal ranges at the 40-minute and 2-hour follow-up intervals.  Pulse ox were nl. By the time the 2-hour observation period was met the patient was alert and oriented and able to exit without assistance.  Patient feels the Spravato  administration is helpful for the treatment resistant depression and would like to continue the treatment.  See nursing note for further details.  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 Situational anxiety  F41.8  Receiving Psychotherapy: No   RECOMMENDATIONS:   PDMP reviewed.  Spravato  filled 12/31/2023. I provided approximately  65  minutes of face to face time during this encounter, including time spent before and after the visit in records review, medical decision making, counseling pertinent to today's visit, and charting.   She's stable on the current treatment.  No changes will be made.   Continue Xanax  0.5 mg, daily as needed. Continue Wellbutrin  XL 150 mg daily. Continue Vraylar  1.5 mg, 1 p.o. daily. Continue Spravato  weekly. Continue Fetzima  120 mg, 1 p.o. daily. Continue trazodone  50 mg, 1-2 nightly as needed sleep. Return in 1 week.     Verneita Cooks, PA-C

## 2024-05-14 ENCOUNTER — Encounter: Payer: Self-pay | Admitting: Physician Assistant

## 2024-05-20 ENCOUNTER — Encounter: Payer: Self-pay | Admitting: Physician Assistant

## 2024-05-20 ENCOUNTER — Ambulatory Visit

## 2024-05-20 ENCOUNTER — Other Ambulatory Visit (HOSPITAL_COMMUNITY): Payer: Self-pay

## 2024-05-20 ENCOUNTER — Ambulatory Visit (INDEPENDENT_AMBULATORY_CARE_PROVIDER_SITE_OTHER): Admitting: Physician Assistant

## 2024-05-20 VITALS — BP 141/90 | HR 70

## 2024-05-20 DIAGNOSIS — F411 Generalized anxiety disorder: Secondary | ICD-10-CM | POA: Diagnosis not present

## 2024-05-20 DIAGNOSIS — F339 Major depressive disorder, recurrent, unspecified: Secondary | ICD-10-CM

## 2024-05-20 NOTE — Progress Notes (Signed)
 NURSES NOTE:   Pt arrived for her # 53 Spravato  Treatment for treatment resistant depression, the starting dose was 56 mg (2 of the 28 mg nasal sprays) pt stayed at that dose for the first 4 treatments and also takes Zofran  and Meclizine  prior to arrival at office. Pt received 84 mg (3 of the 28 mg nasal sprays) total again today and did also premedicate. Pt is now using her medical benefits and receives Spravato  through [facility], today is the first time using it. Pt sees Angeline Sayers, NP and she will follow her throughout her treatments and follow ups. Spravato  medication is stored at treatment center per REMS/FDA guidelines. The medication is required to be locked behind two doors per REMS/FDA protocol. Medication is also disposed of properly after each use per regulations. All documentation for REMS is completed and submitted per FDA/REMS requirements.          Pt directed to treatment room, started vitals at 10:08 AM, 140/82, pulse 82, SpO2 99%. Pt did take Zofran  4 mg and Meclizine  25 mg prior to arriving at office, an hour ahead. Instructed patient to blow her nose if needed then recline back to a 45 degree angle if that was more comfortable for her. Gave patient first dose 28 mg nasal spray, administered in each nostril as directed and observed by nurse, waited 5 more minutes for the second and third doses. After all doses given pt did not complain of nausea. pt keeps water and some mints to help with the taste of Spravato  it gives the metallic taste. Her 40 minute check was at 10:55 AM, vital signs were 141/92, pulse 78, SpO2 987. Pt aware she would be monitored for a total time of 120 minutes.  Discharge vitals were taken at 12:00 PM 141/90 P 70, SpO2 99%. Pt met with Verneita Cooks, PA-C to discuss her treatment. Pt advised to relax the rest of the day. No driving, no intense activities. Verbalized understanding. Pt reports no issues today. Nurse was with pt a total of 60 minutes for clinical  assessment.  Pt instructed to call office with any problems or questions prior to next treatment. She is scheduled next Wednesday.      LOT [id number] EXP [facility] 2028

## 2024-05-20 NOTE — Progress Notes (Signed)
 [facility]  Patient ID: Robin Stevenson,  MRN: [id number]  PCP: Robin Rush, MD (Inactive)  Date of Evaluation: 05/20/2024 Time spent:60 minutes  Chief Complaint:  Chief Complaint   Depression; Other    HISTORY/CURRENT STATUS: HPI for Spravato  treatment, she sees Robin Sayers, NP who is out of the office today.  Robin Stevenson is doing well.  Spravato  continues to be beneficial. No sx of depression. Her daughter is in [place] for the fall semester and is having a great experience. Energy and motivation are good. For the most part.  No extreme sadness, tearfulness, or feelings of hopelessness.  Sleeping better.  ADLs and personal hygiene are normal.   Denies any changes in concentration, making decisions, or remembering things. No mania, delirium, AH/VH.  No SI/HI.  Individual Medical History/ Review of Systems: Changes? :No   Past medications for mental health diagnoses include: Spravato , Xanax , Wellbutrin , Vraylar , gabapentin , Fetzima , trazodone   Allergies: Cat dander and Brewers yeast  Current Medications:  Current Outpatient Medications:    ALPRAZolam  (XANAX ) 0.5 MG tablet, Take 1 tablet at bedtime the night before procedure. Take 1 tablet 1 hour before arrival. Bring third tablet with you to procedure., Disp: 3 tablet, Rfl: 0   atorvastatin  (LIPITOR) 20 MG tablet, Take 1 tablet (20 mg total) by mouth daily., Disp: 90 tablet, Rfl: 3   atovaquone -proguanil (MALARONE ) 250-100 MG TABS tablet, Take 1 tablet by mouth once daily begin 2 days prearrival,take daily during stay & continue for 7 days posttravel for malaria prevention, Disp: 24 tablet, Rfl: 0   azithromycin  (ZITHROMAX ) 500 MG tablet, For non bloody diarrhea,take 2 tabs on day 1, if resolved,stop med,If diarrhea persists 1 tab on day 2 & 3. For bloody diarrhea,2 tabs on day 1 & 1 tab on day 2 & 3, Disp: 4 tablet, Rfl: 0   buPROPion  (WELLBUTRIN  XL) 150 MG 24 hr tablet, Take 1 tablet (150 mg total) by mouth every  morning., Disp: 30 tablet, Rfl: 2   cariprazine  (VRAYLAR ) 1.5 MG capsule, Take 1 capsule (1.5 mg total) by mouth daily., Disp: 28 capsule, Rfl: 0   celecoxib  (CELEBREX ) 200 MG capsule, Take 1 capsule by mouth single dose as directed. Take 1 hour prior to arrival for surgery, Disp: 1 capsule, Rfl: 0   Esketamine HCl, 84 MG Dose, (SPRAVATO , 84 MG DOSE,) 28 MG/DEVICE SOPK, Dispense X 3 (28 mg) devices for treatments every 7 days, administer intranasally, Disp: 3 each, Rfl: 3   esomeprazole (NEXIUM) 20 MG capsule, Take 20 mg by mouth once., Disp: , Rfl:    fluocinonide  ointment (LIDEX ) 0.05 %, Apply as directed to skin twice a day, Disp: 30 g, Rfl: 0   gabapentin  (NEURONTIN ) 300 MG capsule, Take 1 capsule (300 mg total) by mouth 3 (three) times daily. Take first dose 1 hour before arrival for surgery., Disp: 21 capsule, Rfl: 0   hydrocortisone  2.5 % cream, Apply a small amount to affected area twice daily for 1-2 weeks as directed., Disp: 20 g, Rfl: 0   ketoconazole  (NIZORAL ) 2 % cream, Apply a small amount to affected area on left foot/toe once a day, Disp: 30 g, Rfl: 0   Levomilnacipran  HCl ER (FETZIMA ) 120 MG CP24, Take 1 capsule by mouth daily., Disp: 90 capsule, Rfl: 3   meclizine  (ANTIVERT ) 25 MG tablet, Take 1 tablet (25 mg total) by mouth as directed prior to treatment., Disp: 60 tablet, Rfl: 0   traZODone  (DESYREL ) 50 MG tablet, Take 50 mg by mouth at  bedtime., Disp: , Rfl:    metFORMIN  (GLUCOPHAGE ) 500 MG tablet, Take 1 tablet (500 mg total) by mouth daily., Disp: 90 tablet, Rfl: 1   metFORMIN  (GLUCOPHAGE ) 500 MG tablet, Take 1 tablet (500 mg total) by mouth 2 (two) times daily., Disp: 180 tablet, Rfl: 1   metFORMIN  (GLUCOPHAGE ) 500 MG tablet, Take 1 tablet (500 mg total) by mouth 2 (two) times daily., Disp: 90 tablet, Rfl: 1   metFORMIN  (GLUCOPHAGE -XR) 500 MG 24 hr tablet, Take 1 tablet (500 mg total) by mouth daily. Please do not fill if not picked up within 30 days., Disp: 30 tablet, Rfl: 0    methocarbamol  (ROBAXIN ) 500 MG tablet, Take 1 tablet (500 mg total) by mouth every 6 (six) hours as needed for muscle spasms. (Patient not taking: Reported on 10/29/2023), Disp: 45 tablet, Rfl: 0   Norethindrone-Ethinyl Estradiol -Fe Biphas (LO LOESTRIN FE ) 1 MG-10 MCG / 10 MCG tablet, Take 1 tablet by mouth daily., Disp: 28 tablet, Rfl: 4   ondansetron  (ZOFRAN ) 4 MG tablet, Take 1 tablet  by mouth every 6 hours as needed for nausea, Disp: 15 tablet, Rfl: 0   ondansetron  (ZOFRAN ) 4 MG tablet, Take 1 tablet (4 mg total) by mouth every 6 (six) hours for nausea. TAKE 4 tablets 15 minutes prior to arrival for surgery, Disp: 30 tablet, Rfl: 0   ondansetron  (ZOFRAN -ODT) 4 MG disintegrating tablet, Dissolve 1 tablet (4 mg total) by mouth every 8 (eight) hours as needed for nausea or vomiting., Disp: 20 tablet, Rfl: 1   oxyCODONE  (OXY IR/ROXICODONE ) 5 MG immediate release tablet, Take 1-2 tablet by mouth every six to eight hours as needed for Post Op Pain. DO NOT EXCEED  6 tablets in 24 hours., Disp: 30 tablet, Rfl: 0   polyethylene glycol (MIRALAX / GLYCOLAX) packet, Take 17 g by mouth daily., Disp: , Rfl:    polyethylene glycol-electrolytes (NULYTELY) 420 g solution, Take 4,000 mLs by mouth Nightly., Disp: 4000 mL, Rfl: 0   Semaglutide -Weight Management (WEGOVY ) 2.4 MG/0.75ML SOAJ, Inject 2.4 mg into the skin once a week for weight loss., Disp: 3 mL, Rfl: 0   sulfamethoxazole -trimethoprim  (BACTRIM  DS) 800-160 MG tablet, Take 1 tablet by mouth 2 (two) times daily as directed. Start the day after surgery., Disp: 10 tablet, Rfl: 0   SUMAtriptan  (IMITREX ) 50 MG tablet, TAKE 1 TABLET BY MOUTH AT ONSET OF HEADACHE, MAY REPEAT 1 DOSE AFTER 2 HOURS IF NEEDED, Disp: 10 tablet, Rfl: 5   SUMAtriptan  (IMITREX ) 50 MG tablet, Take 1 tablet by mouth at onset of headache, may repeat same dose once 2 hours later if needed, Disp: 10 tablet, Rfl: 5   SUMAtriptan  (IMITREX ) 50 MG tablet, Take 1 tablet (50 mg total) by mouth at the  onset of headache. May repeat 1 dose after 2 hours if needed., Disp: 10 tablet, Rfl: 5   SUMAtriptan  (IMITREX ) 50 MG tablet, Take 1 tablet (50 mg total) by mouth at onset of headache,may repeat 1 dose after 2 hours if needed, 2 to 3 times a week, Disp: 9 tablet, Rfl: 6   SUMAtriptan  (IMITREX ) 50 MG tablet, Take 1 tablet (50 mg total) by mouth as needed at onset of headache. May repeat 1 dose after 2 hours if needed. Only take 2-3 times a week., Disp: 9 tablet, Rfl: 6   terbinafine  (LAMISIL ) 250 MG tablet, Take 1 tablet (250 mg total) by mouth daily for Tinea, Disp: 14 tablet, Rfl: 0   traZODone  (DESYREL ) 50 MG tablet, TAKE 1  TO 2 TABLETS BY MOUTH AT BEDTIME, Disp: 180 tablet, Rfl: 1   traZODone  (DESYREL ) 50 MG tablet, Take 1-2 tablets (50-100 mg total) by mouth at bedtime., Disp: 180 tablet, Rfl: 1   WEGOVY  0.5 MG/0.5ML SOAJ, Inject 0.5 mg into the skin once a week for weight loss., Disp: 2 mL, Rfl: 0   WEGOVY  1 MG/0.5ML SOAJ, Inject 1 mg into the skin once a week for weight loss, Disp: 2 mL, Rfl: 0   WEGOVY  2.4 MG/0.75ML SOAJ, Inject 2.4 mg into the skin once a week for weight loss., Disp: 3 mL, Rfl: 0   WEGOVY  2.4 MG/0.75ML SOAJ, Inject 2.4 mg into the skin once a week fro weight loss, Disp: 3 mL, Rfl: 0   WEGOVY  2.4 MG/0.75ML SOAJ, Inject 2.4 mg into the skin once a week for weight loss, Disp: 3 mL, Rfl: 0   WEGOVY  2.4 MG/0.75ML SOAJ, Inject 2.4 mg,sub-q, into the skin once a week for weight loss, Disp: 3 mL, Rfl: 0   WEGOVY  2.4 MG/0.75ML SOAJ, Inject 2.4 mg into the skin once a week for weight loss., Disp: 3 mL, Rfl: 0   WEGOVY  2.4 MG/0.75ML SOAJ, Inject 2.4 mg into the skin once a week for weight loss., Disp: 3 mL, Rfl: 0   WEGOVY  2.4 MG/0.75ML SOAJ, Inject 2.4 mg into the skin once a week for weight loss, Disp: 3 mL, Rfl: 0 Medication Side Effects: none  Family Medical/ Social History: Changes?   No  MENTAL HEALTH EXAM:  There were no vitals taken for this visit.There is no height or  weight on file to calculate BMI.  General Appearance: Casual and Well Groomed  Eye Contact:  Good  Speech:  Clear and Coherent and Normal Rate  Volume:  Normal  Mood:  Euthymic  Affect:  Congruent  Thought Process:  Goal Directed and Descriptions of Associations: Circumstantial  Orientation:  Full (Time, Place, and Person)  Thought Content: Logical   Suicidal Thoughts:  No  Homicidal Thoughts:  No  Memory:  WNL  Judgement:  Good  Insight:  Good  Psychomotor Activity:  Normal  Concentration:  Concentration: Good and Attention Span: Good  Recall:  Good  Fund of Knowledge: Good  Language: Good  Assets:  Communication Skills Desire for Improvement Financial Resources/Insurance Housing Leisure Time Physical Health Resilience Social Support Transportation Vocational/Educational  ADL's:  Intact  Cognition: WNL  Prognosis:  Good   ECT-MADRS    Flowsheet Row Video Visit from 03/20/2024 in [facility] Clinical Support from 01/23/2024 in [facility]  MADRS Total Score 20 18   Patient was administered Spravato  84 mg intranasally today.  The patient experienced the typical dissociation which gradually resolved over the 2-hour period of observation.  There were no complications.  Specifically the patient did not have nausea or vomiting or headache.  Blood pressures remained within normal ranges at the 40-minute and 2-hour follow-up intervals.  Pulse ox were nl. By the time the 2-hour observation period was met the patient was alert and oriented and able to exit without assistance.  Patient feels the Spravato  administration is helpful for the treatment resistant depression and would like to continue the treatment.  See nursing note for further details.  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 Generalized anxiety disorder  F41.1       Receiving Psychotherapy: No    RECOMMENDATIONS:  PDMP reviewed.  Spravato  filled 12/31/2023. I provided approximately  60 minutes of face to face time during this encounter, including time spent before and after the visit in records review, medical decision making, counseling pertinent to today's visit, and charting.   Doing well on current meds so no changes are needed.   Continue Xanax  0.5 mg, daily as needed. Continue Wellbutrin  XL 150 mg daily. Continue Vraylar  1.5 mg, 1 p.o. daily. Continue Spravato  weekly. Continue Fetzima  120 mg, 1 p.o. daily. Continue trazodone  50 mg, 1-2 nightly as needed sleep. Return in 1 week.     Verneita Cooks, PA-C

## 2024-05-27 ENCOUNTER — Other Ambulatory Visit: Payer: Self-pay

## 2024-05-27 ENCOUNTER — Other Ambulatory Visit: Payer: Self-pay | Admitting: Adult Health

## 2024-05-27 ENCOUNTER — Other Ambulatory Visit (HOSPITAL_COMMUNITY): Payer: Self-pay

## 2024-05-27 MED ORDER — TRAZODONE HCL 50 MG PO TABS
50.0000 mg | ORAL_TABLET | Freq: Every day | ORAL | 1 refills | Status: AC
  Filled 2024-05-27: qty 180, 90d supply, fill #0

## 2024-05-28 ENCOUNTER — Ambulatory Visit

## 2024-05-28 ENCOUNTER — Ambulatory Visit: Admitting: Physician Assistant

## 2024-05-28 VITALS — BP 154/95 | HR 74

## 2024-05-28 DIAGNOSIS — F339 Major depressive disorder, recurrent, unspecified: Secondary | ICD-10-CM | POA: Diagnosis not present

## 2024-05-28 DIAGNOSIS — F411 Generalized anxiety disorder: Secondary | ICD-10-CM | POA: Diagnosis not present

## 2024-05-28 NOTE — Progress Notes (Unsigned)
 [facility]  Patient ID: Robin Stevenson,  MRN: [id number]  PCP: Robin Rush, MD (Inactive)  Date of Evaluation: 05/28/2024 Time spent:60 minutes  Chief Complaint:  Chief Complaint   Depression; Other    HISTORY/CURRENT STATUS: HPI for Spravato  treatment, she sees Robin Sayers, NP who is out of the office today.  [name] is doing well. Will be going to [place] this weekend for 10 days and is excited to see her daughter who is doing a study abroad program this semester. Feels like the meds are still effective.  Energy and motivation are good.  No extreme sadness, tearfulness, or feelings of hopelessness.  Sleeps well most of the time. ADLs and personal hygiene are normal.   Denies any changes in concentration, making decisions, or remembering things.  Appetite has not changed.  Weight is stable.  Anxiety is controlled and Xanax  is effective when needed.  No mania, delirium, AH/VH.  No SI/HI.'  Individual Medical History/ Review of Systems: Changes? :No   Past medications for mental health diagnoses include: Spravato , Xanax , Wellbutrin , Vraylar , gabapentin , Fetzima , trazodone   Allergies: Cat dander and Brewers yeast  Current Medications:  Current Outpatient Medications:    ALPRAZolam  (XANAX ) 0.5 MG tablet, Take 1 tablet at bedtime the night before procedure. Take 1 tablet 1 hour before arrival. Bring third tablet with you to procedure., Disp: 3 tablet, Rfl: 0   atorvastatin  (LIPITOR) 20 MG tablet, Take 1 tablet (20 mg total) by mouth daily., Disp: 90 tablet, Rfl: 3   atovaquone -proguanil (MALARONE ) 250-100 MG TABS tablet, Take 1 tablet by mouth once daily begin 2 days prearrival,take daily during stay & continue for 7 days posttravel for malaria prevention, Disp: 24 tablet, Rfl: 0   azithromycin  (ZITHROMAX ) 500 MG tablet, For non bloody diarrhea,take 2 tabs on day 1, if resolved,stop med,If diarrhea persists 1 tab on day 2 & 3. For bloody diarrhea,2 tabs on day 1 & 1 tab  on day 2 & 3, Disp: 4 tablet, Rfl: 0   buPROPion  (WELLBUTRIN  XL) 150 MG 24 hr tablet, Take 1 tablet (150 mg total) by mouth every morning., Disp: 30 tablet, Rfl: 2   cariprazine  (VRAYLAR ) 1.5 MG capsule, Take 1 capsule (1.5 mg total) by mouth daily., Disp: 28 capsule, Rfl: 0   celecoxib  (CELEBREX ) 200 MG capsule, Take 1 capsule by mouth single dose as directed. Take 1 hour prior to arrival for surgery, Disp: 1 capsule, Rfl: 0   Esketamine HCl, 84 MG Dose, (SPRAVATO , 84 MG DOSE,) 28 MG/DEVICE SOPK, Dispense X 3 (28 mg) devices for treatments every 7 days, administer intranasally, Disp: 3 each, Rfl: 3   esomeprazole (NEXIUM) 20 MG capsule, Take 20 mg by mouth once., Disp: , Rfl:    fluocinonide  ointment (LIDEX ) 0.05 %, Apply as directed to skin twice a day, Disp: 30 g, Rfl: 0   gabapentin  (NEURONTIN ) 300 MG capsule, Take 1 capsule (300 mg total) by mouth 3 (three) times daily. Take first dose 1 hour before arrival for surgery., Disp: 21 capsule, Rfl: 0   hydrocortisone  2.5 % cream, Apply a small amount to affected area twice daily for 1-2 weeks as directed., Disp: 20 g, Rfl: 0   ketoconazole  (NIZORAL ) 2 % cream, Apply a small amount to affected area on left foot/toe once a day, Disp: 30 g, Rfl: 0   Levomilnacipran  HCl ER (FETZIMA ) 120 MG CP24, Take 1 capsule by mouth daily., Disp: 90 capsule, Rfl: 3   meclizine  (ANTIVERT ) 25 MG tablet, Take 1 tablet (  25 mg total) by mouth as directed prior to treatment., Disp: 60 tablet, Rfl: 0   metFORMIN  (GLUCOPHAGE ) 500 MG tablet, Take 1 tablet (500 mg total) by mouth daily., Disp: 90 tablet, Rfl: 1   metFORMIN  (GLUCOPHAGE ) 500 MG tablet, Take 1 tablet (500 mg total) by mouth 2 (two) times daily., Disp: 180 tablet, Rfl: 1   metFORMIN  (GLUCOPHAGE ) 500 MG tablet, Take 1 tablet (500 mg total) by mouth 2 (two) times daily., Disp: 90 tablet, Rfl: 1   metFORMIN  (GLUCOPHAGE -XR) 500 MG 24 hr tablet, Take 1 tablet (500 mg total) by mouth daily. Please do not fill if not  picked up within 30 days., Disp: 30 tablet, Rfl: 0   methocarbamol  (ROBAXIN ) 500 MG tablet, Take 1 tablet (500 mg total) by mouth every 6 (six) hours as needed for muscle spasms. (Patient not taking: Reported on 10/29/2023), Disp: 45 tablet, Rfl: 0   Norethindrone-Ethinyl Estradiol -Fe Biphas (LO LOESTRIN FE ) 1 MG-10 MCG / 10 MCG tablet, Take 1 tablet by mouth daily., Disp: 28 tablet, Rfl: 4   ondansetron  (ZOFRAN ) 4 MG tablet, Take 1 tablet  by mouth every 6 hours as needed for nausea, Disp: 15 tablet, Rfl: 0   ondansetron  (ZOFRAN ) 4 MG tablet, Take 1 tablet (4 mg total) by mouth every 6 (six) hours for nausea. TAKE 4 tablets 15 minutes prior to arrival for surgery, Disp: 30 tablet, Rfl: 0   ondansetron  (ZOFRAN -ODT) 4 MG disintegrating tablet, Dissolve 1 tablet (4 mg total) by mouth every 8 (eight) hours as needed for nausea or vomiting., Disp: 20 tablet, Rfl: 1   oxyCODONE  (OXY IR/ROXICODONE ) 5 MG immediate release tablet, Take 1-2 tablet by mouth every six to eight hours as needed for Post Op Pain. DO NOT EXCEED  6 tablets in 24 hours., Disp: 30 tablet, Rfl: 0   polyethylene glycol (MIRALAX / GLYCOLAX) packet, Take 17 g by mouth daily., Disp: , Rfl:    polyethylene glycol-electrolytes (NULYTELY) 420 g solution, Take 4,000 mLs by mouth Nightly., Disp: 4000 mL, Rfl: 0   Semaglutide -Weight Management (WEGOVY ) 2.4 MG/0.75ML SOAJ, Inject 2.4 mg into the skin once a week for weight loss., Disp: 3 mL, Rfl: 0   sulfamethoxazole -trimethoprim  (BACTRIM  DS) 800-160 MG tablet, Take 1 tablet by mouth 2 (two) times daily as directed. Start the day after surgery., Disp: 10 tablet, Rfl: 0   SUMAtriptan  (IMITREX ) 50 MG tablet, TAKE 1 TABLET BY MOUTH AT ONSET OF HEADACHE, MAY REPEAT 1 DOSE AFTER 2 HOURS IF NEEDED, Disp: 10 tablet, Rfl: 5   SUMAtriptan  (IMITREX ) 50 MG tablet, Take 1 tablet by mouth at onset of headache, may repeat same dose once 2 hours later if needed, Disp: 10 tablet, Rfl: 5   SUMAtriptan  (IMITREX ) 50 MG  tablet, Take 1 tablet (50 mg total) by mouth at the onset of headache. May repeat 1 dose after 2 hours if needed., Disp: 10 tablet, Rfl: 5   SUMAtriptan  (IMITREX ) 50 MG tablet, Take 1 tablet (50 mg total) by mouth at onset of headache,may repeat 1 dose after 2 hours if needed, 2 to 3 times a week, Disp: 9 tablet, Rfl: 6   SUMAtriptan  (IMITREX ) 50 MG tablet, Take 1 tablet (50 mg total) by mouth as needed at onset of headache. May repeat 1 dose after 2 hours if needed. Only take 2-3 times a week., Disp: 9 tablet, Rfl: 6   terbinafine  (LAMISIL ) 250 MG tablet, Take 1 tablet (250 mg total) by mouth daily for Tinea, Disp: 14 tablet, Rfl:  0   traZODone  (DESYREL ) 50 MG tablet, Take 50 mg by mouth at bedtime., Disp: , Rfl:    traZODone  (DESYREL ) 50 MG tablet, TAKE 1 TO 2 TABLETS BY MOUTH AT BEDTIME, Disp: 180 tablet, Rfl: 1   traZODone  (DESYREL ) 50 MG tablet, Take 1-2 tablets (50-100 mg total) by mouth at bedtime., Disp: 180 tablet, Rfl: 1   traZODone  (DESYREL ) 50 MG tablet, Take 1-2 tablets (50-100 mg total) by mouth at bedtime., Disp: 180 tablet, Rfl: 1   WEGOVY  0.5 MG/0.5ML SOAJ, Inject 0.5 mg into the skin once a week for weight loss., Disp: 2 mL, Rfl: 0   WEGOVY  1 MG/0.5ML SOAJ, Inject 1 mg into the skin once a week for weight loss, Disp: 2 mL, Rfl: 0   WEGOVY  2.4 MG/0.75ML SOAJ, Inject 2.4 mg into the skin once a week for weight loss., Disp: 3 mL, Rfl: 0   WEGOVY  2.4 MG/0.75ML SOAJ, Inject 2.4 mg into the skin once a week fro weight loss, Disp: 3 mL, Rfl: 0   WEGOVY  2.4 MG/0.75ML SOAJ, Inject 2.4 mg into the skin once a week for weight loss, Disp: 3 mL, Rfl: 0   WEGOVY  2.4 MG/0.75ML SOAJ, Inject 2.4 mg,sub-q, into the skin once a week for weight loss, Disp: 3 mL, Rfl: 0   WEGOVY  2.4 MG/0.75ML SOAJ, Inject 2.4 mg into the skin once a week for weight loss., Disp: 3 mL, Rfl: 0   WEGOVY  2.4 MG/0.75ML SOAJ, Inject 2.4 mg into the skin once a week for weight loss., Disp: 3 mL, Rfl: 0   WEGOVY  2.4 MG/0.75ML  SOAJ, Inject 2.4 mg into the skin once a week for weight loss, Disp: 3 mL, Rfl: 0 Medication Side Effects: none  Family Medical/ Social History: Changes?   No  MENTAL HEALTH EXAM:  There were no vitals taken for this visit.There is no height or weight on file to calculate BMI.  General Appearance: Casual and Well Groomed  Eye Contact:  Good  Speech:  Clear and Coherent and Normal Rate  Volume:  Normal  Mood:  Euthymic  Affect:  Congruent  Thought Process:  Goal Directed and Descriptions of Associations: Circumstantial  Orientation:  Full (Time, Place, and Person)  Thought Content: Logical   Suicidal Thoughts:  No  Homicidal Thoughts:  No  Memory:  WNL  Judgement:  Good  Insight:  Good  Psychomotor Activity:  Normal  Concentration:  Concentration: Good and Attention Span: Good  Recall:  Good  Fund of Knowledge: Good  Language: Good  Assets:  Communication Skills Desire for Improvement Financial Resources/Insurance Housing Leisure Time Resilience Social Support Transportation Vocational/Educational  ADL's:  Intact  Cognition: WNL  Prognosis:  Good   ECT-MADRS    Flowsheet Row Video Visit from 03/20/2024 in [facility] Clinical Support from 01/23/2024 in [facility]  MADRS Total Score 20 18   Patient was administered Spravato  84 mg intranasally today.  The patient experienced the typical dissociation which gradually resolved over the 2-hour period of observation.  There were no complications.  Specifically the patient did not have nausea or vomiting or headache.  Blood pressures remained within normal ranges at the 40-minute and 2-hour follow-up intervals.  Pulse ox were nl. By the time the 2-hour observation period was met the patient was alert and oriented and able to exit without assistance.  Patient feels the Spravato  administration is helpful for the treatment resistant depression and would like to continue the  treatment.  See nursing note for further details.  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 Generalized anxiety disorder  F41.1       Receiving Psychotherapy: No   RECOMMENDATIONS:   PDMP reviewed.  Spravato  filled 12/31/2023. I provided approximately  60 minutes of face to face time during this encounter, including time spent before and after the visit in records review, medical decision making, counseling pertinent to today's visit, and charting.   Doing well on current meds so no changes are needed.   Continue Xanax  0.5 mg, daily as needed. Continue Wellbutrin  XL 150 mg daily. Continue Vraylar  1.5 mg, 1 p.o. daily. Continue Spravato  weekly. Continue Fetzima  120 mg, 1 p.o. daily. Continue trazodone  50 mg, 1-2 nightly as needed sleep. Return in 3 weeks, after returning from [place].  Verneita Cooks, PA-C

## 2024-05-28 NOTE — Progress Notes (Signed)
 NURSES NOTE:   Pt arrived for her # 69 Spravato  Treatment for treatment resistant depression, the starting dose was 56 mg (2 of the 28 mg nasal sprays) pt stayed at that dose for the first 4 treatments and also takes Zofran  and Meclizine  prior to arrival at office. Pt received 84 mg (3 of the 28 mg nasal sprays) total again today and did also premedicate. Pt is now using her medical benefits and receives Spravato  through [facility], today is the first time using it. Pt sees Angeline Sayers, NP and she will follow her throughout her treatments and follow ups. Spravato  medication is stored at treatment center per REMS/FDA guidelines. The medication is required to be locked behind two doors per REMS/FDA protocol. Medication is also disposed of properly after each use per regulations. All documentation for REMS is completed and submitted per FDA/REMS requirements.          Pt directed to treatment room, started vitals at 10:08 AM, 134/84, pulse 72, SpO2 98%. Pt did take Zofran  4 mg and Meclizine  25 mg prior to arriving at office, an hour ahead. Instructed patient to blow her nose if needed then recline back to a 45 degree angle if that was more comfortable for her. Gave patient first dose 28 mg nasal spray, administered in each nostril as directed and observed by nurse, waited 5 more minutes for the second and third doses. After all doses given pt did not complain of nausea. pt keeps water and some mints to help with the taste of Spravato  it gives the metallic taste. Her 40 minute check was at 10:44 AM, vital signs were 150/90, pulse 72, SpO2 99%. Pt aware she would be monitored for a total time of 120 minutes.  Discharge vitals were taken at 12:00 PM 154/95 P 74, SpO2 99%. Pt met with Verneita Cooks, PA-C to discuss her treatment. Pt advised to relax the rest of the day. No driving, no intense activities. Verbalized understanding. Pt reports no issues today. Nurse was with pt a total of 60 minutes for clinical  assessment.  Pt instructed to call office with any problems or questions prior to next treatment. She is not scheduled again until 10/29 they will be visiting their daughter for 2 weeks.       LOT [id number] EXP APR 2028

## 2024-05-29 ENCOUNTER — Encounter: Payer: Self-pay | Admitting: Physician Assistant

## 2024-05-29 ENCOUNTER — Other Ambulatory Visit (HOSPITAL_COMMUNITY): Payer: Self-pay

## 2024-06-06 ENCOUNTER — Encounter: Payer: Self-pay | Admitting: Adult Health

## 2024-06-06 NOTE — Progress Notes (Signed)
 Robin Stevenson [id number] 09/05/1975 48 y.o.  Subjective:   Patient ID:  Robin Stevenson is a 48 y.o. (DOB 1976/05/02) female.  Chief Complaint: No chief complaint on file.   HPI Robin Stevenson presents to the office today for follow-up of  treatment resistant depression (TRD).  Spravato  treatment    Patient was administered Spravato  84 mg intranasally today. Patient was observed by provider throughout Spravato  treatment. The patient experienced the typical dissociation which gradually resolved over the 2-hour period of observation. She did not feel sleepy, decreased feeling of sensitivity (numbness), lack of energy, increased blood pressure, feeling happy or very excited, or headache. She denied feeling disconnected from themself, their thoughts, feelings and things around them. Blood pressures remained within normal ranges at the 40-minute and 2-hour follow-up intervals. By the time the 2-hour observation period was met the patient was alert and oriented and able to exit without assistance. 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 See nursing note for further details.  [name] reports a positive experience today. Reports mood today as variable. She does report a possible hormonal component contributing to mood. She denies depression. She reports stable interest and motivation. She reports completing ADLs consistently. She reports stable focus and concentration. Reports completing tasks. Appetite is adequate. Weight is stable. She reports sleeping well most nights. Denies suicidal or homicidal thoughts.  ECT-MADRS    Flowsheet Row Clinical Support from 01/23/2024 in [facility]  MADRS Total Score 18     Review of Systems:  Review of Systems  Musculoskeletal:  Negative for gait problem.  Neurological:  Negative for tremors.  Psychiatric/Behavioral:         Please refer to HPI    Medications: I  have reviewed the patient's current medications.  Current Outpatient Medications  Medication Sig Dispense Refill   ALPRAZolam  (XANAX ) 0.5 MG tablet Take 1 tablet at bedtime the night before procedure. Take 1 tablet 1 hour before arrival. Bring third tablet with you to procedure. 3 tablet 0   atorvastatin  (LIPITOR) 20 MG tablet Take 1 tablet (20 mg total) by mouth daily. 90 tablet 3   atovaquone -proguanil (MALARONE ) 250-100 MG TABS tablet Take 1 tablet by mouth once daily begin 2 days prearrival,take daily during stay & continue for 7 days posttravel for malaria prevention 24 tablet 0   azithromycin  (ZITHROMAX ) 500 MG tablet For non bloody diarrhea,take 2 tabs on day 1, if resolved,stop med,If diarrhea persists 1 tab on day 2 & 3. For bloody diarrhea,2 tabs on day 1 & 1 tab on day 2 & 3 4 tablet 0   buPROPion  (WELLBUTRIN  XL) 150 MG 24 hr tablet Take 1 tablet (150 mg total) by mouth every morning. 30 tablet 2   cariprazine  (VRAYLAR ) 1.5 MG capsule Take 1 capsule (1.5 mg total) by mouth daily. 28 capsule 0   celecoxib  (CELEBREX ) 200 MG capsule Take 1 capsule by mouth single dose as directed. Take 1 hour prior to arrival for surgery 1 capsule 0   Esketamine HCl, 84 MG Dose, (SPRAVATO , 84 MG DOSE,) 28 MG/DEVICE SOPK Dispense X 3 (28 mg) devices for treatments every 7 days, administer intranasally 3 each 3   esomeprazole (NEXIUM) 20 MG capsule Take 20 mg by mouth once.     fluocinonide  ointment (LIDEX ) 0.05 % Apply as directed to skin twice a day 30 g 0   gabapentin  (NEURONTIN ) 300 MG capsule Take 1 capsule (300 mg total) by mouth 3 (three)  times daily. Take first dose 1 hour before arrival for surgery. 21 capsule 0   hydrocortisone  2.5 % cream Apply a small amount to affected area twice daily for 1-2 weeks as directed. 20 g 0   ketoconazole  (NIZORAL ) 2 % cream Apply a small amount to affected area on left foot/toe once a day 30 g 0   Levomilnacipran  HCl ER (FETZIMA ) 120 MG CP24 Take 1 capsule by mouth  daily. 90 capsule 3   meclizine  (ANTIVERT ) 25 MG tablet Take 1 tablet (25 mg total) by mouth as directed prior to treatment. 60 tablet 0   metFORMIN  (GLUCOPHAGE ) 500 MG tablet Take 1 tablet (500 mg total) by mouth daily. 90 tablet 1   metFORMIN  (GLUCOPHAGE ) 500 MG tablet Take 1 tablet (500 mg total) by mouth 2 (two) times daily. 180 tablet 1   metFORMIN  (GLUCOPHAGE ) 500 MG tablet Take 1 tablet (500 mg total) by mouth 2 (two) times daily. 90 tablet 1   metFORMIN  (GLUCOPHAGE -XR) 500 MG 24 hr tablet Take 1 tablet (500 mg total) by mouth daily. Please do not fill if not picked up within 30 days. 30 tablet 0   methocarbamol  (ROBAXIN ) 500 MG tablet Take 1 tablet (500 mg total) by mouth every 6 (six) hours as needed for muscle spasms. (Patient not taking: Reported on 10/29/2023) 45 tablet 0   Norethindrone-Ethinyl Estradiol -Fe Biphas (LO LOESTRIN FE ) 1 MG-10 MCG / 10 MCG tablet Take 1 tablet by mouth daily. 28 tablet 4   ondansetron  (ZOFRAN ) 4 MG tablet Take 1 tablet  by mouth every 6 hours as needed for nausea 15 tablet 0   ondansetron  (ZOFRAN ) 4 MG tablet Take 1 tablet (4 mg total) by mouth every 6 (six) hours for nausea. TAKE 4 tablets 15 minutes prior to arrival for surgery 30 tablet 0   ondansetron  (ZOFRAN -ODT) 4 MG disintegrating tablet Dissolve 1 tablet (4 mg total) by mouth every 8 (eight) hours as needed for nausea or vomiting. 20 tablet 1   oxyCODONE  (OXY IR/ROXICODONE ) 5 MG immediate release tablet Take 1-2 tablet by mouth every six to eight hours as needed for Post Op Pain. DO NOT EXCEED  6 tablets in 24 hours. 30 tablet 0   polyethylene glycol (MIRALAX / GLYCOLAX) packet Take 17 g by mouth daily.     polyethylene glycol-electrolytes (NULYTELY) 420 g solution Take 4,000 mLs by mouth Nightly. 4000 mL 0   Semaglutide -Weight Management (WEGOVY ) 2.4 MG/0.75ML SOAJ Inject 2.4 mg into the skin once a week for weight loss. 3 mL 0   sulfamethoxazole -trimethoprim  (BACTRIM  DS) 800-160 MG tablet Take 1  tablet by mouth 2 (two) times daily as directed. Start the day after surgery. 10 tablet 0   SUMAtriptan  (IMITREX ) 50 MG tablet TAKE 1 TABLET BY MOUTH AT ONSET OF HEADACHE, MAY REPEAT 1 DOSE AFTER 2 HOURS IF NEEDED 10 tablet 5   SUMAtriptan  (IMITREX ) 50 MG tablet Take 1 tablet by mouth at onset of headache, may repeat same dose once 2 hours later if needed 10 tablet 5   SUMAtriptan  (IMITREX ) 50 MG tablet Take 1 tablet (50 mg total) by mouth at the onset of headache. May repeat 1 dose after 2 hours if needed. 10 tablet 5   SUMAtriptan  (IMITREX ) 50 MG tablet Take 1 tablet (50 mg total) by mouth at onset of headache,may repeat 1 dose after 2 hours if needed, 2 to 3 times a week 9 tablet 6   SUMAtriptan  (IMITREX ) 50 MG tablet Take 1 tablet (50 mg total)  by mouth as needed at onset of headache. May repeat 1 dose after 2 hours if needed. Only take 2-3 times a week. 9 tablet 6   terbinafine  (LAMISIL ) 250 MG tablet Take 1 tablet (250 mg total) by mouth daily for Tinea 14 tablet 0   traZODone  (DESYREL ) 50 MG tablet Take 50 mg by mouth at bedtime.     traZODone  (DESYREL ) 50 MG tablet TAKE 1 TO 2 TABLETS BY MOUTH AT BEDTIME 180 tablet 1   traZODone  (DESYREL ) 50 MG tablet Take 1-2 tablets (50-100 mg total) by mouth at bedtime. 180 tablet 1   traZODone  (DESYREL ) 50 MG tablet Take 1-2 tablets (50-100 mg total) by mouth at bedtime. 180 tablet 1   WEGOVY  0.5 MG/0.5ML SOAJ Inject 0.5 mg into the skin once a week for weight loss. 2 mL 0   WEGOVY  1 MG/0.5ML SOAJ Inject 1 mg into the skin once a week for weight loss 2 mL 0   WEGOVY  2.4 MG/0.75ML SOAJ Inject 2.4 mg into the skin once a week for weight loss. 3 mL 0   WEGOVY  2.4 MG/0.75ML SOAJ Inject 2.4 mg into the skin once a week fro weight loss 3 mL 0   WEGOVY  2.4 MG/0.75ML SOAJ Inject 2.4 mg into the skin once a week for weight loss 3 mL 0   WEGOVY  2.4 MG/0.75ML SOAJ Inject 2.4 mg,sub-q, into the skin once a week for weight loss 3 mL 0   WEGOVY  2.4 MG/0.75ML SOAJ  Inject 2.4 mg into the skin once a week for weight loss. 3 mL 0   WEGOVY  2.4 MG/0.75ML SOAJ Inject 2.4 mg into the skin once a week for weight loss. 3 mL 0   WEGOVY  2.4 MG/0.75ML SOAJ Inject 2.4 mg into the skin once a week for weight loss 3 mL 0   No current facility-administered medications for this visit.    Medication Side Effects: None  Allergies:  Allergies  Allergen Reactions   Cat Dander Anaphylaxis   Brewers Yeast Rash    Past Medical History:  Diagnosis Date   Depression    takes Welbutrin and Fetzima    GERD (gastroesophageal reflux disease)    takes nexium    Past Medical History, Surgical history, Social history, and Family history were reviewed and updated as appropriate.   Please see review of systems for further details on the patient's review from today.   Objective:   Physical Exam:  There were no vitals taken for this visit.  Physical Exam Constitutional:      General: She is not in acute distress. Musculoskeletal:        General: No deformity.  Neurological:     Mental Status: She is alert and oriented to person, place, and time.     Coordination: Coordination normal.  Psychiatric:        Attention and Perception: Attention and perception normal. She does not perceive auditory or visual hallucinations.        Mood and Affect: Mood normal. Mood is not anxious or depressed. Affect is not labile, blunt, angry or inappropriate.        Speech: Speech normal.        Behavior: Behavior normal.        Thought Content: Thought content normal. Thought content is not paranoid or delusional. Thought content does not include homicidal or suicidal ideation. Thought content does not include homicidal or suicidal plan.        Cognition and Memory: Cognition and memory normal.  Judgment: Judgment normal.     Comments: Insight intact     Lab Review:     Component Value Date/Time   NA 140 09/01/2016 0852   K 4.4 09/01/2016 0852   CL 104 09/01/2016 0852    CO2 31 09/01/2016 0852   GLUCOSE 90 09/01/2016 0852   BUN 14 09/01/2016 0852   CREATININE 0.86 09/01/2016 0852   CALCIUM  9.2 09/01/2016 0852   PROT 7.0 09/01/2016 0852   ALBUMIN 4.3 09/01/2016 0852   AST 33 09/01/2016 0852   ALT 70 (H) 09/01/2016 0852   ALKPHOS 66 09/01/2016 0852   BILITOT 0.7 09/01/2016 0852       Component Value Date/Time   WBC 5.1 09/01/2016 0852   RBC 4.30 09/01/2016 0852   HGB 13.5 09/01/2016 0852   HCT 38.9 09/01/2016 0852   PLT 286.0 09/01/2016 0852   MCV 90.4 09/01/2016 0852   MCHC 34.6 09/01/2016 0852   RDW 11.9 09/01/2016 0852   LYMPHSABS 1.5 09/01/2016 0852   MONOABS 0.4 09/01/2016 0852   EOSABS 0.1 09/01/2016 0852   BASOSABS 0.0 09/01/2016 0852    No results found for: POCLITH, LITHIUM   No results found for: PHENYTOIN, PHENOBARB, VALPROATE, CBMZ   .res Assessment: Plan:    Plan:  PDMP reviewed.  Spravato  filled 02/05/2024.  I provided 40 minutes of face to face time during this encounter, including time spent before and after the visit in records review, medical decision making, counseling pertinent to today's visit, and charting.   Continue Xanax  0.5 mg, daily as needed. Continue Wellbutrin  XL 150 mg daily. Continue Vraylar  1.5 mg, 1 p.o. daily. Continue Spravato  weekly. Continue Fetzima  120 mg, 1 p.o. daily. Continue trazodone  50 mg, 1-2 nightly as needed sleep.  Return in 1 week.  There are no diagnoses linked to this encounter.   Please see After Visit Summary for patient specific instructions.  No future appointments.  No orders of the defined types were placed in this encounter.   -------------------------------

## 2024-06-18 ENCOUNTER — Ambulatory Visit (INDEPENDENT_AMBULATORY_CARE_PROVIDER_SITE_OTHER): Admitting: Physician Assistant

## 2024-06-18 ENCOUNTER — Ambulatory Visit

## 2024-06-18 ENCOUNTER — Other Ambulatory Visit (HOSPITAL_COMMUNITY): Payer: Self-pay

## 2024-06-18 ENCOUNTER — Encounter: Payer: Self-pay | Admitting: Physician Assistant

## 2024-06-18 VITALS — BP 131/89 | HR 80

## 2024-06-18 DIAGNOSIS — F339 Major depressive disorder, recurrent, unspecified: Secondary | ICD-10-CM

## 2024-06-18 DIAGNOSIS — F411 Generalized anxiety disorder: Secondary | ICD-10-CM

## 2024-06-18 MED ORDER — LORAZEPAM 0.5 MG PO TABS
0.2500 mg | ORAL_TABLET | Freq: Three times a day (TID) | ORAL | 0 refills | Status: AC | PRN
Start: 2024-06-18 — End: ?
  Filled 2024-06-18: qty 30, 10d supply, fill #0

## 2024-06-18 NOTE — Progress Notes (Signed)
 NURSES NOTE:   Pt arrived for her # 55 Spravato  Treatment for treatment resistant depression, the starting dose was 56 mg (2 of the 28 mg nasal sprays) pt stayed at that dose for the first 4 treatments and also takes Zofran  and Meclizine  prior to arrival at office. Pt received 84 mg (3 of the 28 mg nasal sprays) total again today and did also premedicate. Pt is now using her medical benefits and receives Spravato  through [facility], today is the first time using it. Pt sees Angeline Sayers, NP and she will follow her throughout her treatments and follow ups. Spravato  medication is stored at treatment center per REMS/FDA guidelines. The medication is required to be locked behind two doors per REMS/FDA protocol. Medication is also disposed of properly after each use per regulations. All documentation for REMS is completed and submitted per FDA/REMS requirements.          Pt directed to treatment room, started vitals at 10:00 AM, 130/90, pulse 77, SpO2 98%. Pt did take Zofran  4 mg and Meclizine  25 mg prior to arriving at office, an hour ahead. Instructed patient to blow her nose if needed then recline back to a 45 degree angle if that was more comfortable for her. Gave patient first dose 28 mg nasal spray, administered in each nostril as directed and observed by nurse, waited 5 more minutes for the second and third doses. After all doses given pt did not complain of nausea. pt keeps water and some mints to help with the taste of Spravato  it gives the metallic taste. Her 40 minute check was at 10:45 AM, vital signs were 138/89, pulse 75, SpO2 97%. Pt aware she would be monitored for a total time of 120 minutes.  Discharge vitals were taken at 11:55 AM 131/89 P 80, SpO2 99%. Pt met with Verneita Cooks, PA-C to discuss her treatment. Pt advised to relax the rest of the day. No driving, no intense activities. Verbalized understanding. Pt reports no issues today. Nurse was with pt a total of 60 minutes for clinical  assessment.  Pt instructed to call office with any problems or questions prior to next treatment. She is scheduled next Thursday.      (84 mg) LOT [id number] EXP JUN 2028

## 2024-06-18 NOTE — Progress Notes (Signed)
 [facility]  Patient ID: Robin Stevenson,  MRN: [id number]  PCP: Signa Rush, MD (Inactive)  Date of Evaluation: 06/18/2024 Time spent:60 minutes  Chief Complaint:  Chief Complaint   Depression; Other    HISTORY/CURRENT STATUS: HPI for Spravato  treatment, she sees Angeline Sayers, NP who is out of the office today.  [name] is more anxious lately, feels on edge.  Has a hard time relaxing.  Not having PA, just overwhelmed. Not depressed. Feels like Spravato  and current meds are working well in that regard. She and her husband enjoyed their visit to [place] several week ago.   Energy and motivation are good.  No extreme sadness, tearfulness, or feelings of hopelessness.  Sleeps is broken up sometimes, b/c they have an elderly dog that needs help.  She's able to go to sleep with the Trazodone  but if she gets woken up, it's hard for her to go back to sleep. . ADLs and personal hygiene are normal.   Denies any changes in concentration, making decisions, or remembering things.  Appetite has not changed.  Weight is stable.  No mania, delirium, AH/VH.  No SI/HI.  Individual Medical History/ Review of Systems: Changes? :No   Past medications for mental health diagnoses include: Spravato , Xanax , Wellbutrin , Vraylar , gabapentin , Fetzima , trazodone   Allergies: Cat dander and Brewers yeast  Current Medications:  Current Outpatient Medications:    ALPRAZolam  (XANAX ) 0.5 MG tablet, Take 1 tablet at bedtime the night before procedure. Take 1 tablet 1 hour before arrival. Bring third tablet with you to procedure., Disp: 3 tablet, Rfl: 0   atorvastatin  (LIPITOR) 20 MG tablet, Take 1 tablet (20 mg total) by mouth daily., Disp: 90 tablet, Rfl: 3   buPROPion  (WELLBUTRIN  XL) 150 MG 24 hr tablet, Take 1 tablet (150 mg total) by mouth every morning., Disp: 30 tablet, Rfl: 2   cariprazine  (VRAYLAR ) 1.5 MG capsule, Take 1 capsule (1.5 mg total) by mouth daily., Disp: 28 capsule, Rfl: 0    celecoxib  (CELEBREX ) 200 MG capsule, Take 1 capsule by mouth single dose as directed. Take 1 hour prior to arrival for surgery, Disp: 1 capsule, Rfl: 0   Esketamine HCl, 84 MG Dose, (SPRAVATO , 84 MG DOSE,) 28 MG/DEVICE SOPK, Dispense X 3 (28 mg) devices for treatments every 7 days, administer intranasally, Disp: 3 each, Rfl: 3   esomeprazole (NEXIUM) 20 MG capsule, Take 20 mg by mouth once., Disp: , Rfl:    gabapentin  (NEURONTIN ) 300 MG capsule, Take 1 capsule (300 mg total) by mouth 3 (three) times daily. Take first dose 1 hour before arrival for surgery., Disp: 21 capsule, Rfl: 0   Levomilnacipran  HCl ER (FETZIMA ) 120 MG CP24, Take 1 capsule by mouth daily., Disp: 90 capsule, Rfl: 3   LORazepam (ATIVAN) 0.5 MG tablet, Take 0.5-1 tablets (0.25-0.5 mg total) by mouth every 8 (eight) hours as needed for anxiety., Disp: 30 tablet, Rfl: 0   meclizine  (ANTIVERT ) 25 MG tablet, Take 1 tablet (25 mg total) by mouth as directed prior to treatment., Disp: 60 tablet, Rfl: 0   metFORMIN  (GLUCOPHAGE -XR) 500 MG 24 hr tablet, Take 1 tablet (500 mg total) by mouth daily. Please do not fill if not picked up within 30 days., Disp: 30 tablet, Rfl: 0   ondansetron  (ZOFRAN ) 4 MG tablet, Take 1 tablet  by mouth every 6 hours as needed for nausea, Disp: 15 tablet, Rfl: 0   SUMAtriptan  (IMITREX ) 50 MG tablet, Take 1 tablet (50 mg total) by mouth as needed at  onset of headache. May repeat 1 dose after 2 hours if needed. Only take 2-3 times a week., Disp: 9 tablet, Rfl: 6   traZODone  (DESYREL ) 50 MG tablet, Take 1-2 tablets (50-100 mg total) by mouth at bedtime., Disp: 180 tablet, Rfl: 1   WEGOVY  2.4 MG/0.75ML SOAJ, Inject 2.4 mg into the skin once a week for weight loss., Disp: 3 mL, Rfl: 0   atovaquone -proguanil (MALARONE ) 250-100 MG TABS tablet, Take 1 tablet by mouth once daily begin 2 days prearrival,take daily during stay & continue for 7 days posttravel for malaria prevention, Disp: 24 tablet, Rfl: 0   azithromycin   (ZITHROMAX ) 500 MG tablet, For non bloody diarrhea,take 2 tabs on day 1, if resolved,stop med,If diarrhea persists 1 tab on day 2 & 3. For bloody diarrhea,2 tabs on day 1 & 1 tab on day 2 & 3, Disp: 4 tablet, Rfl: 0   fluocinonide  ointment (LIDEX ) 0.05 %, Apply as directed to skin twice a day, Disp: 30 g, Rfl: 0   hydrocortisone  2.5 % cream, Apply a small amount to affected area twice daily for 1-2 weeks as directed., Disp: 20 g, Rfl: 0   ketoconazole  (NIZORAL ) 2 % cream, Apply a small amount to affected area on left foot/toe once a day, Disp: 30 g, Rfl: 0   metFORMIN  (GLUCOPHAGE ) 500 MG tablet, Take 1 tablet (500 mg total) by mouth daily., Disp: 90 tablet, Rfl: 1   metFORMIN  (GLUCOPHAGE ) 500 MG tablet, Take 1 tablet (500 mg total) by mouth 2 (two) times daily., Disp: 180 tablet, Rfl: 1   metFORMIN  (GLUCOPHAGE ) 500 MG tablet, Take 1 tablet (500 mg total) by mouth 2 (two) times daily., Disp: 90 tablet, Rfl: 1   methocarbamol  (ROBAXIN ) 500 MG tablet, Take 1 tablet (500 mg total) by mouth every 6 (six) hours as needed for muscle spasms. (Patient not taking: Reported on 10/29/2023), Disp: 45 tablet, Rfl: 0   Norethindrone-Ethinyl Estradiol -Fe Biphas (LO LOESTRIN FE ) 1 MG-10 MCG / 10 MCG tablet, Take 1 tablet by mouth daily., Disp: 28 tablet, Rfl: 4   ondansetron  (ZOFRAN ) 4 MG tablet, Take 1 tablet (4 mg total) by mouth every 6 (six) hours for nausea. TAKE 4 tablets 15 minutes prior to arrival for surgery, Disp: 30 tablet, Rfl: 0   ondansetron  (ZOFRAN -ODT) 4 MG disintegrating tablet, Dissolve 1 tablet (4 mg total) by mouth every 8 (eight) hours as needed for nausea or vomiting., Disp: 20 tablet, Rfl: 1   oxyCODONE  (OXY IR/ROXICODONE ) 5 MG immediate release tablet, Take 1-2 tablet by mouth every six to eight hours as needed for Post Op Pain. DO NOT EXCEED  6 tablets in 24 hours., Disp: 30 tablet, Rfl: 0   polyethylene glycol (MIRALAX / GLYCOLAX) packet, Take 17 g by mouth daily., Disp: , Rfl:    polyethylene  glycol-electrolytes (NULYTELY) 420 g solution, Take 4,000 mLs by mouth Nightly., Disp: 4000 mL, Rfl: 0   Semaglutide -Weight Management (WEGOVY ) 2.4 MG/0.75ML SOAJ, Inject 2.4 mg into the skin once a week for weight loss., Disp: 3 mL, Rfl: 0   sulfamethoxazole -trimethoprim  (BACTRIM  DS) 800-160 MG tablet, Take 1 tablet by mouth 2 (two) times daily as directed. Start the day after surgery., Disp: 10 tablet, Rfl: 0   SUMAtriptan  (IMITREX ) 50 MG tablet, TAKE 1 TABLET BY MOUTH AT ONSET OF HEADACHE, MAY REPEAT 1 DOSE AFTER 2 HOURS IF NEEDED, Disp: 10 tablet, Rfl: 5   SUMAtriptan  (IMITREX ) 50 MG tablet, Take 1 tablet by mouth at onset of headache, may repeat  same dose once 2 hours later if needed, Disp: 10 tablet, Rfl: 5   SUMAtriptan  (IMITREX ) 50 MG tablet, Take 1 tablet (50 mg total) by mouth at the onset of headache. May repeat 1 dose after 2 hours if needed., Disp: 10 tablet, Rfl: 5   SUMAtriptan  (IMITREX ) 50 MG tablet, Take 1 tablet (50 mg total) by mouth at onset of headache,may repeat 1 dose after 2 hours if needed, 2 to 3 times a week, Disp: 9 tablet, Rfl: 6   terbinafine  (LAMISIL ) 250 MG tablet, Take 1 tablet (250 mg total) by mouth daily for Tinea, Disp: 14 tablet, Rfl: 0   traZODone  (DESYREL ) 50 MG tablet, Take 50 mg by mouth at bedtime., Disp: , Rfl:    traZODone  (DESYREL ) 50 MG tablet, TAKE 1 TO 2 TABLETS BY MOUTH AT BEDTIME, Disp: 180 tablet, Rfl: 1   traZODone  (DESYREL ) 50 MG tablet, Take 1-2 tablets (50-100 mg total) by mouth at bedtime., Disp: 180 tablet, Rfl: 1   WEGOVY  0.5 MG/0.5ML SOAJ, Inject 0.5 mg into the skin once a week for weight loss., Disp: 2 mL, Rfl: 0   WEGOVY  1 MG/0.5ML SOAJ, Inject 1 mg into the skin once a week for weight loss, Disp: 2 mL, Rfl: 0   WEGOVY  2.4 MG/0.75ML SOAJ, Inject 2.4 mg into the skin once a week for weight loss., Disp: 3 mL, Rfl: 0   WEGOVY  2.4 MG/0.75ML SOAJ, Inject 2.4 mg into the skin once a week fro weight loss, Disp: 3 mL, Rfl: 0   WEGOVY  2.4 MG/0.75ML  SOAJ, Inject 2.4 mg into the skin once a week for weight loss, Disp: 3 mL, Rfl: 0   WEGOVY  2.4 MG/0.75ML SOAJ, Inject 2.4 mg,sub-q, into the skin once a week for weight loss, Disp: 3 mL, Rfl: 0   WEGOVY  2.4 MG/0.75ML SOAJ, Inject 2.4 mg into the skin once a week for weight loss., Disp: 3 mL, Rfl: 0   WEGOVY  2.4 MG/0.75ML SOAJ, Inject 2.4 mg into the skin once a week for weight loss, Disp: 3 mL, Rfl: 0 Medication Side Effects: none  Family Medical/ Social History: Changes?   No  MENTAL HEALTH EXAM:  There were no vitals taken for this visit.There is no height or weight on file to calculate BMI.  General Appearance: Casual and Well Groomed  Eye Contact:  Good  Speech:  Clear and Coherent and Normal Rate  Volume:  Normal  Mood:  Euthymic  Affect:  Congruent  Thought Process:  Goal Directed and Descriptions of Associations: Circumstantial  Orientation:  Full (Time, Place, and Person)  Thought Content: Logical   Suicidal Thoughts:  No  Homicidal Thoughts:  No  Memory:  WNL  Judgement:  Good  Insight:  Good  Psychomotor Activity:  Normal  Concentration:  Concentration: Good and Attention Span: Good  Recall:  Good  Fund of Knowledge: Good  Language: Good  Assets:  Communication Skills Desire for Improvement Financial Resources/Insurance Housing Leisure Time Resilience Social Support Transportation Vocational/Educational  ADL's:  Intact  Cognition: WNL  Prognosis:  Good   ECT-MADRS    Flowsheet Row Clinical Support from 06/18/2024 in [facility] Video Visit from 03/20/2024 in [facility] Clinical Support from 01/23/2024 in [facility]  MADRS Total Score 3 20 18    Patient was administered Spravato  84 mg intranasally today.  The patient experienced the typical dissociation which gradually resolved over the 2-hour period of observation.  There were no complications.  Specifically the patient did  not have nausea or vomiting or headache.  Blood pressures remained within normal ranges at the 40-minute and 2-hour follow-up intervals.  Pulse ox were nl. By the time the 2-hour observation period was met the patient was alert and oriented and able to exit without assistance.  Patient feels the Spravato  administration is helpful for the treatment resistant depression and would like to continue the treatment.  See nursing note for further details.  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 Generalized anxiety disorder  F41.1       Receiving Psychotherapy: No   RECOMMENDATIONS:   PDMP reviewed.  Spravato  filled 12/31/2023. I provided approximately  60 minutes of face to face time during this encounter, including time spent before and after the visit in records review, medical decision making, counseling pertinent to today's visit, and charting.   We discussed the anxiety.  I think it may impart be caused by not eating the Spravato  for 2 weeks when she traveled [place].  Hopefully now that she will be back on a weekly schedule.  Anxiety will improve as well.  In the meantime, I recommend adding a benzodiazepine for as needed use. We discussed the risks of benzodiazepines including sedation, imbalance, and addictive potential.  She would like to try it.  Continue Wellbutrin  XL 150 mg daily. Continue Vraylar  1.5 mg, 1 p.o. daily. Continue Spravato  weekly. Continue Fetzima  120 mg, 1 p.o. daily. Start Ativan 0.5 mg, 1/2-1 p.o. every 8 hours as needed anxiety. Continue trazodone  50 mg, 1-2 nightly as needed sleep. Return in 1 week.  Verneita Cooks, PA-C

## 2024-06-25 ENCOUNTER — Ambulatory Visit (INDEPENDENT_AMBULATORY_CARE_PROVIDER_SITE_OTHER): Admitting: Physician Assistant

## 2024-06-25 ENCOUNTER — Encounter: Payer: Self-pay | Admitting: Physician Assistant

## 2024-06-25 ENCOUNTER — Telehealth: Payer: Self-pay | Admitting: Physician Assistant

## 2024-06-25 ENCOUNTER — Ambulatory Visit

## 2024-06-25 VITALS — BP 154/94 | HR 74

## 2024-06-25 DIAGNOSIS — F339 Major depressive disorder, recurrent, unspecified: Secondary | ICD-10-CM

## 2024-06-25 DIAGNOSIS — F411 Generalized anxiety disorder: Secondary | ICD-10-CM

## 2024-06-25 DIAGNOSIS — F4321 Adjustment disorder with depressed mood: Secondary | ICD-10-CM

## 2024-06-25 NOTE — Progress Notes (Signed)
 NURSES NOTE:   Pt arrived for her # 56 Spravato  Treatment for treatment resistant depression, the starting dose was 56 mg (2 of the 28 mg nasal sprays) pt stayed at that dose for the first 4 treatments and also takes Zofran  and Meclizine  prior to arrival at office. Pt received 84 mg (3 of the 28 mg nasal sprays) total again today and did also premedicate. Pt is now using her medical benefits and receives Spravato  through [facility] [employer], today is the first time using it. Pt sees Angeline Sayers, NP and she will follow her throughout her treatments and follow ups. Spravato  medication is stored at treatment center per REMS/FDA guidelines. The medication is required to be locked behind two doors per REMS/FDA protocol. Medication is also disposed of properly after each use per regulations. All documentation for REMS is completed and submitted per FDA/REMS requirements.          Pt directed to treatment room, started vitals at 10:05 AM, 137/84, pulse 77, SpO2 98%. Pt did take Zofran  4 mg and Meclizine  25 mg prior to arriving at office, an hour ahead. Instructed patient to blow her nose if needed then recline back to a 45 degree angle if that was more comfortable for her. Gave patient first dose 28 mg nasal spray, administered in each nostril as directed and observed by nurse, waited 5 more minutes for the second and third doses. After all doses given pt did not complain of nausea. pt keeps water and some mints to help with the taste of Spravato  it gives the metallic taste. Her 40 minute check was at 10:50 AM, vital signs were 155/93, pulse 75, SpO2 99%. Pt aware she would be monitored for a total time of 120 minutes.  Discharge vitals were taken at 11:56 AM 154/94 P 74, SpO2 99%. Pt met with Verneita Cooks, PA-C to discuss her treatment. Pt advised to relax the rest of the day. No driving, no intense activities. Verbalized understanding. Pt reports no issues today. Nurse was with pt a total of 60 minutes for clinical  assessment.  Pt instructed to call office with any problems or questions prior to next treatment. She is scheduled next Thursday.      (84 mg) LOT [id number] EXP AUG 2028

## 2024-06-25 NOTE — Telephone Encounter (Signed)
 Disregard message above, received fax with [employer] renewal for Spravato  84 mg approval 06/19/24-12/17/24 With [employer] Case #[id number]

## 2024-06-25 NOTE — Telephone Encounter (Signed)
 [employer]? Programmer, Systems id showed [facility]) called at 10:22a stating they need more info for the PA for Spravato  for this pt.  Pls call them back at [phone] and provide pt's name, DOB and [employer] ID# [id number] for them to look her up.

## 2024-06-25 NOTE — Progress Notes (Signed)
 [facility]  Patient ID: Robin Stevenson,  MRN: [id number]  PCP: Signa Rush, MD (Inactive)  Date of Evaluation: 06/27/2024 Time spent:65 minutes  Chief Complaint:  Chief Complaint   Depression; Other    HISTORY/CURRENT STATUS: HPI for Spravato  treatment, she sees Angeline Sayers, NP who is out of the office today.  Sadly her 49 yo dog died a few days ago.  It's especially tough because her daughter is in [place] for the semester, and she would like to be able to comfort her.  I'm just sad.  Energy and motivation are good.  No extreme sadness, tearfulness, or feelings of hopelessness.  Sleeps ok.  She took the Ativan to help sleep and it knocked her out.  Plans to take  1/2 pill if needed in the future.  ADLs and personal hygiene are normal.   Denies any changes in concentration, making decisions, or remembering things.  Appetite has not changed.  Weight is stable.   No mania, delirium, AH/VH.  No SI/HI.  Individual Medical History/ Review of Systems: Changes? :No   Past medications for mental health diagnoses include: Spravato , Xanax , Wellbutrin , Vraylar , gabapentin , Fetzima , trazodone   Allergies: Cat dander and Brewers yeast  Current Medications:  Current Outpatient Medications:    ALPRAZolam  (XANAX ) 0.5 MG tablet, Take 1 tablet at bedtime the night before procedure. Take 1 tablet 1 hour before arrival. Bring third tablet with you to procedure., Disp: 3 tablet, Rfl: 0   atorvastatin  (LIPITOR) 20 MG tablet, Take 1 tablet (20 mg total) by mouth daily., Disp: 90 tablet, Rfl: 3   atovaquone -proguanil (MALARONE ) 250-100 MG TABS tablet, Take 1 tablet by mouth once daily begin 2 days prearrival,take daily during stay & continue for 7 days posttravel for malaria prevention, Disp: 24 tablet, Rfl: 0   azithromycin  (ZITHROMAX ) 500 MG tablet, For non bloody diarrhea,take 2 tabs on day 1, if resolved,stop med,If diarrhea persists 1 tab on day 2 & 3. For bloody diarrhea,2 tabs  on day 1 & 1 tab on day 2 & 3, Disp: 4 tablet, Rfl: 0   buPROPion  (WELLBUTRIN  XL) 150 MG 24 hr tablet, Take 1 tablet (150 mg total) by mouth every morning., Disp: 30 tablet, Rfl: 2   cariprazine  (VRAYLAR ) 1.5 MG capsule, Take 1 capsule (1.5 mg total) by mouth daily., Disp: 28 capsule, Rfl: 0   celecoxib  (CELEBREX ) 200 MG capsule, Take 1 capsule by mouth single dose as directed. Take 1 hour prior to arrival for surgery, Disp: 1 capsule, Rfl: 0   Esketamine HCl, 84 MG Dose, (SPRAVATO , 84 MG DOSE,) 28 MG/DEVICE SOPK, Dispense X 3 (28 mg) devices for treatments every 7 days, administer intranasally, Disp: 3 each, Rfl: 3   esomeprazole (NEXIUM) 20 MG capsule, Take 20 mg by mouth once., Disp: , Rfl:    fluocinonide  ointment (LIDEX ) 0.05 %, Apply as directed to skin twice a day, Disp: 30 g, Rfl: 0   gabapentin  (NEURONTIN ) 300 MG capsule, Take 1 capsule (300 mg total) by mouth 3 (three) times daily. Take first dose 1 hour before arrival for surgery., Disp: 21 capsule, Rfl: 0   hydrocortisone  2.5 % cream, Apply a small amount to affected area twice daily for 1-2 weeks as directed., Disp: 20 g, Rfl: 0   ketoconazole  (NIZORAL ) 2 % cream, Apply a small amount to affected area on left foot/toe once a day, Disp: 30 g, Rfl: 0   Levomilnacipran  HCl ER (FETZIMA ) 120 MG CP24, Take 1 capsule by mouth daily., Disp: 90  capsule, Rfl: 3   LORazepam (ATIVAN) 0.5 MG tablet, Take 0.5-1 tablets (0.25-0.5 mg total) by mouth every 8 (eight) hours as needed for anxiety., Disp: 30 tablet, Rfl: 0   meclizine  (ANTIVERT ) 25 MG tablet, Take 1 tablet (25 mg total) by mouth as directed prior to treatment., Disp: 60 tablet, Rfl: 0   metFORMIN  (GLUCOPHAGE ) 500 MG tablet, Take 1 tablet (500 mg total) by mouth daily., Disp: 90 tablet, Rfl: 1   metFORMIN  (GLUCOPHAGE ) 500 MG tablet, Take 1 tablet (500 mg total) by mouth 2 (two) times daily., Disp: 180 tablet, Rfl: 1   metFORMIN  (GLUCOPHAGE ) 500 MG tablet, Take 1 tablet (500 mg total) by mouth  2 (two) times daily., Disp: 90 tablet, Rfl: 1   metFORMIN  (GLUCOPHAGE -XR) 500 MG 24 hr tablet, Take 1 tablet (500 mg total) by mouth daily. Please do not fill if not picked up within 30 days., Disp: 30 tablet, Rfl: 0   methocarbamol  (ROBAXIN ) 500 MG tablet, Take 1 tablet (500 mg total) by mouth every 6 (six) hours as needed for muscle spasms. (Patient not taking: Reported on 10/29/2023), Disp: 45 tablet, Rfl: 0   Norethindrone-Ethinyl Estradiol -Fe Biphas (LO LOESTRIN FE ) 1 MG-10 MCG / 10 MCG tablet, Take 1 tablet by mouth daily., Disp: 28 tablet, Rfl: 4   ondansetron  (ZOFRAN ) 4 MG tablet, Take 1 tablet  by mouth every 6 hours as needed for nausea, Disp: 15 tablet, Rfl: 0   ondansetron  (ZOFRAN ) 4 MG tablet, Take 1 tablet (4 mg total) by mouth every 6 (six) hours for nausea. TAKE 4 tablets 15 minutes prior to arrival for surgery, Disp: 30 tablet, Rfl: 0   ondansetron  (ZOFRAN -ODT) 4 MG disintegrating tablet, Dissolve 1 tablet (4 mg total) by mouth every 8 (eight) hours as needed for nausea or vomiting., Disp: 20 tablet, Rfl: 1   oxyCODONE  (OXY IR/ROXICODONE ) 5 MG immediate release tablet, Take 1-2 tablet by mouth every six to eight hours as needed for Post Op Pain. DO NOT EXCEED  6 tablets in 24 hours., Disp: 30 tablet, Rfl: 0   polyethylene glycol (MIRALAX / GLYCOLAX) packet, Take 17 g by mouth daily., Disp: , Rfl:    polyethylene glycol-electrolytes (NULYTELY) 420 g solution, Take 4,000 mLs by mouth Nightly., Disp: 4000 mL, Rfl: 0   Semaglutide -Weight Management (WEGOVY ) 2.4 MG/0.75ML SOAJ, Inject 2.4 mg into the skin once a week for weight loss., Disp: 3 mL, Rfl: 0   sulfamethoxazole -trimethoprim  (BACTRIM  DS) 800-160 MG tablet, Take 1 tablet by mouth 2 (two) times daily as directed. Start the day after surgery., Disp: 10 tablet, Rfl: 0   SUMAtriptan  (IMITREX ) 50 MG tablet, TAKE 1 TABLET BY MOUTH AT ONSET OF HEADACHE, MAY REPEAT 1 DOSE AFTER 2 HOURS IF NEEDED, Disp: 10 tablet, Rfl: 5   SUMAtriptan   (IMITREX ) 50 MG tablet, Take 1 tablet by mouth at onset of headache, may repeat same dose once 2 hours later if needed, Disp: 10 tablet, Rfl: 5   SUMAtriptan  (IMITREX ) 50 MG tablet, Take 1 tablet (50 mg total) by mouth at the onset of headache. May repeat 1 dose after 2 hours if needed., Disp: 10 tablet, Rfl: 5   SUMAtriptan  (IMITREX ) 50 MG tablet, Take 1 tablet (50 mg total) by mouth at onset of headache,may repeat 1 dose after 2 hours if needed, 2 to 3 times a week, Disp: 9 tablet, Rfl: 6   SUMAtriptan  (IMITREX ) 50 MG tablet, Take 1 tablet (50 mg total) by mouth as needed at onset of headache. May  repeat 1 dose after 2 hours if needed. Only take 2-3 times a week., Disp: 9 tablet, Rfl: 6   terbinafine  (LAMISIL ) 250 MG tablet, Take 1 tablet (250 mg total) by mouth daily for Tinea, Disp: 14 tablet, Rfl: 0   traZODone  (DESYREL ) 50 MG tablet, Take 50 mg by mouth at bedtime., Disp: , Rfl:    traZODone  (DESYREL ) 50 MG tablet, TAKE 1 TO 2 TABLETS BY MOUTH AT BEDTIME, Disp: 180 tablet, Rfl: 1   traZODone  (DESYREL ) 50 MG tablet, Take 1-2 tablets (50-100 mg total) by mouth at bedtime., Disp: 180 tablet, Rfl: 1   traZODone  (DESYREL ) 50 MG tablet, Take 1-2 tablets (50-100 mg total) by mouth at bedtime., Disp: 180 tablet, Rfl: 1   WEGOVY  0.5 MG/0.5ML SOAJ, Inject 0.5 mg into the skin once a week for weight loss., Disp: 2 mL, Rfl: 0   WEGOVY  1 MG/0.5ML SOAJ, Inject 1 mg into the skin once a week for weight loss, Disp: 2 mL, Rfl: 0   WEGOVY  2.4 MG/0.75ML SOAJ, Inject 2.4 mg into the skin once a week for weight loss., Disp: 3 mL, Rfl: 0   WEGOVY  2.4 MG/0.75ML SOAJ, Inject 2.4 mg into the skin once a week fro weight loss, Disp: 3 mL, Rfl: 0   WEGOVY  2.4 MG/0.75ML SOAJ, Inject 2.4 mg into the skin once a week for weight loss, Disp: 3 mL, Rfl: 0   WEGOVY  2.4 MG/0.75ML SOAJ, Inject 2.4 mg,sub-q, into the skin once a week for weight loss, Disp: 3 mL, Rfl: 0   WEGOVY  2.4 MG/0.75ML SOAJ, Inject 2.4 mg into the skin once a  week for weight loss., Disp: 3 mL, Rfl: 0   WEGOVY  2.4 MG/0.75ML SOAJ, Inject 2.4 mg into the skin once a week for weight loss., Disp: 3 mL, Rfl: 0   WEGOVY  2.4 MG/0.75ML SOAJ, Inject 2.4 mg into the skin once a week for weight loss, Disp: 3 mL, Rfl: 0 Medication Side Effects: none  Family Medical/ Social History: Changes?   No  MENTAL HEALTH EXAM:  There were no vitals taken for this visit.There is no height or weight on file to calculate BMI.  General Appearance: Casual and Well Groomed  Eye Contact:  Good  Speech:  Clear and Coherent and Normal Rate  Volume:  Normal  Mood:  sad  Affect:  Congruent  Thought Process:  Goal Directed and Descriptions of Associations: Circumstantial  Orientation:  Full (Time, Place, and Person)  Thought Content: Logical   Suicidal Thoughts:  No  Homicidal Thoughts:  No  Memory:  WNL  Judgement:  Good  Insight:  Good  Psychomotor Activity:  Normal  Concentration:  Concentration: Good and Attention Span: Good  Recall:  Good  Fund of Knowledge: Good  Language: Good  Assets:  Communication Skills Desire for Improvement Financial Resources/Insurance Housing Leisure Time Resilience Social Support Transportation Vocational/Educational  ADL's:  Intact  Cognition: WNL  Prognosis:  Good   ECT-MADRS    Flowsheet Row Clinical Support from 06/18/2024 in [facility] Video Visit from 03/20/2024 in [facility] Clinical Support from 01/23/2024 in [facility]  MADRS Total Score 3 20 18    Patient was administered Spravato  84 mg intranasally today.  The patient experienced the typical dissociation which gradually resolved over the 2-hour period of observation.  There were no complications.  Specifically the patient did not have nausea or vomiting or headache.  Blood pressures remained within normal ranges at the  40-minute and 2-hour follow-up intervals.  Pulse ox were nl. By  the time the 2-hour observation period was met the patient was alert and oriented and able to exit without assistance.  Patient feels the Spravato  administration is helpful for the treatment resistant depression and would like to continue the treatment.  See nursing note for further details.  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 Generalized anxiety disorder  F41.1     3. Grief  F43.21       Receiving Psychotherapy: No   RECOMMENDATIONS:   PDMP reviewed.  Spravato  filled 12/31/2023. Ativan filled 06/18/2024.  I provided approximately  65 minutes of face to face time during this encounter, including time spent before and after the visit in records review, medical decision making, counseling pertinent to today's visit, and charting.   My sympathy in the loss of her dog.   As far as her meds go, she's doing well so no changes are needed.    Continue Wellbutrin  XL 150 mg daily. Continue Vraylar  1.5 mg, 1 p.o. daily. Continue Spravato  weekly. Continue Fetzima  120 mg, 1 p.o. daily. Continue Ativan 0.5 mg, 1/2-1 p.o. every 8 hours as needed anxiety. Continue trazodone  50 mg, 1-2 nightly as needed sleep. Return in 1 week.  Verneita Cooks, PA-C

## 2024-06-26 ENCOUNTER — Encounter

## 2024-06-26 ENCOUNTER — Encounter: Admitting: Physician Assistant

## 2024-07-03 ENCOUNTER — Encounter: Payer: Self-pay | Admitting: Physician Assistant

## 2024-07-03 ENCOUNTER — Ambulatory Visit

## 2024-07-03 ENCOUNTER — Ambulatory Visit: Admitting: Physician Assistant

## 2024-07-03 VITALS — BP 156/98 | HR 70

## 2024-07-03 DIAGNOSIS — F411 Generalized anxiety disorder: Secondary | ICD-10-CM

## 2024-07-03 DIAGNOSIS — F339 Major depressive disorder, recurrent, unspecified: Secondary | ICD-10-CM

## 2024-07-03 NOTE — Progress Notes (Signed)
 NURSES NOTE:   Pt arrived for her # 100 Spravato  Treatment for treatment resistant depression, the starting dose was 56 mg (2 of the 28 mg nasal sprays) pt stayed at that dose for the first 4 treatments and also takes Zofran  and Meclizine  prior to arrival at office. Pt received 84 mg (3 of the 28 mg nasal sprays) total again today and did also premedicate. Pt is now using her medical benefits and receives Spravato  through [facility] and [facility], today is the first time using it. Pt sees Angeline Sayers, NP and she will follow her throughout her treatments and follow ups. Spravato  medication is stored at treatment center per REMS/FDA guidelines. The medication is required to be locked behind two doors per REMS/FDA protocol. Medication is also disposed of properly after each use per regulations. All documentation for REMS is completed and submitted per FDA/REMS requirements.          Pt directed to treatment room, started vitals at 10:05 AM, 138/97, pulse 77, SpO2 98%. Pt did take Zofran  4 mg and Meclizine  25 mg prior to arriving at office, an hour ahead. Instructed patient to blow her nose if needed then recline back to a 45 degree angle if that was more comfortable for her. Gave patient first dose 28 mg nasal spray, administered in each nostril as directed and observed by nurse, waited 5 more minutes for the second and third doses. After all doses given pt did not complain of nausea. pt keeps water and some mints to help with the taste of Spravato  it gives the metallic taste. Her 40 minute check was at 10:50 AM, vital signs were 175/105, pulse 76, SpO2 98%, 171/103, rechecked 15 minutes later 161/102, 162/98. Verneita and [place] both aware of elevated B/P's and pt having a lot of stressful issues currently. Pt aware she would be monitored for a total time of 120 minutes.  Discharge vitals were taken at 11:59 AM 156/98 P 70, SpO2 99%. Pt met with Verneita Cooks, PA-C to discuss her treatment. Pt advised to relax the rest of  the day. No driving, no intense activities. Verbalized understanding. Pt reports no issues today. Nurse was with pt a total of 60 minutes for clinical assessment.  Pt instructed to call office with any problems or questions prior to next treatment. She is scheduled next Thursday again.      (84 mg) LOT [id number] EXP AUG 2028

## 2024-07-03 NOTE — Progress Notes (Signed)
 [facility]  Patient ID: Robin Stevenson,  MRN: [id number]  PCP: Signa Rush, MD (Inactive)  Date of Evaluation: 07/03/2024 Time spent:65 minutes  Chief Complaint:  Chief Complaint   Depression; Other    HISTORY/CURRENT STATUS: HPI for Spravato  treatment, she sees Angeline Sayers, NP who is out of the office today.  Doing well with the Spravato .  She is able to enjoy things.  Energy and motivation are good.   No extreme sadness, tearfulness, or feelings of hopelessness.  Sleeps ok.  ADLs and personal hygiene are normal.   Appetite is nl.  Some stressors w/ aging parents helping get them ready to move.  No mania, delirium, AH/VH.  No SI/HI.  Individual Medical History/ Review of Systems: Changes? :No   Past medications for mental health diagnoses include: Spravato , Xanax , Wellbutrin , Vraylar , gabapentin , Fetzima , trazodone   Allergies: Cat dander and Brewers yeast  Current Medications:  Current Outpatient Medications:    atorvastatin  (LIPITOR) 20 MG tablet, Take 1 tablet (20 mg total) by mouth daily., Disp: 90 tablet, Rfl: 3   buPROPion  (WELLBUTRIN  XL) 150 MG 24 hr tablet, Take 1 tablet (150 mg total) by mouth every morning., Disp: 30 tablet, Rfl: 2   cariprazine  (VRAYLAR ) 1.5 MG capsule, Take 1 capsule (1.5 mg total) by mouth daily., Disp: 28 capsule, Rfl: 0   Esketamine HCl, 84 MG Dose, (SPRAVATO , 84 MG DOSE,) 28 MG/DEVICE SOPK, Dispense X 3 (28 mg) devices for treatments every 7 days, administer intranasally, Disp: 3 each, Rfl: 3   gabapentin  (NEURONTIN ) 300 MG capsule, Take 1 capsule (300 mg total) by mouth 3 (three) times daily. Take first dose 1 hour before arrival for surgery., Disp: 21 capsule, Rfl: 0   Levomilnacipran  HCl ER (FETZIMA ) 120 MG CP24, Take 1 capsule by mouth daily., Disp: 90 capsule, Rfl: 3   LORazepam (ATIVAN) 0.5 MG tablet, Take 0.5-1 tablets (0.25-0.5 mg total) by mouth every 8 (eight) hours as needed for anxiety., Disp: 30 tablet, Rfl: 0    traZODone  (DESYREL ) 50 MG tablet, Take 50 mg by mouth at bedtime., Disp: , Rfl:    ALPRAZolam  (XANAX ) 0.5 MG tablet, Take 1 tablet at bedtime the night before procedure. Take 1 tablet 1 hour before arrival. Bring third tablet with you to procedure., Disp: 3 tablet, Rfl: 0   atovaquone -proguanil (MALARONE ) 250-100 MG TABS tablet, Take 1 tablet by mouth once daily begin 2 days prearrival,take daily during stay & continue for 7 days posttravel for malaria prevention, Disp: 24 tablet, Rfl: 0   azithromycin  (ZITHROMAX ) 500 MG tablet, For non bloody diarrhea,take 2 tabs on day 1, if resolved,stop med,If diarrhea persists 1 tab on day 2 & 3. For bloody diarrhea,2 tabs on day 1 & 1 tab on day 2 & 3, Disp: 4 tablet, Rfl: 0   celecoxib  (CELEBREX ) 200 MG capsule, Take 1 capsule by mouth single dose as directed. Take 1 hour prior to arrival for surgery, Disp: 1 capsule, Rfl: 0   esomeprazole (NEXIUM) 20 MG capsule, Take 20 mg by mouth once., Disp: , Rfl:    fluocinonide  ointment (LIDEX ) 0.05 %, Apply as directed to skin twice a day, Disp: 30 g, Rfl: 0   hydrocortisone  2.5 % cream, Apply a small amount to affected area twice daily for 1-2 weeks as directed., Disp: 20 g, Rfl: 0   ketoconazole  (NIZORAL ) 2 % cream, Apply a small amount to affected area on left foot/toe once a day, Disp: 30 g, Rfl: 0   meclizine  (ANTIVERT ) 25  MG tablet, Take 1 tablet (25 mg total) by mouth as directed prior to treatment., Disp: 60 tablet, Rfl: 0   metFORMIN  (GLUCOPHAGE ) 500 MG tablet, Take 1 tablet (500 mg total) by mouth daily., Disp: 90 tablet, Rfl: 1   metFORMIN  (GLUCOPHAGE ) 500 MG tablet, Take 1 tablet (500 mg total) by mouth 2 (two) times daily., Disp: 180 tablet, Rfl: 1   metFORMIN  (GLUCOPHAGE ) 500 MG tablet, Take 1 tablet (500 mg total) by mouth 2 (two) times daily., Disp: 90 tablet, Rfl: 1   metFORMIN  (GLUCOPHAGE -XR) 500 MG 24 hr tablet, Take 1 tablet (500 mg total) by mouth daily. Please do not fill if not picked up within 30  days., Disp: 30 tablet, Rfl: 0   methocarbamol  (ROBAXIN ) 500 MG tablet, Take 1 tablet (500 mg total) by mouth every 6 (six) hours as needed for muscle spasms. (Patient not taking: Reported on 10/29/2023), Disp: 45 tablet, Rfl: 0   Norethindrone-Ethinyl Estradiol -Fe Biphas (LO LOESTRIN FE ) 1 MG-10 MCG / 10 MCG tablet, Take 1 tablet by mouth daily., Disp: 28 tablet, Rfl: 4   ondansetron  (ZOFRAN ) 4 MG tablet, Take 1 tablet  by mouth every 6 hours as needed for nausea, Disp: 15 tablet, Rfl: 0   ondansetron  (ZOFRAN ) 4 MG tablet, Take 1 tablet (4 mg total) by mouth every 6 (six) hours for nausea. TAKE 4 tablets 15 minutes prior to arrival for surgery, Disp: 30 tablet, Rfl: 0   ondansetron  (ZOFRAN -ODT) 4 MG disintegrating tablet, Dissolve 1 tablet (4 mg total) by mouth every 8 (eight) hours as needed for nausea or vomiting., Disp: 20 tablet, Rfl: 1   oxyCODONE  (OXY IR/ROXICODONE ) 5 MG immediate release tablet, Take 1-2 tablet by mouth every six to eight hours as needed for Post Op Pain. DO NOT EXCEED  6 tablets in 24 hours., Disp: 30 tablet, Rfl: 0   polyethylene glycol (MIRALAX / GLYCOLAX) packet, Take 17 g by mouth daily., Disp: , Rfl:    polyethylene glycol-electrolytes (NULYTELY) 420 g solution, Take 4,000 mLs by mouth Nightly., Disp: 4000 mL, Rfl: 0   Semaglutide -Weight Management (WEGOVY ) 2.4 MG/0.75ML SOAJ, Inject 2.4 mg into the skin once a week for weight loss., Disp: 3 mL, Rfl: 0   sulfamethoxazole -trimethoprim  (BACTRIM  DS) 800-160 MG tablet, Take 1 tablet by mouth 2 (two) times daily as directed. Start the day after surgery., Disp: 10 tablet, Rfl: 0   SUMAtriptan  (IMITREX ) 50 MG tablet, TAKE 1 TABLET BY MOUTH AT ONSET OF HEADACHE, MAY REPEAT 1 DOSE AFTER 2 HOURS IF NEEDED, Disp: 10 tablet, Rfl: 5   SUMAtriptan  (IMITREX ) 50 MG tablet, Take 1 tablet by mouth at onset of headache, may repeat same dose once 2 hours later if needed, Disp: 10 tablet, Rfl: 5   SUMAtriptan  (IMITREX ) 50 MG tablet, Take 1  tablet (50 mg total) by mouth at the onset of headache. May repeat 1 dose after 2 hours if needed., Disp: 10 tablet, Rfl: 5   SUMAtriptan  (IMITREX ) 50 MG tablet, Take 1 tablet (50 mg total) by mouth at onset of headache,may repeat 1 dose after 2 hours if needed, 2 to 3 times a week, Disp: 9 tablet, Rfl: 6   SUMAtriptan  (IMITREX ) 50 MG tablet, Take 1 tablet (50 mg total) by mouth as needed at onset of headache. May repeat 1 dose after 2 hours if needed. Only take 2-3 times a week., Disp: 9 tablet, Rfl: 6   terbinafine  (LAMISIL ) 250 MG tablet, Take 1 tablet (250 mg total) by mouth daily for  Tinea, Disp: 14 tablet, Rfl: 0   traZODone  (DESYREL ) 50 MG tablet, TAKE 1 TO 2 TABLETS BY MOUTH AT BEDTIME, Disp: 180 tablet, Rfl: 1   traZODone  (DESYREL ) 50 MG tablet, Take 1-2 tablets (50-100 mg total) by mouth at bedtime., Disp: 180 tablet, Rfl: 1   traZODone  (DESYREL ) 50 MG tablet, Take 1-2 tablets (50-100 mg total) by mouth at bedtime., Disp: 180 tablet, Rfl: 1   WEGOVY  0.5 MG/0.5ML SOAJ, Inject 0.5 mg into the skin once a week for weight loss., Disp: 2 mL, Rfl: 0   WEGOVY  1 MG/0.5ML SOAJ, Inject 1 mg into the skin once a week for weight loss, Disp: 2 mL, Rfl: 0   WEGOVY  2.4 MG/0.75ML SOAJ, Inject 2.4 mg into the skin once a week for weight loss., Disp: 3 mL, Rfl: 0   WEGOVY  2.4 MG/0.75ML SOAJ, Inject 2.4 mg into the skin once a week fro weight loss, Disp: 3 mL, Rfl: 0   WEGOVY  2.4 MG/0.75ML SOAJ, Inject 2.4 mg into the skin once a week for weight loss, Disp: 3 mL, Rfl: 0   WEGOVY  2.4 MG/0.75ML SOAJ, Inject 2.4 mg,sub-q, into the skin once a week for weight loss, Disp: 3 mL, Rfl: 0   WEGOVY  2.4 MG/0.75ML SOAJ, Inject 2.4 mg into the skin once a week for weight loss., Disp: 3 mL, Rfl: 0   WEGOVY  2.4 MG/0.75ML SOAJ, Inject 2.4 mg into the skin once a week for weight loss., Disp: 3 mL, Rfl: 0   WEGOVY  2.4 MG/0.75ML SOAJ, Inject 2.4 mg into the skin once a week for weight loss, Disp: 3 mL, Rfl: 0 Medication Side  Effects: none  Family Medical/ Social History: Changes?   No  MENTAL HEALTH EXAM:  There were no vitals taken for this visit.There is no height or weight on file to calculate BMI.  General Appearance: Casual and Well Groomed  Eye Contact:  Good  Speech:  Clear and Coherent and Normal Rate  Volume:  Normal  Mood:  Euthymic  Affect:  Congruent  Thought Process:  Goal Directed and Descriptions of Associations: Circumstantial  Orientation:  Full (Time, Place, and Person)  Thought Content: Logical   Suicidal Thoughts:  No  Homicidal Thoughts:  No  Memory:  WNL  Judgement:  Good  Insight:  Good  Psychomotor Activity:  Normal  Concentration:  Concentration: Good and Attention Span: Good  Recall:  Good  Fund of Knowledge: Good  Language: Good  Assets:  Communication Skills Desire for Improvement Financial Resources/Insurance Housing Leisure Time Resilience Social Support Transportation Vocational/Educational  ADL's:  Intact  Cognition: WNL  Prognosis:  Good   ECT-MADRS    Flowsheet Row Clinical Support from 06/18/2024 in [facility] Video Visit from 03/20/2024 in [facility] Clinical Support from 01/23/2024 in [facility]  MADRS Total Score 3 20 18    She was administered Spravato  84 mg intranasally today.  The patient experienced the typical dissociation which gradually resolved over the 2-hour period of observation.  There were no complications.  Specifically the patient did not have nausea or vomiting or headache.  Blood pressures were slightly elevated at the 40-minute and 2-hour follow-up intervals.  This has occurred before when she was more stressed.  Pulse ox were nl.  By the time the 2-hour observation period was met the patient was alert and oriented and able to exit without assistance.  Patient feels the Spravato  administration is helpful for the treatment resistant depression  and would  like to continue the treatment.  See nursing note for further details.  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 Generalized anxiety disorder  F41.1       Receiving Psychotherapy: No   RECOMMENDATIONS:   PDMP reviewed.  Spravato  filled 12/31/2023. Ativan filled 06/18/2024.  I provided approximately 65 minutes of face to face time during this encounter, including time spent before and after the visit in records review, medical decision making, counseling pertinent to today's visit, and charting.   [name] is doing well so no changes need to be made. She will check BPs at home and disc w/ PCP if needed.   Continue Wellbutrin  XL 150 mg daily. Continue Vraylar  1.5 mg, 1 p.o. daily. Continue Spravato  weekly. Continue Fetzima  120 mg, 1 p.o. daily. Continue Ativan 0.5 mg, 1/2-1 p.o. every 8 hours as needed anxiety. Continue trazodone  50 mg, 1-2 nightly as needed sleep. Return in 1 week.  Verneita Cooks, PA-C

## 2024-07-07 ENCOUNTER — Encounter: Payer: Self-pay | Admitting: Physician Assistant

## 2024-07-10 ENCOUNTER — Ambulatory Visit

## 2024-07-10 ENCOUNTER — Ambulatory Visit (INDEPENDENT_AMBULATORY_CARE_PROVIDER_SITE_OTHER): Admitting: Adult Health

## 2024-07-10 ENCOUNTER — Encounter: Payer: Self-pay | Admitting: Adult Health

## 2024-07-10 VITALS — BP 155/102 | HR 71

## 2024-07-10 DIAGNOSIS — D2239 Melanocytic nevi of other parts of face: Secondary | ICD-10-CM | POA: Diagnosis not present

## 2024-07-10 DIAGNOSIS — D2272 Melanocytic nevi of left lower limb, including hip: Secondary | ICD-10-CM | POA: Diagnosis not present

## 2024-07-10 DIAGNOSIS — D235 Other benign neoplasm of skin of trunk: Secondary | ICD-10-CM | POA: Diagnosis not present

## 2024-07-10 DIAGNOSIS — D2262 Melanocytic nevi of left upper limb, including shoulder: Secondary | ICD-10-CM | POA: Diagnosis not present

## 2024-07-10 DIAGNOSIS — F339 Major depressive disorder, recurrent, unspecified: Secondary | ICD-10-CM | POA: Diagnosis not present

## 2024-07-10 DIAGNOSIS — D2339 Other benign neoplasm of skin of other parts of face: Secondary | ICD-10-CM | POA: Diagnosis not present

## 2024-07-10 DIAGNOSIS — D2361 Other benign neoplasm of skin of right upper limb, including shoulder: Secondary | ICD-10-CM | POA: Diagnosis not present

## 2024-07-10 DIAGNOSIS — D225 Melanocytic nevi of trunk: Secondary | ICD-10-CM | POA: Diagnosis not present

## 2024-07-10 DIAGNOSIS — D2261 Melanocytic nevi of right upper limb, including shoulder: Secondary | ICD-10-CM | POA: Diagnosis not present

## 2024-07-10 DIAGNOSIS — D485 Neoplasm of uncertain behavior of skin: Secondary | ICD-10-CM | POA: Diagnosis not present

## 2024-07-10 DIAGNOSIS — D2271 Melanocytic nevi of right lower limb, including hip: Secondary | ICD-10-CM | POA: Diagnosis not present

## 2024-07-10 NOTE — Progress Notes (Signed)
 Robin Stevenson [id number] 10/04/1975 48 y.o.  Subjective:   Patient ID:  Robin Stevenson is a 48 y.o. (DOB 1976/05/27) female.  Chief Complaint: No chief complaint on file.   HPI Robin Stevenson presents to the office today for follow-up of (TRD).  Patient was administered Spravato  84 mg intranasally today. Patient was observed by provider throughout Spravato  treatment. The patient experienced the typical dissociation which gradually resolved over the 2-hour period of observation. There were no complications. Specifically, the patient did not have any untoward side effects - feeling disconnected from themself, their thoughts, feelings and things around them, dizziness, nausea, feeling sleepy, decreased feeling of sensitivity (numbness) spinning sensation, feeling anxious, lack of energy, increased blood pressure, feeling happy or very excited, or headache. Blood pressures remained within normal ranges at the 40-minute and 2-hour follow-up intervals. By the time the 2-hour observation period was met the patient was alert and oriented and able to exit without assistance. 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 See nursing note for further details.  [name] reports a positive experience today with Spravato .   Describes mood today as ok. Pleasant. Mood symptoms - reports  decreased depression with Spravato  treatment today. Reports Spravato  is helpful for mood symptoms. She reports a lower mood with recent loss of family dog. She denies anxiety and irritability. Reports varying interest and motivation. Denies panic attacks. Reports some worry. Denies rumination and over thinking. Denies obsessive thoughts or acts. Reports mood as improved. Taking medications as prescribed. Energy levels lower. Active, does not have a regular exercise routine.   Enjoys some usual interests and activities.   Appetite adequate. Weight stable. Sleeps well  most nights. Averages 9 to 10 hours. Focus and concentration stable. Completing tasks. Managing aspects of household.  Denies SI or HI.  Denies AH or VH.   ECT-MADRS    Flowsheet Row Clinical Support from 06/18/2024 in [facility] Video Visit from 03/20/2024 in [facility] Clinical Support from 01/23/2024 in [facility]  MADRS Total Score 3 20 18      Review of Systems:  Review of Systems  Musculoskeletal:  Negative for gait problem.  Neurological:  Negative for tremors.  Psychiatric/Behavioral:         Please refer to HPI    Medications: I have reviewed the patient's current medications.  Current Outpatient Medications  Medication Sig Dispense Refill   ALPRAZolam  (XANAX ) 0.5 MG tablet Take 1 tablet at bedtime the night before procedure. Take 1 tablet 1 hour before arrival. Bring third tablet with you to procedure. 3 tablet 0   atorvastatin  (LIPITOR) 20 MG tablet Take 1 tablet (20 mg total) by mouth daily. 90 tablet 3   atovaquone -proguanil (MALARONE ) 250-100 MG TABS tablet Take 1 tablet by mouth once daily begin 2 days prearrival,take daily during stay & continue for 7 days posttravel for malaria prevention 24 tablet 0   azithromycin  (ZITHROMAX ) 500 MG tablet For non bloody diarrhea,take 2 tabs on day 1, if resolved,stop med,If diarrhea persists 1 tab on day 2 & 3. For bloody diarrhea,2 tabs on day 1 & 1 tab on day 2 & 3 4 tablet 0   buPROPion  (WELLBUTRIN  XL) 150 MG 24 hr tablet Take 1 tablet (150 mg total) by mouth every morning. 30 tablet 2   cariprazine  (VRAYLAR ) 1.5 MG capsule Take 1 capsule (1.5 mg total) by mouth daily. 28 capsule 0   celecoxib  (CELEBREX ) 200  MG capsule Take 1 capsule by mouth single dose as directed. Take 1 hour prior to arrival for surgery 1 capsule 0   Esketamine HCl, 84 MG Dose, (SPRAVATO , 84 MG DOSE,) 28 MG/DEVICE SOPK Dispense X 3 (28 mg) devices for treatments every 7 days,  administer intranasally 3 each 3   esomeprazole (NEXIUM) 20 MG capsule Take 20 mg by mouth once.     fluocinonide  ointment (LIDEX ) 0.05 % Apply as directed to skin twice a day 30 g 0   gabapentin  (NEURONTIN ) 300 MG capsule Take 1 capsule (300 mg total) by mouth 3 (three) times daily. Take first dose 1 hour before arrival for surgery. 21 capsule 0   hydrocortisone  2.5 % cream Apply a small amount to affected area twice daily for 1-2 weeks as directed. 20 g 0   ketoconazole  (NIZORAL ) 2 % cream Apply a small amount to affected area on left foot/toe once a day 30 g 0   Levomilnacipran  HCl ER (FETZIMA ) 120 MG CP24 Take 1 capsule by mouth daily. 90 capsule 3   LORazepam (ATIVAN) 0.5 MG tablet Take 0.5-1 tablets (0.25-0.5 mg total) by mouth every 8 (eight) hours as needed for anxiety. 30 tablet 0   meclizine  (ANTIVERT ) 25 MG tablet Take 1 tablet (25 mg total) by mouth as directed prior to treatment. 60 tablet 0   metFORMIN  (GLUCOPHAGE ) 500 MG tablet Take 1 tablet (500 mg total) by mouth daily. 90 tablet 1   metFORMIN  (GLUCOPHAGE ) 500 MG tablet Take 1 tablet (500 mg total) by mouth 2 (two) times daily. 180 tablet 1   metFORMIN  (GLUCOPHAGE ) 500 MG tablet Take 1 tablet (500 mg total) by mouth 2 (two) times daily. 90 tablet 1   metFORMIN  (GLUCOPHAGE -XR) 500 MG 24 hr tablet Take 1 tablet (500 mg total) by mouth daily. Please do not fill if not picked up within 30 days. 30 tablet 0   methocarbamol  (ROBAXIN ) 500 MG tablet Take 1 tablet (500 mg total) by mouth every 6 (six) hours as needed for muscle spasms. (Patient not taking: Reported on 10/29/2023) 45 tablet 0   Norethindrone-Ethinyl Estradiol -Fe Biphas (LO LOESTRIN FE ) 1 MG-10 MCG / 10 MCG tablet Take 1 tablet by mouth daily. 28 tablet 4   ondansetron  (ZOFRAN ) 4 MG tablet Take 1 tablet  by mouth every 6 hours as needed for nausea 15 tablet 0   ondansetron  (ZOFRAN ) 4 MG tablet Take 1 tablet (4 mg total) by mouth every 6 (six) hours for nausea. TAKE 4 tablets 15  minutes prior to arrival for surgery 30 tablet 0   ondansetron  (ZOFRAN -ODT) 4 MG disintegrating tablet Dissolve 1 tablet (4 mg total) by mouth every 8 (eight) hours as needed for nausea or vomiting. 20 tablet 1   oxyCODONE  (OXY IR/ROXICODONE ) 5 MG immediate release tablet Take 1-2 tablet by mouth every six to eight hours as needed for Post Op Pain. DO NOT EXCEED  6 tablets in 24 hours. 30 tablet 0   polyethylene glycol (MIRALAX / GLYCOLAX) packet Take 17 g by mouth daily.     polyethylene glycol-electrolytes (NULYTELY) 420 g solution Take 4,000 mLs by mouth Nightly. 4000 mL 0   Semaglutide -Weight Management (WEGOVY ) 2.4 MG/0.75ML SOAJ Inject 2.4 mg into the skin once a week for weight loss. 3 mL 0   sulfamethoxazole -trimethoprim  (BACTRIM  DS) 800-160 MG tablet Take 1 tablet by mouth 2 (two) times daily as directed. Start the day after surgery. 10 tablet 0   SUMAtriptan  (IMITREX ) 50 MG tablet TAKE 1 TABLET  BY MOUTH AT ONSET OF HEADACHE, MAY REPEAT 1 DOSE AFTER 2 HOURS IF NEEDED 10 tablet 5   SUMAtriptan  (IMITREX ) 50 MG tablet Take 1 tablet by mouth at onset of headache, may repeat same dose once 2 hours later if needed 10 tablet 5   SUMAtriptan  (IMITREX ) 50 MG tablet Take 1 tablet (50 mg total) by mouth at the onset of headache. May repeat 1 dose after 2 hours if needed. 10 tablet 5   SUMAtriptan  (IMITREX ) 50 MG tablet Take 1 tablet (50 mg total) by mouth at onset of headache,may repeat 1 dose after 2 hours if needed, 2 to 3 times a week 9 tablet 6   SUMAtriptan  (IMITREX ) 50 MG tablet Take 1 tablet (50 mg total) by mouth as needed at onset of headache. May repeat 1 dose after 2 hours if needed. Only take 2-3 times a week. 9 tablet 6   terbinafine  (LAMISIL ) 250 MG tablet Take 1 tablet (250 mg total) by mouth daily for Tinea 14 tablet 0   traZODone  (DESYREL ) 50 MG tablet Take 50 mg by mouth at bedtime.     traZODone  (DESYREL ) 50 MG tablet TAKE 1 TO 2 TABLETS BY MOUTH AT BEDTIME 180 tablet 1   traZODone   (DESYREL ) 50 MG tablet Take 1-2 tablets (50-100 mg total) by mouth at bedtime. 180 tablet 1   traZODone  (DESYREL ) 50 MG tablet Take 1-2 tablets (50-100 mg total) by mouth at bedtime. 180 tablet 1   WEGOVY  0.5 MG/0.5ML SOAJ Inject 0.5 mg into the skin once a week for weight loss. 2 mL 0   WEGOVY  1 MG/0.5ML SOAJ Inject 1 mg into the skin once a week for weight loss 2 mL 0   WEGOVY  2.4 MG/0.75ML SOAJ Inject 2.4 mg into the skin once a week for weight loss. 3 mL 0   WEGOVY  2.4 MG/0.75ML SOAJ Inject 2.4 mg into the skin once a week fro weight loss 3 mL 0   WEGOVY  2.4 MG/0.75ML SOAJ Inject 2.4 mg into the skin once a week for weight loss 3 mL 0   WEGOVY  2.4 MG/0.75ML SOAJ Inject 2.4 mg,sub-q, into the skin once a week for weight loss 3 mL 0   WEGOVY  2.4 MG/0.75ML SOAJ Inject 2.4 mg into the skin once a week for weight loss. 3 mL 0   WEGOVY  2.4 MG/0.75ML SOAJ Inject 2.4 mg into the skin once a week for weight loss. 3 mL 0   WEGOVY  2.4 MG/0.75ML SOAJ Inject 2.4 mg into the skin once a week for weight loss 3 mL 0   No current facility-administered medications for this visit.    Medication Side Effects: None  Allergies:  Allergies  Allergen Reactions   Cat Dander Anaphylaxis   Brewers Yeast Rash    Past Medical History:  Diagnosis Date   Depression    takes Welbutrin and Fetzima    GERD (gastroesophageal reflux disease)    takes nexium    Past Medical History, Surgical history, Social history, and Family history were reviewed and updated as appropriate.   Please see review of systems for further details on the patient's review from today.   Objective:   Physical Exam:  There were no vitals taken for this visit.  Physical Exam Constitutional:      General: She is not in acute distress. Musculoskeletal:        General: No deformity.  Neurological:     Mental Status: She is alert and oriented to person, place, and time.  Coordination: Coordination normal.  Psychiatric:         Attention and Perception: Attention and perception normal. She does not perceive auditory or visual hallucinations.        Mood and Affect: Mood normal. Mood is not anxious or depressed. Affect is not labile, blunt, angry or inappropriate.        Speech: Speech normal.        Behavior: Behavior normal.        Thought Content: Thought content normal. Thought content is not paranoid or delusional. Thought content does not include homicidal or suicidal ideation. Thought content does not include homicidal or suicidal plan.        Cognition and Memory: Cognition and memory normal.        Judgment: Judgment normal.     Comments: Insight intact     Lab Review:     Component Value Date/Time   NA 140 09/01/2016 0852   K 4.4 09/01/2016 0852   CL 104 09/01/2016 0852   CO2 31 09/01/2016 0852   GLUCOSE 90 09/01/2016 0852   BUN 14 09/01/2016 0852   CREATININE 0.86 09/01/2016 0852   CALCIUM  9.2 09/01/2016 0852   PROT 7.0 09/01/2016 0852   ALBUMIN 4.3 09/01/2016 0852   AST 33 09/01/2016 0852   ALT 70 (H) 09/01/2016 0852   ALKPHOS 66 09/01/2016 0852   BILITOT 0.7 09/01/2016 0852       Component Value Date/Time   WBC 5.1 09/01/2016 0852   RBC 4.30 09/01/2016 0852   HGB 13.5 09/01/2016 0852   HCT 38.9 09/01/2016 0852   PLT 286.0 09/01/2016 0852   MCV 90.4 09/01/2016 0852   MCHC 34.6 09/01/2016 0852   RDW 11.9 09/01/2016 0852   LYMPHSABS 1.5 09/01/2016 0852   MONOABS 0.4 09/01/2016 0852   EOSABS 0.1 09/01/2016 0852   BASOSABS 0.0 09/01/2016 0852    No results found for: POCLITH, LITHIUM   No results found for: PHENYTOIN, PHENOBARB, VALPROATE, CBMZ   .res Assessment: Plan:    RECOMMENDATIONS:    PDMP reviewed.  Continue Wellbutrin  XL 150 mg daily. Continue Vraylar  1.5 mg, 1 p.o. daily. Continue Spravato  weekly. Continue Fetzima  120 mg, 1 p.o. daily. Continue Ativan  0.5 mg, 1/2-1 p.o. every 8 hours as needed anxiety. Continue trazodone  50 mg, 1-2 nightly as needed  sleep.  Return in 1 week.  I provided approximately 40 minutes of face to face time during this encounter, including time spent before and after the visit in records review, medical decision making, counseling pertinent to today's visit, and charting. [name] continues to respond well to the Spravato .   There are no diagnoses linked to this encounter.   Please see After Visit Summary for patient specific instructions.  Future Appointments  Date Time Provider Department Center  07/15/2024 10:00 AM Rhys Verneita DASEN, PA-C [facility] None  07/15/2024 10:00 AM CP-NURSE [facility] None    No orders of the defined types were placed in this encounter.   -------------------------------

## 2024-07-10 NOTE — Progress Notes (Signed)
 NURSES NOTE:   Pt arrived for her # 85 Spravato  Treatment for treatment resistant depression, the starting dose was 56 mg (2 of the 28 mg nasal sprays) pt stayed at that dose for the first 4 treatments and also takes Zofran  and Meclizine  prior to arrival at office. Pt received 84 mg (3 of the 28 mg nasal sprays) total again today and did also premedicate. Pt is now using her medical benefits and receives Spravato  through [place] and [facility], today is the first time using it. Pt sees Angeline Sayers, NP and she will follow her throughout her treatments and follow ups. Spravato  medication is stored at treatment center per REMS/FDA guidelines. The medication is required to be locked behind two doors per REMS/FDA protocol. Medication is also disposed of properly after each use per regulations. All documentation for REMS is completed and submitted per FDA/REMS requirements.          Pt directed to treatment room, started vitals at 10:20 AM, 144/92, pulse 72, SpO2 98%. Pt did take Zofran  4 mg and Meclizine  25 mg prior to arriving at office, an hour ahead. Pt did report she took cold medicine prior to her apt and it may elevate her B/P. Instructed patient to blow her nose if needed then recline back to a 45 degree angle if that was more comfortable for her. Gave patient first dose 28 mg nasal spray, administered in each nostril as directed and observed by nurse, waited 5 more minutes for the second and third doses. After all doses given pt did not complain of nausea. pt keeps water and some mints to help with the taste of Spravato  it gives the metallic taste. Her 40 minute check was at 11:01 AM, vital signs were 166/100, pulse 76, SpO2 99%, 164/100. Angeline is aware of elevated B/P's. Pt aware she would be monitored for a total time of 120 minutes.  Discharge vitals were taken at 12:19 PM 178/104 P 72, SpO2 99%, 155/102. Pt met with Angeline discuss her treatment. Pt advised to relax the rest of the day. No driving, no intense  activities. Verbalized understanding. Pt reports no issues today. Nurse was with pt a total of 60 minutes for clinical assessment.  Pt instructed to call office with any problems or questions prior to next treatment. She is scheduled next Tuesday.      (84 mg) LOT [id number] EXP AUG 2028

## 2024-07-14 DIAGNOSIS — H16223 Keratoconjunctivitis sicca, not specified as Sjogren's, bilateral: Secondary | ICD-10-CM | POA: Diagnosis not present

## 2024-07-15 ENCOUNTER — Encounter: Payer: Self-pay | Admitting: Physician Assistant

## 2024-07-15 ENCOUNTER — Ambulatory Visit: Admitting: Physician Assistant

## 2024-07-15 ENCOUNTER — Ambulatory Visit

## 2024-07-15 VITALS — BP 168/98 | HR 72

## 2024-07-15 DIAGNOSIS — F339 Major depressive disorder, recurrent, unspecified: Secondary | ICD-10-CM | POA: Diagnosis not present

## 2024-07-15 DIAGNOSIS — F411 Generalized anxiety disorder: Secondary | ICD-10-CM

## 2024-07-15 DIAGNOSIS — R030 Elevated blood-pressure reading, without diagnosis of hypertension: Secondary | ICD-10-CM

## 2024-07-15 NOTE — Progress Notes (Signed)
 [facility]  Patient ID: Robin Stevenson,  MRN: [id number]  PCP: Signa Rush, MD (Inactive)  Date of Evaluation: 07/15/2024 Time spent:60 minutes  Chief Complaint:  Chief Complaint   Depression; Other    HISTORY/CURRENT STATUS: HPI for Spravato  treatment, she sees Angeline Sayers, NP who is out of the office today.  Feels pretty good. The Spravato  is still helping with sx of depression.  Energy and motivation are good most of the time.  Is stressed from circumstances with aging parents. No PA but gets overwhelmed.   Ativan  has made her sleepy.  No extreme sadness, tearfulness, or feelings of hopelessness.  Sleeps well most of the time.  ADLs and personal hygiene are normal.   No change in memory.   Appetite has not changed.  Weight is stable.   No mania, delirium, AH/VH.  No SI/HI.  Individual Medical History/ Review of Systems: Changes? :No   Past medications for mental health diagnoses include: Spravato , Xanax , Wellbutrin , Vraylar , gabapentin , Fetzima , trazodone   Allergies: Cat dander and Brewers yeast  Current Medications:  Current Outpatient Medications:    atorvastatin  (LIPITOR) 20 MG tablet, Take 1 tablet (20 mg total) by mouth daily., Disp: 90 tablet, Rfl: 3   buPROPion  (WELLBUTRIN  XL) 150 MG 24 hr tablet, Take 1 tablet (150 mg total) by mouth every morning., Disp: 30 tablet, Rfl: 2   cariprazine  (VRAYLAR ) 1.5 MG capsule, Take 1 capsule (1.5 mg total) by mouth daily., Disp: 28 capsule, Rfl: 0   Esketamine HCl, 84 MG Dose, (SPRAVATO , 84 MG DOSE,) 28 MG/DEVICE SOPK, Dispense X 3 (28 mg) devices for treatments every 7 days, administer intranasally, Disp: 3 each, Rfl: 3   esomeprazole (NEXIUM) 20 MG capsule, Take 20 mg by mouth once., Disp: , Rfl:    Levomilnacipran  HCl ER (FETZIMA ) 120 MG CP24, Take 1 capsule by mouth daily., Disp: 90 capsule, Rfl: 3   LORazepam  (ATIVAN ) 0.5 MG tablet, Take 0.5-1 tablets (0.25-0.5 mg total) by mouth every 8 (eight) hours as  needed for anxiety., Disp: 30 tablet, Rfl: 0   metFORMIN  (GLUCOPHAGE ) 500 MG tablet, Take 1 tablet (500 mg total) by mouth daily., Disp: 90 tablet, Rfl: 1   ondansetron  (ZOFRAN ) 4 MG tablet, Take 1 tablet  by mouth every 6 hours as needed for nausea, Disp: 15 tablet, Rfl: 0   SUMAtriptan  (IMITREX ) 50 MG tablet, Take 1 tablet (50 mg total) by mouth at onset of headache,may repeat 1 dose after 2 hours if needed, 2 to 3 times a week, Disp: 9 tablet, Rfl: 6   traZODone  (DESYREL ) 50 MG tablet, Take 50 mg by mouth at bedtime., Disp: , Rfl:    atovaquone -proguanil (MALARONE ) 250-100 MG TABS tablet, Take 1 tablet by mouth once daily begin 2 days prearrival,take daily during stay & continue for 7 days posttravel for malaria prevention, Disp: 24 tablet, Rfl: 0   azithromycin  (ZITHROMAX ) 500 MG tablet, For non bloody diarrhea,take 2 tabs on day 1, if resolved,stop med,If diarrhea persists 1 tab on day 2 & 3. For bloody diarrhea,2 tabs on day 1 & 1 tab on day 2 & 3, Disp: 4 tablet, Rfl: 0   celecoxib  (CELEBREX ) 200 MG capsule, Take 1 capsule by mouth single dose as directed. Take 1 hour prior to arrival for surgery, Disp: 1 capsule, Rfl: 0   fluocinonide  ointment (LIDEX ) 0.05 %, Apply as directed to skin twice a day, Disp: 30 g, Rfl: 0   gabapentin  (NEURONTIN ) 300 MG capsule, Take 1 capsule (300 mg  total) by mouth 3 (three) times daily. Take first dose 1 hour before arrival for surgery., Disp: 21 capsule, Rfl: 0   hydrocortisone  2.5 % cream, Apply a small amount to affected area twice daily for 1-2 weeks as directed., Disp: 20 g, Rfl: 0   ketoconazole  (NIZORAL ) 2 % cream, Apply a small amount to affected area on left foot/toe once a day, Disp: 30 g, Rfl: 0   meclizine  (ANTIVERT ) 25 MG tablet, Take 1 tablet (25 mg total) by mouth as directed prior to treatment., Disp: 60 tablet, Rfl: 0   metFORMIN  (GLUCOPHAGE ) 500 MG tablet, Take 1 tablet (500 mg total) by mouth 2 (two) times daily., Disp: 180 tablet, Rfl: 1    metFORMIN  (GLUCOPHAGE ) 500 MG tablet, Take 1 tablet (500 mg total) by mouth 2 (two) times daily., Disp: 90 tablet, Rfl: 1   metFORMIN  (GLUCOPHAGE -XR) 500 MG 24 hr tablet, Take 1 tablet (500 mg total) by mouth daily. Please do not fill if not picked up within 30 days., Disp: 30 tablet, Rfl: 0   methocarbamol  (ROBAXIN ) 500 MG tablet, Take 1 tablet (500 mg total) by mouth every 6 (six) hours as needed for muscle spasms. (Patient not taking: Reported on 10/29/2023), Disp: 45 tablet, Rfl: 0   Norethindrone-Ethinyl Estradiol -Fe Biphas (LO LOESTRIN FE ) 1 MG-10 MCG / 10 MCG tablet, Take 1 tablet by mouth daily., Disp: 28 tablet, Rfl: 4   ondansetron  (ZOFRAN ) 4 MG tablet, Take 1 tablet (4 mg total) by mouth every 6 (six) hours for nausea. TAKE 4 tablets 15 minutes prior to arrival for surgery, Disp: 30 tablet, Rfl: 0   ondansetron  (ZOFRAN -ODT) 4 MG disintegrating tablet, Dissolve 1 tablet (4 mg total) by mouth every 8 (eight) hours as needed for nausea or vomiting., Disp: 20 tablet, Rfl: 1   oxyCODONE  (OXY IR/ROXICODONE ) 5 MG immediate release tablet, Take 1-2 tablet by mouth every six to eight hours as needed for Post Op Pain. DO NOT EXCEED  6 tablets in 24 hours., Disp: 30 tablet, Rfl: 0   polyethylene glycol (MIRALAX / GLYCOLAX) packet, Take 17 g by mouth daily., Disp: , Rfl:    polyethylene glycol-electrolytes (NULYTELY) 420 g solution, Take 4,000 mLs by mouth Nightly., Disp: 4000 mL, Rfl: 0   Semaglutide -Weight Management (WEGOVY ) 2.4 MG/0.75ML SOAJ, Inject 2.4 mg into the skin once a week for weight loss., Disp: 3 mL, Rfl: 0   sulfamethoxazole -trimethoprim  (BACTRIM  DS) 800-160 MG tablet, Take 1 tablet by mouth 2 (two) times daily as directed. Start the day after surgery., Disp: 10 tablet, Rfl: 0   SUMAtriptan  (IMITREX ) 50 MG tablet, TAKE 1 TABLET BY MOUTH AT ONSET OF HEADACHE, MAY REPEAT 1 DOSE AFTER 2 HOURS IF NEEDED, Disp: 10 tablet, Rfl: 5   SUMAtriptan  (IMITREX ) 50 MG tablet, Take 1 tablet by mouth at  onset of headache, may repeat same dose once 2 hours later if needed, Disp: 10 tablet, Rfl: 5   SUMAtriptan  (IMITREX ) 50 MG tablet, Take 1 tablet (50 mg total) by mouth at the onset of headache. May repeat 1 dose after 2 hours if needed., Disp: 10 tablet, Rfl: 5   SUMAtriptan  (IMITREX ) 50 MG tablet, Take 1 tablet (50 mg total) by mouth as needed at onset of headache. May repeat 1 dose after 2 hours if needed. Only take 2-3 times a week., Disp: 9 tablet, Rfl: 6   terbinafine  (LAMISIL ) 250 MG tablet, Take 1 tablet (250 mg total) by mouth daily for Tinea, Disp: 14 tablet, Rfl: 0  traZODone  (DESYREL ) 50 MG tablet, TAKE 1 TO 2 TABLETS BY MOUTH AT BEDTIME, Disp: 180 tablet, Rfl: 1   traZODone  (DESYREL ) 50 MG tablet, Take 1-2 tablets (50-100 mg total) by mouth at bedtime., Disp: 180 tablet, Rfl: 1   traZODone  (DESYREL ) 50 MG tablet, Take 1-2 tablets (50-100 mg total) by mouth at bedtime., Disp: 180 tablet, Rfl: 1   WEGOVY  0.5 MG/0.5ML SOAJ, Inject 0.5 mg into the skin once a week for weight loss., Disp: 2 mL, Rfl: 0   WEGOVY  1 MG/0.5ML SOAJ, Inject 1 mg into the skin once a week for weight loss, Disp: 2 mL, Rfl: 0   WEGOVY  2.4 MG/0.75ML SOAJ, Inject 2.4 mg into the skin once a week for weight loss., Disp: 3 mL, Rfl: 0   WEGOVY  2.4 MG/0.75ML SOAJ, Inject 2.4 mg into the skin once a week fro weight loss, Disp: 3 mL, Rfl: 0   WEGOVY  2.4 MG/0.75ML SOAJ, Inject 2.4 mg into the skin once a week for weight loss, Disp: 3 mL, Rfl: 0   WEGOVY  2.4 MG/0.75ML SOAJ, Inject 2.4 mg,sub-q, into the skin once a week for weight loss, Disp: 3 mL, Rfl: 0   WEGOVY  2.4 MG/0.75ML SOAJ, Inject 2.4 mg into the skin once a week for weight loss., Disp: 3 mL, Rfl: 0   WEGOVY  2.4 MG/0.75ML SOAJ, Inject 2.4 mg into the skin once a week for weight loss., Disp: 3 mL, Rfl: 0   WEGOVY  2.4 MG/0.75ML SOAJ, Inject 2.4 mg into the skin once a week for weight loss, Disp: 3 mL, Rfl: 0 Medication Side Effects: none  Family Medical/ Social  History: Changes?   No  MENTAL HEALTH EXAM:  There were no vitals taken for this visit.There is no height or weight on file to calculate BMI.  General Appearance: Casual and Well Groomed  Eye Contact:  Good  Speech:  Clear and Coherent and Normal Rate  Volume:  Normal  Mood:  Anxious  Affect:  Congruent  Thought Process:  Goal Directed and Descriptions of Associations: Circumstantial  Orientation:  Full (Time, Place, and Person)  Thought Content: Logical   Suicidal Thoughts:  No  Homicidal Thoughts:  No  Memory:  WNL  Judgement:  Good  Insight:  Good  Psychomotor Activity:  Normal  Concentration:  Concentration: Good and Attention Span: Good  Recall:  Good  Fund of Knowledge: Good  Language: Good  Assets:  Communication Skills Desire for Improvement Financial Resources/Insurance Housing Leisure Time Resilience Social Support Transportation Vocational/Educational  ADL's:  Intact  Cognition: WNL  Prognosis:  Good   ECT-MADRS    Flowsheet Row Clinical Support from 06/18/2024 in [facility] Video Visit from 03/20/2024 in [facility] Clinical Support from 01/23/2024 in [facility]  MADRS Total Score 3 20 18    She was administered Spravato  84 mg intranasally today.  The patient experienced the typical dissociation which gradually resolved over the 2-hour period of observation.  There were no complications.  Specifically the patient did not have nausea or vomiting or headache.  Blood pressure prior to Spravato  was 141/90, pulse 73, at the 40-minute mark it was 168/98 and pulse 72, at 2 hours after Spravato  it was 146/92 with pulse 72.  Pulse ox levels were normal.  She often has elevated blood pressure while in the office, she checks it at home and it is always normal.  By the time the 2-hour observation period was met the patient was  alert and oriented and able to exit without assistance.  Patient  feels the Spravato  administration is helpful for the treatment resistant depression and would like to continue the treatment.  See nursing note for further details.  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 Generalized anxiety disorder  F41.1     3. Elevated blood pressure reading  R03.0      Receiving Psychotherapy: No   RECOMMENDATIONS:   PDMP reviewed.  Spravato  filled 12/31/2023. Ativan  filled 06/18/2024.  I provided approximately 60 minutes of face to face time during this encounter, including time spent before and after the visit in records review, medical decision making, counseling pertinent to today's visit, and charting.   We discussed the elevated blood pressure.  It has been elevated more often than not lately.  She is under a lot of stress which could play a role, however we prefer much more normal blood pressures when receiving Spravato .  I have discussed this with her provider, Angeline Sayers, NP, and we agree to order clonidine 0.1 mg prior to her next Spravato  treatment, unless of course her blood pressure is 110/60 or less.  [name] Scroggins, LPN is aware of this order.     No other changes will be made.  Continue Wellbutrin  XL 150 mg daily. Continue Vraylar  1.5 mg, 1 p.o. daily. Start clonidine 0.1 mg, 1 p.o. prior to Spravato  treatment each week. Continue Spravato  weekly. Continue Fetzima  120 mg, 1 p.o. daily. Continue Ativan  0.5 mg, 1/2-1 p.o. every 8 hours as needed anxiety. Continue trazodone  50 mg, 1-2 nightly as needed sleep. Return in 1 week.  Verneita Cooks, PA-C

## 2024-07-16 NOTE — Progress Notes (Signed)
 NURSES NOTE:   Pt arrived for her # 53 Spravato  Treatment for treatment resistant depression, the starting dose was 56 mg (2 of the 28 mg nasal sprays) pt stayed at that dose for the first 4 treatments and also takes Zofran  and Meclizine  prior to arrival at office. Pt received 84 mg (3 of the 28 mg nasal sprays) total again today and did also premedicate. Pt is now using her medical benefits and receives Spravato  through [place] and [facility], today is the first time using it. Pt sees Angeline Sayers, NP and she will follow her throughout her treatments and follow ups. Spravato  medication is stored at treatment center per REMS/FDA guidelines. The medication is required to be locked behind two doors per REMS/FDA protocol. Medication is also disposed of properly after each use per regulations. All documentation for REMS is completed and submitted per FDA/REMS requirements.          Pt directed to treatment room, started vitals at 10:10 AM, 141/90, pulse 73, SpO2 97%. Pt did take Zofran  4 mg and Meclizine  25 mg prior to arriving at office, an hour ahead. Pt did report she took cold medicine prior to her apt and it may elevate her B/P. Instructed patient to blow her nose if needed then recline back to a 45 degree angle if that was more comfortable for her. Gave patient first dose 28 mg nasal spray, administered in each nostril as directed and observed by nurse, waited 5 more minutes for the second and third doses. After all doses given pt did not complain of nausea. pt keeps water and some mints to help with the taste of Spravato  it gives the metallic taste. Her 40 minute check was at 10:50 AM, vital signs were 168/98, pulse 72, SpO2 99%. Pt aware she would be monitored for a total time of 120 minutes.  Discharge vitals were taken at 12:09 PM 146/92 P 72, SpO2 99%. Pt met with Verneita Cooks, PA-C discuss her treatment and her continued elevated blood pressure readings.  Pt advised to relax the rest of the day. No driving,  no intense activities. Verbalized understanding. Pt reports no issues today. Nurse was with pt a total of 60 minutes for clinical assessment.  Pt instructed to call office with any problems or questions prior to next treatment. She is scheduled next Tuesday.      (84 mg) LOT [id number] EXP AUG 2028

## 2024-07-17 ENCOUNTER — Other Ambulatory Visit (HOSPITAL_COMMUNITY): Payer: Self-pay

## 2024-07-18 ENCOUNTER — Other Ambulatory Visit: Payer: Self-pay | Admitting: Adult Health

## 2024-07-18 ENCOUNTER — Other Ambulatory Visit (HOSPITAL_COMMUNITY): Payer: Self-pay

## 2024-07-18 DIAGNOSIS — F331 Major depressive disorder, recurrent, moderate: Secondary | ICD-10-CM

## 2024-07-19 MED ORDER — BUPROPION HCL ER (XL) 150 MG PO TB24
150.0000 mg | ORAL_TABLET | ORAL | 0 refills | Status: AC
  Filled 2024-07-19 – 2024-07-23 (×2): qty 90, 90d supply, fill #0

## 2024-07-20 ENCOUNTER — Other Ambulatory Visit (HOSPITAL_COMMUNITY): Payer: Self-pay

## 2024-07-21 ENCOUNTER — Other Ambulatory Visit (HOSPITAL_COMMUNITY): Payer: Self-pay

## 2024-07-21 MED ORDER — LO LOESTRIN FE 1 MG-10 MCG / 10 MCG PO TABS
1.0000 | ORAL_TABLET | Freq: Every day | ORAL | 2 refills | Status: AC
  Filled 2024-07-21: qty 84, 84d supply, fill #0

## 2024-07-21 MED ORDER — ATORVASTATIN CALCIUM 20 MG PO TABS
20.0000 mg | ORAL_TABLET | Freq: Every day | ORAL | 3 refills | Status: AC
  Filled 2024-07-21: qty 90, 90d supply, fill #0

## 2024-07-22 ENCOUNTER — Ambulatory Visit

## 2024-07-22 ENCOUNTER — Encounter: Admitting: Physician Assistant

## 2024-07-22 ENCOUNTER — Ambulatory Visit: Admitting: Physician Assistant

## 2024-07-22 ENCOUNTER — Encounter: Payer: Self-pay | Admitting: Physician Assistant

## 2024-07-22 ENCOUNTER — Encounter

## 2024-07-22 VITALS — BP 149/84 | HR 84

## 2024-07-22 DIAGNOSIS — R030 Elevated blood-pressure reading, without diagnosis of hypertension: Secondary | ICD-10-CM

## 2024-07-22 DIAGNOSIS — F411 Generalized anxiety disorder: Secondary | ICD-10-CM

## 2024-07-22 DIAGNOSIS — F339 Major depressive disorder, recurrent, unspecified: Secondary | ICD-10-CM

## 2024-07-22 NOTE — Progress Notes (Signed)
 NURSES NOTE:   Pt arrived for her # 60 Spravato  Treatment for treatment resistant depression, the starting dose was 56 mg (2 of the 28 mg nasal sprays) pt stayed at that dose for the first 4 treatments and also takes Zofran  and Meclizine  prior to arrival at office. Pt received 84 mg (3 of the 28 mg nasal sprays) total again today and did also premedicate. Pt is now using her medical benefits and receives Spravato  through [place] and [facility], today is the first time using it. Pt sees Angeline Sayers, NP and she will follow her throughout her treatments and follow ups. Spravato  medication is stored at treatment center per REMS/FDA guidelines. The medication is required to be locked behind two doors per REMS/FDA protocol. Medication is also disposed of properly after each use per regulations. All documentation for REMS is completed and submitted per FDA/REMS requirements.          Pt directed to treatment room, started vitals at 2:14 PM, 154/96, pulse 79, SpO2 97%. Pt given clonidine 0.1 mg after discussion with Angeline and Verneita due to pt's elevated B/P's recently. Pt did take Zofran  4 mg and Meclizine  25 mg prior to arriving at office, an hour ahead. Instructed patient to blow her nose if needed then recline back to a 45 degree angle if that was more comfortable for her. Gave patient first dose 28 mg nasal spray, administered in each nostril as directed and observed by nurse, waited 5 more minutes for the second and third doses. After all doses given pt did not complain of nausea. pt keeps water and some mints to help with the taste of Spravato  it gives the metallic taste. Her 40 minute check was at 2:55 PM, vital signs were 141/90, pulse 86, SpO2 98%. Pt aware she would be monitored for a total time of 120 minutes.  Discharge vitals were taken at 4:03 PM 149/84 P 84, SpO2 98%. Pt met with Verneita Cooks, PA-C discuss her treatment and her blood pressure readings with the clonidine did not come down much but did not go  up.  Pt advised to relax the rest of the day. No driving, no intense activities. Verbalized understanding. Pt reports no issues today. Nurse was with pt a total of 60 minutes for clinical assessment.  Pt instructed to call office with any problems or questions prior to next treatment. She is scheduled on December 29th.      (84 mg) LOT [id number] EXP AUG 2028

## 2024-07-22 NOTE — Progress Notes (Unsigned)
 [facility]  Patient ID: Robin Stevenson,  MRN: [id number]  PCP: Robin Rush, MD (Inactive)  Date of Evaluation: 07/22/2024 Time spent:65 minutes  Chief Complaint:  Chief Complaint   Depression; Other     HISTORY/CURRENT STATUS: HPI for Spravato  treatment, she sees Robin Sayers, NP who is out of the office today.  [name] is doing well.  The weekly Spravato  is still effective.  Energy and motivation are good.  She is leaving this Friday to go to [place] for a few weeks and is excited about that.  Appetite is normal and weight is stable.  ADLs and personal hygiene are normal.  No reported sadness.  Anxiety is well-controlled.  No mania, delirium, psychosis, SI/HI.  Individual Medical History/ Review of Systems: Changes? :No   Past medications for mental health diagnoses include: Spravato , Xanax , Wellbutrin , Vraylar , gabapentin , Fetzima , trazodone   Allergies: Cat dander and Brewers yeast  Current Medications:  Current Outpatient Medications:    atorvastatin  (LIPITOR) 20 MG tablet, Take 1 tablet (20 mg total) by mouth daily., Disp: 90 tablet, Rfl: 3   buPROPion  (WELLBUTRIN  XL) 150 MG 24 hr tablet, Take 1 tablet (150 mg total) by mouth every morning., Disp: 90 tablet, Rfl: 0   cariprazine  (VRAYLAR ) 1.5 MG capsule, Take 1 capsule (1.5 mg total) by mouth daily., Disp: 28 capsule, Rfl: 0   celecoxib  (CELEBREX ) 200 MG capsule, Take 1 capsule by mouth single dose as directed. Take 1 hour prior to arrival for surgery, Disp: 1 capsule, Rfl: 0   cloNIDine (CATAPRES) 0.1 MG tablet, Take 0.1 mg by mouth once., Disp: , Rfl:    Esketamine HCl, 84 MG Dose, (SPRAVATO , 84 MG DOSE,) 28 MG/DEVICE SOPK, Dispense X 3 (28 mg) devices for treatments every 7 days, administer intranasally, Disp: 3 each, Rfl: 3   esomeprazole (NEXIUM) 20 MG capsule, Take 20 mg by mouth once., Disp: , Rfl:    gabapentin  (NEURONTIN ) 300 MG capsule, Take 1 capsule (300 mg total) by mouth 3 (three) times daily.  Take first dose 1 hour before arrival for surgery., Disp: 21 capsule, Rfl: 0   Levomilnacipran  HCl ER (FETZIMA ) 120 MG CP24, Take 1 capsule by mouth daily., Disp: 90 capsule, Rfl: 3   LORazepam  (ATIVAN ) 0.5 MG tablet, Take 0.5-1 tablets (0.25-0.5 mg total) by mouth every 8 (eight) hours as needed for anxiety., Disp: 30 tablet, Rfl: 0   meclizine  (ANTIVERT ) 25 MG tablet, Take 1 tablet (25 mg total) by mouth as directed prior to treatment., Disp: 60 tablet, Rfl: 0   ondansetron  (ZOFRAN -ODT) 4 MG disintegrating tablet, Dissolve 1 tablet (4 mg total) by mouth every 8 (eight) hours as needed for nausea or vomiting., Disp: 20 tablet, Rfl: 1   SUMAtriptan  (IMITREX ) 50 MG tablet, Take 1 tablet (50 mg total) by mouth as needed at onset of headache. May repeat 1 dose after 2 hours if needed. Only take 2-3 times a week., Disp: 9 tablet, Rfl: 6   traZODone  (DESYREL ) 50 MG tablet, Take 50 mg by mouth at bedtime., Disp: , Rfl:    atovaquone -proguanil (MALARONE ) 250-100 MG TABS tablet, Take 1 tablet by mouth once daily begin 2 days prearrival,take daily during stay & continue for 7 days posttravel for malaria prevention, Disp: 24 tablet, Rfl: 0   azithromycin  (ZITHROMAX ) 500 MG tablet, For non bloody diarrhea,take 2 tabs on day 1, if resolved,stop med,If diarrhea persists 1 tab on day 2 & 3. For bloody diarrhea,2 tabs on day 1 & 1 tab on day 2 &  3, Disp: 4 tablet, Rfl: 0   fluocinonide  ointment (LIDEX ) 0.05 %, Apply as directed to skin twice a day, Disp: 30 g, Rfl: 0   hydrocortisone  2.5 % cream, Apply a small amount to affected area twice daily for 1-2 weeks as directed., Disp: 20 g, Rfl: 0   ketoconazole  (NIZORAL ) 2 % cream, Apply a small amount to affected area on left foot/toe once a day, Disp: 30 g, Rfl: 0   metFORMIN  (GLUCOPHAGE ) 500 MG tablet, Take 1 tablet (500 mg total) by mouth daily., Disp: 90 tablet, Rfl: 1   metFORMIN  (GLUCOPHAGE ) 500 MG tablet, Take 1 tablet (500 mg total) by mouth 2 (two) times daily.,  Disp: 180 tablet, Rfl: 1   metFORMIN  (GLUCOPHAGE ) 500 MG tablet, Take 1 tablet (500 mg total) by mouth 2 (two) times daily., Disp: 90 tablet, Rfl: 1   metFORMIN  (GLUCOPHAGE -XR) 500 MG 24 hr tablet, Take 1 tablet (500 mg total) by mouth daily. Please do not fill if not picked up within 30 days., Disp: 30 tablet, Rfl: 0   methocarbamol  (ROBAXIN ) 500 MG tablet, Take 1 tablet (500 mg total) by mouth every 6 (six) hours as needed for muscle spasms. (Patient not taking: Reported on 10/29/2023), Disp: 45 tablet, Rfl: 0   Norethindrone-Ethinyl Estradiol -Fe Biphas (LO LOESTRIN FE ) 1 MG-10 MCG / 10 MCG tablet, Take 1 tablet by mouth daily., Disp: 84 tablet, Rfl: 2   ondansetron  (ZOFRAN ) 4 MG tablet, Take 1 tablet  by mouth every 6 hours as needed for nausea, Disp: 15 tablet, Rfl: 0   ondansetron  (ZOFRAN ) 4 MG tablet, Take 1 tablet (4 mg total) by mouth every 6 (six) hours for nausea. TAKE 4 tablets 15 minutes prior to arrival for surgery, Disp: 30 tablet, Rfl: 0   oxyCODONE  (OXY IR/ROXICODONE ) 5 MG immediate release tablet, Take 1-2 tablet by mouth every six to eight hours as needed for Post Op Pain. DO NOT EXCEED  6 tablets in 24 hours., Disp: 30 tablet, Rfl: 0   polyethylene glycol (MIRALAX / GLYCOLAX) packet, Take 17 g by mouth daily., Disp: , Rfl:    polyethylene glycol-electrolytes (NULYTELY) 420 g solution, Take 4,000 mLs by mouth Nightly., Disp: 4000 mL, Rfl: 0   Semaglutide -Weight Management (WEGOVY ) 2.4 MG/0.75ML SOAJ, Inject 2.4 mg into the skin once a week for weight loss., Disp: 3 mL, Rfl: 0   sulfamethoxazole -trimethoprim  (BACTRIM  DS) 800-160 MG tablet, Take 1 tablet by mouth 2 (two) times daily as directed. Start the day after surgery., Disp: 10 tablet, Rfl: 0   SUMAtriptan  (IMITREX ) 50 MG tablet, TAKE 1 TABLET BY MOUTH AT ONSET OF HEADACHE, MAY REPEAT 1 DOSE AFTER 2 HOURS IF NEEDED, Disp: 10 tablet, Rfl: 5   SUMAtriptan  (IMITREX ) 50 MG tablet, Take 1 tablet by mouth at onset of headache, may repeat  same dose once 2 hours later if needed, Disp: 10 tablet, Rfl: 5   SUMAtriptan  (IMITREX ) 50 MG tablet, Take 1 tablet (50 mg total) by mouth at the onset of headache. May repeat 1 dose after 2 hours if needed., Disp: 10 tablet, Rfl: 5   SUMAtriptan  (IMITREX ) 50 MG tablet, Take 1 tablet (50 mg total) by mouth at onset of headache,may repeat 1 dose after 2 hours if needed, 2 to 3 times a week, Disp: 9 tablet, Rfl: 6   terbinafine  (LAMISIL ) 250 MG tablet, Take 1 tablet (250 mg total) by mouth daily for Tinea, Disp: 14 tablet, Rfl: 0   traZODone  (DESYREL ) 50 MG tablet, TAKE 1 TO  2 TABLETS BY MOUTH AT BEDTIME, Disp: 180 tablet, Rfl: 1   traZODone  (DESYREL ) 50 MG tablet, Take 1-2 tablets (50-100 mg total) by mouth at bedtime., Disp: 180 tablet, Rfl: 1   traZODone  (DESYREL ) 50 MG tablet, Take 1-2 tablets (50-100 mg total) by mouth at bedtime., Disp: 180 tablet, Rfl: 1   WEGOVY  0.5 MG/0.5ML SOAJ, Inject 0.5 mg into the skin once a week for weight loss., Disp: 2 mL, Rfl: 0   WEGOVY  1 MG/0.5ML SOAJ, Inject 1 mg into the skin once a week for weight loss, Disp: 2 mL, Rfl: 0   WEGOVY  2.4 MG/0.75ML SOAJ, Inject 2.4 mg into the skin once a week for weight loss., Disp: 3 mL, Rfl: 0   WEGOVY  2.4 MG/0.75ML SOAJ, Inject 2.4 mg into the skin once a week fro weight loss, Disp: 3 mL, Rfl: 0   WEGOVY  2.4 MG/0.75ML SOAJ, Inject 2.4 mg into the skin once a week for weight loss, Disp: 3 mL, Rfl: 0   WEGOVY  2.4 MG/0.75ML SOAJ, Inject 2.4 mg,sub-q, into the skin once a week for weight loss, Disp: 3 mL, Rfl: 0   WEGOVY  2.4 MG/0.75ML SOAJ, Inject 2.4 mg into the skin once a week for weight loss., Disp: 3 mL, Rfl: 0   WEGOVY  2.4 MG/0.75ML SOAJ, Inject 2.4 mg into the skin once a week for weight loss., Disp: 3 mL, Rfl: 0   WEGOVY  2.4 MG/0.75ML SOAJ, Inject 2.4 mg into the skin once a week for weight loss, Disp: 3 mL, Rfl: 0 Medication Side Effects: none  Family Medical/ Social History: Changes?   No  MENTAL HEALTH EXAM:  There  were no vitals taken for this visit.There is no height or weight on file to calculate BMI.  General Appearance: Casual and Well Groomed  Eye Contact:  Good  Speech:  Clear and Coherent and Normal Rate  Volume:  Normal  Mood:  Euthymic  Affect:  Congruent  Thought Process:  Goal Directed and Descriptions of Associations: Circumstantial  Orientation:  Full (Time, Place, and Person)  Thought Content: Logical   Suicidal Thoughts:  No  Homicidal Thoughts:  No  Memory:  WNL  Judgement:  Good  Insight:  Good  Psychomotor Activity:  Normal  Concentration:  Concentration: Good and Attention Span: Good  Recall:  Good  Fund of Knowledge: Good  Language: Good  Assets:  Communication Skills Desire for Improvement Financial Resources/Insurance Housing Leisure Time Resilience Social Support Transportation Vocational/Educational  ADL's:  Intact  Cognition: WNL  Prognosis:  Good   ECT-MADRS    Flowsheet Row Clinical Support from 06/18/2024 in [facility] Video Visit from 03/20/2024 in [facility] Clinical Support from 01/23/2024 in [facility]  MADRS Total Score 3 20 18    Patient was administered Spravato  84 mg intranasally today.  The patient experienced the typical dissociation which gradually resolved over the 2-hour period of observation.  There were no complications.  Specifically the patient did not have nausea or vomiting or headache.  She was given clonidine, 0.1 mg prior to the start of Spravato .  Her initial BP was 154/96, then at the 40-minute mark it was 141/90, and 2 hours after administration of Spravato  it was 149/84.  Even though her blood pressures remained borderline high, it did not spike after the treatment as it usually does.  By the time the 2-hour observation period was met the patient was alert and oriented and able to exit without assistance.  Patient feels the Spravato  administration is  helpful for the treatment resistant depression and would like to continue the treatment.  See nursing note for further details.  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 Generalized anxiety disorder  F41.1     3. Elevated blood pressure reading  R03.0      Receiving Psychotherapy: No   RECOMMENDATIONS:   PDMP reviewed.  Spravato  filled 12/31/2023. Ativan  filled 06/18/2024.  I provided approximately  65 minutes of face to face time during this encounter, including time spent before and after the visit in records review, medical decision making, counseling pertinent to today's visit, and charting.   She continues to respond to the current medication regimen so no changes are needed.  Continue Wellbutrin  XL 150 mg daily. Continue Vraylar  1.5 mg, 1 p.o. daily. Continue clonidine 0.1 mg, 1 p.o. prior to Spravato  treatment each week. Continue Spravato  weekly. Continue Fetzima  120 mg, 1 p.o. daily. Continue Ativan  0.5 mg, 1/2-1 p.o. every 8 hours as needed anxiety. Continue trazodone  50 mg, 1-2 nightly as needed sleep. Return in 3 weeks after she returns from her trip to [place].  Verneita Cooks, PA-C

## 2024-07-23 ENCOUNTER — Other Ambulatory Visit (HOSPITAL_COMMUNITY): Payer: Self-pay

## 2024-07-23 ENCOUNTER — Encounter: Payer: Self-pay | Admitting: Physician Assistant

## 2024-08-18 ENCOUNTER — Encounter: Payer: Self-pay | Admitting: Physician Assistant

## 2024-08-18 ENCOUNTER — Ambulatory Visit

## 2024-08-18 ENCOUNTER — Ambulatory Visit: Admitting: Physician Assistant

## 2024-08-18 VITALS — BP 153/96 | HR 70

## 2024-08-18 DIAGNOSIS — F339 Major depressive disorder, recurrent, unspecified: Secondary | ICD-10-CM

## 2024-08-18 DIAGNOSIS — F411 Generalized anxiety disorder: Secondary | ICD-10-CM

## 2024-08-18 DIAGNOSIS — F418 Other specified anxiety disorders: Secondary | ICD-10-CM

## 2024-08-18 NOTE — Progress Notes (Signed)
 NURSES NOTE:   Pt arrived for her # 61 Spravato  Treatment for treatment resistant depression, the starting dose was 56 mg (2 of the 28 mg nasal sprays) pt stayed at that dose for the first 4 treatments and also takes Zofran  and Meclizine  prior to arrival at office. Pt received 84 mg (3 of the 28 mg nasal sprays) total again today and did also premedicate. Pt is now using her medical benefits and receives Spravato  through [facility], today is the first time using it. Pt sees Angeline Sayers, NP and she will follow her throughout her treatments and follow ups. Spravato  medication is stored at treatment center per REMS/FDA guidelines. The medication is required to be locked behind two doors per REMS/FDA protocol. Medication is also disposed of properly after each use per regulations. All documentation for REMS is completed and submitted per FDA/REMS requirements.          Pt directed to treatment room, started vitals at 10:10 AM, 139/90, pulse 76, SpO2 98%. Pt did take Zofran  4 mg and Meclizine  25 mg prior to arriving at office, an hour ahead. Instructed patient to blow her nose if needed then recline back to a 45 degree angle if that was more comfortable for her. Gave patient first dose 28 mg nasal spray, administered in each nostril as directed and observed by nurse, waited 5 more minutes for the second and third doses. After all doses given pt did not complain of nausea. pt keeps water and some mints to help with the taste of Spravato  it gives the metallic taste. Her 40 minute check was at 10:50 AM, vital signs were 146/97, pulse 75, SpO2 98%. Pt aware she would be monitored for a total time of 120 minutes.  Discharge vitals were taken at 12:00 PM 153/96 P 70, SpO2 98%. Pt met with Verneita Cooks, PA-C discuss her treatment.  Pt advised to relax the rest of the day. No driving, no intense activities. Verbalized understanding. Pt reports no issues today. Nurse was with pt a total of 60 minutes for clinical  assessment.  Pt instructed to call office with any problems or questions prior to next treatment. She is scheduled next year on January 8th.      (84 mg) LOT [id number] EXP AUG 2028

## 2024-08-18 NOTE — Progress Notes (Signed)
 "     [facility]  Patient ID: Robin Stevenson,  MRN: [id number]  PCP: Signa Rush, MD (Inactive)  Date of Evaluation: 08/18/2024 Time spent:60 minutes  Chief Complaint:  Chief Complaint   Depression; Other    HISTORY/CURRENT STATUS: HPI for Spravato  treatment, she sees Angeline Sayers, NP who is out of the office today.  [name] is doing fine.  She just returned a few days ago from a 3-week trip to [place].  She really enjoyed it but is very glad to be home.  States she got a little sad the last week she was there. Not sure if it was related to the fact that she had not had the Spravato  treatment in a couple of weeks or just that she was ready to get back home. Her daughter is now back home after a 16-month study abroad program in [place].  [name] is really happy about that.  She still feels like the Spravato  is effective.  Energy and motivation are good except for the jet lag she feels at the present time.  No feelings of hopelessness.  Sleeps ok.  ADLs and personal hygiene are normal.  Appetite has not changed.  No increased anxiety.  No mania, delirium, AH/VH.  No SI/HI.  Individual Medical History/ Review of Systems: Changes? :No   Past medications for mental health diagnoses include: Spravato , Xanax , Wellbutrin , Vraylar , gabapentin , Fetzima , trazodone   Allergies: Cat dander and Brewers yeast  Current Medications:  Current Outpatient Medications:    atorvastatin  (LIPITOR) 20 MG tablet, Take 1 tablet (20 mg total) by mouth daily., Disp: 90 tablet, Rfl: 3   atovaquone -proguanil (MALARONE ) 250-100 MG TABS tablet, Take 1 tablet by mouth once daily begin 2 days prearrival,take daily during stay & continue for 7 days posttravel for malaria prevention, Disp: 24 tablet, Rfl: 0   azithromycin  (ZITHROMAX ) 500 MG tablet, For non bloody diarrhea,take 2 tabs on day 1, if resolved,stop med,If diarrhea persists 1 tab on day 2 & 3. For bloody diarrhea,2 tabs on day 1 & 1 tab on day 2 & 3,  Disp: 4 tablet, Rfl: 0   buPROPion  (WELLBUTRIN  XL) 150 MG 24 hr tablet, Take 1 tablet (150 mg total) by mouth every morning., Disp: 90 tablet, Rfl: 0   cariprazine  (VRAYLAR ) 1.5 MG capsule, Take 1 capsule (1.5 mg total) by mouth daily., Disp: 28 capsule, Rfl: 0   celecoxib  (CELEBREX ) 200 MG capsule, Take 1 capsule by mouth single dose as directed. Take 1 hour prior to arrival for surgery, Disp: 1 capsule, Rfl: 0   cloNIDine (CATAPRES) 0.1 MG tablet, Take 0.1 mg by mouth once., Disp: , Rfl:    Esketamine HCl, 84 MG Dose, (SPRAVATO , 84 MG DOSE,) 28 MG/DEVICE SOPK, Dispense X 3 (28 mg) devices for treatments every 7 days, administer intranasally, Disp: 3 each, Rfl: 3   esomeprazole (NEXIUM) 20 MG capsule, Take 20 mg by mouth once., Disp: , Rfl:    fluocinonide  ointment (LIDEX ) 0.05 %, Apply as directed to skin twice a day, Disp: 30 g, Rfl: 0   gabapentin  (NEURONTIN ) 300 MG capsule, Take 1 capsule (300 mg total) by mouth 3 (three) times daily. Take first dose 1 hour before arrival for surgery., Disp: 21 capsule, Rfl: 0   hydrocortisone  2.5 % cream, Apply a small amount to affected area twice daily for 1-2 weeks as directed., Disp: 20 g, Rfl: 0   ketoconazole  (NIZORAL ) 2 % cream, Apply a small amount to affected area on left foot/toe once  a day, Disp: 30 g, Rfl: 0   Levomilnacipran  HCl ER (FETZIMA ) 120 MG CP24, Take 1 capsule by mouth daily., Disp: 90 capsule, Rfl: 3   LORazepam  (ATIVAN ) 0.5 MG tablet, Take 0.5-1 tablets (0.25-0.5 mg total) by mouth every 8 (eight) hours as needed for anxiety., Disp: 30 tablet, Rfl: 0   meclizine  (ANTIVERT ) 25 MG tablet, Take 1 tablet (25 mg total) by mouth as directed prior to treatment., Disp: 60 tablet, Rfl: 0   metFORMIN  (GLUCOPHAGE ) 500 MG tablet, Take 1 tablet (500 mg total) by mouth daily., Disp: 90 tablet, Rfl: 1   metFORMIN  (GLUCOPHAGE ) 500 MG tablet, Take 1 tablet (500 mg total) by mouth 2 (two) times daily., Disp: 180 tablet, Rfl: 1   metFORMIN  (GLUCOPHAGE ) 500  MG tablet, Take 1 tablet (500 mg total) by mouth 2 (two) times daily., Disp: 90 tablet, Rfl: 1   metFORMIN  (GLUCOPHAGE -XR) 500 MG 24 hr tablet, Take 1 tablet (500 mg total) by mouth daily. Please do not fill if not picked up within 30 days., Disp: 30 tablet, Rfl: 0   methocarbamol  (ROBAXIN ) 500 MG tablet, Take 1 tablet (500 mg total) by mouth every 6 (six) hours as needed for muscle spasms. (Patient not taking: Reported on 10/29/2023), Disp: 45 tablet, Rfl: 0   Norethindrone-Ethinyl Estradiol -Fe Biphas (LO LOESTRIN FE ) 1 MG-10 MCG / 10 MCG tablet, Take 1 tablet by mouth daily., Disp: 84 tablet, Rfl: 2   ondansetron  (ZOFRAN ) 4 MG tablet, Take 1 tablet  by mouth every 6 hours as needed for nausea, Disp: 15 tablet, Rfl: 0   ondansetron  (ZOFRAN ) 4 MG tablet, Take 1 tablet (4 mg total) by mouth every 6 (six) hours for nausea. TAKE 4 tablets 15 minutes prior to arrival for surgery, Disp: 30 tablet, Rfl: 0   ondansetron  (ZOFRAN -ODT) 4 MG disintegrating tablet, Dissolve 1 tablet (4 mg total) by mouth every 8 (eight) hours as needed for nausea or vomiting., Disp: 20 tablet, Rfl: 1   oxyCODONE  (OXY IR/ROXICODONE ) 5 MG immediate release tablet, Take 1-2 tablet by mouth every six to eight hours as needed for Post Op Pain. DO NOT EXCEED  6 tablets in 24 hours., Disp: 30 tablet, Rfl: 0   polyethylene glycol (MIRALAX / GLYCOLAX) packet, Take 17 g by mouth daily., Disp: , Rfl:    polyethylene glycol-electrolytes (NULYTELY) 420 g solution, Take 4,000 mLs by mouth Nightly., Disp: 4000 mL, Rfl: 0   Semaglutide -Weight Management (WEGOVY ) 2.4 MG/0.75ML SOAJ, Inject 2.4 mg into the skin once a week for weight loss., Disp: 3 mL, Rfl: 0   sulfamethoxazole -trimethoprim  (BACTRIM  DS) 800-160 MG tablet, Take 1 tablet by mouth 2 (two) times daily as directed. Start the day after surgery., Disp: 10 tablet, Rfl: 0   SUMAtriptan  (IMITREX ) 50 MG tablet, TAKE 1 TABLET BY MOUTH AT ONSET OF HEADACHE, MAY REPEAT 1 DOSE AFTER 2 HOURS IF  NEEDED, Disp: 10 tablet, Rfl: 5   SUMAtriptan  (IMITREX ) 50 MG tablet, Take 1 tablet by mouth at onset of headache, may repeat same dose once 2 hours later if needed, Disp: 10 tablet, Rfl: 5   SUMAtriptan  (IMITREX ) 50 MG tablet, Take 1 tablet (50 mg total) by mouth at the onset of headache. May repeat 1 dose after 2 hours if needed., Disp: 10 tablet, Rfl: 5   SUMAtriptan  (IMITREX ) 50 MG tablet, Take 1 tablet (50 mg total) by mouth at onset of headache,may repeat 1 dose after 2 hours if needed, 2 to 3 times a week, Disp: 9 tablet,  Rfl: 6   SUMAtriptan  (IMITREX ) 50 MG tablet, Take 1 tablet (50 mg total) by mouth as needed at onset of headache. May repeat 1 dose after 2 hours if needed. Only take 2-3 times a week., Disp: 9 tablet, Rfl: 6   terbinafine  (LAMISIL ) 250 MG tablet, Take 1 tablet (250 mg total) by mouth daily for Tinea, Disp: 14 tablet, Rfl: 0   traZODone  (DESYREL ) 50 MG tablet, Take 50 mg by mouth at bedtime., Disp: , Rfl:    traZODone  (DESYREL ) 50 MG tablet, TAKE 1 TO 2 TABLETS BY MOUTH AT BEDTIME, Disp: 180 tablet, Rfl: 1   traZODone  (DESYREL ) 50 MG tablet, Take 1-2 tablets (50-100 mg total) by mouth at bedtime., Disp: 180 tablet, Rfl: 1   traZODone  (DESYREL ) 50 MG tablet, Take 1-2 tablets (50-100 mg total) by mouth at bedtime., Disp: 180 tablet, Rfl: 1   WEGOVY  0.5 MG/0.5ML SOAJ, Inject 0.5 mg into the skin once a week for weight loss., Disp: 2 mL, Rfl: 0   WEGOVY  1 MG/0.5ML SOAJ, Inject 1 mg into the skin once a week for weight loss, Disp: 2 mL, Rfl: 0   WEGOVY  2.4 MG/0.75ML SOAJ, Inject 2.4 mg into the skin once a week for weight loss., Disp: 3 mL, Rfl: 0   WEGOVY  2.4 MG/0.75ML SOAJ, Inject 2.4 mg into the skin once a week fro weight loss, Disp: 3 mL, Rfl: 0   WEGOVY  2.4 MG/0.75ML SOAJ, Inject 2.4 mg into the skin once a week for weight loss, Disp: 3 mL, Rfl: 0   WEGOVY  2.4 MG/0.75ML SOAJ, Inject 2.4 mg,sub-q, into the skin once a week for weight loss, Disp: 3 mL, Rfl: 0   WEGOVY  2.4  MG/0.75ML SOAJ, Inject 2.4 mg into the skin once a week for weight loss., Disp: 3 mL, Rfl: 0   WEGOVY  2.4 MG/0.75ML SOAJ, Inject 2.4 mg into the skin once a week for weight loss., Disp: 3 mL, Rfl: 0   WEGOVY  2.4 MG/0.75ML SOAJ, Inject 2.4 mg into the skin once a week for weight loss, Disp: 3 mL, Rfl: 0 Medication Side Effects: none  Family Medical/ Social History: Changes?   No  MENTAL HEALTH EXAM:  There were no vitals taken for this visit.There is no height or weight on file to calculate BMI.  General Appearance: Casual and Well Groomed  Eye Contact:  Good  Speech:  Clear and Coherent and Normal Rate  Volume:  Normal  Mood:  Euthymic  Affect:  Congruent  Thought Process:  Goal Directed and Descriptions of Associations: Circumstantial  Orientation:  Full (Time, Place, and Person)  Thought Content: Logical   Suicidal Thoughts:  No  Homicidal Thoughts:  No  Memory:  WNL  Judgement:  Good  Insight:  Good  Psychomotor Activity:  Normal  Concentration:  Concentration: Good and Attention Span: Good  Recall:  Good  Fund of Knowledge: Good  Language: Good  Assets:  Communication Skills Desire for Improvement Financial Resources/Insurance Housing Leisure Time Physical Health Resilience Social Support Transportation Vocational/Educational  ADL's:  Intact  Cognition: WNL  Prognosis:  Good   ECT-MADRS    Flowsheet Row Clinical Support from 06/18/2024 in [facility] Video Visit from 03/20/2024 in [facility] Clinical Support from 01/23/2024 in [facility]  MADRS Total Score 3 20 18    Patient was administered Spravato  84 mg intranasally today.  The patient experienced the typical dissociation which gradually resolved over the 2-hour period of observation.  There were no complications.  Specifically the patient did not have nausea or vomiting or headache.  Blood pressures remained within normal  ranges at the 40-minute and 2-hour follow-up intervals.  She did not require clonidine prior to this treatment.  By the time the 2-hour observation period was met the patient was alert and oriented and able to exit without assistance.  Patient feels the Spravato  administration is helpful for the treatment resistant depression and would like to continue the treatment.  See nursing note for further details.  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 Generalized anxiety disorder  F41.1     3. Situational anxiety  F41.8      Receiving Psychotherapy: No   RECOMMENDATIONS:   PDMP reviewed.  Spravato  filled 12/31/2023. Ativan  filled 06/18/2024.  I provided approximately  60 minutes of face to face time during this encounter, including time spent before and after the visit in records review, medical decision making, counseling pertinent to today's visit, and charting.   She continues to respond to the current medication regimen so no changes are needed.  Continue Wellbutrin  XL 150 mg daily. Continue Vraylar  1.5 mg, 1 p.o. daily. Continue clonidine 0.1 mg, 1 p.o. prior to Spravato  treatment each week per protocol prn. Continue Spravato  weekly. Continue Fetzima  120 mg, 1 p.o. daily. Continue Ativan  0.5 mg, 1/2-1 p.o. every 8 hours as needed anxiety. Continue trazodone  50 mg, 1-2 nightly as needed sleep. Return in 1 week.   Verneita Cooks, PA-C  "

## 2024-08-26 ENCOUNTER — Other Ambulatory Visit (HOSPITAL_COMMUNITY): Payer: Self-pay

## 2024-08-26 ENCOUNTER — Other Ambulatory Visit: Payer: Self-pay

## 2024-08-26 ENCOUNTER — Ambulatory Visit (INDEPENDENT_AMBULATORY_CARE_PROVIDER_SITE_OTHER): Admitting: Physician Assistant

## 2024-08-26 ENCOUNTER — Ambulatory Visit

## 2024-08-26 ENCOUNTER — Encounter: Payer: Self-pay | Admitting: Physician Assistant

## 2024-08-26 VITALS — BP 135/85 | HR 77

## 2024-08-26 DIAGNOSIS — F339 Major depressive disorder, recurrent, unspecified: Secondary | ICD-10-CM

## 2024-08-26 DIAGNOSIS — F411 Generalized anxiety disorder: Secondary | ICD-10-CM | POA: Diagnosis not present

## 2024-08-26 MED ORDER — MECLIZINE HCL 25 MG PO TABS
25.0000 mg | ORAL_TABLET | ORAL | 2 refills | Status: AC
  Filled 2024-08-26: qty 60, 60d supply, fill #0

## 2024-08-26 NOTE — Progress Notes (Signed)
 NURSES NOTE:   Pt arrived for her # 62 Spravato  Treatment for treatment resistant depression, the starting dose was 56 mg (2 of the 28 mg nasal sprays) pt stayed at that dose for the first 4 treatments and also takes Zofran  and Meclizine  prior to arrival at office. Pt received 84 mg (3 of the 28 mg nasal sprays) total again today and did also premedicate. Pt is now using her medical benefits and receives Spravato  through [facility], today is the first time using it. Pt sees Angeline Sayers, NP and she will follow her throughout her treatments and follow ups. Spravato  medication is stored at treatment center per REMS/FDA guidelines. The medication is required to be locked behind two doors per REMS/FDA protocol. Medication is also disposed of properly after each use per regulations. All documentation for REMS is completed and submitted per FDA/REMS requirements.          Pt directed to treatment room, started vitals at 1:50 PM, 138/82, pulse 77, SpO2 98%. Pt did take Zofran  4 mg and Meclizine  25 mg prior to arriving at office, an hour ahead. Instructed patient to blow her nose if needed then recline back to a 45 degree angle if that was more comfortable for her. Gave patient first dose 28 mg nasal spray, administered in each nostril as directed and observed by nurse, waited 5 more minutes for the second and third doses. After all doses given pt did not complain of nausea. pt keeps water and some mints to help with the taste of Spravato  it gives the metallic taste. Her 40 minute check was at 2:30 PM, vital signs were 147/90, pulse 74, SpO2 98%. Pt aware she would be monitored for a total time of 120 minutes.  Discharge vitals were taken at 3:45 PM 135/85 P 77, SpO2 98%. Pt met with Verneita Cooks, PA-C discuss her treatment.  Pt advised to relax the rest of the day. No driving, no intense activities. Verbalized understanding. Pt reports no issues today. Nurse was with pt a total of 60 minutes for clinical  assessment.  Pt instructed to call office with any problems or questions prior to next treatment. She is scheduled next Tuesday.      (84 mg) LOT [id number] EXP AUG 2028

## 2024-08-26 NOTE — Progress Notes (Unsigned)
 "     [facility]  Patient ID: Robin Stevenson,  MRN: [id number]  PCP: Signa Rush, MD (Inactive)  Date of Evaluation: 08/26/2024 Time spent:65 minutes  Chief Complaint:  Chief Complaint   Depression; Other    HISTORY/CURRENT STATUS: HPI for Spravato  treatment, she sees Angeline Sayers, NP who is out of the office today.  [name] is doing well.  Energy and motivation are good.   No extreme sadness, tearfulness, or feelings of hopelessness.  Sleeps well most of the time. ADLs and personal hygiene are normal.   Appetite has not changed.  Weight is stable.  Recent stressors include parents moving to Assisted Living next month. No PA.  No mania, delirium, AH/VH.  No SI/HI.  Individual Medical History/ Review of Systems: Changes? :No   Past medications for mental health diagnoses include: Spravato , Xanax , Wellbutrin , Vraylar , gabapentin , Fetzima , trazodone   Allergies: Cat dander and Brewers yeast  Current Medications:  Current Outpatient Medications:    atorvastatin  (LIPITOR) 20 MG tablet, Take 1 tablet (20 mg total) by mouth daily., Disp: 90 tablet, Rfl: 3   buPROPion  (WELLBUTRIN  XL) 150 MG 24 hr tablet, Take 1 tablet (150 mg total) by mouth every morning., Disp: 90 tablet, Rfl: 0   cariprazine  (VRAYLAR ) 1.5 MG capsule, Take 1 capsule (1.5 mg total) by mouth daily., Disp: 28 capsule, Rfl: 0   celecoxib  (CELEBREX ) 200 MG capsule, Take 1 capsule by mouth single dose as directed. Take 1 hour prior to arrival for surgery, Disp: 1 capsule, Rfl: 0   cloNIDine (CATAPRES) 0.1 MG tablet, Take 0.1 mg by mouth once., Disp: , Rfl:    Esketamine HCl, 84 MG Dose, (SPRAVATO , 84 MG DOSE,) 28 MG/DEVICE SOPK, Dispense X 3 (28 mg) devices for treatments every 7 days, administer intranasally, Disp: 3 each, Rfl: 3   esomeprazole (NEXIUM) 20 MG capsule, Take 20 mg by mouth once., Disp: , Rfl:    fluocinonide  ointment (LIDEX ) 0.05 %, Apply as directed to skin twice a day, Disp: 30 g, Rfl: 0    gabapentin  (NEURONTIN ) 300 MG capsule, Take 1 capsule (300 mg total) by mouth 3 (three) times daily. Take first dose 1 hour before arrival for surgery., Disp: 21 capsule, Rfl: 0   hydrocortisone  2.5 % cream, Apply a small amount to affected area twice daily for 1-2 weeks as directed., Disp: 20 g, Rfl: 0   ketoconazole  (NIZORAL ) 2 % cream, Apply a small amount to affected area on left foot/toe once a day, Disp: 30 g, Rfl: 0   Levomilnacipran  HCl ER (FETZIMA ) 120 MG CP24, Take 1 capsule by mouth daily., Disp: 90 capsule, Rfl: 3   LORazepam  (ATIVAN ) 0.5 MG tablet, Take 0.5-1 tablets (0.25-0.5 mg total) by mouth every 8 (eight) hours as needed for anxiety., Disp: 30 tablet, Rfl: 0   metFORMIN  (GLUCOPHAGE ) 500 MG tablet, Take 1 tablet (500 mg total) by mouth daily., Disp: 90 tablet, Rfl: 1   metFORMIN  (GLUCOPHAGE -XR) 500 MG 24 hr tablet, Take 1 tablet (500 mg total) by mouth daily. Please do not fill if not picked up within 30 days., Disp: 30 tablet, Rfl: 0   Norethindrone-Ethinyl Estradiol -Fe Biphas (LO LOESTRIN FE ) 1 MG-10 MCG / 10 MCG tablet, Take 1 tablet by mouth daily., Disp: 84 tablet, Rfl: 2   ondansetron  (ZOFRAN -ODT) 4 MG disintegrating tablet, Dissolve 1 tablet (4 mg total) by mouth every 8 (eight) hours as needed for nausea or vomiting., Disp: 20 tablet, Rfl: 1   traZODone  (DESYREL ) 50 MG tablet,  Take 50 mg by mouth at bedtime., Disp: , Rfl:    atovaquone -proguanil (MALARONE ) 250-100 MG TABS tablet, Take 1 tablet by mouth once daily begin 2 days prearrival,take daily during stay & continue for 7 days posttravel for malaria prevention, Disp: 24 tablet, Rfl: 0   azithromycin  (ZITHROMAX ) 500 MG tablet, For non bloody diarrhea,take 2 tabs on day 1, if resolved,stop med,If diarrhea persists 1 tab on day 2 & 3. For bloody diarrhea,2 tabs on day 1 & 1 tab on day 2 & 3, Disp: 4 tablet, Rfl: 0   meclizine  (ANTIVERT ) 25 MG tablet, Take 1 tablet (25 mg total) by mouth as directed prior to treatment., Disp: 60  tablet, Rfl: 2   metFORMIN  (GLUCOPHAGE ) 500 MG tablet, Take 1 tablet (500 mg total) by mouth 2 (two) times daily., Disp: 180 tablet, Rfl: 1   metFORMIN  (GLUCOPHAGE ) 500 MG tablet, Take 1 tablet (500 mg total) by mouth 2 (two) times daily., Disp: 90 tablet, Rfl: 1   methocarbamol  (ROBAXIN ) 500 MG tablet, Take 1 tablet (500 mg total) by mouth every 6 (six) hours as needed for muscle spasms. (Patient not taking: Reported on 10/29/2023), Disp: 45 tablet, Rfl: 0   ondansetron  (ZOFRAN ) 4 MG tablet, Take 1 tablet  by mouth every 6 hours as needed for nausea, Disp: 15 tablet, Rfl: 0   ondansetron  (ZOFRAN ) 4 MG tablet, Take 1 tablet (4 mg total) by mouth every 6 (six) hours for nausea. TAKE 4 tablets 15 minutes prior to arrival for surgery, Disp: 30 tablet, Rfl: 0   oxyCODONE  (OXY IR/ROXICODONE ) 5 MG immediate release tablet, Take 1-2 tablet by mouth every six to eight hours as needed for Post Op Pain. DO NOT EXCEED  6 tablets in 24 hours., Disp: 30 tablet, Rfl: 0   polyethylene glycol (MIRALAX / GLYCOLAX) packet, Take 17 g by mouth daily., Disp: , Rfl:    polyethylene glycol-electrolytes (NULYTELY) 420 g solution, Take 4,000 mLs by mouth Nightly., Disp: 4000 mL, Rfl: 0   Semaglutide -Weight Management (WEGOVY ) 2.4 MG/0.75ML SOAJ, Inject 2.4 mg into the skin once a week for weight loss., Disp: 3 mL, Rfl: 0   sulfamethoxazole -trimethoprim  (BACTRIM  DS) 800-160 MG tablet, Take 1 tablet by mouth 2 (two) times daily as directed. Start the day after surgery., Disp: 10 tablet, Rfl: 0   SUMAtriptan  (IMITREX ) 50 MG tablet, TAKE 1 TABLET BY MOUTH AT ONSET OF HEADACHE, MAY REPEAT 1 DOSE AFTER 2 HOURS IF NEEDED, Disp: 10 tablet, Rfl: 5   SUMAtriptan  (IMITREX ) 50 MG tablet, Take 1 tablet by mouth at onset of headache, may repeat same dose once 2 hours later if needed, Disp: 10 tablet, Rfl: 5   SUMAtriptan  (IMITREX ) 50 MG tablet, Take 1 tablet (50 mg total) by mouth at the onset of headache. May repeat 1 dose after 2 hours if  needed., Disp: 10 tablet, Rfl: 5   SUMAtriptan  (IMITREX ) 50 MG tablet, Take 1 tablet (50 mg total) by mouth at onset of headache,may repeat 1 dose after 2 hours if needed, 2 to 3 times a week, Disp: 9 tablet, Rfl: 6   SUMAtriptan  (IMITREX ) 50 MG tablet, Take 1 tablet (50 mg total) by mouth as needed at onset of headache. May repeat 1 dose after 2 hours if needed. Only take 2-3 times a week., Disp: 9 tablet, Rfl: 6   terbinafine  (LAMISIL ) 250 MG tablet, Take 1 tablet (250 mg total) by mouth daily for Tinea, Disp: 14 tablet, Rfl: 0   traZODone  (DESYREL ) 50 MG  tablet, TAKE 1 TO 2 TABLETS BY MOUTH AT BEDTIME, Disp: 180 tablet, Rfl: 1   traZODone  (DESYREL ) 50 MG tablet, Take 1-2 tablets (50-100 mg total) by mouth at bedtime., Disp: 180 tablet, Rfl: 1   traZODone  (DESYREL ) 50 MG tablet, Take 1-2 tablets (50-100 mg total) by mouth at bedtime., Disp: 180 tablet, Rfl: 1   WEGOVY  0.5 MG/0.5ML SOAJ, Inject 0.5 mg into the skin once a week for weight loss., Disp: 2 mL, Rfl: 0   WEGOVY  1 MG/0.5ML SOAJ, Inject 1 mg into the skin once a week for weight loss, Disp: 2 mL, Rfl: 0   WEGOVY  2.4 MG/0.75ML SOAJ, Inject 2.4 mg into the skin once a week for weight loss., Disp: 3 mL, Rfl: 0   WEGOVY  2.4 MG/0.75ML SOAJ, Inject 2.4 mg into the skin once a week fro weight loss, Disp: 3 mL, Rfl: 0   WEGOVY  2.4 MG/0.75ML SOAJ, Inject 2.4 mg into the skin once a week for weight loss, Disp: 3 mL, Rfl: 0   WEGOVY  2.4 MG/0.75ML SOAJ, Inject 2.4 mg,sub-q, into the skin once a week for weight loss, Disp: 3 mL, Rfl: 0   WEGOVY  2.4 MG/0.75ML SOAJ, Inject 2.4 mg into the skin once a week for weight loss., Disp: 3 mL, Rfl: 0   WEGOVY  2.4 MG/0.75ML SOAJ, Inject 2.4 mg into the skin once a week for weight loss., Disp: 3 mL, Rfl: 0   WEGOVY  2.4 MG/0.75ML SOAJ, Inject 2.4 mg into the skin once a week for weight loss, Disp: 3 mL, Rfl: 0 Medication Side Effects: none  Family Medical/ Social History: Changes?   No  MENTAL HEALTH  EXAM:  There were no vitals taken for this visit.There is no height or weight on file to calculate BMI.  General Appearance: Casual and Well Groomed  Eye Contact:  Good  Speech:  Clear and Coherent and Normal Rate  Volume:  Normal  Mood:  Euthymic  Affect:  Congruent  Thought Process:  Goal Directed and Descriptions of Associations: Circumstantial  Orientation:  Full (Time, Place, and Person)  Thought Content: Logical   Suicidal Thoughts:  No  Homicidal Thoughts:  No  Memory:  WNL  Judgement:  Good  Insight:  Good  Psychomotor Activity:  Normal  Concentration:  Concentration: Good and Attention Span: Good  Recall:  Good  Fund of Knowledge: Good  Language: Good  Assets:  Communication Skills Desire for Improvement Financial Resources/Insurance Housing Physical Health Resilience Social Support Transportation Vocational/Educational  ADL's:  Intact  Cognition: WNL  Prognosis:  Good   ECT-MADRS    Flowsheet Row Clinical Support from 06/18/2024 in [facility] Video Visit from 03/20/2024 in [facility] Clinical Support from 01/23/2024 in [facility]  MADRS Total Score 3 20 18    Patient was administered Spravato  84 mg intranasally today.  The patient experienced the typical dissociation which gradually resolved over the 2-hour period of observation.  There were no complications.  Specifically the patient did not have nausea or vomiting or headache.  Blood pressure was normal prior to treatment so clonidine was not given.  BPs remained within normal ranges at the 40-minute and 2-hour follow-up intervals.  By the time the 2-hour observation period was met the patient was alert and oriented and able to exit without assistance.  Patient feels the Spravato  administration is helpful for the treatment resistant depression and would like to continue the treatment.  See nursing note for further  details.  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 Generalized anxiety disorder  F41.1       Receiving Psychotherapy: No   RECOMMENDATIONS:   PDMP reviewed.  Spravato  filled 12/31/2023. Ativan  filled 06/18/2024.  I provided approximately  65 minutes of face to face time during this encounter, including time spent before and after the visit in records review, medical decision making, counseling pertinent to today's visit, and charting.   Doing well on the current regimen. No changes.   Continue Wellbutrin  XL 150 mg daily. Continue Vraylar  1.5 mg, 1 p.o. daily. Continue clonidine 0.1 mg, 1 p.o. prior to Spravato  treatment each week per protocol prn. Continue Spravato  weekly. Continue Fetzima  120 mg, 1 p.o. daily. Continue Ativan  0.5 mg, 1/2-1 p.o. every 8 hours as needed anxiety. Continue trazodone  50 mg, 1-2 nightly as needed sleep. Return in 1 week.   Verneita Cooks, PA-C  "

## 2024-08-27 ENCOUNTER — Encounter: Payer: Self-pay | Admitting: Physician Assistant

## 2024-08-28 ENCOUNTER — Encounter

## 2024-08-28 ENCOUNTER — Encounter: Admitting: Physician Assistant

## 2024-09-02 ENCOUNTER — Ambulatory Visit

## 2024-09-02 ENCOUNTER — Encounter: Payer: Self-pay | Admitting: Physician Assistant

## 2024-09-02 ENCOUNTER — Ambulatory Visit: Admitting: Physician Assistant

## 2024-09-02 VITALS — BP 155/94 | HR 73

## 2024-09-02 DIAGNOSIS — F339 Major depressive disorder, recurrent, unspecified: Secondary | ICD-10-CM

## 2024-09-02 DIAGNOSIS — F411 Generalized anxiety disorder: Secondary | ICD-10-CM

## 2024-09-02 NOTE — Progress Notes (Signed)
 NURSES NOTE:   Pt arrived for her # 73 Spravato  Treatment for treatment resistant depression, the starting dose was 56 mg (2 of the 28 mg nasal sprays) pt stayed at that dose for the first 4 treatments and also takes Zofran  and Meclizine  prior to arrival at office. Pt received 84 mg (3 of the 28 mg nasal sprays) total again today and did also premedicate. Pt is now using her medical benefits and receives Spravato  through [place] and [place], today is the first time using it. Pt sees Angeline Sayers, NP and she will follow her throughout her treatments and follow ups. Spravato  medication is stored at treatment center per REMS/FDA guidelines. The medication is required to be locked behind two doors per REMS/FDA protocol. Medication is also disposed of properly after each use per regulations. All documentation for REMS is completed and submitted per FDA/REMS requirements.          Pt directed to treatment room, started vitals at 10:15 AM, 132/84, pulse 78, SpO2 97%. Pt did take Zofran  4 mg and Meclizine  25 mg prior to arriving at office, an hour ahead. Instructed patient to blow her nose if needed then recline back to a 45 degree angle if that was more comfortable for her. Gave patient first dose 28 mg nasal spray, administered in each nostril as directed and observed by nurse, waited 5 more minutes for the second and third doses. After all doses given pt did not complain of nausea. pt keeps water and some mints to help with the taste of Spravato  it gives the metallic taste. Her 40 minute check was at 10:59 AM, vital signs were 157/96, pulse 79, SpO2 98%. Pt aware she would be monitored for a total time of 120 minutes.  Discharge vitals were taken at 12:01 PM 155/94 P 73, SpO2 98%. Pt met with Verneita Cooks, PA-C discuss her treatment.  Pt advised to relax the rest of the day. No driving, no intense activities. Verbalized understanding. Pt reports no issues today. Nurse was with pt a total of 60 minutes for clinical  assessment.  Pt instructed to call office with any problems or questions prior to next treatment. She is scheduled next Wednesday.      (84 mg) LOT [id number] EXP Nov 19 2026

## 2024-09-02 NOTE — Progress Notes (Signed)
 "     [facility]  Patient ID: Robin Stevenson,  MRN: [id number]  PCP: Signa Rush, MD (Inactive)  Date of Evaluation: 09/02/2024 Time spent:60 minutes  Chief Complaint:  Chief Complaint   Depression; Other     HISTORY/CURRENT STATUS: HPI for Spravato  treatment, she sees Angeline Sayers, NP who is out of the office today.  Robin Stevenson continues to do well.  She feels like all of her medications are working as they should.  Energy and motivation are good.   No extreme sadness, tearfulness, or feelings of hopelessness.  Sleeps ok.  ADLs and personal hygiene are normal.   Still having some stress relating to her parents moving to Assisted Living next month. No PA.  No mania, delirium, AH/VH.  No SI/HI.  Individual Medical History/ Review of Systems: Changes? :No   Past medications for mental health diagnoses include: Spravato , Xanax , Wellbutrin , Vraylar , gabapentin , Fetzima , trazodone   Allergies: Cat dander and Brewers yeast  Current Medications:  Current Outpatient Medications:    atorvastatin  (LIPITOR) 20 MG tablet, Take 1 tablet (20 mg total) by mouth daily., Disp: 90 tablet, Rfl: 3   buPROPion  (WELLBUTRIN  XL) 150 MG 24 hr tablet, Take 1 tablet (150 mg total) by mouth every morning., Disp: 90 tablet, Rfl: 0   cariprazine  (VRAYLAR ) 1.5 MG capsule, Take 1 capsule (1.5 mg total) by mouth daily., Disp: 28 capsule, Rfl: 0   celecoxib  (CELEBREX ) 200 MG capsule, Take 1 capsule by mouth single dose as directed. Take 1 hour prior to arrival for surgery, Disp: 1 capsule, Rfl: 0   Esketamine HCl, 84 MG Dose, (SPRAVATO , 84 MG DOSE,) 28 MG/DEVICE SOPK, Dispense X 3 (28 mg) devices for treatments every 7 days, administer intranasally, Disp: 3 each, Rfl: 3   esomeprazole (NEXIUM) 20 MG capsule, Take 20 mg by mouth once., Disp: , Rfl:    gabapentin  (NEURONTIN ) 300 MG capsule, Take 1 capsule (300 mg total) by mouth 3 (three) times daily. Take first dose 1 hour before arrival for surgery., Disp:  21 capsule, Rfl: 0   Levomilnacipran  HCl ER (FETZIMA ) 120 MG CP24, Take 1 capsule by mouth daily., Disp: 90 capsule, Rfl: 3   LORazepam  (ATIVAN ) 0.5 MG tablet, Take 0.5-1 tablets (0.25-0.5 mg total) by mouth every 8 (eight) hours as needed for anxiety., Disp: 30 tablet, Rfl: 0   meclizine  (ANTIVERT ) 25 MG tablet, Take 1 tablet (25 mg total) by mouth as directed prior to treatment., Disp: 60 tablet, Rfl: 2   metFORMIN  (GLUCOPHAGE -XR) 500 MG 24 hr tablet, Take 1 tablet (500 mg total) by mouth daily. Please do not fill if not picked up within 30 days., Disp: 30 tablet, Rfl: 0   Norethindrone-Ethinyl Estradiol -Fe Biphas (LO LOESTRIN FE ) 1 MG-10 MCG / 10 MCG tablet, Take 1 tablet by mouth daily., Disp: 84 tablet, Rfl: 2   ondansetron  (ZOFRAN -ODT) 4 MG disintegrating tablet, Dissolve 1 tablet (4 mg total) by mouth every 8 (eight) hours as needed for nausea or vomiting., Disp: 20 tablet, Rfl: 1   traZODone  (DESYREL ) 50 MG tablet, Take 50 mg by mouth at bedtime., Disp: , Rfl:    atovaquone -proguanil (MALARONE ) 250-100 MG TABS tablet, Take 1 tablet by mouth once daily begin 2 days prearrival,take daily during stay & continue for 7 days posttravel for malaria prevention, Disp: 24 tablet, Rfl: 0   azithromycin  (ZITHROMAX ) 500 MG tablet, For non bloody diarrhea,take 2 tabs on day 1, if resolved,stop med,If diarrhea persists 1 tab on day 2 & 3. For  bloody diarrhea,2 tabs on day 1 & 1 tab on day 2 & 3, Disp: 4 tablet, Rfl: 0   cloNIDine (CATAPRES) 0.1 MG tablet, Take 0.1 mg by mouth once., Disp: , Rfl:    fluocinonide  ointment (LIDEX ) 0.05 %, Apply as directed to skin twice a day, Disp: 30 g, Rfl: 0   hydrocortisone  2.5 % cream, Apply a small amount to affected area twice daily for 1-2 weeks as directed., Disp: 20 g, Rfl: 0   ketoconazole  (NIZORAL ) 2 % cream, Apply a small amount to affected area on left foot/toe once a day, Disp: 30 g, Rfl: 0   metFORMIN  (GLUCOPHAGE ) 500 MG tablet, Take 1 tablet (500 mg total) by  mouth daily., Disp: 90 tablet, Rfl: 1   metFORMIN  (GLUCOPHAGE ) 500 MG tablet, Take 1 tablet (500 mg total) by mouth 2 (two) times daily., Disp: 180 tablet, Rfl: 1   metFORMIN  (GLUCOPHAGE ) 500 MG tablet, Take 1 tablet (500 mg total) by mouth 2 (two) times daily., Disp: 90 tablet, Rfl: 1   methocarbamol  (ROBAXIN ) 500 MG tablet, Take 1 tablet (500 mg total) by mouth every 6 (six) hours as needed for muscle spasms. (Patient not taking: Reported on 10/29/2023), Disp: 45 tablet, Rfl: 0   ondansetron  (ZOFRAN ) 4 MG tablet, Take 1 tablet  by mouth every 6 hours as needed for nausea, Disp: 15 tablet, Rfl: 0   ondansetron  (ZOFRAN ) 4 MG tablet, Take 1 tablet (4 mg total) by mouth every 6 (six) hours for nausea. TAKE 4 tablets 15 minutes prior to arrival for surgery, Disp: 30 tablet, Rfl: 0   oxyCODONE  (OXY IR/ROXICODONE ) 5 MG immediate release tablet, Take 1-2 tablet by mouth every six to eight hours as needed for Post Op Pain. DO NOT EXCEED  6 tablets in 24 hours., Disp: 30 tablet, Rfl: 0   polyethylene glycol (MIRALAX / GLYCOLAX) packet, Take 17 g by mouth daily., Disp: , Rfl:    polyethylene glycol-electrolytes (NULYTELY) 420 g solution, Take 4,000 mLs by mouth Nightly., Disp: 4000 mL, Rfl: 0   Semaglutide -Weight Management (WEGOVY ) 2.4 MG/0.75ML SOAJ, Inject 2.4 mg into the skin once a week for weight loss., Disp: 3 mL, Rfl: 0   sulfamethoxazole -trimethoprim  (BACTRIM  DS) 800-160 MG tablet, Take 1 tablet by mouth 2 (two) times daily as directed. Start the day after surgery., Disp: 10 tablet, Rfl: 0   SUMAtriptan  (IMITREX ) 50 MG tablet, TAKE 1 TABLET BY MOUTH AT ONSET OF HEADACHE, MAY REPEAT 1 DOSE AFTER 2 HOURS IF NEEDED, Disp: 10 tablet, Rfl: 5   SUMAtriptan  (IMITREX ) 50 MG tablet, Take 1 tablet by mouth at onset of headache, may repeat same dose once 2 hours later if needed, Disp: 10 tablet, Rfl: 5   SUMAtriptan  (IMITREX ) 50 MG tablet, Take 1 tablet (50 mg total) by mouth at the onset of headache. May repeat 1  dose after 2 hours if needed., Disp: 10 tablet, Rfl: 5   SUMAtriptan  (IMITREX ) 50 MG tablet, Take 1 tablet (50 mg total) by mouth at onset of headache,may repeat 1 dose after 2 hours if needed, 2 to 3 times a week, Disp: 9 tablet, Rfl: 6   SUMAtriptan  (IMITREX ) 50 MG tablet, Take 1 tablet (50 mg total) by mouth as needed at onset of headache. May repeat 1 dose after 2 hours if needed. Only take 2-3 times a week., Disp: 9 tablet, Rfl: 6   terbinafine  (LAMISIL ) 250 MG tablet, Take 1 tablet (250 mg total) by mouth daily for Tinea, Disp: 14 tablet, Rfl:  0   traZODone  (DESYREL ) 50 MG tablet, TAKE 1 TO 2 TABLETS BY MOUTH AT BEDTIME, Disp: 180 tablet, Rfl: 1   traZODone  (DESYREL ) 50 MG tablet, Take 1-2 tablets (50-100 mg total) by mouth at bedtime., Disp: 180 tablet, Rfl: 1   traZODone  (DESYREL ) 50 MG tablet, Take 1-2 tablets (50-100 mg total) by mouth at bedtime., Disp: 180 tablet, Rfl: 1   WEGOVY  0.5 MG/0.5ML SOAJ, Inject 0.5 mg into the skin once a week for weight loss., Disp: 2 mL, Rfl: 0   WEGOVY  1 MG/0.5ML SOAJ, Inject 1 mg into the skin once a week for weight loss, Disp: 2 mL, Rfl: 0   WEGOVY  2.4 MG/0.75ML SOAJ, Inject 2.4 mg into the skin once a week for weight loss., Disp: 3 mL, Rfl: 0   WEGOVY  2.4 MG/0.75ML SOAJ, Inject 2.4 mg into the skin once a week fro weight loss, Disp: 3 mL, Rfl: 0   WEGOVY  2.4 MG/0.75ML SOAJ, Inject 2.4 mg into the skin once a week for weight loss, Disp: 3 mL, Rfl: 0   WEGOVY  2.4 MG/0.75ML SOAJ, Inject 2.4 mg,sub-q, into the skin once a week for weight loss, Disp: 3 mL, Rfl: 0   WEGOVY  2.4 MG/0.75ML SOAJ, Inject 2.4 mg into the skin once a week for weight loss., Disp: 3 mL, Rfl: 0   WEGOVY  2.4 MG/0.75ML SOAJ, Inject 2.4 mg into the skin once a week for weight loss., Disp: 3 mL, Rfl: 0   WEGOVY  2.4 MG/0.75ML SOAJ, Inject 2.4 mg into the skin once a week for weight loss, Disp: 3 mL, Rfl: 0 Medication Side Effects: none  Family Medical/ Social History: Changes?    No  MENTAL HEALTH EXAM:  There were no vitals taken for this visit.There is no height or weight on file to calculate BMI.  General Appearance: Casual and Well Groomed  Eye Contact:  Good  Speech:  Clear and Coherent and Normal Rate  Volume:  Normal  Mood:  Euthymic  Affect:  Congruent  Thought Process:  Goal Directed and Descriptions of Associations: Circumstantial  Orientation:  Full (Time, Place, and Person)  Thought Content: Logical   Suicidal Thoughts:  No  Homicidal Thoughts:  No  Memory:  WNL  Judgement:  Good  Insight:  Good  Psychomotor Activity:  Normal  Concentration:  Concentration: Good and Attention Span: Good  Recall:  Good  Fund of Knowledge: Good  Language: Good  Assets:  Communication Skills Desire for Improvement Financial Resources/Insurance Housing Leisure Time Physical Health Resilience Social Support Transportation Vocational/Educational  ADL's:  Intact  Cognition: WNL  Prognosis:  Good   ECT-MADRS    Flowsheet Row Clinical Support from 06/18/2024 in [facility] Video Visit from 03/20/2024 in [facility] Clinical Support from 01/23/2024 in [facility]  MADRS Total Score 3 20 18    Patient was administered Spravato  84 mg intranasally today.  The patient experienced the typical dissociation which gradually resolved over the 2-hour period of observation.  There were no complications.  Specifically the patient did not have nausea or vomiting or headache.  Blood pressure was normal prior to treatment so clonidine was not given.  BPs remained within normal ranges at the 40-minute and 2-hour follow-up intervals.  By the time the 2-hour observation period was met the patient was alert and oriented and able to exit without assistance.  Patient feels the Spravato  administration is helpful for the treatment resistant depression and would like to continue  the treatment.  See nursing  note for further details.  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 Generalized anxiety disorder  F41.1        Receiving Psychotherapy: No   RECOMMENDATIONS:   PDMP reviewed.  Spravato  filled 12/31/2023. Ativan  filled 06/18/2024.  I provided approximately  60 minutes of face to face time during this encounter, including time spent before and after the visit in records review, medical decision making, counseling pertinent to today's visit, and charting.   Doing well on the current regimen. No changes.   Continue Wellbutrin  XL 150 mg daily. Continue Vraylar  1.5 mg, 1 p.o. daily. Continue clonidine 0.1 mg, 1 p.o. prior to Spravato  treatment each week per protocol prn. Continue Spravato  weekly. Continue Fetzima  120 mg, 1 p.o. daily. Continue Ativan  0.5 mg, 1/2-1 p.o. every 8 hours as needed anxiety. Continue trazodone  50 mg, 1-2 nightly as needed sleep. Return in 1 week.   Verneita Cooks, PA-C  "

## 2024-09-10 ENCOUNTER — Ambulatory Visit

## 2024-09-10 ENCOUNTER — Ambulatory Visit: Admitting: Physician Assistant

## 2024-09-10 ENCOUNTER — Encounter: Payer: Self-pay | Admitting: Physician Assistant

## 2024-09-10 VITALS — BP 150/88 | HR 69

## 2024-09-10 DIAGNOSIS — F418 Other specified anxiety disorders: Secondary | ICD-10-CM

## 2024-09-10 DIAGNOSIS — F339 Major depressive disorder, recurrent, unspecified: Secondary | ICD-10-CM

## 2024-09-10 NOTE — Progress Notes (Signed)
 "     [facility]  Patient ID: Robin Stevenson,  MRN: [id number]  PCP: Signa Rush, MD (Inactive)  Date of Evaluation: 09/10/2024 Time spent:65 minutes  Chief Complaint:  Chief Complaint   Depression; Other    HISTORY/CURRENT STATUS: HPI for Spravato  treatment, she sees Angeline Sayers, NP who is out of the office today.  [name] is doing well for the most part.  She is still under some stress with upcoming move to an assisted living home.  But it is all going as expected.  She does experience situational anxiety sometimes but is able to get through it on her own.  She did take Ativan  1 time and it made her really loopy.  She feels like the Spravato  and all other meds are effective.  Energy and motivation are good most of the time.  No extreme sadness, tearfulness, or feelings of hopelessness.  Sleeps ok . ADLs and personal hygiene are normal.   Appetite has not changed.  No mania, delirium, AH/VH.  No SI/HI.  Individual Medical History/ Review of Systems: Changes? :No   Past medications for mental health diagnoses include: Spravato , Xanax , Wellbutrin , Vraylar , gabapentin , Fetzima , trazodone   Allergies: Cat dander and Brewers yeast  Current Medications:  Current Outpatient Medications:    atorvastatin  (LIPITOR) 20 MG tablet, Take 1 tablet (20 mg total) by mouth daily., Disp: 90 tablet, Rfl: 3   buPROPion  (WELLBUTRIN  XL) 150 MG 24 hr tablet, Take 1 tablet (150 mg total) by mouth every morning., Disp: 90 tablet, Rfl: 0   cariprazine  (VRAYLAR ) 1.5 MG capsule, Take 1 capsule (1.5 mg total) by mouth daily., Disp: 28 capsule, Rfl: 0   Esketamine HCl, 84 MG Dose, (SPRAVATO , 84 MG DOSE,) 28 MG/DEVICE SOPK, Dispense X 3 (28 mg) devices for treatments every 7 days, administer intranasally, Disp: 3 each, Rfl: 3   esomeprazole (NEXIUM) 20 MG capsule, Take 20 mg by mouth once., Disp: , Rfl:    gabapentin  (NEURONTIN ) 300 MG capsule, Take 1 capsule (300 mg total) by mouth 3 (three) times  daily. Take first dose 1 hour before arrival for surgery., Disp: 21 capsule, Rfl: 0   Levomilnacipran  HCl ER (FETZIMA ) 120 MG CP24, Take 1 capsule by mouth daily., Disp: 90 capsule, Rfl: 3   LORazepam  (ATIVAN ) 0.5 MG tablet, Take 0.5-1 tablets (0.25-0.5 mg total) by mouth every 8 (eight) hours as needed for anxiety., Disp: 30 tablet, Rfl: 0   meclizine  (ANTIVERT ) 25 MG tablet, Take 1 tablet (25 mg total) by mouth as directed prior to treatment., Disp: 60 tablet, Rfl: 2   metFORMIN  (GLUCOPHAGE ) 500 MG tablet, Take 1 tablet (500 mg total) by mouth daily., Disp: 90 tablet, Rfl: 1   Norethindrone-Ethinyl Estradiol -Fe Biphas (LO LOESTRIN FE ) 1 MG-10 MCG / 10 MCG tablet, Take 1 tablet by mouth daily., Disp: 84 tablet, Rfl: 2   ondansetron  (ZOFRAN ) 4 MG tablet, Take 1 tablet  by mouth every 6 hours as needed for nausea, Disp: 15 tablet, Rfl: 0   SUMAtriptan  (IMITREX ) 50 MG tablet, Take 1 tablet by mouth at onset of headache, may repeat same dose once 2 hours later if needed, Disp: 10 tablet, Rfl: 5   traZODone  (DESYREL ) 50 MG tablet, Take 50 mg by mouth at bedtime., Disp: , Rfl:    atovaquone -proguanil (MALARONE ) 250-100 MG TABS tablet, Take 1 tablet by mouth once daily begin 2 days prearrival,take daily during stay & continue for 7 days posttravel for malaria prevention, Disp: 24 tablet, Rfl: 0  azithromycin  (ZITHROMAX ) 500 MG tablet, For non bloody diarrhea,take 2 tabs on day 1, if resolved,stop med,If diarrhea persists 1 tab on day 2 & 3. For bloody diarrhea,2 tabs on day 1 & 1 tab on day 2 & 3, Disp: 4 tablet, Rfl: 0   celecoxib  (CELEBREX ) 200 MG capsule, Take 1 capsule by mouth single dose as directed. Take 1 hour prior to arrival for surgery, Disp: 1 capsule, Rfl: 0   cloNIDine (CATAPRES) 0.1 MG tablet, Take 0.1 mg by mouth once., Disp: , Rfl:    fluocinonide  ointment (LIDEX ) 0.05 %, Apply as directed to skin twice a day, Disp: 30 g, Rfl: 0   hydrocortisone  2.5 % cream, Apply a small amount to affected  area twice daily for 1-2 weeks as directed., Disp: 20 g, Rfl: 0   ketoconazole  (NIZORAL ) 2 % cream, Apply a small amount to affected area on left foot/toe once a day, Disp: 30 g, Rfl: 0   metFORMIN  (GLUCOPHAGE ) 500 MG tablet, Take 1 tablet (500 mg total) by mouth 2 (two) times daily., Disp: 180 tablet, Rfl: 1   metFORMIN  (GLUCOPHAGE ) 500 MG tablet, Take 1 tablet (500 mg total) by mouth 2 (two) times daily., Disp: 90 tablet, Rfl: 1   metFORMIN  (GLUCOPHAGE -XR) 500 MG 24 hr tablet, Take 1 tablet (500 mg total) by mouth daily. Please do not fill if not picked up within 30 days., Disp: 30 tablet, Rfl: 0   methocarbamol  (ROBAXIN ) 500 MG tablet, Take 1 tablet (500 mg total) by mouth every 6 (six) hours as needed for muscle spasms. (Patient not taking: Reported on 10/29/2023), Disp: 45 tablet, Rfl: 0   ondansetron  (ZOFRAN ) 4 MG tablet, Take 1 tablet (4 mg total) by mouth every 6 (six) hours for nausea. TAKE 4 tablets 15 minutes prior to arrival for surgery, Disp: 30 tablet, Rfl: 0   ondansetron  (ZOFRAN -ODT) 4 MG disintegrating tablet, Dissolve 1 tablet (4 mg total) by mouth every 8 (eight) hours as needed for nausea or vomiting., Disp: 20 tablet, Rfl: 1   oxyCODONE  (OXY IR/ROXICODONE ) 5 MG immediate release tablet, Take 1-2 tablet by mouth every six to eight hours as needed for Post Op Pain. DO NOT EXCEED  6 tablets in 24 hours., Disp: 30 tablet, Rfl: 0   polyethylene glycol (MIRALAX / GLYCOLAX) packet, Take 17 g by mouth daily., Disp: , Rfl:    polyethylene glycol-electrolytes (NULYTELY) 420 g solution, Take 4,000 mLs by mouth Nightly., Disp: 4000 mL, Rfl: 0   Semaglutide -Weight Management (WEGOVY ) 2.4 MG/0.75ML SOAJ, Inject 2.4 mg into the skin once a week for weight loss., Disp: 3 mL, Rfl: 0   sulfamethoxazole -trimethoprim  (BACTRIM  DS) 800-160 MG tablet, Take 1 tablet by mouth 2 (two) times daily as directed. Start the day after surgery., Disp: 10 tablet, Rfl: 0   SUMAtriptan  (IMITREX ) 50 MG tablet, TAKE 1  TABLET BY MOUTH AT ONSET OF HEADACHE, MAY REPEAT 1 DOSE AFTER 2 HOURS IF NEEDED, Disp: 10 tablet, Rfl: 5   SUMAtriptan  (IMITREX ) 50 MG tablet, Take 1 tablet (50 mg total) by mouth at the onset of headache. May repeat 1 dose after 2 hours if needed., Disp: 10 tablet, Rfl: 5   SUMAtriptan  (IMITREX ) 50 MG tablet, Take 1 tablet (50 mg total) by mouth at onset of headache,may repeat 1 dose after 2 hours if needed, 2 to 3 times a week, Disp: 9 tablet, Rfl: 6   SUMAtriptan  (IMITREX ) 50 MG tablet, Take 1 tablet (50 mg total) by mouth as needed at onset  of headache. May repeat 1 dose after 2 hours if needed. Only take 2-3 times a week., Disp: 9 tablet, Rfl: 6   terbinafine  (LAMISIL ) 250 MG tablet, Take 1 tablet (250 mg total) by mouth daily for Tinea, Disp: 14 tablet, Rfl: 0   traZODone  (DESYREL ) 50 MG tablet, TAKE 1 TO 2 TABLETS BY MOUTH AT BEDTIME, Disp: 180 tablet, Rfl: 1   traZODone  (DESYREL ) 50 MG tablet, Take 1-2 tablets (50-100 mg total) by mouth at bedtime., Disp: 180 tablet, Rfl: 1   traZODone  (DESYREL ) 50 MG tablet, Take 1-2 tablets (50-100 mg total) by mouth at bedtime., Disp: 180 tablet, Rfl: 1   WEGOVY  0.5 MG/0.5ML SOAJ, Inject 0.5 mg into the skin once a week for weight loss., Disp: 2 mL, Rfl: 0   WEGOVY  1 MG/0.5ML SOAJ, Inject 1 mg into the skin once a week for weight loss, Disp: 2 mL, Rfl: 0   WEGOVY  2.4 MG/0.75ML SOAJ, Inject 2.4 mg into the skin once a week for weight loss., Disp: 3 mL, Rfl: 0   WEGOVY  2.4 MG/0.75ML SOAJ, Inject 2.4 mg into the skin once a week fro weight loss, Disp: 3 mL, Rfl: 0   WEGOVY  2.4 MG/0.75ML SOAJ, Inject 2.4 mg into the skin once a week for weight loss, Disp: 3 mL, Rfl: 0   WEGOVY  2.4 MG/0.75ML SOAJ, Inject 2.4 mg,sub-q, into the skin once a week for weight loss, Disp: 3 mL, Rfl: 0   WEGOVY  2.4 MG/0.75ML SOAJ, Inject 2.4 mg into the skin once a week for weight loss., Disp: 3 mL, Rfl: 0   WEGOVY  2.4 MG/0.75ML SOAJ, Inject 2.4 mg into the skin once a week for weight  loss., Disp: 3 mL, Rfl: 0   WEGOVY  2.4 MG/0.75ML SOAJ, Inject 2.4 mg into the skin once a week for weight loss, Disp: 3 mL, Rfl: 0 Medication Side Effects: none  Family Medical/ Social History: Changes?   No  MENTAL HEALTH EXAM:  There were no vitals taken for this visit.There is no height or weight on file to calculate BMI.  General Appearance: Casual and Well Groomed  Eye Contact:  Good  Speech:  Clear and Coherent and Normal Rate  Volume:  Normal  Mood:  Euthymic  Affect:  Congruent  Thought Process:  Goal Directed and Descriptions of Associations: Circumstantial  Orientation:  Full (Time, Place, and Person)  Thought Content: Logical   Suicidal Thoughts:  No  Homicidal Thoughts:  No  Memory:  WNL  Judgement:  Good  Insight:  Good  Psychomotor Activity:  Normal  Concentration:  Concentration: Good and Attention Span: Good  Recall:  Good  Fund of Knowledge: Good  Language: Good  Assets:  Communication Skills Desire for Improvement Financial Resources/Insurance Housing Leisure Time Resilience Social Support Transportation Vocational/Educational  ADL's:  Intact  Cognition: WNL  Prognosis:  Good   ECT-MADRS    Flowsheet Row Clinical Support from 06/18/2024 in [facility] Video Visit from 03/20/2024 in [facility] Clinical Support from 01/23/2024 in [facility]  MADRS Total Score 3 20 18    Patient was administered Spravato  84 mg intranasally today.  The patient experienced the typical dissociation which gradually resolved over the 2-hour period of observation.  There were no complications.  Specifically the patient did not have nausea or vomiting or headache.  Blood pressure was normal prior to treatment so clonidine was not given.  BP at the 40-minute mark was mildly elevated but returned  to normal at the 2-hour BP after treatment.  By the time the 2-hour observation period was met the  patient was alert and oriented and able to exit without assistance.  Patient feels the Spravato  administration is helpful for the treatment resistant depression and would like to continue the treatment.  See nursing note for further details.  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 Situational anxiety  F41.8      Receiving Psychotherapy: No   RECOMMENDATIONS:   PDMP reviewed.  Spravato  filled 12/31/2023. Ativan  filled 06/18/2024.  I provided approximately 65 minutes of face to face time during this encounter, including time spent before and after the visit in records review, medical decision making, counseling pertinent to today's visit, and charting.   She continues to do well on the current treatment regimen so no changes will be made.  Continue Wellbutrin  XL 150 mg daily. Continue Vraylar  1.5 mg, 1 p.o. daily. Continue clonidine 0.1 mg, 1 p.o. prior to Spravato  treatment each week per protocol prn. Continue Spravato  weekly. Continue Fetzima  120 mg, 1 p.o. daily. Continue Ativan  0.5 mg, 1/2-1 p.o. every 8 hours as needed anxiety. Continue trazodone  50 mg, 1-2 nightly as needed sleep. Return in 1 week.   Verneita Cooks, PA-C  "

## 2024-09-11 ENCOUNTER — Other Ambulatory Visit (HOSPITAL_COMMUNITY): Payer: Self-pay

## 2024-09-11 ENCOUNTER — Other Ambulatory Visit: Payer: Self-pay

## 2024-09-11 NOTE — Progress Notes (Signed)
 NURSES NOTE:   Pt arrived for her # 64 Spravato  Treatment for treatment resistant depression, the starting dose was 56 mg (2 of the 28 mg nasal sprays) pt stayed at that dose for the first 4 treatments and also takes Zofran  and Meclizine  prior to arrival at office. Pt received 84 mg (3 of the 28 mg nasal sprays) total again today and did also premedicate. Pt is now using her medical benefits and receives Spravato  through [facility], today is the first time using it. Pt sees Angeline Sayers, NP and she will follow her throughout her treatments and follow ups. Spravato  medication is stored at treatment center per REMS/FDA guidelines. The medication is required to be locked behind two doors per REMS/FDA protocol. Medication is also disposed of properly after each use per regulations. All documentation for REMS is completed and submitted per FDA/REMS requirements.          Pt directed to treatment room, started vitals at 10:28 AM, 139/87, pulse 76, SpO2 99%. Pt did take Zofran  4 mg and Meclizine  25 mg prior to arriving at office, an hour ahead. Instructed patient to blow her nose if needed then recline back to a 45 degree angle if that was more comfortable for her. Gave patient first dose 28 mg nasal spray, administered in each nostril as directed and observed by nurse, waited 5 more minutes for the second and third doses. After all doses given pt did not complain of nausea. pt keeps water and some mints to help with the taste of Spravato  it gives the metallic taste. Her 40 minute check was at 11:10 AM, vital signs were 162/96, pulse 76, SpO2 99%. Pt aware she would be monitored for a total time of 120 minutes.  Discharge vitals were taken at 12:30 PM 150/88 P 69, SpO2 99%. Pt met with Verneita Cooks, PA-C discuss her treatment.  Pt advised to relax the rest of the day. No driving, no intense activities. Verbalized understanding. Pt reports no issues today. Nurse was with pt a total of 60 minutes for clinical  assessment.  Pt instructed to call office with any problems or questions prior to next treatment. She is scheduled next Wednesday.      (84 mg) LOT [id number] EXP Nov 19 2026

## 2024-09-12 ENCOUNTER — Other Ambulatory Visit (HOSPITAL_COMMUNITY): Payer: Self-pay

## 2024-09-12 ENCOUNTER — Other Ambulatory Visit: Payer: Self-pay | Admitting: Physician Assistant

## 2024-09-12 ENCOUNTER — Other Ambulatory Visit: Payer: Self-pay | Admitting: Adult Health

## 2024-09-12 ENCOUNTER — Other Ambulatory Visit: Payer: Self-pay

## 2024-09-12 MED ORDER — FETZIMA 120 MG PO CP24: 1.0000 | ORAL_CAPSULE | Freq: Every day | ORAL | 3 refills | Status: DC

## 2024-09-15 ENCOUNTER — Other Ambulatory Visit: Payer: Self-pay

## 2024-09-16 ENCOUNTER — Telehealth: Payer: Self-pay | Admitting: Adult Health

## 2024-09-16 ENCOUNTER — Other Ambulatory Visit (HOSPITAL_COMMUNITY): Payer: Self-pay

## 2024-09-16 NOTE — Telephone Encounter (Signed)
 Spoke with the patient. She stated the pharmacy informed her that Fetzima  is on backorder, not Wellbutrin  as noted in the previous message. She is requesting an alternative medication to use until Fetzima  becomes available. I suggested she check with another pharmacy, but she reported that due to her [facility], she would have to pay $1,700 out of pocket  She has an appt., w/you tomorrow

## 2024-09-16 NOTE — Telephone Encounter (Signed)
 [name] lvm that the wellbutrin  is back ordered and at the pharmacy and she can;t get it. She wants gina to prescribe something else until that that medicine comes in. Please call her at [phone]

## 2024-09-16 NOTE — Telephone Encounter (Signed)
 Yes she looked for samples, is that what you mean?  Or check about a PA or something for the Fetzima  at a non-[facility] pharmacy? If that, then no.

## 2024-09-16 NOTE — Telephone Encounter (Signed)
 Tillman, please see Gloria's message above.  I sent in the Fetzima  to [facility] on Friday like we had discussed.  I looked for Fetzima  samples just now but we do not have any.  I am happy to send something else to replace the Fetzima  but wanted to check with you first.  Thanks.

## 2024-09-16 NOTE — Telephone Encounter (Signed)
 [name], to update you on what's going on with the Fetzima .

## 2024-09-17 ENCOUNTER — Other Ambulatory Visit: Payer: Self-pay

## 2024-09-17 ENCOUNTER — Other Ambulatory Visit (HOSPITAL_COMMUNITY): Payer: Self-pay

## 2024-09-17 ENCOUNTER — Ambulatory Visit: Admitting: Physician Assistant

## 2024-09-17 ENCOUNTER — Encounter: Payer: Self-pay | Admitting: Physician Assistant

## 2024-09-17 ENCOUNTER — Ambulatory Visit

## 2024-09-17 VITALS — BP 130/95 | HR 71

## 2024-09-17 DIAGNOSIS — F418 Other specified anxiety disorders: Secondary | ICD-10-CM

## 2024-09-17 DIAGNOSIS — F339 Major depressive disorder, recurrent, unspecified: Secondary | ICD-10-CM

## 2024-09-17 DIAGNOSIS — F331 Major depressive disorder, recurrent, moderate: Secondary | ICD-10-CM

## 2024-09-17 MED ORDER — FETZIMA 120 MG PO CP24
1.0000 | ORAL_CAPSULE | Freq: Every day | ORAL | 3 refills | Status: AC
  Filled 2024-09-17: qty 90, 90d supply, fill #0

## 2024-09-17 NOTE — Telephone Encounter (Signed)
 Apparently there has been some conflicting information about Fetzima . A new 90 day supply Fetzima  120 mg sent to [facility], they ran it through her insurance and it will be $22.00. it was ordered and will be in tomorrow per pharmacist. Pt is aware.

## 2024-09-17 NOTE — Progress Notes (Signed)
 "     [facility]  Patient ID: Robin Stevenson,  MRN: [id number]  PCP: Signa Rush, MD (Inactive)  Date of Evaluation: 09/17/2024 Time spent:60 minutes  Chief Complaint:  Chief Complaint   Depression; Other    HISTORY/CURRENT STATUS: HPI for Spravato  treatment, she sees Angeline Sayers, NP who is out of the office today.  There's been an issue with getting the Fetzima , at first one of the [facility] pharmacies said it was on back order; Rx was sent to [facility] and it was going to cost $1,700 or so. Now it seems that the pharmacy at [facility] will have it tomorrow. She will have missed 2 days if she can't get it tomorrow.   As far as the Spravato  goes, she is doing well.  No symptoms of depression.  She is under quite a bit of stress as she and her siblings are moving their parents to an assisted living condo.  She will be going next week to help them pack.  No panic attacks.  She is sleeping okay.  No mania, delirium, psychosis, suicidal or homicidal thoughts.  Individual Medical History/ Review of Systems: Changes? :No   Past medications for mental health diagnoses include: Spravato , Xanax , Wellbutrin , Vraylar , gabapentin , Fetzima , trazodone   Allergies: Cat dander and Brewers yeast  Current Medications:  Current Outpatient Medications:    atorvastatin  (LIPITOR) 20 MG tablet, Take 1 tablet (20 mg total) by mouth daily., Disp: 90 tablet, Rfl: 3   atovaquone -proguanil (MALARONE ) 250-100 MG TABS tablet, Take 1 tablet by mouth once daily begin 2 days prearrival,take daily during stay & continue for 7 days posttravel for malaria prevention, Disp: 24 tablet, Rfl: 0   azithromycin  (ZITHROMAX ) 500 MG tablet, For non bloody diarrhea,take 2 tabs on day 1, if resolved,stop med,If diarrhea persists 1 tab on day 2 & 3. For bloody diarrhea,2 tabs on day 1 & 1 tab on day 2 & 3, Disp: 4 tablet, Rfl: 0   buPROPion  (WELLBUTRIN  XL) 150 MG 24 hr tablet, Take 1 tablet (150 mg total) by mouth every  morning., Disp: 90 tablet, Rfl: 0   cariprazine  (VRAYLAR ) 1.5 MG capsule, Take 1 capsule (1.5 mg total) by mouth daily., Disp: 28 capsule, Rfl: 0   celecoxib  (CELEBREX ) 200 MG capsule, Take 1 capsule by mouth single dose as directed. Take 1 hour prior to arrival for surgery, Disp: 1 capsule, Rfl: 0   cloNIDine (CATAPRES) 0.1 MG tablet, Take 0.1 mg by mouth once., Disp: , Rfl:    Esketamine HCl, 84 MG Dose, (SPRAVATO , 84 MG DOSE,) 28 MG/DEVICE SOPK, Dispense X 3 (28 mg) devices for treatments every 7 days, administer intranasally, Disp: 3 each, Rfl: 3   esomeprazole (NEXIUM) 20 MG capsule, Take 20 mg by mouth once., Disp: , Rfl:    fluocinonide  ointment (LIDEX ) 0.05 %, Apply as directed to skin twice a day, Disp: 30 g, Rfl: 0   gabapentin  (NEURONTIN ) 300 MG capsule, Take 1 capsule (300 mg total) by mouth 3 (three) times daily. Take first dose 1 hour before arrival for surgery., Disp: 21 capsule, Rfl: 0   hydrocortisone  2.5 % cream, Apply a small amount to affected area twice daily for 1-2 weeks as directed., Disp: 20 g, Rfl: 0   ketoconazole  (NIZORAL ) 2 % cream, Apply a small amount to affected area on left foot/toe once a day, Disp: 30 g, Rfl: 0   Levomilnacipran  HCl ER (FETZIMA ) 120 MG CP24, Take 1 capsule by mouth daily., Disp: 90 capsule,  Rfl: 3   LORazepam  (ATIVAN ) 0.5 MG tablet, Take 0.5-1 tablets (0.25-0.5 mg total) by mouth every 8 (eight) hours as needed for anxiety., Disp: 30 tablet, Rfl: 0   meclizine  (ANTIVERT ) 25 MG tablet, Take 1 tablet (25 mg total) by mouth as directed prior to treatment., Disp: 60 tablet, Rfl: 2   metFORMIN  (GLUCOPHAGE -XR) 500 MG 24 hr tablet, Take 1 tablet (500 mg total) by mouth daily. Please do not fill if not picked up within 30 days., Disp: 30 tablet, Rfl: 0   ondansetron  (ZOFRAN -ODT) 4 MG disintegrating tablet, Dissolve 1 tablet (4 mg total) by mouth every 8 (eight) hours as needed for nausea or vomiting., Disp: 20 tablet, Rfl: 1   traZODone  (DESYREL ) 50 MG  tablet, Take 1-2 tablets (50-100 mg total) by mouth at bedtime., Disp: 180 tablet, Rfl: 1   metFORMIN  (GLUCOPHAGE ) 500 MG tablet, Take 1 tablet (500 mg total) by mouth daily., Disp: 90 tablet, Rfl: 1   metFORMIN  (GLUCOPHAGE ) 500 MG tablet, Take 1 tablet (500 mg total) by mouth 2 (two) times daily., Disp: 180 tablet, Rfl: 1   metFORMIN  (GLUCOPHAGE ) 500 MG tablet, Take 1 tablet (500 mg total) by mouth 2 (two) times daily., Disp: 90 tablet, Rfl: 1   methocarbamol  (ROBAXIN ) 500 MG tablet, Take 1 tablet (500 mg total) by mouth every 6 (six) hours as needed for muscle spasms. (Patient not taking: Reported on 10/29/2023), Disp: 45 tablet, Rfl: 0   Norethindrone-Ethinyl Estradiol -Fe Biphas (LO LOESTRIN FE ) 1 MG-10 MCG / 10 MCG tablet, Take 1 tablet by mouth daily., Disp: 84 tablet, Rfl: 2   ondansetron  (ZOFRAN ) 4 MG tablet, Take 1 tablet  by mouth every 6 hours as needed for nausea, Disp: 15 tablet, Rfl: 0   ondansetron  (ZOFRAN ) 4 MG tablet, Take 1 tablet (4 mg total) by mouth every 6 (six) hours for nausea. TAKE 4 tablets 15 minutes prior to arrival for surgery, Disp: 30 tablet, Rfl: 0   oxyCODONE  (OXY IR/ROXICODONE ) 5 MG immediate release tablet, Take 1-2 tablet by mouth every six to eight hours as needed for Post Op Pain. DO NOT EXCEED  6 tablets in 24 hours., Disp: 30 tablet, Rfl: 0   polyethylene glycol (MIRALAX / GLYCOLAX) packet, Take 17 g by mouth daily., Disp: , Rfl:    polyethylene glycol-electrolytes (NULYTELY) 420 g solution, Take 4,000 mLs by mouth Nightly., Disp: 4000 mL, Rfl: 0   Semaglutide -Weight Management (WEGOVY ) 2.4 MG/0.75ML SOAJ, Inject 2.4 mg into the skin once a week for weight loss., Disp: 3 mL, Rfl: 0   sulfamethoxazole -trimethoprim  (BACTRIM  DS) 800-160 MG tablet, Take 1 tablet by mouth 2 (two) times daily as directed. Start the day after surgery., Disp: 10 tablet, Rfl: 0   SUMAtriptan  (IMITREX ) 50 MG tablet, TAKE 1 TABLET BY MOUTH AT ONSET OF HEADACHE, MAY REPEAT 1 DOSE AFTER 2 HOURS  IF NEEDED, Disp: 10 tablet, Rfl: 5   SUMAtriptan  (IMITREX ) 50 MG tablet, Take 1 tablet by mouth at onset of headache, may repeat same dose once 2 hours later if needed, Disp: 10 tablet, Rfl: 5   SUMAtriptan  (IMITREX ) 50 MG tablet, Take 1 tablet (50 mg total) by mouth at the onset of headache. May repeat 1 dose after 2 hours if needed., Disp: 10 tablet, Rfl: 5   SUMAtriptan  (IMITREX ) 50 MG tablet, Take 1 tablet (50 mg total) by mouth at onset of headache,may repeat 1 dose after 2 hours if needed, 2 to 3 times a week, Disp: 9 tablet, Rfl: 6  SUMAtriptan  (IMITREX ) 50 MG tablet, Take 1 tablet (50 mg total) by mouth as needed at onset of headache. May repeat 1 dose after 2 hours if needed. Only take 2-3 times a week., Disp: 9 tablet, Rfl: 6   terbinafine  (LAMISIL ) 250 MG tablet, Take 1 tablet (250 mg total) by mouth daily for Tinea, Disp: 14 tablet, Rfl: 0   traZODone  (DESYREL ) 50 MG tablet, Take 50 mg by mouth at bedtime., Disp: , Rfl:    traZODone  (DESYREL ) 50 MG tablet, TAKE 1 TO 2 TABLETS BY MOUTH AT BEDTIME, Disp: 180 tablet, Rfl: 1   traZODone  (DESYREL ) 50 MG tablet, Take 1-2 tablets (50-100 mg total) by mouth at bedtime., Disp: 180 tablet, Rfl: 1   WEGOVY  0.5 MG/0.5ML SOAJ, Inject 0.5 mg into the skin once a week for weight loss., Disp: 2 mL, Rfl: 0   WEGOVY  1 MG/0.5ML SOAJ, Inject 1 mg into the skin once a week for weight loss, Disp: 2 mL, Rfl: 0   WEGOVY  2.4 MG/0.75ML SOAJ, Inject 2.4 mg into the skin once a week for weight loss., Disp: 3 mL, Rfl: 0   WEGOVY  2.4 MG/0.75ML SOAJ, Inject 2.4 mg into the skin once a week fro weight loss, Disp: 3 mL, Rfl: 0   WEGOVY  2.4 MG/0.75ML SOAJ, Inject 2.4 mg into the skin once a week for weight loss, Disp: 3 mL, Rfl: 0   WEGOVY  2.4 MG/0.75ML SOAJ, Inject 2.4 mg,sub-q, into the skin once a week for weight loss, Disp: 3 mL, Rfl: 0   WEGOVY  2.4 MG/0.75ML SOAJ, Inject 2.4 mg into the skin once a week for weight loss., Disp: 3 mL, Rfl: 0   WEGOVY  2.4 MG/0.75ML  SOAJ, Inject 2.4 mg into the skin once a week for weight loss., Disp: 3 mL, Rfl: 0   WEGOVY  2.4 MG/0.75ML SOAJ, Inject 2.4 mg into the skin once a week for weight loss, Disp: 3 mL, Rfl: 0 Medication Side Effects: none  Family Medical/ Social History: Changes?   No  MENTAL HEALTH EXAM:  There were no vitals taken for this visit.There is no height or weight on file to calculate BMI.  General Appearance: Casual and Well Groomed  Eye Contact:  Good  Speech:  Clear and Coherent and Normal Rate  Volume:  Normal  Mood:  Euthymic  Affect:  Congruent  Thought Process:  Goal Directed and Descriptions of Associations: Circumstantial  Orientation:  Full (Time, Place, and Person)  Thought Content: Logical   Suicidal Thoughts:  No  Homicidal Thoughts:  No  Memory:  WNL  Judgement:  Good  Insight:  Good  Psychomotor Activity:  Normal  Concentration:  Concentration: Good and Attention Span: Good  Recall:  Good  Fund of Knowledge: Good  Language: Good  Assets:  Communication Skills Desire for Improvement Financial Resources/Insurance Housing Resilience Social Support Transportation Vocational/Educational  ADL's:  Intact  Cognition: WNL  Prognosis:  Good   ECT-MADRS    Flowsheet Row Clinical Support from 06/18/2024 in [facility] Video Visit from 03/20/2024 in [facility] Clinical Support from 01/23/2024 in [facility]  MADRS Total Score 3 20 18    Patient was administered Spravato  84 mg intranasally today.  The patient experienced the typical dissociation which gradually resolved over the 2-hour period of observation.  There were no complications.  Specifically the patient did not have nausea or vomiting or headache.  Blood pressure was slightly elavated prior to treatment so clonidine was given.  BP at the 40-minute mark was mildly elevated but returned to normal at the 2-hour BP after treatment.  By the  time the 2-hour observation period was met the patient was alert and oriented and able to exit without assistance.  Patient feels the Spravato  administration is helpful for the treatment resistant depression and would like to continue the treatment.  See nursing note for further details.  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 Situational anxiety  F41.8       Receiving Psychotherapy: No   RECOMMENDATIONS:   PDMP reviewed.  Spravato  filled 12/31/2023. Ativan  filled 06/18/2024.  I provided approximately 60 minutes of face to face time during this encounter, including time spent before and after the visit in records review, medical decision making, counseling pertinent to today's visit, and charting.   She is doing well on the current treatment so no changes need to be made.  If she is not able to get the Fetzima  tomorrow, contact our office and I will send in a small supply of Pristiq.  I do not want her to go without the antidepressant for very long.  Continue Wellbutrin  XL 150 mg daily. Continue Vraylar  1.5 mg, 1 p.o. daily. Continue clonidine 0.1 mg, 1 p.o. prior to Spravato  treatment each week per protocol prn. Continue Spravato  weekly. Continue Fetzima  120 mg, 1 p.o. daily. Continue Ativan  0.5 mg, 1/2-1 p.o. every 8 hours as needed anxiety. Continue trazodone  50 mg, 1-2 nightly as needed sleep. Return in 1 week.   Verneita Cooks, PA-C  "

## 2024-09-17 NOTE — Progress Notes (Signed)
 NURSES NOTE:   Pt arrived for her # 65 Spravato  Treatment for treatment resistant depression, the starting dose was 56 mg (2 of the 28 mg nasal sprays) pt stayed at that dose for the first 4 treatments and also takes Zofran  and Meclizine  prior to arrival at office. Pt received 84 mg (3 of the 28 mg nasal sprays) total again today and did also premedicate. Pt is now using her medical benefits and receives Spravato  through [place] and [place], today is the first time using it. Pt sees Angeline Sayers, NP and she will follow her throughout her treatments and follow ups. Spravato  medication is stored at treatment center per REMS/FDA guidelines. The medication is required to be locked behind two doors per REMS/FDA protocol. Medication is also disposed of properly after each use per regulations. All documentation for REMS is completed and submitted per FDA/REMS requirements.          Pt has had issues with getting her medication Fetzima , I did contact [facility] outpatient pharmacy and they are able to order it for her. She can get a 90 day supply for $22.00, will be in tomorrow. Pt directed to treatment room, started vitals at 10:15 AM, 150/92, pulse 77, SpO2 99%. Rechecked 150/100, due to stressors effecting her B/P she was given Clonidine 0.1 mg. Pt did take Zofran  4 mg and Meclizine  25 mg prior to arriving at office, an hour ahead. Instructed patient to blow her nose if needed then recline back to a 45 degree angle if that was more comfortable for her. Gave patient first dose 28 mg nasal spray, administered in each nostril as directed and observed by nurse, waited 5 more minutes for the second and third doses. After all doses given pt did not complain of nausea. pt keeps water and some mints to help with the taste of Spravato  it gives the metallic taste. Her 40 minute check was at 11:00 AM, vital signs were 156/97, pulse 77, SpO2 99%. Clonidine really didn't change her B/P, Verneita is aware. Pt aware she would be monitored for  a total time of 120 minutes.  Discharge vitals were taken at 12:02 PM 130/95 P 71, SpO2 99%. Pt met with Verneita Cooks, PA-C discuss her treatment.  Pt advised to relax the rest of the day. No driving, no intense activities. Verbalized understanding. Pt reports no issues today. Nurse was with pt a total of 60 minutes for clinical assessment.  Pt instructed to call office with any problems or questions prior to next treatment. She is scheduled next Monday, February 2nd due to needing assist her parents in their move.      (84 mg) LOT [id number] EXP Nov 19 2026

## 2024-09-17 NOTE — Telephone Encounter (Signed)
 Apparently the Fetzima  can be ordered by her pharmacy now.  I'll let pt know at her appt this AM.

## 2024-09-22 ENCOUNTER — Encounter

## 2024-09-22 ENCOUNTER — Encounter: Admitting: Physician Assistant
